# Patient Record
Sex: Female | Born: 1940 | Race: White | Hispanic: No | Smoking: Former smoker
Health system: Southern US, Community
[De-identification: ages and names within clinical notes are randomized; demographics above are authoritative.]

## PROBLEM LIST (undated history)

## (undated) DIAGNOSIS — H25019 Cortical age-related cataract, unspecified eye: Secondary | ICD-10-CM

## (undated) DIAGNOSIS — K648 Other hemorrhoids: Secondary | ICD-10-CM

## (undated) DIAGNOSIS — D126 Benign neoplasm of colon, unspecified: Secondary | ICD-10-CM

## (undated) DIAGNOSIS — G2581 Restless legs syndrome: Secondary | ICD-10-CM

## (undated) DIAGNOSIS — R634 Abnormal weight loss: Secondary | ICD-10-CM

## (undated) DIAGNOSIS — I700 Atherosclerosis of aorta: Secondary | ICD-10-CM

## (undated) DIAGNOSIS — E538 Deficiency of other specified B group vitamins: Secondary | ICD-10-CM

## (undated) DIAGNOSIS — I5022 Chronic systolic (congestive) heart failure: Secondary | ICD-10-CM

## (undated) DIAGNOSIS — I499 Cardiac arrhythmia, unspecified: Secondary | ICD-10-CM

## (undated) DIAGNOSIS — M4696 Unspecified inflammatory spondylopathy, lumbar region: Secondary | ICD-10-CM

## (undated) DIAGNOSIS — N6099 Unspecified benign mammary dysplasia of unspecified breast: Secondary | ICD-10-CM

## (undated) DIAGNOSIS — D509 Iron deficiency anemia, unspecified: Secondary | ICD-10-CM

## (undated) DIAGNOSIS — K810 Acute cholecystitis: Secondary | ICD-10-CM

## (undated) DIAGNOSIS — I2510 Atherosclerotic heart disease of native coronary artery without angina pectoris: Secondary | ICD-10-CM

## (undated) DIAGNOSIS — K219 Gastro-esophageal reflux disease without esophagitis: Secondary | ICD-10-CM

## (undated) DIAGNOSIS — K279 Peptic ulcer, site unspecified, unspecified as acute or chronic, without hemorrhage or perforation: Secondary | ICD-10-CM

## (undated) DIAGNOSIS — K921 Melena: Secondary | ICD-10-CM

## (undated) DIAGNOSIS — J309 Allergic rhinitis, unspecified: Secondary | ICD-10-CM

## (undated) DIAGNOSIS — I10 Essential (primary) hypertension: Secondary | ICD-10-CM

## (undated) DIAGNOSIS — I38 Endocarditis, valve unspecified: Secondary | ICD-10-CM

## (undated) DIAGNOSIS — M48062 Spinal stenosis, lumbar region with neurogenic claudication: Secondary | ICD-10-CM

## (undated) DIAGNOSIS — Z9981 Dependence on supplemental oxygen: Secondary | ICD-10-CM

## (undated) DIAGNOSIS — F419 Anxiety disorder, unspecified: Secondary | ICD-10-CM

## (undated) DIAGNOSIS — J449 Chronic obstructive pulmonary disease, unspecified: Secondary | ICD-10-CM

## (undated) DIAGNOSIS — M4184 Other forms of scoliosis, thoracic region: Secondary | ICD-10-CM

## (undated) DIAGNOSIS — E785 Hyperlipidemia, unspecified: Secondary | ICD-10-CM

## (undated) DIAGNOSIS — Z9889 Other specified postprocedural states: Secondary | ICD-10-CM

## (undated) DIAGNOSIS — I420 Dilated cardiomyopathy: Secondary | ICD-10-CM

## (undated) DIAGNOSIS — F32A Depression, unspecified: Secondary | ICD-10-CM

## (undated) DIAGNOSIS — M1990 Unspecified osteoarthritis, unspecified site: Secondary | ICD-10-CM

## (undated) DIAGNOSIS — R0600 Dyspnea, unspecified: Secondary | ICD-10-CM

## (undated) DIAGNOSIS — M51369 Other intervertebral disc degeneration, lumbar region without mention of lumbar back pain or lower extremity pain: Secondary | ICD-10-CM

## (undated) DIAGNOSIS — J432 Centrilobular emphysema: Secondary | ICD-10-CM

## (undated) DIAGNOSIS — M5136 Other intervertebral disc degeneration, lumbar region: Secondary | ICD-10-CM

## (undated) DIAGNOSIS — R197 Diarrhea, unspecified: Secondary | ICD-10-CM

## (undated) DIAGNOSIS — R911 Solitary pulmonary nodule: Secondary | ICD-10-CM

## (undated) DIAGNOSIS — M75100 Unspecified rotator cuff tear or rupture of unspecified shoulder, not specified as traumatic: Secondary | ICD-10-CM

## (undated) DIAGNOSIS — I447 Left bundle-branch block, unspecified: Secondary | ICD-10-CM

## (undated) DIAGNOSIS — Z955 Presence of coronary angioplasty implant and graft: Secondary | ICD-10-CM

## (undated) DIAGNOSIS — Z87891 Personal history of nicotine dependence: Secondary | ICD-10-CM

## (undated) DIAGNOSIS — K635 Polyp of colon: Secondary | ICD-10-CM

## (undated) DIAGNOSIS — M5416 Radiculopathy, lumbar region: Secondary | ICD-10-CM

## (undated) DIAGNOSIS — R7989 Other specified abnormal findings of blood chemistry: Secondary | ICD-10-CM

## (undated) HISTORY — DX: Other intervertebral disc degeneration, lumbar region: M51.36

## (undated) HISTORY — DX: Restless legs syndrome: G25.81

## (undated) HISTORY — DX: Centrilobular emphysema: J43.2

## (undated) HISTORY — DX: Melena: K92.1

## (undated) HISTORY — DX: Hyperlipidemia, unspecified: E78.5

## (undated) HISTORY — DX: Other intervertebral disc degeneration, lumbar region without mention of lumbar back pain or lower extremity pain: M51.369

## (undated) HISTORY — DX: Allergic rhinitis, unspecified: J30.9

## (undated) HISTORY — DX: Atherosclerotic heart disease of native coronary artery without angina pectoris: I25.10

## (undated) HISTORY — DX: Spinal stenosis, lumbar region with neurogenic claudication: M48.062

## (undated) HISTORY — DX: Iron deficiency anemia, unspecified: D50.9

## (undated) HISTORY — PX: COLECTOMY: SHX59

## (undated) HISTORY — DX: Other specified abnormal findings of blood chemistry: R79.89

## (undated) HISTORY — DX: Diarrhea, unspecified: R19.7

## (undated) HISTORY — DX: Radiculopathy, lumbar region: M54.16

## (undated) HISTORY — DX: Presence of coronary angioplasty implant and graft: Z95.5

## (undated) HISTORY — DX: Polyp of colon: K63.5

## (undated) HISTORY — DX: Chronic systolic (congestive) heart failure: I50.22

## (undated) HISTORY — DX: Peptic ulcer, site unspecified, unspecified as acute or chronic, without hemorrhage or perforation: K27.9

## (undated) HISTORY — PX: APPENDECTOMY: SHX54

## (undated) HISTORY — DX: Unspecified inflammatory spondylopathy, lumbar region: M46.96

## (undated) HISTORY — DX: Endocarditis, valve unspecified: I38

## (undated) HISTORY — DX: Unspecified benign mammary dysplasia of unspecified breast: N60.99

## (undated) HISTORY — PX: CORONARY ANGIOPLASTY WITH STENT PLACEMENT: SHX49

## (undated) HISTORY — DX: Unspecified rotator cuff tear or rupture of unspecified shoulder, not specified as traumatic: M75.100

## (undated) HISTORY — PX: TONSILLECTOMY: SUR1361

## (undated) HISTORY — PX: ABDOMINAL HYSTERECTOMY: SHX81

## (undated) HISTORY — DX: Essential (primary) hypertension: I10

---

## 2004-03-16 ENCOUNTER — Ambulatory Visit: Payer: Self-pay

## 2005-05-23 ENCOUNTER — Ambulatory Visit: Payer: Self-pay | Admitting: Family Medicine

## 2005-10-30 ENCOUNTER — Ambulatory Visit: Payer: Self-pay | Admitting: Ophthalmology

## 2005-12-18 ENCOUNTER — Ambulatory Visit: Payer: Self-pay | Admitting: Ophthalmology

## 2006-03-05 ENCOUNTER — Ambulatory Visit: Payer: Self-pay | Admitting: Unknown Physician Specialty

## 2006-05-23 ENCOUNTER — Ambulatory Visit: Payer: Self-pay | Admitting: Unknown Physician Specialty

## 2006-07-19 ENCOUNTER — Ambulatory Visit: Payer: Self-pay | Admitting: Internal Medicine

## 2007-07-30 ENCOUNTER — Ambulatory Visit: Payer: Self-pay | Admitting: Internal Medicine

## 2007-08-14 ENCOUNTER — Ambulatory Visit: Payer: Self-pay | Admitting: Unknown Physician Specialty

## 2008-03-23 ENCOUNTER — Ambulatory Visit: Payer: Self-pay

## 2008-06-09 DIAGNOSIS — Z955 Presence of coronary angioplasty implant and graft: Secondary | ICD-10-CM

## 2008-06-09 HISTORY — DX: Presence of coronary angioplasty implant and graft: Z95.5

## 2008-07-06 ENCOUNTER — Inpatient Hospital Stay: Payer: Self-pay | Admitting: Cardiology

## 2008-07-06 HISTORY — PX: CORONARY ANGIOPLASTY WITH STENT PLACEMENT: SHX49

## 2008-09-15 ENCOUNTER — Ambulatory Visit: Payer: Self-pay | Admitting: Internal Medicine

## 2008-09-24 ENCOUNTER — Ambulatory Visit: Payer: Self-pay | Admitting: Internal Medicine

## 2008-10-21 ENCOUNTER — Ambulatory Visit: Payer: Self-pay | Admitting: Unknown Physician Specialty

## 2008-10-30 ENCOUNTER — Ambulatory Visit: Payer: Self-pay | Admitting: Unknown Physician Specialty

## 2008-11-03 ENCOUNTER — Emergency Department: Payer: Self-pay | Admitting: Emergency Medicine

## 2008-11-19 ENCOUNTER — Encounter: Payer: Self-pay | Admitting: Physician Assistant

## 2008-12-09 ENCOUNTER — Encounter: Payer: Self-pay | Admitting: Physician Assistant

## 2009-01-09 ENCOUNTER — Encounter: Payer: Self-pay | Admitting: Physician Assistant

## 2009-02-09 ENCOUNTER — Encounter: Payer: Self-pay | Admitting: Physician Assistant

## 2009-12-14 ENCOUNTER — Ambulatory Visit: Payer: Self-pay | Admitting: Internal Medicine

## 2009-12-15 ENCOUNTER — Ambulatory Visit: Payer: Self-pay | Admitting: Otolaryngology

## 2010-01-31 ENCOUNTER — Encounter: Payer: Self-pay | Admitting: Otolaryngology

## 2010-02-09 ENCOUNTER — Encounter: Payer: Self-pay | Admitting: Otolaryngology

## 2010-11-14 ENCOUNTER — Emergency Department: Payer: Self-pay | Admitting: Emergency Medicine

## 2011-01-10 HISTORY — PX: PARTIAL COLECTOMY: SHX5273

## 2011-05-15 ENCOUNTER — Ambulatory Visit: Payer: Self-pay | Admitting: Unknown Physician Specialty

## 2011-05-17 LAB — PATHOLOGY REPORT

## 2011-06-28 ENCOUNTER — Ambulatory Visit: Payer: Self-pay | Admitting: Surgery

## 2011-06-28 LAB — CBC
HGB: 14 g/dL (ref 12.0–16.0)
MCHC: 33.5 g/dL (ref 32.0–36.0)
MCV: 88 fL (ref 80–100)

## 2011-06-28 LAB — BASIC METABOLIC PANEL
BUN: 16 mg/dL (ref 7–18)
Chloride: 106 mmol/L (ref 98–107)
Co2: 28 mmol/L (ref 21–32)
Creatinine: 0.91 mg/dL (ref 0.60–1.30)
EGFR (African American): >60
Glucose: 107 mg/dL — ABNORMAL HIGH (ref 65–99)
Osmolality: 285 (ref 275–301)
Sodium: 142 mmol/L (ref 136–145)

## 2011-07-07 ENCOUNTER — Inpatient Hospital Stay: Payer: Self-pay | Admitting: Surgery

## 2011-07-08 LAB — BASIC METABOLIC PANEL
Anion Gap: 7 (ref 7–16)
BUN: 9 mg/dL (ref 7–18)
Calcium, Total: 8.4 mg/dL — ABNORMAL LOW (ref 8.5–10.1)
Co2: 27 mmol/L (ref 21–32)
Creatinine: 0.87 mg/dL (ref 0.60–1.30)
EGFR (African American): >60
EGFR (Non-African Amer.): >60
Glucose: 137 mg/dL — ABNORMAL HIGH (ref 65–99)
Potassium: 4.1 mmol/L (ref 3.5–5.1)
Sodium: 135 mmol/L — ABNORMAL LOW (ref 136–145)

## 2011-07-08 LAB — CBC WITH DIFFERENTIAL/PLATELET
Eosinophil #: 0 10*3/uL (ref 0.0–0.7)
Eosinophil %: 0 %
HCT: 40.1 % (ref 35.0–47.0)
Lymphocyte #: 1.6 10*3/uL (ref 1.0–3.6)
Lymphocyte %: 17 %
MCH: 29.6 pg (ref 26.0–34.0)
MCV: 88 fL (ref 80–100)
Monocyte #: 1 x10 3/mm — ABNORMAL HIGH (ref 0.2–0.9)
Neutrophil %: 72.2 %
Platelet: 220 10*3/uL (ref 150–440)
RBC: 4.58 10*6/uL (ref 3.80–5.20)
WBC: 9.2 10*3/uL (ref 3.6–11.0)

## 2011-07-11 LAB — BASIC METABOLIC PANEL
Anion Gap: 7 (ref 7–16)
BUN: 17 mg/dL (ref 7–18)
Co2: 26 mmol/L (ref 21–32)
EGFR (African American): 58 — ABNORMAL LOW
EGFR (Non-African Amer.): 50 — ABNORMAL LOW
Glucose: 112 mg/dL — ABNORMAL HIGH (ref 65–99)
Osmolality: 263 (ref 275–301)
Potassium: 5 mmol/L (ref 3.5–5.1)
Sodium: 130 mmol/L — ABNORMAL LOW (ref 136–145)

## 2011-07-11 LAB — CBC WITH DIFFERENTIAL/PLATELET
Basophil #: 0.1 10*3/uL (ref 0.0–0.1)
Eosinophil %: 0.9 %
HCT: 46.5 % (ref 35.0–47.0)
HGB: 15.4 g/dL (ref 12.0–16.0)
Lymphocyte %: 17 %
MCH: 28.8 pg (ref 26.0–34.0)
MCHC: 33 g/dL (ref 32.0–36.0)
MCV: 87 fL (ref 80–100)
Monocyte %: 8.2 %
Neutrophil #: 8.2 10*3/uL — ABNORMAL HIGH (ref 1.4–6.5)
Neutrophil %: 73.4 %
Platelet: 340 10*3/uL (ref 150–440)
RDW: 13.4 % (ref 11.5–14.5)

## 2011-07-11 LAB — PATHOLOGY REPORT

## 2011-07-14 LAB — BASIC METABOLIC PANEL
Anion Gap: 5 — ABNORMAL LOW (ref 7–16)
BUN: 14 mg/dL (ref 7–18)
Chloride: 98 mmol/L (ref 98–107)
Co2: 31 mmol/L (ref 21–32)
Creatinine: 0.87 mg/dL (ref 0.60–1.30)
Glucose: 121 mg/dL — ABNORMAL HIGH (ref 65–99)
Sodium: 134 mmol/L — ABNORMAL LOW (ref 136–145)

## 2011-07-15 LAB — CBC WITH DIFFERENTIAL/PLATELET
Basophil #: 0 10*3/uL (ref 0.0–0.1)
Basophil %: 0.1 %
Eosinophil %: 1.7 %
HGB: 11.9 g/dL — ABNORMAL LOW (ref 12.0–16.0)
Lymphocyte #: 1.5 10*3/uL (ref 1.0–3.6)
Lymphocyte %: 21.1 %
MCH: 29.5 pg (ref 26.0–34.0)
MCV: 87 fL (ref 80–100)
Monocyte %: 9.7 %
Neutrophil %: 67.4 %
Platelet: 302 10*3/uL (ref 150–440)
RBC: 4.05 10*6/uL (ref 3.80–5.20)
WBC: 7.1 10*3/uL (ref 3.6–11.0)

## 2011-07-17 LAB — POTASSIUM: Potassium: 3.9 mmol/L (ref 3.5–5.1)

## 2011-10-12 ENCOUNTER — Ambulatory Visit: Payer: Self-pay | Admitting: Internal Medicine

## 2011-10-24 ENCOUNTER — Ambulatory Visit: Payer: Self-pay | Admitting: Internal Medicine

## 2011-11-20 ENCOUNTER — Ambulatory Visit: Payer: Self-pay | Admitting: Surgery

## 2011-11-20 HISTORY — PX: BREAST BIOPSY: SHX20

## 2012-05-20 ENCOUNTER — Ambulatory Visit: Payer: Self-pay | Admitting: Surgery

## 2013-05-13 ENCOUNTER — Inpatient Hospital Stay: Payer: Self-pay | Admitting: Internal Medicine

## 2013-05-14 LAB — CBC WITH DIFFERENTIAL/PLATELET
BASOS PCT: 1 %
Basophil #: 0.1 10*3/uL (ref 0.0–0.1)
EOS ABS: 0.1 10*3/uL (ref 0.0–0.7)
Eosinophil %: 1.2 %
HCT: 29.4 % — ABNORMAL LOW (ref 35.0–47.0)
HGB: 9.4 g/dL — ABNORMAL LOW (ref 12.0–16.0)
LYMPHS ABS: 2.4 10*3/uL (ref 1.0–3.6)
LYMPHS PCT: 40.2 %
MCH: 20.9 pg — AB (ref 26.0–34.0)
MCHC: 31.9 g/dL — ABNORMAL LOW (ref 32.0–36.0)
MCV: 65 fL — ABNORMAL LOW (ref 80–100)
MONOS PCT: 11.6 %
Monocyte #: 0.7 x10 3/mm (ref 0.2–0.9)
NEUTROS PCT: 46 %
Neutrophil #: 2.8 10*3/uL (ref 1.4–6.5)
PLATELETS: 325 10*3/uL (ref 150–440)
RBC: 4.5 10*6/uL (ref 3.80–5.20)
RDW: 28.4 % — ABNORMAL HIGH (ref 11.5–14.5)
WBC: 6.1 10*3/uL (ref 3.6–11.0)

## 2013-05-14 LAB — COMPREHENSIVE METABOLIC PANEL
ANION GAP: 7 (ref 7–16)
AST: 22 U/L (ref 15–37)
Albumin: 3.7 g/dL (ref 3.4–5.0)
Alkaline Phosphatase: 83 U/L
BUN: 10 mg/dL (ref 7–18)
Bilirubin,Total: 1.2 mg/dL — ABNORMAL HIGH (ref 0.2–1.0)
CHLORIDE: 112 mmol/L — AB (ref 98–107)
CO2: 23 mmol/L (ref 21–32)
CREATININE: 0.95 mg/dL (ref 0.60–1.30)
Calcium, Total: 8.9 mg/dL (ref 8.5–10.1)
EGFR (African American): >60
EGFR (Non-African Amer.): 60 — ABNORMAL LOW
Glucose: 94 mg/dL (ref 65–99)
OSMOLALITY: 282 (ref 275–301)
Potassium: 3.7 mmol/L (ref 3.5–5.1)
SGPT (ALT): 19 U/L (ref 12–78)
Sodium: 142 mmol/L (ref 136–145)
Total Protein: 6.7 g/dL (ref 6.4–8.2)

## 2013-05-15 LAB — CBC WITH DIFFERENTIAL/PLATELET
Basophil #: 0.1 10*3/uL (ref 0.0–0.1)
Basophil %: 1 %
Eosinophil #: 0.1 10*3/uL (ref 0.0–0.7)
Eosinophil %: 2.1 %
HCT: 30.5 % — ABNORMAL LOW (ref 35.0–47.0)
HGB: 9.2 g/dL — ABNORMAL LOW (ref 12.0–16.0)
LYMPHS ABS: 2 10*3/uL (ref 1.0–3.6)
LYMPHS PCT: 30.8 %
MCH: 20 pg — ABNORMAL LOW (ref 26.0–34.0)
MCHC: 30.3 g/dL — AB (ref 32.0–36.0)
MCV: 66 fL — AB (ref 80–100)
MONOS PCT: 13.2 %
Monocyte #: 0.8 x10 3/mm (ref 0.2–0.9)
NEUTROS ABS: 3.4 10*3/uL (ref 1.4–6.5)
NEUTROS PCT: 52.9 %
Platelet: 305 10*3/uL (ref 150–440)
RBC: 4.62 10*6/uL (ref 3.80–5.20)
RDW: 28.9 % — AB (ref 11.5–14.5)
WBC: 6.4 10*3/uL (ref 3.6–11.0)

## 2013-05-16 LAB — CBC WITH DIFFERENTIAL/PLATELET
BASOS ABS: 0 10*3/uL (ref 0.0–0.1)
Basophil %: 0.9 %
EOS ABS: 0.1 10*3/uL (ref 0.0–0.7)
EOS PCT: 2.6 %
HCT: 31.1 % — ABNORMAL LOW (ref 35.0–47.0)
HGB: 9.5 g/dL — ABNORMAL LOW (ref 12.0–16.0)
LYMPHS PCT: 33 %
Lymphocyte #: 1.7 10*3/uL (ref 1.0–3.6)
MCH: 20.4 pg — ABNORMAL LOW (ref 26.0–34.0)
MCHC: 30.7 g/dL — ABNORMAL LOW (ref 32.0–36.0)
MCV: 66 fL — ABNORMAL LOW (ref 80–100)
Monocyte #: 0.6 x10 3/mm (ref 0.2–0.9)
Monocyte %: 12.5 %
NEUTROS ABS: 2.6 10*3/uL (ref 1.4–6.5)
NEUTROS PCT: 51 %
Platelet: 293 10*3/uL (ref 150–440)
RBC: 4.68 10*6/uL (ref 3.80–5.20)
RDW: 29.5 % — ABNORMAL HIGH (ref 11.5–14.5)
WBC: 5 10*3/uL (ref 3.6–11.0)

## 2013-07-04 ENCOUNTER — Ambulatory Visit: Payer: Self-pay | Admitting: Oncology

## 2013-07-04 LAB — CBC CANCER CENTER
BASOS ABS: 0.1 x10 3/mm (ref 0.0–0.1)
Basophil %: 0.8 %
EOS PCT: 0.7 %
Eosinophil #: 0.1 x10 3/mm (ref 0.0–0.7)
HCT: 39.1 % (ref 35.0–47.0)
HGB: 12.4 g/dL (ref 12.0–16.0)
LYMPHS ABS: 1.9 x10 3/mm (ref 1.0–3.6)
Lymphocyte %: 25.7 %
MCH: 24.9 pg — AB (ref 26.0–34.0)
MCHC: 31.7 g/dL — ABNORMAL LOW (ref 32.0–36.0)
MCV: 79 fL — AB (ref 80–100)
MONOS PCT: 7.8 %
Monocyte #: 0.6 x10 3/mm (ref 0.2–0.9)
NEUTROS ABS: 4.9 x10 3/mm (ref 1.4–6.5)
Neutrophil %: 65 %
Platelet: 278 x10 3/mm (ref 150–440)
RBC: 4.98 10*6/uL (ref 3.80–5.20)
RDW: 28.1 % — ABNORMAL HIGH (ref 11.5–14.5)
WBC: 7.6 x10 3/mm (ref 3.6–11.0)

## 2013-07-04 LAB — COMPREHENSIVE METABOLIC PANEL
ALK PHOS: 84 U/L
AST: 13 U/L — AB (ref 15–37)
Albumin: 4.3 g/dL (ref 3.4–5.0)
Anion Gap: 9 (ref 7–16)
BUN: 14 mg/dL (ref 7–18)
Bilirubin,Total: 0.4 mg/dL (ref 0.2–1.0)
CREATININE: 0.86 mg/dL (ref 0.60–1.30)
Calcium, Total: 9.3 mg/dL (ref 8.5–10.1)
Chloride: 105 mmol/L (ref 98–107)
Co2: 27 mmol/L (ref 21–32)
EGFR (African American): >60
Glucose: 98 mg/dL (ref 65–99)
OSMOLALITY: 282 (ref 275–301)
Potassium: 4.5 mmol/L (ref 3.5–5.1)
SGPT (ALT): 24 U/L (ref 12–78)
Sodium: 141 mmol/L (ref 136–145)
TOTAL PROTEIN: 7.5 g/dL (ref 6.4–8.2)

## 2013-07-04 LAB — RETICULOCYTES
Absolute Retic Count: 0.0604 10*6/uL (ref 0.019–0.186)
Reticulocyte: 1.21 % (ref 0.4–3.1)

## 2013-07-04 LAB — IRON AND TIBC
IRON SATURATION: 10 %
Iron Bind.Cap.(Total): 464 ug/dL — ABNORMAL HIGH (ref 250–450)
Iron: 48 ug/dL — ABNORMAL LOW (ref 50–170)
Unbound Iron-Bind.Cap.: 416 ug/dL

## 2013-07-04 LAB — LACTATE DEHYDROGENASE: LDH: 180 U/L (ref 81–246)

## 2013-07-09 ENCOUNTER — Ambulatory Visit: Payer: Self-pay | Admitting: Oncology

## 2013-08-09 ENCOUNTER — Ambulatory Visit: Payer: Self-pay | Admitting: Oncology

## 2013-08-20 LAB — CBC CANCER CENTER
Basophil #: 0 x10 3/mm (ref 0.0–0.1)
Basophil %: 0.6 %
EOS ABS: 0.1 x10 3/mm (ref 0.0–0.7)
Eosinophil %: 1.4 %
HCT: 39.8 % (ref 35.0–47.0)
HGB: 12.7 g/dL (ref 12.0–16.0)
LYMPHS ABS: 2.3 x10 3/mm (ref 1.0–3.6)
Lymphocyte %: 41.4 %
MCH: 26.3 pg (ref 26.0–34.0)
MCHC: 31.8 g/dL — ABNORMAL LOW (ref 32.0–36.0)
MCV: 83 fL (ref 80–100)
MONO ABS: 0.4 x10 3/mm (ref 0.2–0.9)
Monocyte %: 7.7 %
Neutrophil #: 2.8 x10 3/mm (ref 1.4–6.5)
Neutrophil %: 48.9 %
Platelet: 214 x10 3/mm (ref 150–440)
RBC: 4.82 10*6/uL (ref 3.80–5.20)
RDW: 15.4 % — ABNORMAL HIGH (ref 11.5–14.5)
WBC: 5.6 x10 3/mm (ref 3.6–11.0)

## 2013-09-09 ENCOUNTER — Ambulatory Visit: Payer: Self-pay | Admitting: Oncology

## 2013-09-18 ENCOUNTER — Ambulatory Visit: Payer: Self-pay | Admitting: Internal Medicine

## 2013-10-27 ENCOUNTER — Inpatient Hospital Stay: Payer: Self-pay | Admitting: Internal Medicine

## 2013-10-27 LAB — COMPREHENSIVE METABOLIC PANEL
ALT: 27 U/L
Albumin: 3.7 g/dL (ref 3.4–5.0)
Alkaline Phosphatase: 87 U/L
Anion Gap: 11 (ref 7–16)
BILIRUBIN TOTAL: 0.5 mg/dL (ref 0.2–1.0)
BUN: 15 mg/dL (ref 7–18)
CHLORIDE: 106 mmol/L (ref 98–107)
Calcium, Total: 8.2 mg/dL — ABNORMAL LOW (ref 8.5–10.1)
Co2: 19 mmol/L — ABNORMAL LOW (ref 21–32)
Creatinine: 1.18 mg/dL (ref 0.60–1.30)
Glucose: 308 mg/dL — ABNORMAL HIGH (ref 65–99)
Osmolality: 284 (ref 275–301)
Potassium: 4.4 mmol/L (ref 3.5–5.1)
SGOT(AST): 24 U/L (ref 15–37)
Sodium: 136 mmol/L (ref 136–145)
Total Protein: 6.5 g/dL (ref 6.4–8.2)

## 2013-10-27 LAB — CBC
HCT: 40.2 % (ref 35.0–47.0)
HGB: 12.4 g/dL (ref 12.0–16.0)
MCH: 25.6 pg — AB (ref 26.0–34.0)
MCHC: 30.8 g/dL — ABNORMAL LOW (ref 32.0–36.0)
MCV: 83 fL (ref 80–100)
PLATELETS: 290 10*3/uL (ref 150–440)
RBC: 4.83 10*6/uL (ref 3.80–5.20)
RDW: 15.9 % — ABNORMAL HIGH (ref 11.5–14.5)
WBC: 11.5 10*3/uL — ABNORMAL HIGH (ref 3.6–11.0)

## 2013-10-27 LAB — HEMOGLOBIN A1C: HEMOGLOBIN A1C: 5.9 % (ref 4.2–6.3)

## 2013-10-27 LAB — TROPONIN I: Troponin-I: 0.02 ng/mL

## 2013-10-29 LAB — CBC WITH DIFFERENTIAL/PLATELET
Basophil #: 0 10*3/uL (ref 0.0–0.1)
Basophil %: 0.1 %
Eosinophil #: 0 10*3/uL (ref 0.0–0.7)
Eosinophil %: 0 %
HCT: 36.4 % (ref 35.0–47.0)
HGB: 11.7 g/dL — AB (ref 12.0–16.0)
Lymphocyte #: 1.1 10*3/uL (ref 1.0–3.6)
Lymphocyte %: 12 %
MCH: 26.2 pg (ref 26.0–34.0)
MCHC: 32.1 g/dL (ref 32.0–36.0)
MCV: 82 fL (ref 80–100)
MONO ABS: 0.5 x10 3/mm (ref 0.2–0.9)
Monocyte %: 4.9 %
NEUTROS ABS: 7.8 10*3/uL — AB (ref 1.4–6.5)
NEUTROS PCT: 83 %
Platelet: 215 10*3/uL (ref 150–440)
RBC: 4.46 10*6/uL (ref 3.80–5.20)
RDW: 15.7 % — ABNORMAL HIGH (ref 11.5–14.5)
WBC: 9.4 10*3/uL (ref 3.6–11.0)

## 2013-10-29 LAB — BASIC METABOLIC PANEL
Anion Gap: 6 — ABNORMAL LOW (ref 7–16)
BUN: 22 mg/dL — AB (ref 7–18)
CALCIUM: 8.5 mg/dL (ref 8.5–10.1)
CREATININE: 0.87 mg/dL (ref 0.60–1.30)
Chloride: 109 mmol/L — ABNORMAL HIGH (ref 98–107)
Co2: 25 mmol/L (ref 21–32)
EGFR (African American): >60
GLUCOSE: 135 mg/dL — AB (ref 65–99)
Osmolality: 285 (ref 275–301)
Potassium: 4.2 mmol/L (ref 3.5–5.1)
Sodium: 140 mmol/L (ref 136–145)

## 2013-10-30 LAB — CBC WITH DIFFERENTIAL/PLATELET
Basophil #: 0 10*3/uL (ref 0.0–0.1)
Basophil %: 0 %
EOS ABS: 0 10*3/uL (ref 0.0–0.7)
Eosinophil %: 0 %
HCT: 38.6 % (ref 35.0–47.0)
HGB: 12.5 g/dL (ref 12.0–16.0)
Lymphocyte #: 1 10*3/uL (ref 1.0–3.6)
Lymphocyte %: 12.2 %
MCH: 26.5 pg (ref 26.0–34.0)
MCHC: 32.5 g/dL (ref 32.0–36.0)
MCV: 82 fL (ref 80–100)
MONO ABS: 0.2 x10 3/mm (ref 0.2–0.9)
Monocyte %: 2.5 %
Neutrophil #: 7.2 10*3/uL — ABNORMAL HIGH (ref 1.4–6.5)
Neutrophil %: 85.3 %
Platelet: 235 10*3/uL (ref 150–440)
RBC: 4.72 10*6/uL (ref 3.80–5.20)
RDW: 15.9 % — ABNORMAL HIGH (ref 11.5–14.5)
WBC: 8.4 10*3/uL (ref 3.6–11.0)

## 2013-10-30 LAB — BASIC METABOLIC PANEL
Anion Gap: 7 (ref 7–16)
BUN: 22 mg/dL — AB (ref 7–18)
CHLORIDE: 110 mmol/L — AB (ref 98–107)
CO2: 25 mmol/L (ref 21–32)
Calcium, Total: 8.7 mg/dL (ref 8.5–10.1)
Creatinine: 0.9 mg/dL (ref 0.60–1.30)
EGFR (Non-African Amer.): >60
Glucose: 137 mg/dL — ABNORMAL HIGH (ref 65–99)
OSMOLALITY: 289 (ref 275–301)
POTASSIUM: 4.3 mmol/L (ref 3.5–5.1)
Sodium: 142 mmol/L (ref 136–145)

## 2014-02-09 DIAGNOSIS — E538 Deficiency of other specified B group vitamins: Secondary | ICD-10-CM | POA: Insufficient documentation

## 2014-04-01 ENCOUNTER — Emergency Department: Payer: Self-pay | Admitting: Emergency Medicine

## 2014-04-01 DIAGNOSIS — R911 Solitary pulmonary nodule: Secondary | ICD-10-CM | POA: Insufficient documentation

## 2014-04-01 LAB — CBC
HCT: 35.5 % (ref 35.0–47.0)
HGB: 10.9 g/dL — ABNORMAL LOW (ref 12.0–16.0)
MCH: 23 pg — ABNORMAL LOW (ref 26.0–34.0)
MCHC: 30.7 g/dL — AB (ref 32.0–36.0)
MCV: 75 fL — ABNORMAL LOW (ref 80–100)
Platelet: 334 10*3/uL (ref 150–440)
RBC: 4.75 10*6/uL (ref 3.80–5.20)
RDW: 16.3 % — ABNORMAL HIGH (ref 11.5–14.5)
WBC: 7.4 10*3/uL (ref 3.6–11.0)

## 2014-04-01 LAB — URINALYSIS, COMPLETE
BLOOD: NEGATIVE
Bacteria: NONE SEEN
Bilirubin,UR: NEGATIVE
Glucose,UR: NEGATIVE mg/dL (ref 0–75)
Ketone: NEGATIVE
LEUKOCYTE ESTERASE: NEGATIVE
NITRITE: NEGATIVE
PH: 8 (ref 4.5–8.0)
Protein: NEGATIVE
RBC,UR: NONE SEEN /HPF (ref 0–5)
SPECIFIC GRAVITY: 1.003 (ref 1.003–1.030)
SQUAMOUS EPITHELIAL: NONE SEEN
WBC UR: <1 /HPF (ref 0–5)

## 2014-04-28 NOTE — Discharge Summary (Signed)
PATIENT NAME:  [name], Tanya Rhodes MR#:  [id number] DATE OF BIRTH:  [birth year]  DATE OF ADMISSION:  07/07/2011 DATE OF DISCHARGE:  07/18/2011  HISTORY OF PRESENT ILLNESS: This 74 year old female came in due to colonic polyps. She had recently had colonoscopy demonstrating a tubular adenomas of the cecum, hepatic flexure and splenic flexure which could not be resected with the colonoscope and resection of these segments of the colon was recommended for definitive treatment.   PAST MEDICAL HISTORY:  1. Chronic obstructive pulmonary disease. 2. History of coronary artery disease. 3. Hypercholesterolemia. 4. Restless legs. 5. Allergic rhinitis.   NOTE: Other details of the history recorded on the typed history and physical.   HOSPITAL COURSE: She was admitted through the outpatient surgery department, had a laparoscopic resection of the colon from the cecum to the descending colon. He did have preop prophylactic antibiotic, was treated with subcutaneous heparin prophylaxis and was treated postoperatively also with IV fluids. She was begun with a clear liquid diet and later advanced to full liquids and did demonstrate some bowel function and was advanced to solid food.   Her pathology demonstrated five tubular adenomas, the largest measured 1.5 cm in dimension. All of these were benign.   It is further noted during the course of hospitalization she did develop watery diarrhea and stool sample was sent and was positive for Clostridium difficile. She was treated initially with Flagyl but did not appear to substantially improve and therefore was subsequently treated with vancomycin which she improved fairly promptly with the vancomycin.   FINAL DIAGNOSES:  1. Five tubular adenomas of the colon.  2. Clostridium difficile colitis.   OPERATION: Laparoscopic resection of right transverse and descending colon.   DISCHARGE INSTRUCTIONS: Discharge instructions were given. She will return home. Take Tylenol  if needed. Take vancomycin 250 mg take 4 daily. Follow up in the office.   ____________________________ J. Renda RollsWilton Lark Runk, MD jws:cms D: 07/29/2011 13:53:37 ET T: 07/31/2011 08:25:18 ET JOB#: [id number]  cc: Adella HareJ. Wilton Garison Genova, MD, <Dictator>  Adella HareWILTON J Pearl Berlinger MD ELECTRONICALLY SIGNED 08/02/2011 7:38

## 2014-05-02 NOTE — Consult Note (Signed)
Details:   - EGD and Colon done today.    Both were normal studies ( IC anastaomis of colon).  No source for bleeding seen.   Will place capusle endoscopy to visualize small bowel.   Recs: ferrous sulfate 325 BID follow hgb, iron studies as outpatient.   Electronic Signatures: Dow Adolphein, Candelaria Pies (MD)  (Signed 07-May-15 12:25)  Authored: Details   Last Updated: 07-May-15 12:25 by Dow Adolphein, Taura Lamarre (MD)

## 2014-05-02 NOTE — Discharge Summary (Signed)
PATIENT NAME:  [name], Rima F MR#:  [id number] DATE OF BIRTH:  Aug 26, 1940  DATE OF ADMISSION:  10/27/2013 DATE OF DISCHARGE:  10/30/2013  DIAGNOSES AT TIME OF DISCHARGE: 1.  Acute on chronic respiratory failure secondary to chronic obstructive pulmonary disease exacerbation.  2.  Bronchitis.  3.  History of coronary artery disease.  4.  Hyperlipidemia.  5.  Hypertension.  6.  Gastroesophageal reflux disease.   CHIEF COMPLAINT: Shortness of breath, chest tightness.   HISTORY OF PRESENT ILLNESS: Susy ManorCarolyn Papesh is a 74 year old female with a history of COPD, CAD, hyperlipidemia who presented to the ED complaining of shortness of breath for about 2 days' duration. The patient used an inhaler but continued to be short of breath and came to the ED. She was placed on BiPAP initially and received IV Solu-Medrol, and her oxygen saturations improved to between 97% and 99% on 2 L nasal cannula.   PAST MEDICAL HISTORY: Significant for COPD, hyperlipidemia, peripheral neuropathy. Please see H and P for other details.  SOCIAL HISTORY: She is an ex-smoker.   PHYSICAL EXAMINATION:  GENERAL: She was alert and oriented, tachycardic. VITAL SIGNS: Heart rate was 120, blood pressure 98/60, oxygen saturation 97% on room air.  HEENT: NCAT.  HEART: S1, S2.  LUNGS: Decreased air entry bilaterally.  ABDOMEN: Soft, nontender.  EXTREMITIES: No edema.  IMAGING: Chest x-ray showed evidence of hyperexpanded lung fields consistent with COPD. EKG showed evidence of sinus tachycardia.  LABORATORY DATA: Hemoglobin is 12.4, hematocrit 40.2, platelets 290,000. Glucose was 308, BUN 15, creatinine 1.18. Sodium 136, potassium 4.4, chloride 106, bicarbonate 19, troponin was 0.02.   HOSPITAL COURSE: The patient was [facility] and started on intravenous Solu-Medrol. She also received DuoNebs and oral inhalers. The Solu-Medrol was gradually weaned off. Blood sugars were high initially from the steroids but  subsequently improved. She was also seen by physical therapy. Her oxygen was weaned off and oxygen saturations remained stable, and she was switched to p.o. prednisone as an outpatient. The patient was discharged in stable condition on the following medications.  DISCHARGE MEDICATIONS: Simvastatin 20 mg once a day; tramadol 50 mg 1 to 2 tabs every 4 to 6 hours as needed; Topamax 50 mg once a day at bedtime; gabapentin 300 mg 2 capsules once a day at bedtime; ProAir inhaler 2 puffs every 4 hours as needed; fluticasone nasal spray 2 sprays once a day at bedtime; spironolactone 25 mg once a day; vitamin B12, 1000 mcg once a day; ranitidine 150 mg p.o. b.i.d.; prednisone taper starting at 40 mg a day for 3 days then decrease by 10 mg every 3 days until gone; Symbicort inhaler 160/4.5, 2 puffs b.i.d.;  Avelox 400 mg q. daily for 7 days.   FOLLOWUP: The patient was advised to follow up with me, Dr. Marcello FennelHande, and also follow up with Dr. Meredeth IdeFleming as an outpatient. She has been advised to call the office with any questions or concerns.  CONDITION AT DISCHARGE: She was stable at the time of discharge.  TOTAL TIME SPENT ON DISCHARGING THIS PATIENT: 35 minutes.    ____________________________ Barbette ReichmannVishwanath Evanthia Maund, MD vh:ST D: 10/30/2013 13:06:26 ET T: 10/31/2013 01:22:03 ET JOB#: [id number]  cc: Barbette ReichmannVishwanath Banyan Goodchild, MD, <Dictator> Barbette ReichmannVISHWANATH Shelvy Heckert MD ELECTRONICALLY SIGNED 11/04/2013 15:57

## 2014-05-02 NOTE — H&P (Signed)
PATIENT NAME:  Tanya Rhodes, Tanya Rhodes MR#:  [id number] DATE OF BIRTH:  09-Jul-1940  DATE OF ADMISSION:  10/27/2013  PRESENTING COMPLAINT: Shortness of breath, chest tightness.   HISTORY OF PRESENT ILLNESS: Tanya Rhodes is a 74 year old Caucasian female who is a former smoker, history of COPD, CAD, hypercholesterolemia, who comes to the Emergency Room after she started noticing some shortness of breath for the last couple days. The patient went picking some persimmons in the yard and she bent down and thereafter when she got up, she felt very short of breath, went inside, took an inhaler, it did not help and so came to the Emergency Room. She was placed on the BiPAP initially received IV Solu-Medrol and breathing treatments. Currently she is on 2 liters nasal cannula satting around 97% to 99%, and is feeling much better. She is somewhat tachycardic and is being admitted for further evaluation and management of her acute on chronic respiratory failure secondary to COPD exacerbation.   ALLERGIES: REQUIP.    MEDICATIONS:  1. Tramadol 50 mg 1-2 every 6 hours as needed.  2. Topamax 50 mg 1 tablet at bedtime.  3. spironolactone 50 mg daily.  4. Simvastatin 20 mg daily at bedtime.  5. Ranitidine 150 mg b.i.d.  6. ProAir HFA 2 puffs inhalation every 4 hours as needed.  7. Gabapentin 300 mg 2 capsules once a day at bedtime.  8. Fluticasone nasal spray 2 sprays once a day.  9. Cyanocobalamin 1000 mcg p.o. daily at bedtime.   FAMILY HISTORY: Mother with blood clot, brother with melanoma, nephew with stroke and hypertension.   SOCIAL HISTORY: She is widowed, retired from [employer]. No alcohol or prior tobacco abuse.   REVIEW OF SYSTEMS:  CONSTITUTIONAL: No fever, headache, fatigue.  EYES: No blurred or double vision, glaucoma, positive for cataract surgery.  ENT: No tinnitus, ear pain, hearing loss.  RESPIRATORY: No cough. Positive for COPD and dyspnea.  CARDIOVASCULAR: No chest pain, orthopnea,  edema. Positive for hypertension.  GASTROINTESTINAL: No nausea, vomiting, diarrhea, abdominal pain, or hematemesis.  GENITOURINARY: No dysuria, hematuria, frequency.  ENDOCRINE: No polyuria, nocturia, or thyroid problems.  HEMATOLOGY: No anemia or easy bruising.  SKIN: No acne, rash, or lesion.  MUSCULOSKELETAL: Positive for arthritis and cramps.  NEUROLOGIC: No CVA, TIA, or dementia.  PSYCHIATRIC: No anxiety, depression, or bipolar disorder.   All other systems reviewed and negative.   PHYSICAL EXAMINATION:  GENERAL: The patient is awake. She is alert. She is oriented x 3, not in acute distress.  VITAL SIGNS: She is tachycardic. Heart rate in the 120s. Blood pressure is 98/60, sats are 97% on 2 liters.  HEENT: Atraumatic, normocephalic. Pupils PERLA. EOM intact. Oral mucosa is moist.  NECK: Supple. No JVD. No carotid bruit.  LUNGS: Clear to distant breath sounds. No audible wheezing or rales heard. No respiratory distress or labored breathing.  CARDIOVASCULAR: Tachycardia present. No murmur heard. PMI not lateralized. Chest nontender.  EXTREMITIES: Good pedal pulses, good femoral pulses. No lower extremity edema.  ABDOMEN: Soft, benign, nontender. No organomegaly. Positive bowel sounds.  NEUROLOGIC: Grossly intact cranial nerves II through XII. No motor or sensory deficit.  PSYCHIATRIC: The patient is awake, alert, oriented x 3.   CHEST X-RAY: No acute cardiopulmonary disease. Hyperexpanded lung consistent with COPD.   LABORATORY DATA: White count is 11.5, H and H is 12.4 and 40.2, platelet count is 290,000. Glucose is 308, BUN is 15, creatinine is 1.18, sodium is 136, potassium is 4.4, chloride is 106,  bicarbonate is 19, calcium is 8.2. LFTs within normal limits. Troponin is 0.02.  EKG shows sinus tachycardia.   ASSESSMENT AND PLAN: A 74 year old Ms. Tanya Rhodes with history of chronic obstructive pulmonary disease, known hypertension, comes in with:  1. Acute on chronic obstructive  pulmonary disease exacerbation with mild hypoxia which improved after the patient received BiPAP in the Emergency Room. She is going to be admitted on the medical floor, continue IV Solu-Medrol around the clock, along with DuoNeb and oral inhalers. No indication for antibiotics. The patient does not have pneumonia or any cough or fever. White count is stable. We will change to p.o. prednisone once patient feels a little bit more better. Chest x-ray does not show any pneumonia.  2. History of coronary artery disease. No chest pain.  3. Hypercholesterolemia on simvastatin.  4. Hypertension. She takes only spironolactone. 5. Gastroesophageal reflux disease. Continue the ranitidine. 6. Deep vein thrombosis prophylaxis. Subcutaneous heparin t.i.d.  7. Further workup according to the patient's clinical course.  8. Hyperglycemia could be due to steroids. We will check hemoglobin A1c. Patient does not have a history of diabetes. If A1c is elevated, consider treating with oral diabetic medications.   Above was discussed with the patient, who is agreeable to it.   CODE STATUS: She is a full code.   TIME SPENT: 50 minutes.   ____________________________ Wylie HailSona A. Allena KatzPatel, MD sap:lt D: 10/27/2013 19:35:37 ET T: 10/27/2013 21:00:55 ET JOB#: [id number]  cc: Trudy Kory A. Allena KatzPatel, MD, <Dictator> Willow OraSONA A Errol Ala MD ELECTRONICALLY SIGNED 12/09/2013 9:12

## 2014-05-02 NOTE — Consult Note (Signed)
Details:   - GI Note:  Plan:  EGD + Colon +- capusle tomorrow.  Please give half miralax prep tonight and other half from 5 am to 7 am.  NPO after 7 am.   Electronic Signatures: Dow Adolphein, Matthew (MD)  (Signed 06-May-15 17:18)  Authored: Details   Last Updated: 06-May-15 17:18 by Dow Adolphein, Matthew (MD)

## 2014-05-02 NOTE — Consult Note (Signed)
PATIENT NAME:  Tanya Rhodes, Tanya Rhodes MR#:  [id number] DATE OF BIRTH:  09-24-1940  DATE OF CONSULTATION:  05/13/2013  REFERRING PHYSICIAN:  Dr. Dareen PianoAnderson CONSULTING PHYSICIAN:  Dow AdolphMatthew Rein, MD / Hardie ShackletonKaryn M. Keiana Tavella, PA-C  REASON FOR CONSULTATION: Microcytic anemia, symptomatic.   HISTORY OF PRESENT ILLNESS: This is a pleasant 74 year old female who was initially seen in the [facility] walk-in who was sent in as a direct admission after being found to have a significantly low hemoglobin. She initially presented with concerns of shortness of breath that has been getting progressively worse over the past few weeks. When checked her hemoglobin was 5.9 with a MCV of 60.6. The patient states that she has been moving her bowels regularly and denies any diarrhea, constipation, bright red blood per rectum or melena. She does have a history of a right hemicolectomy and does admit that she will look at her stool after she uses the restroom and she has not seen anything that looked abnormal or unusual recently. A Hemoccult blood test was checked on this patient and was negative. She does continue to report some shortness of breath and ongoing fatigue, but otherwise denies any accompanying symptoms. She has a good appetite and there have been no unintentional weight changes. There is no nausea, vomiting, dysphagia or heartburn. No abdominal pain or rectal pain. No fever or chills. No chest pain but there is shortness of breath, as mentioned above.   PAST MEDICAL HISTORY: COPD, restless leg syndrome, and colon polyps.  ALLERGIES: REQUIP.  SOCIAL HABITS: The patient has remote history of tobacco use, but denies any current alcohol, tobacco or illicit drug use.   PAST SURGICAL HISTORY: Right hemicolectomy secondary to large flat colon polyps on colonoscopy (this was removed by Dr. Katrinka BlazingSmith in 2013), appendectomy, rotator cuff repair, cataract surgery, total hysterectomy and left carpal tunnel release.   HOME MEDICATIONS:  Aspirin, Flonase, ipratropium bromide, Topamax, spironolactone, simvastatin, gabapentin, Tylenol, Pro Air.  FAMILY HISTORY: Sister was diagnosed with colon cancer. Brother has had colon polyps.   REVIEW OF SYSTEMS: A 10 system review was obtained on the patient. She does report that she was recently told she has oral thrush secondary from her COPD inhalers and does have concerns that this is an ongoing issue and was given a prescription for an antifungal that the patient has not yet filled. All other pertinent positives are mentioned above and are otherwise negative.   PHYSICAL EXAMINATION: VITAL SIGNS: Blood pressure 169/50, heart rate 93, respirations 20, temperature 97.7, bedside pulse ox 96%.  GENERAL: This is a pleasant 74 year old female resting quietly and comfortably in bed in no acute distress, alert and oriented x3.  HEAD: Atraumatic, normocephalic.  NECK: Supple. No lymphadenopathy noted.  HEENT: Sclerae anicteric. Mucous membranes moist.  LUNGS: Respirations are even and unlabored. Clear to auscultation in bilateral anterior lung fields.  HEART: Regular rate and rhythm. S1, S2 noted.  ABDOMEN: Soft, nontender, nondistended. Normoactive bowel sounds noted in all 4 quadrants. No guarding or rebound. No masses, hernias or organomegaly appreciated.  PSYCHIATRIC: Appropriate mood and affect.  NEUROLOGIC: Cranial nerves II through XII are grossly intact.  EXTREMITIES: Negative for lower extremity edema, 2+ pulses noted in bilateral upper extremities.  RECTAL: Deferred.   DIAGNOSTIC DATA: These labs were obtained in the walk-in clinic at [facility] as there have not been any labs drawn while she is inpatient quite yet. White blood cells 5.5, hemoglobin 5.9, hematocrit 21.1, platelets 394,000 MCV 60.9. Sodium 136, potassium 3.5, BUN 12,  creatinine 0.8, glucose 83. LFTs were within normal limits. She was heme-negative.  Other data: I have reviewed her previous colonoscopy report, May 15, 2011, by Dr. Lynnae Prude. She did have 3 colon polyps, 2 measuring 15 mm and 1 measuring 20 mm that were tubular adenomas. These were very large and unresectable through colonoscopy and as a result she underwent a laparoscopic right transverse and descending colon resection June of 2013 by Dr. Renda Rolls. Pathology was confirming tubular adenomas and 20 lymph nodes were negative for malignancy. She is due for a repeat in May 2015.   ASSESSMENT: 1.  Symptomatic microcytic anemia.  2.  Shortness of breath.  3.  Personal history of colon polyps that were quite large and difficult to resect endoscopically. As a result, she has had a right hemicolectomy in 2013 and she is due for a repeat this month, May 2015.  4.  Family history of colon cancer in the patient's sister. 5.  History of chronic obstructive pulmonary disease.   PLAN: I have discussed this patient's case in detail with Dr. Dow Adolph who is involved in the development of the patient's plan of care. Certainly at this time, due to her profound anemia, we do agree with transfusing her 2 units of packed red blood cells and continuing to monitor serial hemoglobins to ensure stability and being prepared to transfuse additional units if necessary. Continue clear liquid diet for now. This patient is due for a repeat colonoscopy this month and certainly we do feel she can benefit from this procedure, plus or minus an EGD, due to her profound anemia and we will determine the best time for him to order this test once the patient is hemodynamically stable. We will continue to monitor this patient throughout hospitalization and make further recommendations pending above and per clinical course.   The above was discussed and agreed upon under supervisory agreement between myself and Dr. Shelle Iron.  This services provided by Hardie Shackleton. Deshia Vanderhoof, PA-C under collaborative agreement with Dr. Dow Adolph.  ____________________________ Hardie Shackleton. Susumu Hackler,  PA-C kme:sb D: 05/13/2013 15:54:29 ET T: 05/13/2013 16:19:47 ET JOB#: [id number]  cc: Hardie Shackleton. Larinda Herter, PA-C, <Dictator> Hardie Shackleton Jaselyn Nahm PA ELECTRONICALLY SIGNED 05/20/2013 10:00

## 2014-05-02 NOTE — Discharge Summary (Signed)
PATIENT NAME:  Tanya NoonRRISH, Anabella F MR#:  [id number] DATE OF BIRTH:  Jan 14, 1940  DATE OF ADMISSION:  05/13/2013 DATE OF DISCHARGE:  05/16/2013  DISCHARGE DIAGNOSES: 1.  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  2.  Coronary artery disease, status post stents.  3.  Chronic obstructive pulmonary disease.   DISCHARGE MEDICATIONS: Per [facility] med reconciliation system. Please see for details, but briefly, we will have her stop her aspirin for a few weeks until we ensure her hemoglobin is coming up with iron when she does not continue to lose blood quickly. She will start Niferex iron preparation 150 mg twice a day.   HISTORY AND PHYSICAL: Please see detailed history and physical done on admission.   HOSPITAL COURSE: The patient was admitted with markedly low hemoglobin, under 6 in the office. She was transfused 2 units of packed red cells. Hemoglobin came up in the low to mid 9 range and remained there.  Ferritin, TIBC was done at the [facility] lab and is still pending at the time of dictation. She was microcytic, which was new, so this was almost certainly iron deficiency. Upper GI endoscopy was normal, as well as colonoscopy after GI consultation. Capsule endoscopy was done but the result is not available yesterday.  She was in stable condition and ready to go home presently.    ____________________________ Marya AmslerMarshall W. Dareen PianoAnderson, MD mwa:dmm D: 05/16/2013 08:08:38 ET T: 05/16/2013 10:45:40 ET JOB#: [id number]  cc: Marya AmslerMarshall W. Dareen PianoAnderson, MD, <Dictator> Lauro RegulusMARSHALL W Shweta Aman MD ELECTRONICALLY SIGNED 05/19/2013 6:15

## 2014-05-02 NOTE — H&P (Signed)
PATIENT NAME:  DION, PARROW MR#:  [id number] DATE OF BIRTH:  07-27-1940  DATE OF ADMISSION:  05/13/2013  CHIEF COMPLAINT: Shortness of breath.   HISTORY OF PRESENT ILLNESS: This is a 74 year old with worsening shortness of breath, especially with exertion over the last several weeks to months. She has a history of COPD, restless leg syndrome, allergic rhinitis and adenomatous colon polyps. Her hemoglobin today was 5.9 and she was admitted for further evaluation. She denies any rectal bleeding or bright red blood per rectum. She denies any melena. She denies any abdominal pain or nausea. She does have a lot of leg pain, especially in her thighs and often has cramping. She denies any chest pain, fevers or chills or cough.   PAST MEDICAL HISTORY:  1.  COPD. 2.  Allergic rhinitis. 3.  Adenomatous colon polyps, last colonoscopy 2013.  4.  Restless leg syndrome.   PAST SURGICAL HISTORY:  1.  Hysterectomy in 1982. 2.  Cataracts.  3.  Rotator cuff surgery, 2010.  4.  Carpal tunnel surgery.  5.  Appendectomy 1982.  6.  Partial colectomy 2013.  SOCIAL HISTORY: She is widowed. Retired from [employer]. No alcohol. History of prior tobacco, but quit. She has 2 kids and grandkids.   FAMILY HISTORY: Mom with blood clot. Brother with melanoma. Nephew with strokes and hypertension. Mom with Alzheimer's.   REVIEW OF SYSTEMS: As per history of present illness. She denies any headaches. No significant head congestion today. She denies any nosebleeds. She has had some soreness in her mouth since using inhalers for her COPD. She denies any chest pain, abdominal pain, nausea, vomiting or diarrhea. No new urinary symptoms. No lower extremity edema.   CURRENT MEDICATIONS:  1.  Topamax 50 mg b.i.d.  2.  Aspirin 325 mg daily.  3.  Flonase 2 sprays each nostril once a day.  4.  Spironolactone 25 mg daily.  5.  Simvastatin 20 mg at bedtime.  6.  Gabapentin 600 mg at bedtime.  7.  Tylenol 650 mg  as needed.  8.  ProAir 1 to 2 puffs q.6 hours p.r.n.  9.  Dulara 2 puffs b.i.d.  10. Omeprazole 40 mg daily.  11. Tramadol 50 mg p.r.n.  12. Vitamin B12 1000 mcg daily.  13. Vitamin D 1000 units daily.   PHYSICAL EXAMINATION:  GENERAL: Pale appearing, elderly female in no acute distress.  VITAL SIGNS: Her weight is 110, pressure 126/60, temperature 97.9, pulse of 99, oxygen saturation 100% on room air.  HEENT: Head is normocephalic, atraumatic. Pupils equal and reactive to light. Sclera anicteric. Conjunctivae are pale. EOMs are intact. Ears are clear with normal landmarks. Oropharynx has upper dentures with mild irritation along the lateral tongue. No posterior pharynx lesions noted.  NECK: Supple. No adenopathy. No thyromegaly.  HEART: Regular rate and rhythm in the 90s. No murmurs noted.  LUNGS: Clear to auscultation bilaterally.  ABDOMEN: Soft, nontender, normoactive bowel sounds.  RECTAL: No hemorrhoids. Hemoccult negative. Nontender.  SKIN: Warm and dry.  EXTREMITIES: No edema. 2+ symmetric pulses. NEURO: Nonfocal.   ASSESSMENT AND PLAN:  1.  Anemia, acute with an MCV of 60. Hemoccult negative. Hemoglobin 5.9. Admit for transfusion x 2 units. Consult GI for further evaluation. IV Protonix 40 mg q.12 hours. CBC and MET-C in the a.m. Clear liquid diet.  2.  Chronic obstructive pulmonary disease, stable. Consider inhalers if needed.  3.  Restless leg syndrome. Continue gabapentin and Topamax.   ____________________________ Marily Lente. Vidal Schwalbe, PA-C  rjt:aw D: 05/13/2013 13:22:53 ET T: 05/13/2013 13:34:53 ET JOB#: [id number]  cc: Marily Lente. Lolita Lenz, <Dictator> Leonard Downing PA ELECTRONICALLY SIGNED 05/30/2013 15:57

## 2014-05-02 NOTE — Consult Note (Signed)
Details:   - GI note:   I have seen and examined Ms Tanya Rhodes and agree with Kristine LineaKaryn Earles' a/p.   She has a severe microcytic anemia.  No overt bleeding.  Hemicolectomy 2 years ago for multiple TA.   Plan:  EGD and colonoscopy on Thursday.  Capsule in negative.  Clears tonight and tomorrow.  Miralax prep with 1/2 tomorrow evening and half early am on thursday.   Electronic Signatures: Dow Adolphein, Shantal Roan (MD)  (Signed [phone]-May-15 18:12)  Authored: Details   Last Updated: 05-May-15 18:12 by Dow Adolphein, Birt Reinoso (MD)

## 2014-05-03 NOTE — Op Note (Signed)
PATIENT NAME:  Tanya Rhodes, Tanya Rhodes MR#:  [id number] DATE OF BIRTH:  [birth year]  DATE OF PROCEDURE:  07/07/2011  PREOPERATIVE DIAGNOSIS: Multiple colonic polyps   POSTOPERATIVE DIAGNOSIS: Multiple colonic polyps.   PROCEDURE: Laparoscopic resection of right transverse and descending colon.   SURGEON: Renda Rolls, MD   ANESTHESIA: General.   INDICATIONS: This 74 year old female recently had a colonoscopy with findings of multiple tubular adenomas of the cecum, hepatic flexure and splenic flexure which could not be resected with the colonoscope, and excision of this segment of colon was recommended for definitive treatment and further evaluation.   DESCRIPTION OF PROCEDURE: The patient was placed on the operating table in the supine position under general endotracheal anesthesia. The abdomen was prepared with ChloraPrep and draped in a sterile manner.   A short incision was made below the umbilicus and dissected down through the deep fascia, which was grasped with a laryngeal hook and elevated. A Veress needle was inserted, aspirated, and irrigated with a saline solution. Next, the peritoneal cavity was inflated with carbon dioxide. The Veress needle was removed. The 10 mm cannula was inserted. The 10 mm, 0-degree laparoscope was inserted to view the peritoneal cavity. Another incision was made in the epigastrium just about 2 inches above the umbilicus to insert an 11 mm cannula. Another was inserted in the right mid abdomen and another in the left mid abdomen.   Initial inspection revealed the liver appeared normal. There were no significant adhesions found. Next, I noted an ink mark in the descending colon and also noted an ink mark in the right transverse colon. Next, the descending colon was mobilized with incision of the lateral peritoneal reflection using the Harmonic scalpel. This was carried down to the proximal aspect of the sigmoid colon just below the ink mark. Next, the omentum was  dissected away from the transverse colon. Next, the cecum and terminal ileum were mobilized with incision the of lateral peritoneal reflection;, also, the ascending colon and hepatic flexure were mobilized. Further dissection was carried out alternating between the right colon and transverse colon. The  duodenum was identified and the mesentery was separated from it. Next, further dissection was carried out to further mobilize the splenic flexure and descending colon.   After satisfactory mobilization of the bowel, the laparoscopic instruments were removed. An incision was made from the infraumbilical port site to the supraumbilical port site and dissected down through the midline fascia. Next, the right colon was brought out on the abdominal wall and inspected and then returned to the abdominal cavity. Next, the transverse colon was brought out, examined and returned, and the descending colon was brought out. It appeared that the bowel was satisfactorily mobilized. Next, a site was selected in the descending colon distal to the ink mark. A window was created in the mesentery, and the mesenteric dissection was begun with the Harmonic scalpel. After beginning this, next I returned to the cecum and identified the terminal ileum. A small opening was made in the mesentery of the terminal ileum some 3 inches proximal to the ileocecal valve, and I began this dissection with the Harmonic scalpel. Next, the ileocolic vessels were dissected. The ileocolic vein and artery were ligated with 0 chromic and also divided with the Harmonic scalpel. Also, the middle colic vessels were individually ligated with 0 chromic and then divided with the Harmonic scalpel. There were several other palpable arteries and identifiable veins which were ligated with 0 chromic and divided. The dissection proceeded from the  cecum around the transverse colon to include the descending colon, and subsequently the small bowel was brought adjacent to  the descending colon, held with Allis clamps, and I made an enterotomy and a colotomy; and the 75 mm GIA stapler was introduced along the antimesenteric border, engaged and activated to begin the anastomosis. Next, the anastomosis this portion was inspected. Hemostasis was intact, and the anastomosis was then completed with application of the TA-60 stapler which was placed transverse to the previous staple line, engaged and activated and the specimen resected. The staple line was inspected. Several small bleeding points were cauterized. The junction of the staple line was imbricated with a 5-0 Vicryl figure-of-eight suture. The anastomosis looked good. It was widely patent.   The specimen was examined on the side table; and I made an opening in the cecum, and in the transverse colon, and in the descending colon and identified polyps and submitted specimen in formalin for routine pathology.   Gloves were changed, pool suction was introduced in  multiple areas, and there was no significant blood found. The omentum was brought beneath the wound, and the midline fascia was closed with interrupted 0 Maxon figure-of-eight sutures. All skin incisions were closed with interrupted 4-0 nylon vertical mattress sutures, and dressings were applied with paper tape.   The procedure for this patient was significantly longer than a typical right colectomy as it did include the transverse and the descending colon. The patient entered the room at approximately 7:30 and completed the operation approximately 11:30.  The patient tolerated the procedure well and was moved to recovery.  ____________________________ Shela CommonsJ. Renda RollsWilton Janeece Blok, MD jws:cbb D: 07/07/2011 11:55:22 ET T: 07/07/2011 13:21:14 ET JOB#: [id number]  cc: Adella HareJ. Tanya Shannan Garfinkel, MD, <Dictator> Adella HareWILTON J Shalan Neault MD ELECTRONICALLY SIGNED 07/07/2011 19:01

## 2014-05-03 NOTE — Consult Note (Signed)
Full consult to follow. Had multiple large sessile polyps seen by Dr. Mechele CollinElliott. Had right colectomy extending to descending colon 1 wk ago. Developed acute diarrhea afterwards. Received 1 dose of prophylactic Abx prior to surgery. Started on flagyl for positive c.diff. Still with multiple loose BM's. No bleeding. No nausea now. With 2/3 of colon removed, less capacity to absorb H2O. If part of TI removed with surgery, may have bile salt induced diarrhea as well. Anyway, will switch to po vancomycin and see if diarrhea improves. May add questran later if no signif changes. Will let Dr. Mechele CollinElliott know of pt's condition. Thanks.  Electronic Signatures: Lutricia Feilh, Janaki Exley (MD)  (Signed on 07-Jul-13 11:29)  Authored  Last Updated: 07-Jul-13 11:29 by Lutricia Feilh, Kiyana Vazguez (MD)

## 2014-05-03 NOTE — Consult Note (Signed)
PATIENT NAME:  Tanya Rhodes, Nayara F MR#:  [id number] DATE OF BIRTH:  05/22/1940  DATE OF CONSULTATION:  07/15/2011  REFERRING PHYSICIAN:   CONSULTING PHYSICIAN:  Ezzard StandingPaul Y. Alexsus Papadopoulos, MD  REASON FOR REFERRAL: Positive C. difficile and persistent diarrhea.   HISTORY OF PRESENT ILLNESS: Patient is a 74 year old white female who initially underwent a repeat colonoscopy by Dr. Mechele CollinElliott on 05/15/2011 for history of polyps. Patient was found to have several large sessile polyps throughout the colon including cecum, hepatic flexure, and splenic flexure. They were not amenable to endoscopic removal. Therefore patient underwent right hemicolectomy that extended to the descending colon by Dr. Renda RollsWilton Smith on 06/28. Unfortunately, patient developed acute diarrhea afterwards. Patient received one dose of prophylactic antibiotics. Prior to the surgery patient did not have any problems with her bowel movements other than some intermittent constipation. Stool study showed that she had positive C. difficile infection. Patient was then started on Flagyl on 07/04. I was consulted today because of persistent diarrhea. According to the nurse patient was having multiple loose stools yesterday.   When I talked to the patient was in no acute distress. She could not remember how many bowel movements she had yesterday but every time she would eat she goes to the bathroom immediately. Patient complained of some lower abdominal cramping but no significant abdominal pain. There is no nausea at this time. There is no fevers or chills.   MEDICATIONS: Currently she is on medications including:  1. Neurontin. 2. Inhalers. 3. Zantac twice a day. 4. Aldactone daily. 5. Heparin injection. 6. Tylenol p.r.n.  7. Zofran p.r.n.   OTHER HISTORY:  1. Chronic obstructive pulmonary disease.  2. Restless leg syndrome. 3. Coronary artery disease. 4. Hypertension. 5. Hypercholesterolemia. 6. Emphysema.   PAST SURGICAL HISTORY:  1. Left carpal  tunnel surgery.  2. Appendectomy.  3. Hysterectomy. 4. Rotator cuff surgery. 5. Cardiac stent.   PHYSICAL EXAMINATION:  GENERAL: Patient is in really no distress right now.   VITAL SIGNS: She was afebrile, temperature 95, pulse 85, respirations 18, blood pressure 116/54, pulse oximetry 97% on room air.   HEENT: Normocephalic, atraumatic head. Pupils are equally reactive. Throat was clear.   NECK: Supple.   CARDIAC: Regular rhythm and rate without murmurs.   LUNGS: Clear bilaterally today.   ABDOMEN: Normoactive bowel sounds, soft. There is a big surgical scar in the midabdomen from the surgery. It was soft. There is some mild tenderness in lower abdomen. There is no rebound or guarding. There is no palpable masses. She had active bowel sounds.   EXTREMITIES: No clubbing, cyanosis, or edema.   LABORATORY, DIAGNOSTIC AND RADIOLOGICAL DATA: Most recent labs showed odium 134, potassium 3.0 today, chloride 98, CO2 31, BUN 14, creatinine 0.87, white count 7.1 today. Hemoglobin 11.9.   ASSESSMENT AND PLAN: This is a patient with positive C. difficile probably from the antibiotic use prior to the colonoscopy. So far she is not responding very well to Flagyl. With two-thirds of her colon removed, there is less capacity for the colon to absorb water which can certainly contribute to the current diarrhea. The terminal ileum was removed from the hemicolectomy, she could also have bile salt-induced diarrhea as well that can contribute to her diarrhea.   At this point will switch the Flagyl to vancomycin p.o. 250 mg every six hours and see whether that will help. If there is no significant improvement in the next day or two we can add Questran as well to absorb bile and  see whether that helps with the diarrhea. I will let Dr. Mechele Collin know of patient's condition in the hospital. Thank you for the referral.   ____________________________ Ezzard Standing. Bluford Kaufmann, MD pyo:cms D: 07/16/2011 17:30:00 ET T: 07/17/2011  10:02:46 ET JOB#: [id number]  cc: Ezzard Standing. Bluford Kaufmann, MD, <Dictator> Ezzard Standing Ociel Retherford MD ELECTRONICALLY SIGNED 07/17/2011 10:41

## 2014-05-03 NOTE — Consult Note (Signed)
Pt stools starting to form up.  Had no nocturnal bowel movements during the night last night or night before.  She will be tending to have loose stools due to signif colon resection for polyps.  May be able to go home tomorrow on oral vancomycin and follow up with us in a few days.  I spoke to son about using dilute bleach to wipe down surfaces in her house to help prevent relapse. Diet and iv plans discussed with Dr. Katrinka BlazingSmith.  Electronic Signatures: Scot JunElliott, Mulan Adan T (MD)  (Signed on 08-Jul-13 13:03)  Authored  Last Updated: 08-Jul-13 13:03 by Scot JunElliott, Golden Emile T (MD)

## 2014-07-15 ENCOUNTER — Emergency Department: Payer: PPO

## 2014-07-15 ENCOUNTER — Emergency Department
Admission: EM | Admit: 2014-07-15 | Discharge: 2014-07-15 | Disposition: A | Discharge status: Home / Self Care | Payer: PPO | Attending: Student | Admitting: Student

## 2014-07-15 ENCOUNTER — Encounter: Payer: Self-pay | Admitting: Emergency Medicine

## 2014-07-15 DIAGNOSIS — J441 Chronic obstructive pulmonary disease with (acute) exacerbation: Secondary | ICD-10-CM | POA: Diagnosis not present

## 2014-07-15 DIAGNOSIS — Z87891 Personal history of nicotine dependence: Secondary | ICD-10-CM | POA: Insufficient documentation

## 2014-07-15 DIAGNOSIS — R0602 Shortness of breath: Secondary | ICD-10-CM | POA: Diagnosis present

## 2014-07-15 DIAGNOSIS — I10 Essential (primary) hypertension: Secondary | ICD-10-CM | POA: Insufficient documentation

## 2014-07-15 HISTORY — DX: Essential (primary) hypertension: I10

## 2014-07-15 HISTORY — DX: Chronic obstructive pulmonary disease, unspecified: J44.9

## 2014-07-15 LAB — CBC WITH DIFFERENTIAL/PLATELET
BASOS PCT: 1 %
Basophils Absolute: 0.1 10*3/uL (ref 0–0.1)
EOS ABS: 0.3 10*3/uL (ref 0–0.7)
Eosinophils Relative: 4 %
HCT: 33.1 % — ABNORMAL LOW (ref 35.0–47.0)
Hemoglobin: 10.1 g/dL — ABNORMAL LOW (ref 12.0–16.0)
Lymphocytes Relative: 52 %
Lymphs Abs: 4.4 10*3/uL — ABNORMAL HIGH (ref 1.0–3.6)
MCH: 20.4 pg — ABNORMAL LOW (ref 26.0–34.0)
MCHC: 30.6 g/dL — AB (ref 32.0–36.0)
MCV: 66.8 fL — ABNORMAL LOW (ref 80.0–100.0)
MONO ABS: 0.7 10*3/uL (ref 0.2–0.9)
MONOS PCT: 8 %
Neutro Abs: 2.8 10*3/uL (ref 1.4–6.5)
Neutrophils Relative %: 35 %
PLATELETS: 276 10*3/uL (ref 150–440)
RBC: 4.95 MIL/uL (ref 3.80–5.20)
RDW: 18.4 % — AB (ref 11.5–14.5)
WBC: 8.2 10*3/uL (ref 3.6–11.0)

## 2014-07-15 LAB — TROPONIN I

## 2014-07-15 LAB — BASIC METABOLIC PANEL
ANION GAP: 10 (ref 5–15)
BUN: 16 mg/dL (ref 6–20)
CHLORIDE: 103 mmol/L (ref 101–111)
CO2: 24 mmol/L (ref 22–32)
Calcium: 8.9 mg/dL (ref 8.9–10.3)
Creatinine, Ser: 0.87 mg/dL (ref 0.44–1.00)
GFR calc Af Amer: >60 mL/min (ref >60)
GFR calc non Af Amer: >60 mL/min (ref >60)
GLUCOSE: 157 mg/dL — AB (ref 65–99)
Potassium: 3.5 mmol/L (ref 3.5–5.1)
SODIUM: 137 mmol/L (ref 135–145)

## 2014-07-15 LAB — FIBRIN DERIVATIVES D-DIMER (ARMC ONLY): FIBRIN DERIVATIVES D-DIMER (ARMC): 469 (ref 0–499)

## 2014-07-15 MED ORDER — PREDNISONE 20 MG PO TABS
ORAL_TABLET | ORAL | Status: AC
  Administered 2014-07-15: 60 mg via ORAL
  Filled 2014-07-15: qty 3

## 2014-07-15 MED ORDER — PREDNISONE 20 MG PO TABS
60.0000 mg | ORAL_TABLET | Freq: Once | ORAL | Status: AC
  Administered 2014-07-15: 60 mg via ORAL

## 2014-07-15 MED ORDER — METHYLPREDNISOLONE SODIUM SUCC 125 MG IJ SOLR
INTRAMUSCULAR | Status: AC
  Filled 2014-07-15: qty 2

## 2014-07-15 MED ORDER — SODIUM CHLORIDE 0.9 % IV BOLUS (SEPSIS)
1000.0000 mL | Freq: Once | INTRAVENOUS | Status: AC
  Administered 2014-07-15: 1000 mL via INTRAVENOUS

## 2014-07-15 MED ORDER — IPRATROPIUM-ALBUTEROL 0.5-2.5 (3) MG/3ML IN SOLN
3.0000 mL | RESPIRATORY_TRACT | Status: AC
  Administered 2014-07-15 (×3): 3 mL via RESPIRATORY_TRACT

## 2014-07-15 MED ORDER — AZITHROMYCIN 250 MG PO TABS: ORAL_TABLET | ORAL | Status: DC

## 2014-07-15 MED ORDER — PREDNISONE 20 MG PO TABS: 40.0000 mg | ORAL_TABLET | Freq: Every day | ORAL | Status: DC

## 2014-07-15 MED ORDER — METHYLPREDNISOLONE SODIUM SUCC 125 MG IJ SOLR: 125.0000 mg | Freq: Once | INTRAMUSCULAR | Status: DC

## 2014-07-15 NOTE — Discharge Instructions (Signed)

## 2014-07-15 NOTE — ED Provider Notes (Signed)
[facility] Emergency Department Provider Note  ____________________________________________  Time seen: 11:50 PM  I have reviewed the triage vital signs and the nursing notes.   HISTORY  Chief Complaint Shortness of Breath    HPI Tanya Rhodes is a 74 y.o. female who woke up from sleep around 11:00 this morning. She noted that she was short of breath at that time. She used both of her inhalers that she has her COPD which did not relieve her symptoms so she called EMS and came to the ED. Denies chest pain. Has some mild nonproductive cough. No fevers or chills, no recent illness. She quit smoking 20 years ago.     Past Medical History  Diagnosis Date  . COPD (chronic obstructive pulmonary disease)   . Hypertension     There are no active problems to display for this patient.   History reviewed. No pertinent past surgical history.  Current Outpatient Rx  Name  Route  Sig  Dispense  Refill  . azithromycin (ZITHROMAX Z-PAK) 250 MG tablet      Take 2 tablets (500 mg) on  Day 1,  followed by 1 tablet (250 mg) once daily on Days 2 through 5.   6 each   0   . predniSONE (DELTASONE) 20 MG tablet   Oral   Take 2 tablets (40 mg total) by mouth daily.   8 tablet   0     Allergies Review of patient's allergies indicates no known allergies.  History reviewed. No pertinent family history.  Social History History  Substance Use Topics  . Smoking status: Not on file  . Smokeless tobacco: Not on file  . Alcohol Use: Not on file    Review of Systems  Constitutional: No fever or chills. No weight changes Eyes:No blurry vision or double vision.  ENT: No sore throat. Cardiovascular: No chest pain. Respiratory: As above. Gastrointestinal: Negative for abdominal pain, vomiting and diarrhea.  No BRBPR or melena. Genitourinary: Negative for dysuria, urinary retention, bloody urine, or difficulty urinating. Musculoskeletal: Negative for back  pain. No joint swelling or pain. Skin: Negative for rash. Neurological: Negative for headaches, focal weakness or numbness. Psychiatric:No anxiety or depression.   Endocrine:No hot/cold intolerance, changes in energy, or sleep difficulty.  10-point ROS otherwise negative.  ____________________________________________   PHYSICAL EXAM:  VITAL SIGNS: ED Triage Vitals  Enc Vitals Group     BP 07/15/14 1154 128/68 mmHg     Pulse Rate 07/15/14 1154 120     Resp --      Temp 07/15/14 1154 97.7 F (36.5 C)     Temp Source 07/15/14 1154 Oral     SpO2 07/15/14 1154 100 %     Weight 07/15/14 1154 105 lb (47.628 kg)     Height 07/15/14 1154 5\' 1"  (1.549 m)     Head Cir --      Peak Flow --      Pain Score 07/15/14 1155 0     Pain Loc --      Pain Edu? --      Excl. in GC? --      Constitutional: Alert and oriented. Mild respiratory distress. Eyes: No scleral icterus. No conjunctival pallor. PERRL. EOMI ENT   Head: Normocephalic and atraumatic.   Nose: No congestion/rhinnorhea. No septal hematoma   Mouth/Throat: MMM, no pharyngeal erythema. No peritonsillar mass. No uvula shift.   Neck: No stridor. No SubQ emphysema. No meningismus. Hematological/Lymphatic/Immunilogical: No cervical lymphadenopathy. Cardiovascular: RRR.  Normal and symmetric distal pulses are present in all extremities. No murmurs, rubs, or gallops. Respiratory: Prolonged expirations with diffuse wheezing. No focal crackles. Good air entry throughout Gastrointestinal: Soft and nontender. No distention. There is no CVA tenderness.  No rebound, rigidity, or guarding. Genitourinary: deferred Musculoskeletal: Nontender with normal range of motion in all extremities. No joint effusions.  No lower extremity tenderness.  No edema. Neurologic:   Normal speech and language.  CN 2-10 normal. Motor grossly intact. No pronator drift.  Normal gait. No gross focal neurologic deficits are appreciated.  Skin:   Skin is warm, dry and intact. No rash noted.  No petechiae, purpura, or bullae. Psychiatric: Mood and affect are normal. Speech and behavior are normal. Patient exhibits appropriate insight and judgment.  ____________________________________________    LABS (pertinent positives/negatives) (all labs ordered are listed, but only abnormal results are displayed) Labs Reviewed  BASIC METABOLIC PANEL - Abnormal; Notable for the following:    Glucose, Bld 157 (*)    All other components within normal limits  CBC WITH DIFFERENTIAL/PLATELET - Abnormal; Notable for the following:    Hemoglobin 10.1 (*)    HCT 33.1 (*)    MCV 66.8 (*)    MCH 20.4 (*)    MCHC 30.6 (*)    RDW 18.4 (*)    Lymphs Abs 4.4 (*)    All other components within normal limits  TROPONIN I  FIBRIN DERIVATIVES D-DIMER ([facility] ONLY)   ____________________________________________   EKG  Interpreted by me Sinus tachycardia rate 126, normal axis and intervals, poor R-wave progression in anterior precordial leads. Normal ST segments and T waves.  ____________________________________________    RADIOLOGY  Chest x-ray unremarkable  ____________________________________________   PROCEDURES  ____________________________________________   INITIAL IMPRESSION / ASSESSMENT AND PLAN / ED COURSE  Pertinent labs & imaging results that were available during my care of the patient were reviewed by me and considered in my medical decision making (see chart for details).  Patient presents with what appears to be a COPD exacerbation. With her history suggestive of a sudden onset we'll send a d-dimer. I have low suspicion for PE pneumothorax TAD pneumonia ACS at this time. Give her DuoNeb's on top of the Solu-Medrol she was given by EMS.  ----------------------------------------- 1:40 PM on 07/15/2014 -----------------------------------------  Patient has improved with treatment in the ED. Heart rate is 100-106. Repeat lung  exam reveals good air entry throughout. Normalized expiratory phase. She does have some inducible wheeze with forced expiration, but overall her air movement is much improved. The patient feels much better. We'll plan to start her on azithromycin and keep her on a course of prednisone. She is suitable for outpatient therapy and can be discharged home to follow up with primary care.  ____________________________________________   FINAL CLINICAL IMPRESSION(S) / ED DIAGNOSES  Final diagnoses:  Acute exacerbation of chronic obstructive pulmonary disease (COPD)      Sharman Cheek, MD 07/15/14 1341

## 2014-08-12 LAB — CK TOTAL AND CKMB (NOT AT ARMC)
CK, TOTAL: 96 U/L
CK-MB: 2.4 ng/mL

## 2014-08-12 LAB — COMPREHENSIVE METABOLIC PANEL
ANION GAP: 12 (ref 7–16)
Albumin: 4.5 g/dL
Alkaline Phosphatase: 75 U/L
BUN: 14 mg/dL
Bilirubin,Total: 0.4 mg/dL
Calcium, Total: 9.4 mg/dL
Chloride: 102 mmol/L
Co2: 23 mmol/L
Creatinine: 0.94 mg/dL
EGFR (African American): >60
GLUCOSE: 129 mg/dL — AB
Potassium: 3.5 mmol/L
SGOT(AST): 24 U/L
SGPT (ALT): 18 U/L
Sodium: 137 mmol/L
TOTAL PROTEIN: 7.3 g/dL

## 2014-08-12 LAB — TSH: Thyroid Stimulating Horm: 0.969 u[IU]/mL

## 2014-08-12 LAB — TROPONIN I: Troponin-I: <0.03 ng/mL

## 2014-08-25 ENCOUNTER — Other Ambulatory Visit: Payer: Self-pay | Admitting: Oncology

## 2015-02-18 ENCOUNTER — Emergency Department: Payer: PPO

## 2015-02-18 ENCOUNTER — Inpatient Hospital Stay
Admission: EM | Admit: 2015-02-18 | Discharge: 2015-02-19 | DRG: 190 | Disposition: A | Discharge status: Home / Self Care | Payer: PPO | Attending: Internal Medicine | Admitting: Internal Medicine

## 2015-02-18 ENCOUNTER — Encounter: Payer: Self-pay | Admitting: Internal Medicine

## 2015-02-18 DIAGNOSIS — Z79899 Other long term (current) drug therapy: Secondary | ICD-10-CM | POA: Diagnosis not present

## 2015-02-18 DIAGNOSIS — J189 Pneumonia, unspecified organism: Secondary | ICD-10-CM | POA: Diagnosis not present

## 2015-02-18 DIAGNOSIS — J9600 Acute respiratory failure, unspecified whether with hypoxia or hypercapnia: Secondary | ICD-10-CM | POA: Diagnosis present

## 2015-02-18 DIAGNOSIS — R918 Other nonspecific abnormal finding of lung field: Secondary | ICD-10-CM | POA: Diagnosis not present

## 2015-02-18 DIAGNOSIS — I10 Essential (primary) hypertension: Secondary | ICD-10-CM | POA: Diagnosis present

## 2015-02-18 DIAGNOSIS — J9601 Acute respiratory failure with hypoxia: Secondary | ICD-10-CM | POA: Diagnosis not present

## 2015-02-18 DIAGNOSIS — J441 Chronic obstructive pulmonary disease with (acute) exacerbation: Secondary | ICD-10-CM | POA: Diagnosis not present

## 2015-02-18 DIAGNOSIS — J449 Chronic obstructive pulmonary disease, unspecified: Secondary | ICD-10-CM

## 2015-02-18 DIAGNOSIS — J80 Acute respiratory distress syndrome: Secondary | ICD-10-CM | POA: Diagnosis not present

## 2015-02-18 DIAGNOSIS — J440 Chronic obstructive pulmonary disease with acute lower respiratory infection: Secondary | ICD-10-CM | POA: Diagnosis not present

## 2015-02-18 DIAGNOSIS — Z87891 Personal history of nicotine dependence: Secondary | ICD-10-CM | POA: Diagnosis not present

## 2015-02-18 DIAGNOSIS — R0602 Shortness of breath: Secondary | ICD-10-CM | POA: Diagnosis not present

## 2015-02-18 LAB — CBC
HCT: 35.7 % (ref 35.0–47.0)
Hemoglobin: 10.6 g/dL — ABNORMAL LOW (ref 12.0–16.0)
MCH: 20.2 pg — ABNORMAL LOW (ref 26.0–34.0)
MCHC: 29.7 g/dL — AB (ref 32.0–36.0)
MCV: 68.1 fL — ABNORMAL LOW (ref 80.0–100.0)
Platelets: 370 10*3/uL (ref 150–440)
RBC: 5.25 MIL/uL — ABNORMAL HIGH (ref 3.80–5.20)
RDW: 19.9 % — AB (ref 11.5–14.5)
WBC: 11.5 10*3/uL — ABNORMAL HIGH (ref 3.6–11.0)

## 2015-02-18 LAB — BASIC METABOLIC PANEL
Anion gap: 16 — ABNORMAL HIGH (ref 5–15)
BUN: 14 mg/dL (ref 6–20)
CO2: 20 mmol/L — ABNORMAL LOW (ref 22–32)
CREATININE: 0.98 mg/dL (ref 0.44–1.00)
Calcium: 9.5 mg/dL (ref 8.9–10.3)
Chloride: 103 mmol/L (ref 101–111)
GFR calc Af Amer: >60 mL/min (ref >60)
GFR calc non Af Amer: 55 mL/min — ABNORMAL LOW (ref >60)
Glucose, Bld: 147 mg/dL — ABNORMAL HIGH (ref 65–99)
Potassium: 4.1 mmol/L (ref 3.5–5.1)
SODIUM: 139 mmol/L (ref 135–145)

## 2015-02-18 LAB — TROPONIN I

## 2015-02-18 MED ORDER — OXYCODONE HCL 5 MG PO TABS
5.0000 mg | ORAL_TABLET | ORAL | Status: DC | PRN
  Filled 2015-02-18: qty 1

## 2015-02-18 MED ORDER — CYANOCOBALAMIN 500 MCG PO TABS
1000.0000 ug | ORAL_TABLET | Freq: Every day | ORAL | Status: DC
Start: 2015-02-18 — End: 2015-02-19
  Administered 2015-02-19: 1000 ug via ORAL
  Filled 2015-02-18: qty 2

## 2015-02-18 MED ORDER — ONDANSETRON HCL 4 MG PO TABS: 4.0000 mg | ORAL_TABLET | Freq: Four times a day (QID) | ORAL | Status: DC | PRN

## 2015-02-18 MED ORDER — LEVOFLOXACIN IN D5W 250 MG/50ML IV SOLN
250.0000 mg | INTRAVENOUS | Status: DC
  Filled 2015-02-18: qty 50

## 2015-02-18 MED ORDER — ENOXAPARIN SODIUM 40 MG/0.4ML ~~LOC~~ SOLN
40.0000 mg | SUBCUTANEOUS | Status: DC
  Administered 2015-02-19: 40 mg via SUBCUTANEOUS
  Filled 2015-02-18: qty 0.4

## 2015-02-18 MED ORDER — IPRATROPIUM-ALBUTEROL 0.5-2.5 (3) MG/3ML IN SOLN
6.0000 mL | Freq: Once | RESPIRATORY_TRACT | Status: AC
  Administered 2015-02-18: 6 mL via RESPIRATORY_TRACT

## 2015-02-18 MED ORDER — ONDANSETRON HCL 4 MG/2ML IJ SOLN: 4.0000 mg | Freq: Four times a day (QID) | INTRAMUSCULAR | Status: DC | PRN

## 2015-02-18 MED ORDER — SODIUM CHLORIDE 0.9 % IV SOLN: 250.0000 mL | INTRAVENOUS | Status: DC | PRN

## 2015-02-18 MED ORDER — LEVOFLOXACIN IN D5W 500 MG/100ML IV SOLN
500.0000 mg | Freq: Every day | INTRAVENOUS | Status: DC
  Filled 2015-02-18: qty 100

## 2015-02-18 MED ORDER — GABAPENTIN 300 MG PO CAPS
600.0000 mg | ORAL_CAPSULE | Freq: Every day | ORAL | Status: DC
  Administered 2015-02-19: 600 mg via ORAL
  Filled 2015-02-18: qty 2

## 2015-02-18 MED ORDER — LEVOFLOXACIN IN D5W 750 MG/150ML IV SOLN
750.0000 mg | Freq: Once | INTRAVENOUS | Status: AC
  Administered 2015-02-18: 750 mg via INTRAVENOUS
  Filled 2015-02-18: qty 150

## 2015-02-18 MED ORDER — IPRATROPIUM-ALBUTEROL 0.5-2.5 (3) MG/3ML IN SOLN
3.0000 mL | Freq: Four times a day (QID) | RESPIRATORY_TRACT | Status: DC
  Administered 2015-02-19 (×2): 3 mL via RESPIRATORY_TRACT
  Filled 2015-02-18 (×2): qty 3

## 2015-02-18 MED ORDER — IPRATROPIUM-ALBUTEROL 0.5-2.5 (3) MG/3ML IN SOLN: 3.0000 mL | Freq: Once | RESPIRATORY_TRACT | Status: DC

## 2015-02-18 MED ORDER — BUDESONIDE-FORMOTEROL FUMARATE 160-4.5 MCG/ACT IN AERO
2.0000 | INHALATION_SPRAY | Freq: Two times a day (BID) | RESPIRATORY_TRACT | Status: DC
  Administered 2015-02-19 (×2): 2 via RESPIRATORY_TRACT
  Filled 2015-02-18: qty 6

## 2015-02-18 MED ORDER — ESCITALOPRAM OXALATE 10 MG PO TABS
5.0000 mg | ORAL_TABLET | Freq: Every day | ORAL | Status: DC
  Administered 2015-02-19: 5 mg via ORAL
  Filled 2015-02-18: qty 1

## 2015-02-18 MED ORDER — SODIUM CHLORIDE 0.9% FLUSH
3.0000 mL | Freq: Two times a day (BID) | INTRAVENOUS | Status: DC
  Administered 2015-02-18 – 2015-02-19 (×2): 3 mL via INTRAVENOUS

## 2015-02-18 MED ORDER — SODIUM CHLORIDE 0.9% FLUSH: 3.0000 mL | INTRAVENOUS | Status: DC | PRN

## 2015-02-18 MED ORDER — IPRATROPIUM-ALBUTEROL 0.5-2.5 (3) MG/3ML IN SOLN: 6.0000 mL | RESPIRATORY_TRACT | Status: DC | PRN

## 2015-02-18 MED ORDER — FAMOTIDINE 20 MG PO TABS
20.0000 mg | ORAL_TABLET | Freq: Two times a day (BID) | ORAL | Status: DC
  Administered 2015-02-19 (×2): 20 mg via ORAL
  Filled 2015-02-18 (×2): qty 1

## 2015-02-18 MED ORDER — TRAMADOL HCL 50 MG PO TABS
75.0000 mg | ORAL_TABLET | Freq: Two times a day (BID) | ORAL | Status: DC | PRN
  Administered 2015-02-19: 75 mg via ORAL
  Filled 2015-02-18: qty 2

## 2015-02-18 MED ORDER — POLYETHYLENE GLYCOL 3350 17 G PO PACK: 17.0000 g | PACK | Freq: Every day | ORAL | Status: DC | PRN

## 2015-02-18 MED ORDER — FLUTICASONE PROPIONATE 50 MCG/ACT NA SUSP
2.0000 | Freq: Every day | NASAL | Status: DC
  Administered 2015-02-19: 2 via NASAL
  Filled 2015-02-18: qty 16

## 2015-02-18 MED ORDER — METHYLPREDNISOLONE SODIUM SUCC 125 MG IJ SOLR
125.0000 mg | Freq: Once | INTRAMUSCULAR | Status: AC
  Administered 2015-02-18: 125 mg via INTRAVENOUS
  Filled 2015-02-18: qty 2

## 2015-02-18 MED ORDER — METHYLPREDNISOLONE SODIUM SUCC 125 MG IJ SOLR
60.0000 mg | Freq: Every day | INTRAMUSCULAR | Status: DC
  Administered 2015-02-19: 60 mg via INTRAVENOUS
  Filled 2015-02-18: qty 2

## 2015-02-18 MED ORDER — SIMVASTATIN 20 MG PO TABS
20.0000 mg | ORAL_TABLET | Freq: Every day | ORAL | Status: DC
  Administered 2015-02-19: 20 mg via ORAL
  Filled 2015-02-18: qty 1

## 2015-02-18 MED ORDER — ACETAMINOPHEN 325 MG PO TABS
650.0000 mg | ORAL_TABLET | Freq: Four times a day (QID) | ORAL | Status: DC | PRN
  Administered 2015-02-19: 650 mg via ORAL
  Filled 2015-02-18: qty 2

## 2015-02-18 MED ORDER — ALBUTEROL SULFATE (2.5 MG/3ML) 0.083% IN NEBU
5.0000 mg | INHALATION_SOLUTION | Freq: Once | RESPIRATORY_TRACT | Status: AC
  Administered 2015-02-18: 5 mg via RESPIRATORY_TRACT

## 2015-02-18 MED ORDER — ACETAMINOPHEN 650 MG RE SUPP: 650.0000 mg | Freq: Four times a day (QID) | RECTAL | Status: DC | PRN

## 2015-02-18 MED ORDER — BISACODYL 5 MG PO TBEC: 5.0000 mg | DELAYED_RELEASE_TABLET | Freq: Every day | ORAL | Status: DC | PRN

## 2015-02-18 NOTE — ED Notes (Signed)
Pt currently on bipap. Pt reports some improvement. Pt is sating 97 % on 50 %. RR 38 .

## 2015-02-18 NOTE — H&P (Signed)
[facility] - [place] at [facility]   PATIENT NAME: Tanya Rhodes    MR#:  [id number]  DATE OF BIRTH:  1940/07/30  DATE OF ADMISSION:  02/18/2015  PRIMARY CARE PHYSICIAN: Barbette Reichmann, MD   REQUESTING/REFERRING PHYSICIAN: Dr. Derrill Kay  CHIEF COMPLAINT:   Chief Complaint  Patient presents with  . Respiratory Distress    HISTORY OF PRESENT ILLNESS:  Tanya Rhodes  is a 75 y.o. female with a known history of COPD, hypertension presents to the emergency room complaining of acute onset of shortness of breath and wheezing. Patient has been feeling weak with worsening shortness of breath and cough for the last 2 days. Today she went to the mall and while coming back was out in the cold and felt extremely short of breath. She wanted to rush back home for her nebulizer therapy but due to worsening shortness of breath had to come to the emergency room. Here patient was placed on a BiPAP had multiple breathing treatments and steroid. With no significant improvement patient is being admitted to the hospitalist service. Chest x-ray showed right lower lobe pneumonia.  PAST MEDICAL HISTORY:   Past Medical History  Diagnosis Date  . COPD (chronic obstructive pulmonary disease) (HCC)   . Hypertension     PAST SURGICAL HISTORY:  No past surgical history on file.  SOCIAL HISTORY:   Social History  Substance Use Topics  . Smoking status: Former Games developer  . Smokeless tobacco: Not on file  . Alcohol Use: Not on file    FAMILY HISTORY:   Family History  Problem Relation Age of Onset  . Heart disease Other     DRUG ALLERGIES:  No Known Allergies  REVIEW OF SYSTEMS:   Review of Systems  Constitutional: Positive for malaise/fatigue. Negative for fever, chills and weight loss.  HENT: Negative for hearing loss and nosebleeds.   Eyes: Negative for blurred vision, double vision and pain.  Respiratory: Positive for cough, shortness of breath and wheezing.  Negative for hemoptysis and sputum production.   Cardiovascular: Negative for chest pain, palpitations, orthopnea and leg swelling.  Gastrointestinal: Negative for nausea, vomiting, abdominal pain, diarrhea and constipation.  Genitourinary: Negative for dysuria and hematuria.  Musculoskeletal: Positive for myalgias. Negative for back pain and falls.  Skin: Negative for rash.  Neurological: Positive for weakness. Negative for dizziness, tremors, sensory change, speech change, focal weakness, seizures and headaches.  Endo/Heme/Allergies: Does not bruise/bleed easily.  Psychiatric/Behavioral: Negative for depression and memory loss. The patient is not nervous/anxious.     MEDICATIONS AT HOME:   Prior to Admission medications   Medication Sig Start Date End Date Taking? Authorizing Provider  acetaminophen (TYLENOL) 500 MG tablet Take 500 mg by mouth every 6 (six) hours as needed for moderate pain.   Yes Historical Provider, MD  albuterol (PROVENTIL HFA;VENTOLIN HFA) 108 (90 Base) MCG/ACT inhaler Inhale 2 puffs into the lungs every 6 (six) hours as needed for wheezing.   Yes Historical Provider, MD  budesonide-formoterol (SYMBICORT) 160-4.5 MCG/ACT inhaler Inhale 2 puffs into the lungs 2 (two) times daily.   Yes Historical Provider, MD  escitalopram (LEXAPRO) 10 MG tablet Take 5 mg by mouth at bedtime.   Yes Historical Provider, MD  fluticasone (FLONASE) 50 MCG/ACT nasal spray Place 2 sprays into both nostrils daily.   Yes Historical Provider, MD  gabapentin (NEURONTIN) 300 MG capsule Take 600 mg by mouth at bedtime.   Yes Historical Provider, MD  ranitidine (ZANTAC) 150 MG tablet Take 150 mg  by mouth 2 (two) times daily.   Yes Historical Provider, MD  simvastatin (ZOCOR) 20 MG tablet Take 20 mg by mouth at bedtime.   Yes Historical Provider, MD  traMADol (ULTRAM) 50 MG tablet Take 75 mg by mouth 2 (two) times daily as needed for severe pain.   Yes Historical Provider, MD  vitamin B-12  (CYANOCOBALAMIN) 1000 MCG tablet Take 1,000 mcg by mouth at bedtime.   Yes Historical Provider, MD  Vitamin D, Ergocalciferol, (DRISDOL) 50000 units CAPS capsule Take 50,000 Units by mouth every 7 (seven) days.   Yes Historical Provider, MD  azithromycin (ZITHROMAX Z-PAK) 250 MG tablet Take 2 tablets (500 mg) on  Day 1,  followed by 1 tablet (250 mg) once daily on Days 2 through 5. Patient not taking: Reported on 02/18/2015 07/15/14   Sharman Cheek, MD  predniSONE (DELTASONE) 20 MG tablet Take 2 tablets (40 mg total) by mouth daily. Patient not taking: Reported on 02/18/2015 07/15/14   Sharman Cheek, MD      VITAL SIGNS:  Blood pressure 125/54, pulse 104, resp. rate 16, SpO2 93 %.  PHYSICAL EXAMINATION:  Physical Exam  GENERAL:  75 y.o.-year-old patient lying in the bed with respiratory distress. EYES: Pupils equal, round, reactive to light and accommodation. No scleral icterus. Extraocular muscles intact.  HEENT: Head atraumatic, normocephalic. Oropharynx and nasopharynx clear. No oropharyngeal erythema, moist oral mucosa  NECK:  Supple, no jugular venous distention. No thyroid enlargement, no tenderness.  LUNGS: Decreased breath sounds with expiratory wheezing. Increased work of breathing. CARDIOVASCULAR: S1, S2 normal. No murmurs, rubs, or gallops.  ABDOMEN: Soft, nontender, nondistended. Bowel sounds present. No organomegaly or mass.  EXTREMITIES: No pedal edema, cyanosis, or clubbing. + 2 pedal & radial pulses b/l.   NEUROLOGIC: Cranial nerves II through XII are intact. No focal Motor or sensory deficits appreciated b/l PSYCHIATRIC: The patient is alert and oriented x 3. Anxious SKIN: No obvious rash, lesion, or ulcer.   LABORATORY PANEL:   CBC  Recent Labs Lab 02/18/15 1823  WBC 11.5*  HGB 10.6*  HCT 35.7  PLT 370   ------------------------------------------------------------------------------------------------------------------  Chemistries   Recent Labs Lab  02/18/15 1823  NA 139  K 4.1  CL 103  CO2 20*  GLUCOSE 147*  BUN 14  CREATININE 0.98  CALCIUM 9.5   ------------------------------------------------------------------------------------------------------------------  Cardiac Enzymes  Recent Labs Lab 02/18/15 1823  TROPONINI <0.03   ------------------------------------------------------------------------------------------------------------------  RADIOLOGY:  Dg Chest Port 1 View  02/18/2015  CLINICAL DATA:  Respiratory distress. EXAM: PORTABLE CHEST 1 VIEW COMPARISON:  07/15/2014 FINDINGS: Cardiomediastinal silhouette is normal. Mediastinal contours appear intact. There is right lower lobe patchy airspace consolidation. No evidence of pneumothorax or pleural effusion. Osseous structures are without acute abnormality. Soft tissues are grossly normal. IMPRESSION: Patchy right lower lobe airspace consolidation, which in the appropriate clinical settings likely represents pneumonia. Follow-up to resolution may be considered after empiric treatment. Electronically Signed   By: Ted Mcalpine M.D.   On: 02/18/2015 18:47     IMPRESSION AND PLAN:   * Acute respiratory failure due to COPD exacerbation and right lower lobe pneumonia -IV steroids, Antibiotics - Scheduled Nebulizers - Inhalers -Wean off BiPAP as tolerated - Consult pulmonary if no improvement. Patient sees Dr. Meredeth Ide  * Hypertension Continue home medications  * DVT prophylaxis with Lovenox  All the records are reviewed and case discussed with ED provider. Management plans discussed with the patient, family and they are in agreement.  CODE STATUS: FULL  TOTAL  TIME TAKING CARE OF THIS PATIENT: 45 minutes.    Milagros Loll R M.D on 02/18/2015 at 10:40 PM  Between 7am to 6pm - Pager - [phone]  After 6pm go to [email or web address] - password [email or web address]  [facility]  Office  [phone]  CC: Primary care physician; Barbette Reichmann,  MD  Note: This dictation was prepared with Dragon dictation along with smaller phrase technology. Any transcriptional errors that result from this process are unintentional.

## 2015-02-18 NOTE — ED Provider Notes (Signed)
[facility] Emergency Department Provider Note    ____________________________________________  Time seen: ~1815  I have reviewed the triage vital signs and the nursing notes.   HISTORY  Chief Complaint Respiratory Distress   History limited by: Not Limited   HPI Tanya Rhodes is a 75 y.o. female who presented to the emergency department today because of respiratory distress. The patient had just walked out of a store when the son states she started having increasing respiratory distress. Patient denied any chest pain. Does have history of COPD. Attempted albuterol inhalers with improvement of the symptoms. No recent fevers.     Past Medical History  Diagnosis Date  . COPD (chronic obstructive pulmonary disease)   . Hypertension     There are no active problems to display for this patient.   No past surgical history on file.  Current Outpatient Rx  Name  Route  Sig  Dispense  Refill  . azithromycin (ZITHROMAX Z-PAK) 250 MG tablet      Take 2 tablets (500 mg) on  Day 1,  followed by 1 tablet (250 mg) once daily on Days 2 through 5.   6 each   0   . predniSONE (DELTASONE) 20 MG tablet   Oral   Take 2 tablets (40 mg total) by mouth daily.   8 tablet   0     Allergies Review of patient's allergies indicates no known allergies.  No family history on file.  Social History History of smoking. Not a current smoker.  Review of Systems  Constitutional: Negative for fever. Cardiovascular: Negative for chest pain. Respiratory: Positive for shortness of breath. Gastrointestinal: Negative for abdominal pain, vomiting and diarrhea. Neurological: Negative for headaches, focal weakness or numbness.   10-point ROS otherwise negative.  ____________________________________________   PHYSICAL EXAM:  VITAL SIGNS: ED Triage Vitals  Enc Vitals Group     BP 02/18/15 1818 154/109 mmHg     Pulse Rate 02/18/15 1818 144     Resp 02/18/15  1818 36     Temp --      Temp src --      SpO2 02/18/15 1818 99 %   Constitutional: Alert and oriented. Motor is very distressed Eyes: Conjunctivae are normal. PERRL. Normal extraocular movements. ENT   Head: Normocephalic and atraumatic.   Nose: No congestion/rhinnorhea.   Mouth/Throat: Mucous membranes are moist.   Neck: No stridor. Hematological/Lymphatic/Immunilogical: No cervical lymphadenopathy. Cardiovascular: Tachycardia, regular rhythm.  No murmurs, rubs, or gallops. Respiratory: Moderate respiratory distress with tachypnea. Diffuse wheezing Gastrointestinal: Soft and nontender. No distention.  Genitourinary: Deferred Musculoskeletal: Normal range of motion in all extremities. No joint effusions.  No lower extremity tenderness nor edema. Neurologic:  Normal speech and language. No gross focal neurologic deficits are appreciated.  Skin:  Skin is warm, dry and intact. No rash noted. Psychiatric: Mood and affect are normal. Speech and behavior are normal. Patient exhibits appropriate insight and judgment.  ____________________________________________    LABS (pertinent positives/negatives)  Labs Reviewed  CBC - Abnormal; Notable for the following:    WBC 11.5 (*)    RBC 5.25 (*)    Hemoglobin 10.6 (*)    MCV 68.1 (*)    MCH 20.2 (*)    MCHC 29.7 (*)    RDW 19.9 (*)    All other components within normal limits  BASIC METABOLIC PANEL - Abnormal; Notable for the following:    CO2 20 (*)    Glucose, Bld 147 (*)  GFR calc non Af Amer 55 (*)    Anion gap 16 (*)    All other components within normal limits  CULTURE, BLOOD (ROUTINE X 2)  CULTURE, BLOOD (ROUTINE X 2)  TROPONIN I     ____________________________________________   EKG   I, Phineas Semen, attending physician, personally viewed and interpreted this EKG  EKG Time: 1819 Rate: 143 Rhythm: sinus tachycardia Axis: normal Intervals: qtc 506 QRS: LVH, intraventricular conduction  delay ST changes: st elevation V2, V3, likely repole Impression: abnormal ekg  I, Phineas Semen, attending physician, personally viewed and interpreted this EKG  EKG Time: 2027 Rate: 105 Rhythm: sinus tachycardia Axis: normal Intervals: qtc 501 QRS: intraventricular conduction delay ST changes: st elevation V3 Impression: abnormal ekg   ____________________________________________    RADIOLOGY  CXR IMPRESSION: Patchy right lower lobe airspace consolidation, which in the appropriate clinical settings likely represents pneumonia. Follow-up to resolution may be considered after empiric treatment.   ____________________________________________   PROCEDURES  Procedure(s) performed: None  Critical Care performed: Yes, see critical care note(s)  CRITICAL CARE Performed by: Phineas Semen   Total critical care time: 40 minutes  Critical care time was exclusive of separately billable procedures and treating other patients.  Critical care was necessary to treat or prevent imminent or life-threatening deterioration.  Critical care was time spent personally by me on the following activities: development of treatment plan with patient and/or surrogate as well as nursing, discussions with consultants, evaluation of patient's response to treatment, examination of patient, obtaining history from patient or surrogate, ordering and performing treatments and interventions, ordering and review of laboratory studies, ordering and review of radiographic studies, pulse oximetry and re-evaluation of patient's condition.  ____________________________________________   INITIAL IMPRESSION / ASSESSMENT AND PLAN / ED COURSE  Pertinent labs & imaging results that were available during my care of the patient were reviewed by me and considered in my medical decision making (see chart for details).  Patient presented to the emergency department today because of concerns for shortness  breath. Upon initial exam patient was moderate respiratory distress. The patient was placed on BiPAP given the level of respiratory distress. Discussed a was concerning for pneumonia and patient did have a mild leukocytosis. Because of this patient was given IV antibiotics. Patient was able to be taken off of the BiPAP after some time in treatments. She will be admitted to the hospital service.  ____________________________________________   FINAL CLINICAL IMPRESSION(S) / ED DIAGNOSES  Final diagnoses:  Community acquired pneumonia  Chronic obstructive pulmonary disease, unspecified COPD type (HCC)     Phineas Semen, MD 02/18/15 2134

## 2015-02-18 NOTE — ED Notes (Signed)
Pt to ed with respiratory distress. Pt taken straight back to er room 13. edp at bedside.

## 2015-02-19 DIAGNOSIS — J441 Chronic obstructive pulmonary disease with (acute) exacerbation: Secondary | ICD-10-CM | POA: Diagnosis not present

## 2015-02-19 DIAGNOSIS — J9601 Acute respiratory failure with hypoxia: Secondary | ICD-10-CM | POA: Diagnosis not present

## 2015-02-19 DIAGNOSIS — I10 Essential (primary) hypertension: Secondary | ICD-10-CM | POA: Diagnosis not present

## 2015-02-19 LAB — CBC
HEMATOCRIT: 27 % — AB (ref 35.0–47.0)
HEMOGLOBIN: 8.4 g/dL — AB (ref 12.0–16.0)
MCH: 19.9 pg — AB (ref 26.0–34.0)
MCHC: 31.1 g/dL — AB (ref 32.0–36.0)
MCV: 64.2 fL — AB (ref 80.0–100.0)
Platelets: 280 10*3/uL (ref 150–440)
RBC: 4.21 MIL/uL (ref 3.80–5.20)
RDW: 19.6 % — AB (ref 11.5–14.5)
WBC: 4.7 10*3/uL (ref 3.6–11.0)

## 2015-02-19 MED ORDER — LEVOFLOXACIN 500 MG PO TABS: 500.0000 mg | ORAL_TABLET | Freq: Every day | ORAL | Status: DC

## 2015-02-19 MED ORDER — IPRATROPIUM-ALBUTEROL 0.5-2.5 (3) MG/3ML IN SOLN: 3.0000 mL | Freq: Two times a day (BID) | RESPIRATORY_TRACT | Status: DC

## 2015-02-19 MED ORDER — PREDNISONE 10 MG PO TABS: 60.0000 mg | ORAL_TABLET | Freq: Every day | ORAL | Status: DC

## 2015-02-19 NOTE — Discharge Instructions (Signed)
Chronic Obstructive Pulmonary Disease °Chronic obstructive pulmonary disease (COPD) is a common lung problem. In COPD, the flow of air from the lungs is limited. The way your lungs work will probably never return to normal, but there are things you can do to improve your lungs and make yourself feel better. Your doctor may treat your condition with: °· Medicines. °· Oxygen. °· Lung surgery. °· Changes to your diet. °· Rehabilitation. This may involve a team of specialists. °HOME CARE °· Take all medicines as told by your doctor. °· Avoid medicines or cough syrups that dry up your airway (such as antihistamines) and do not allow you to get rid of thick spit. You do not need to avoid them if told differently by your doctor. °· If you smoke, stop. Smoking makes the problem worse. °· Avoid being around things that make your breathing worse (like smoke, chemicals, and fumes). °· Use oxygen therapy and therapy to help improve your lungs (pulmonary rehabilitation) if told by your doctor. If you need home oxygen therapy, ask your doctor if you should buy a tool to measure your oxygen level (oximeter). °· Avoid people who have a sickness you can catch (contagious). °· Avoid going outside when it is very hot, cold, or humid. °· Eat healthy foods. Eat smaller meals more often. Rest before meals. °· Stay active, but remember to also rest. °· Make sure to get all the shots (vaccines) your doctor recommends. Ask your doctor if you need a pneumonia shot. °· Learn and use tips on how to relax. °· Learn and use tips on how to control your breathing as told by your doctor. Try: °¨ Breathing in (inhaling) through your nose for 1 second. Then, pucker your lips and breath out (exhale) through your lips for 2 seconds. °¨ Putting one hand on your belly (abdomen). Breathe in slowly through your nose for 1 second. Your hand on your belly should move out. Pucker your lips and breathe out slowly through your lips. Your hand on your belly  should move in as you breathe out. °· Learn and use controlled coughing to clear thick spit from your lungs. The steps are: °1. Lean your head a little forward. °2. Breathe in deeply. °3. Try to hold your breath for 3 seconds. °4. Keep your mouth slightly open while coughing 2 times. °5. Spit any thick spit out into a tissue. °6. Rest and do the steps again 1 or 2 times as needed. °GET HELP IF: °· You cough up more thick spit than usual. °· There is a change in the color or thickness of the spit. °· It is harder to breathe than usual. °· Your breathing is faster than usual. °GET HELP RIGHT AWAY IF: °· You have shortness of breath while resting. °· You have shortness of breath that stops you from: °¨ Being able to talk. °¨ Doing normal activities. °· You chest hurts for longer than 5 minutes. °· Your skin color is more blue than usual. °· Your pulse oximeter shows that you have low oxygen for longer than 5 minutes. °MAKE SURE YOU: °· Understand these instructions. °· Will watch your condition. °· Will get help right away if you are not doing well or get worse. °  °This information is not intended to replace advice given to you by your health care provider. Make sure you discuss any questions you have with your health care provider. °  °Document Released: 06/14/2007 Document Revised: 01/16/2014 Document Reviewed: 08/22/2012 °Elsevier Interactive Patient   Education 2016 [employer].  Be sure to take the full regimen of antibiotics.  Do not stop the Prednisone abruptly, taper as ordered.

## 2015-02-19 NOTE — Progress Notes (Signed)
MD ordered patient to be discharged home.  Discharge instructions were reviewed with the patient and she voiced understanding.  Patient stated she could not take the Prednisone.  Dr. Sherryll Burger was notified and he was going to just discontinue to order and said to shred the prescription.  Follow-up appointments were made.  IV was removed with catheter intact.  All patient's and son's questions were answered.  Patient left via wheelchair escorted by auxillary and son.

## 2015-02-22 NOTE — Discharge Summary (Signed)
[facility] [place] [facility]   PATIENT NAME: Tanya Rhodes    MR#:  [id number]  DATE OF BIRTH:  December 14, 1940  DATE OF ADMISSION:  02/18/2015 ADMITTING PHYSICIAN: Milagros Loll, MD  DATE OF DISCHARGE: 02/19/2015  3:20 PM  PRIMARY CARE PHYSICIAN: Barbette Reichmann, MD    ADMISSION DIAGNOSIS:  Community acquired pneumonia [J18.9] Chronic obstructive pulmonary disease, unspecified COPD type (HCC) [J44.9]  DISCHARGE DIAGNOSIS:  Active Problems:   COPD exacerbation (HCC)   SECONDARY DIAGNOSIS:   Past Medical History  Diagnosis Date  . COPD (chronic obstructive pulmonary disease) (HCC)   . Hypertension     HOSPITAL COURSE:  75 y.o. female with a known history of COPD, hypertension admitted to the Hospital for acute onset of shortness of breath and wheezing. She was found to have Acute respiratory failure due to COPD exacerbation and right lower lobe pneumonia. In the emergency room she was placed on a BiPAP had multiple breathing treatments and steroid. With no significant improvement patient was being admitted to the hospitalist service. Please see Dr Eddie North dictated H & P for further details.  She had significant improvement within 24 hrs and was D/C home in stable condition. She was initially prescribed prednisone on D/C but she mentioned that she can't tolerate that and will not take it so was discontinued. She was agreeable with antibiotics and inhalers.  She was feeling close to her baseline breathing. She was D/C home in stable condition. She was agreeable with the D/C plans.  DISCHARGE CONDITIONS:   stable  CONSULTS OBTAINED:     DRUG ALLERGIES:  No Known Allergies  DISCHARGE MEDICATIONS:   Discharge Medication List as of 02/19/2015  1:59 PM    START taking these medications   Details  levofloxacin (LEVAQUIN) 500 MG tablet Take 1 tablet (500 mg total) by mouth daily., Starting 02/19/2015, Until Discontinued, Normal      CONTINUE  these medications which have CHANGED   Details  predniSONE (DELTASONE) 10 MG tablet Take 6 tablets (60 mg total) by mouth daily. Start 60 mg po daily, taper 10 mg daily till finish, Starting 02/19/2015, Until Discontinued, Print      CONTINUE these medications which have NOT CHANGED   Details  acetaminophen (TYLENOL) 500 MG tablet Take 500 mg by mouth every 6 (six) hours as needed for moderate pain., Until Discontinued, Historical Med    albuterol (PROVENTIL HFA;VENTOLIN HFA) 108 (90 Base) MCG/ACT inhaler Inhale 2 puffs into the lungs every 6 (six) hours as needed for wheezing., Until Discontinued, Historical Med    budesonide-formoterol (SYMBICORT) 160-4.5 MCG/ACT inhaler Inhale 2 puffs into the lungs 2 (two) times daily., Until Discontinued, Historical Med    escitalopram (LEXAPRO) 10 MG tablet Take 5 mg by mouth at bedtime., Until Discontinued, Historical Med    fluticasone (FLONASE) 50 MCG/ACT nasal spray Place 2 sprays into both nostrils daily., Until Discontinued, Historical Med    gabapentin (NEURONTIN) 300 MG capsule Take 600 mg by mouth at bedtime., Until Discontinued, Historical Med    ranitidine (ZANTAC) 150 MG tablet Take 150 mg by mouth 2 (two) times daily., Until Discontinued, Historical Med    simvastatin (ZOCOR) 20 MG tablet Take 20 mg by mouth at bedtime., Until Discontinued, Historical Med    traMADol (ULTRAM) 50 MG tablet Take 75 mg by mouth 2 (two) times daily as needed for severe pain., Until Discontinued, Historical Med    vitamin B-12 (CYANOCOBALAMIN) 1000 MCG tablet Take 1,000 mcg by mouth at bedtime.,  Until Discontinued, Historical Med    Vitamin D, Ergocalciferol, (DRISDOL) 50000 units CAPS capsule Take 50,000 Units by mouth every 7 (seven) days., Until Discontinued, Historical Med      STOP taking these medications     azithromycin (ZITHROMAX Z-PAK) 250 MG tablet          DISCHARGE INSTRUCTIONS:    DIET:  Regular diet  DISCHARGE CONDITION:   Good  ACTIVITY:  Activity as tolerated  OXYGEN:  Home Oxygen: No.   Oxygen Delivery: room air  DISCHARGE LOCATION:  home   If you experience worsening of your admission symptoms, develop shortness of breath, life threatening emergency, suicidal or homicidal thoughts you must seek medical attention immediately by calling 911 or calling your MD immediately  if symptoms less severe.  You Must read complete instructions/literature along with all the possible adverse reactions/side effects for all the Medicines you take and that have been prescribed to you. Take any new Medicines after you have completely understood and accpet all the possible adverse reactions/side effects.   Please note  You were cared for by a hospitalist during your hospital stay. If you have any questions about your discharge medications or the care you received while you were in the hospital after you are discharged, you can call the unit and asked to speak with the hospitalist on call if the hospitalist that took care of you is not available. Once you are discharged, your primary care physician will handle any further medical issues. Please note that NO REFILLS for any discharge medications will be authorized once you are discharged, as it is imperative that you return to your primary care physician (or establish a relationship with a primary care physician if you do not have one) for your aftercare needs so that they can reassess your need for medications and monitor your lab values.    On the day of Discharge:  VITAL SIGNS:  Blood pressure 128/55, pulse 116, temperature 97.8 F (36.6 C), temperature source Oral, resp. rate 20, height 5\' 1"  (1.549 m), weight 48.535 kg (107 lb), SpO2 98 %.  PHYSICAL EXAMINATION:  GENERAL:  75 y.o.-year-old patient lying in the bed with no acute distress.  EYES: Pupils equal, round, reactive to light and accommodation. No scleral icterus. Extraocular muscles intact.  HEENT: Head  atraumatic, normocephalic. Oropharynx and nasopharynx clear.  NECK:  Supple, no jugular venous distention. No thyroid enlargement, no tenderness.  LUNGS: Normal breath sounds bilaterally, no wheezing, rales,rhonchi or crepitation. No use of accessory muscles of respiration.  CARDIOVASCULAR: S1, S2 normal. No murmurs, rubs, or gallops.  ABDOMEN: Soft, non-tender, non-distended. Bowel sounds present. No organomegaly or mass.  EXTREMITIES: No pedal edema, cyanosis, or clubbing.  NEUROLOGIC: Cranial nerves II through XII are intact. Muscle strength 5/5 in all extremities. Sensation intact. Gait not checked.  PSYCHIATRIC: The patient is alert and oriented x 3.  SKIN: No obvious rash, lesion, or ulcer.  DATA REVIEW:   CBC  Recent Labs Lab 02/19/15 0457  WBC 4.7  HGB 8.4*  HCT 27.0*  PLT 280    Chemistries   Recent Labs Lab 02/18/15 1823  NA 139  K 4.1  CL 103  CO2 20*  GLUCOSE 147*  BUN 14  CREATININE 0.98  CALCIUM 9.5    Cardiac Enzymes  Recent Labs Lab 02/18/15 1823  TROPONINI <0.03    Microbiology Results  Results for orders placed or performed during the hospital encounter of 02/18/15  Blood culture (routine x 2)  Status: None (Preliminary result)   Collection Time: 02/18/15  7:50 PM  Result Value Ref Range Status   Specimen Description BLOOD BLOOD LEFT FOREARM  Final   Special Requests BOTTLES DRAWN AEROBIC AND ANAEROBIC  Final   Culture NO GROWTH 4 DAYS  Final   Report Status PENDING  Incomplete  Blood culture (routine x 2)     Status: None (Preliminary result)   Collection Time: 02/18/15  7:50 PM  Result Value Ref Range Status   Specimen Description BLOOD LEFT ANTECUBITAL  Final   Special Requests BOTTLES DRAWN AEROBIC AND ANAEROBIC  Final   Culture NO GROWTH 4 DAYS  Final   Report Status PENDING  Incomplete    RADIOLOGY:  No results found.   Management plans discussed with the patient, family and they are in agreement.  CODE STATUS:   Code Status History    Date Active Date Inactive Code Status Order ID Comments User Context   02/18/2015 10:08 PM 02/19/2015  6:32 PM Full Code [id number]  Milagros Loll, MD ED      TOTAL TIME TAKING CARE OF THIS PATIENT: 40 minutes.    [facility], Ivanka Kirshner M.D on 02/22/2015 at 9:52 AM  Between 7am to 6pm - Pager - [phone]  After 6pm go to [email or web address] - password [email or web address]  [facility]  Office  [phone]  CC: Primary care physician; Barbette Reichmann, MD   Note: This dictation was prepared with Dragon dictation along with smaller phrase technology. Any transcriptional errors that result from this process are unintentional.

## 2015-02-23 LAB — CULTURE, BLOOD (ROUTINE X 2)
Culture: NO GROWTH
Culture: NO GROWTH

## 2015-02-26 ENCOUNTER — Emergency Department: Payer: PPO

## 2015-02-26 ENCOUNTER — Encounter: Payer: Self-pay | Admitting: Emergency Medicine

## 2015-02-26 ENCOUNTER — Emergency Department
Admission: EM | Admit: 2015-02-26 | Discharge: 2015-02-26 | Disposition: A | Discharge status: Home / Self Care | Payer: PPO | Attending: Emergency Medicine | Admitting: Emergency Medicine

## 2015-02-26 DIAGNOSIS — J441 Chronic obstructive pulmonary disease with (acute) exacerbation: Secondary | ICD-10-CM | POA: Insufficient documentation

## 2015-02-26 DIAGNOSIS — Z79899 Other long term (current) drug therapy: Secondary | ICD-10-CM | POA: Diagnosis not present

## 2015-02-26 DIAGNOSIS — Z87891 Personal history of nicotine dependence: Secondary | ICD-10-CM | POA: Insufficient documentation

## 2015-02-26 DIAGNOSIS — I10 Essential (primary) hypertension: Secondary | ICD-10-CM | POA: Insufficient documentation

## 2015-02-26 DIAGNOSIS — Z7951 Long term (current) use of inhaled steroids: Secondary | ICD-10-CM | POA: Diagnosis not present

## 2015-02-26 DIAGNOSIS — R0602 Shortness of breath: Secondary | ICD-10-CM | POA: Diagnosis not present

## 2015-02-26 DIAGNOSIS — R000 Tachycardia, unspecified: Secondary | ICD-10-CM | POA: Diagnosis not present

## 2015-02-26 DIAGNOSIS — R05 Cough: Secondary | ICD-10-CM | POA: Diagnosis not present

## 2015-02-26 LAB — CBC
HEMATOCRIT: 27.9 % — AB (ref 35.0–47.0)
HEMOGLOBIN: 8.5 g/dL — AB (ref 12.0–16.0)
MCH: 19.6 pg — ABNORMAL LOW (ref 26.0–34.0)
MCHC: 30.5 g/dL — AB (ref 32.0–36.0)
MCV: 64.4 fL — ABNORMAL LOW (ref 80.0–100.0)
Platelets: 205 10*3/uL (ref 150–440)
RBC: 4.33 MIL/uL (ref 3.80–5.20)
RDW: 19.9 % — ABNORMAL HIGH (ref 11.5–14.5)
WBC: 8.2 10*3/uL (ref 3.6–11.0)

## 2015-02-26 LAB — TROPONIN I

## 2015-02-26 LAB — BASIC METABOLIC PANEL
ANION GAP: 6 (ref 5–15)
BUN: 13 mg/dL (ref 6–20)
CALCIUM: 8.9 mg/dL (ref 8.9–10.3)
CHLORIDE: 104 mmol/L (ref 101–111)
CO2: 28 mmol/L (ref 22–32)
CREATININE: 0.86 mg/dL (ref 0.44–1.00)
Glucose, Bld: 135 mg/dL — ABNORMAL HIGH (ref 65–99)
POTASSIUM: 3.6 mmol/L (ref 3.5–5.1)
SODIUM: 138 mmol/L (ref 135–145)

## 2015-02-26 MED ORDER — IPRATROPIUM-ALBUTEROL 0.5-2.5 (3) MG/3ML IN SOLN
RESPIRATORY_TRACT | Status: AC
  Administered 2015-02-26: 6 mL
  Filled 2015-02-26: qty 6

## 2015-02-26 MED ORDER — DEXAMETHASONE 4 MG PO TABS
10.0000 mg | ORAL_TABLET | Freq: Once | ORAL | Status: AC
  Administered 2015-02-26: 10 mg via ORAL
  Filled 2015-02-26: qty 2.5

## 2015-02-26 NOTE — ED Provider Notes (Signed)
[facility] Emergency Department Provider Note   ____________________________________________  Time seen: Upon EMS arrival I have reviewed the triage vital signs and the triage nursing note.  HISTORY  Chief Complaint Shortness of Breath   Historian Patient and son  HPI Tanya Rhodes is a 75 y.o. female who has a history of COPD, who became acutely short of breath and wheezy when she was shopping at the [employer]. EMS was called and gave her breathing treatment on the way over. Patient was refusing Solu-Medrol due to prednisone making her "feel crazy." She had been recently admitted overnight for COPD exacerbation and cardiac rule out. She is denying any chest pain. No fever. No coughing. She has been under a bit of stress, with the pharmacy not filling her prescriptions, and having to hostile with them. Symptoms are moderate.    Past Medical History  Diagnosis Date  . COPD (chronic obstructive pulmonary disease) (HCC)   . Hypertension     Patient Active Problem List   Diagnosis Date Noted  . COPD exacerbation (HCC) 02/18/2015    History reviewed. No pertinent past surgical history.  Current Outpatient Rx  Name  Route  Sig  Dispense  Refill  . acetaminophen (TYLENOL) 500 MG tablet   Oral   Take 500 mg by mouth every 6 (six) hours as needed for mild pain, moderate pain, fever or headache.          . albuterol (PROVENTIL HFA;VENTOLIN HFA) 108 (90 Base) MCG/ACT inhaler   Inhalation   Inhale 2 puffs into the lungs every 6 (six) hours as needed for wheezing or shortness of breath.          Marland Kitchen albuterol (PROVENTIL) (2.5 MG/3ML) 0.083% nebulizer solution   Nebulization   Take 2.5 mg by nebulization every 6 (six) hours as needed for wheezing or shortness of breath.         . ALPRAZolam (XANAX) 0.25 MG tablet   Oral   Take 0.25 mg by mouth at bedtime as needed for anxiety or sleep.         . budesonide-formoterol (SYMBICORT) 160-4.5  MCG/ACT inhaler   Inhalation   Inhale 2 puffs into the lungs 2 (two) times daily.         Marland Kitchen escitalopram (LEXAPRO) 5 MG tablet   Oral   Take 5 mg by mouth at bedtime.         . fluticasone (FLONASE) 50 MCG/ACT nasal spray   Each Nare   Place 2 sprays into both nostrils daily.         Marland Kitchen gabapentin (NEURONTIN) 300 MG capsule   Oral   Take 600 mg by mouth at bedtime.         . ranitidine (ZANTAC) 150 MG tablet   Oral   Take 150 mg by mouth 2 (two) times daily.         . simvastatin (ZOCOR) 20 MG tablet   Oral   Take 20 mg by mouth at bedtime.         . traMADol (ULTRAM) 50 MG tablet   Oral   Take 75 mg by mouth 2 (two) times daily as needed for moderate pain.          . vitamin B-12 (CYANOCOBALAMIN) 1000 MCG tablet   Oral   Take 1,000 mcg by mouth at bedtime.         . Vitamin D, Ergocalciferol, (DRISDOL) 50000 units CAPS capsule   Oral  Take 50,000 Units by mouth every 7 (seven) days. Pt takes on Saturday.         . levofloxacin (LEVAQUIN) 500 MG tablet   Oral   Take 1 tablet (500 mg total) by mouth daily. Patient not taking: Reported on 02/26/2015   5 tablet   0     Allergies Ropinirole; Oxycodone; and Prednisone  Family History  Problem Relation Age of Onset  . Heart disease Other     Social History Social History  Substance Use Topics  . Smoking status: Former Games developer  . Smokeless tobacco: None  . Alcohol Use: None    Review of Systems  Constitutional: Negative for fever. Eyes: Negative for visual changes. ENT: Negative for sore throat. Cardiovascular: Negative for chest pain. Respiratory: Positive for shortness of breath. Gastrointestinal: Negative for abdominal pain, vomiting and diarrhea. Genitourinary: Negative for dysuria. Musculoskeletal: Negative for back pain. Skin: Negative for rash. Neurological: Negative for headache. 10 point Review of Systems otherwise  negative ____________________________________________   PHYSICAL EXAM:  VITAL SIGNS: ED Triage Vitals  Enc Vitals Group     BP 02/26/15 1835 141/60 mmHg     Pulse Rate 02/26/15 1835 115     Resp 02/26/15 1835 24     Temp 02/26/15 1835 98.1 F (36.7 C)     Temp Source 02/26/15 1835 Oral     SpO2 02/26/15 1835 100 %     Weight 02/26/15 1835 117 lb (53.071 kg)     Height 02/26/15 1835 5\' 1"  (1.549 m)     Head Cir --      Peak Flow --      Pain Score --      Pain Loc --      Pain Edu? --      Excl. in GC? --      Constitutional: Alert and oriented. Moderate respiratory distress. HEENT   Head: Normocephalic and atraumatic.      Eyes: Conjunctivae are normal. PERRL. Normal extraocular movements.      Ears:         Nose: No congestion/rhinnorhea.   Mouth/Throat: Mucous membranes are moist.   Neck: No stridor. Cardiovascular/Chest: Tachycardic and regular.  No murmurs, rubs, or gallops. Respiratory: Normal respiratory rate but retractions. Limited/decreased air movement in all fields. Mild wheezing. Gastrointestinal: Soft. No distention, no guarding, no rebound. Nontender.    Genitourinary/rectal:Deferred Musculoskeletal: Nontender with normal range of motion in all extremities. No joint effusions.  No lower extremity tenderness.  No edema. Neurologic:  Normal speech and language. No gross or focal neurologic deficits are appreciated. Skin:  Skin is warm, dry and intact. No rash noted. Psychiatric: Mood and affect are normal. Speech and behavior are normal. Patient exhibits appropriate insight and judgment.  ____________________________________________   EKG I, Governor Rooks, MD, the attending physician have personally viewed and interpreted all ECGs.  115 bpm. Sinus tachycardia. LVH with repolarization abnormalities. Nonspecific interventricular conduction delay. Normal axis. J-point elevation in V3 and V4. Similar to prior  EKG. ____________________________________________  LABS (pertinent positives/negatives)  White blood count 8.2, hemoglobin 8.5 and platelet count 205 Basic metabolic panel without significant abnormalities Troponin less than 0.03  ____________________________________________  RADIOLOGY All Xrays were viewed by me. Imaging interpreted by Radiologist.  Chest x-ray one view portable: Negative __________________________________________  PROCEDURES  Procedure(s) performed: None  Critical Care performed: None  ____________________________________________   ED COURSE / ASSESSMENT AND PLAN  Pertinent labs & imaging results that were available during my care of the patient  were reviewed by me and considered in my medical decision making (see chart for details).   Patient has no hypoxia and is 100% on room air. She did receive one breathing treatment by EMS, followed by 2 additional do nebs here in the emergency department with excellent resolution of her wheezing and air movement.  Patient states she thinks that she got worked up into a panic over frustration with the pharmacy.  She does refused to take prednisone and did decline slightly Medrol. I did talk her into taking a dose of Decadron here.  She is breathing much better now, no wheezing, no tachypnea or retractions. Good air movement throughout. Her son is here at bedside with her.  Her EKG was similar to prior and she is having no chest pain. It appears she's had some baseline tachycardia in the past as well. Her heart rate now after the DuoNeb is around 105 110 bpm and she is calm and comfortable and asking to go home. I do think this result. She can follow-up with a primary care physician.    CONSULTATIONS:   None   Patient / Family / Caregiver informed of clinical course, medical decision-making process, and agree with plan.   I discussed return precautions, follow-up instructions, and discharged instructions with  patient and/or family.   ___________________________________________   FINAL CLINICAL IMPRESSION(S) / ED DIAGNOSES   Final diagnoses:  COPD exacerbation (HCC)              Note: This dictation was prepared with Dragon dictation. Any transcriptional errors that result from this process are unintentional   Governor Rooks, MD 02/26/15 2101

## 2015-02-26 NOTE — Discharge Instructions (Signed)
You were evaluated for trouble breathing and found to have wheezing/COPD exacerbation. Continue your albuterol nebulizer or inhaler every 4 hours as needed for shortness of breath.  Return to the emergency department for any worsening condition including trouble breathing, wheezing, chest pain, or any other symptoms concerning to you.   Chronic Obstructive Pulmonary Disease Exacerbation Chronic obstructive pulmonary disease (COPD) is a common lung problem. In COPD, the flow of air from the lungs is limited. COPD exacerbations are times that breathing gets worse and you need extra treatment. Without treatment they can be life threatening. If they happen often, your lungs can become more damaged. If your COPD gets worse, your doctor may treat you with:  Medicines.  Oxygen.  Different ways to clear your airway, such as using a mask. HOME CARE  Do not smoke.  Avoid tobacco smoke and other things that bother your lungs.  If given, take your antibiotic medicine as told. Finish the medicine even if you start to feel better.  Only take medicines as told by your doctor.  Drink enough fluids to keep your pee (urine) clear or pale yellow (unless your doctor has told you not to).  Use a cool mist machine (vaporizer).  If you use oxygen or a machine that turns liquid medicine into a mist (nebulizer), continue to use them as told.  Keep up with shots (vaccinations) as told by your doctor.  Exercise regularly.  Eat healthy foods.  Keep all doctor visits as told. GET HELP RIGHT AWAY IF:  You are very short of breath and it gets worse.  You have trouble talking.  You have bad chest pain.  You have blood in your spit (sputum).  You have a fever.  You keep throwing up (vomiting).  You feel weak, or you pass out (faint).  You feel confused.  You keep getting worse. MAKE SURE YOU:  Understand these instructions.  Will watch your condition.  Will get help right away if you are  not doing well or get worse.   This information is not intended to replace advice given to you by your health care provider. Make sure you discuss any questions you have with your health care provider.   Document Released: 12/15/2010 Document Revised: 01/16/2014 Document Reviewed: 08/30/2012 Elsevier Interactive Patient Education [employer]

## 2015-02-26 NOTE — ED Notes (Addendum)
Pt arrives via EMS from home c/o SOB. Pt was discharged from hospital this past Friday for cardiac changes. When EMS arrived on scene today pt was having a very difficult time breathing with accessory muscle use. Pt has hx of COPD, does not wear oxygen at home. EMS gave pt two duonebs.

## 2015-03-01 ENCOUNTER — Other Ambulatory Visit: Payer: Self-pay | Admitting: Internal Medicine

## 2015-03-01 DIAGNOSIS — J432 Centrilobular emphysema: Secondary | ICD-10-CM

## 2015-03-01 DIAGNOSIS — M4806 Spinal stenosis, lumbar region: Secondary | ICD-10-CM | POA: Diagnosis not present

## 2015-03-01 DIAGNOSIS — F411 Generalized anxiety disorder: Secondary | ICD-10-CM | POA: Diagnosis not present

## 2015-03-01 DIAGNOSIS — I10 Essential (primary) hypertension: Secondary | ICD-10-CM | POA: Diagnosis not present

## 2015-03-01 DIAGNOSIS — R911 Solitary pulmonary nodule: Secondary | ICD-10-CM

## 2015-03-01 DIAGNOSIS — Z09 Encounter for follow-up examination after completed treatment for conditions other than malignant neoplasm: Secondary | ICD-10-CM | POA: Diagnosis not present

## 2015-03-01 DIAGNOSIS — I2510 Atherosclerotic heart disease of native coronary artery without angina pectoris: Secondary | ICD-10-CM | POA: Diagnosis not present

## 2015-03-05 ENCOUNTER — Ambulatory Visit
Admission: RE | Admit: 2015-03-05 | Discharge: 2015-03-05 | Disposition: A | Discharge status: Home / Self Care | Payer: PPO | Source: Ambulatory Visit | Attending: Internal Medicine | Admitting: Internal Medicine

## 2015-03-05 DIAGNOSIS — R911 Solitary pulmonary nodule: Secondary | ICD-10-CM | POA: Diagnosis not present

## 2015-03-05 DIAGNOSIS — R918 Other nonspecific abnormal finding of lung field: Secondary | ICD-10-CM | POA: Diagnosis not present

## 2015-03-05 DIAGNOSIS — J432 Centrilobular emphysema: Secondary | ICD-10-CM | POA: Diagnosis not present

## 2015-03-05 DIAGNOSIS — I2510 Atherosclerotic heart disease of native coronary artery without angina pectoris: Secondary | ICD-10-CM | POA: Insufficient documentation

## 2015-03-05 DIAGNOSIS — Z09 Encounter for follow-up examination after completed treatment for conditions other than malignant neoplasm: Secondary | ICD-10-CM

## 2015-03-05 MED ORDER — IOHEXOL 300 MG/ML  SOLN
60.0000 mL | Freq: Once | INTRAMUSCULAR | Status: AC | PRN
  Administered 2015-03-05: 60 mL via INTRAVENOUS

## 2015-03-08 DIAGNOSIS — R0902 Hypoxemia: Secondary | ICD-10-CM | POA: Diagnosis not present

## 2015-03-08 DIAGNOSIS — J439 Emphysema, unspecified: Secondary | ICD-10-CM | POA: Diagnosis not present

## 2015-03-08 DIAGNOSIS — R0602 Shortness of breath: Secondary | ICD-10-CM | POA: Diagnosis not present

## 2015-03-09 DIAGNOSIS — J449 Chronic obstructive pulmonary disease, unspecified: Secondary | ICD-10-CM | POA: Diagnosis not present

## 2015-03-26 DIAGNOSIS — J432 Centrilobular emphysema: Secondary | ICD-10-CM | POA: Diagnosis not present

## 2015-03-26 DIAGNOSIS — Z130 Encounter for screening for diseases of the blood and blood-forming organs and certain disorders involving the immune mechanism: Secondary | ICD-10-CM | POA: Diagnosis not present

## 2015-03-26 DIAGNOSIS — J209 Acute bronchitis, unspecified: Secondary | ICD-10-CM | POA: Diagnosis not present

## 2015-03-26 DIAGNOSIS — I2510 Atherosclerotic heart disease of native coronary artery without angina pectoris: Secondary | ICD-10-CM | POA: Diagnosis not present

## 2015-03-26 DIAGNOSIS — I10 Essential (primary) hypertension: Secondary | ICD-10-CM | POA: Diagnosis not present

## 2015-03-29 ENCOUNTER — Encounter: Payer: Self-pay | Admitting: *Deleted

## 2015-03-29 ENCOUNTER — Emergency Department: Payer: PPO

## 2015-03-29 ENCOUNTER — Inpatient Hospital Stay
Admission: EM | Admit: 2015-03-29 | Discharge: 2015-04-01 | DRG: 190 | Disposition: A | Discharge status: Home / Self Care | Payer: PPO | Attending: Internal Medicine | Admitting: Internal Medicine

## 2015-03-29 DIAGNOSIS — Z885 Allergy status to narcotic agent status: Secondary | ICD-10-CM

## 2015-03-29 DIAGNOSIS — R0600 Dyspnea, unspecified: Secondary | ICD-10-CM

## 2015-03-29 DIAGNOSIS — G2581 Restless legs syndrome: Secondary | ICD-10-CM | POA: Diagnosis not present

## 2015-03-29 DIAGNOSIS — R0689 Other abnormalities of breathing: Secondary | ICD-10-CM | POA: Diagnosis not present

## 2015-03-29 DIAGNOSIS — J9620 Acute and chronic respiratory failure, unspecified whether with hypoxia or hypercapnia: Secondary | ICD-10-CM | POA: Diagnosis present

## 2015-03-29 DIAGNOSIS — J441 Chronic obstructive pulmonary disease with (acute) exacerbation: Principal | ICD-10-CM | POA: Diagnosis present

## 2015-03-29 DIAGNOSIS — I10 Essential (primary) hypertension: Secondary | ICD-10-CM | POA: Diagnosis not present

## 2015-03-29 DIAGNOSIS — Z955 Presence of coronary angioplasty implant and graft: Secondary | ICD-10-CM | POA: Diagnosis not present

## 2015-03-29 DIAGNOSIS — R0602 Shortness of breath: Secondary | ICD-10-CM | POA: Diagnosis not present

## 2015-03-29 DIAGNOSIS — D638 Anemia in other chronic diseases classified elsewhere: Secondary | ICD-10-CM | POA: Diagnosis present

## 2015-03-29 DIAGNOSIS — Z743 Need for continuous supervision: Secondary | ICD-10-CM | POA: Diagnosis not present

## 2015-03-29 DIAGNOSIS — I5023 Acute on chronic systolic (congestive) heart failure: Secondary | ICD-10-CM | POA: Diagnosis present

## 2015-03-29 DIAGNOSIS — J449 Chronic obstructive pulmonary disease, unspecified: Secondary | ICD-10-CM | POA: Diagnosis not present

## 2015-03-29 DIAGNOSIS — Z79899 Other long term (current) drug therapy: Secondary | ICD-10-CM

## 2015-03-29 DIAGNOSIS — Z7951 Long term (current) use of inhaled steroids: Secondary | ICD-10-CM | POA: Diagnosis not present

## 2015-03-29 DIAGNOSIS — Z888 Allergy status to other drugs, medicaments and biological substances status: Secondary | ICD-10-CM

## 2015-03-29 DIAGNOSIS — R609 Edema, unspecified: Secondary | ICD-10-CM

## 2015-03-29 DIAGNOSIS — I110 Hypertensive heart disease with heart failure: Secondary | ICD-10-CM | POA: Diagnosis not present

## 2015-03-29 DIAGNOSIS — Z9981 Dependence on supplemental oxygen: Secondary | ICD-10-CM

## 2015-03-29 DIAGNOSIS — Z87891 Personal history of nicotine dependence: Secondary | ICD-10-CM | POA: Diagnosis not present

## 2015-03-29 DIAGNOSIS — J811 Chronic pulmonary edema: Secondary | ICD-10-CM | POA: Diagnosis not present

## 2015-03-29 DIAGNOSIS — R0603 Acute respiratory distress: Secondary | ICD-10-CM

## 2015-03-29 DIAGNOSIS — J90 Pleural effusion, not elsewhere classified: Secondary | ICD-10-CM | POA: Diagnosis not present

## 2015-03-29 LAB — BASIC METABOLIC PANEL
Anion gap: 9 (ref 5–15)
BUN: 10 mg/dL (ref 6–20)
CALCIUM: 9 mg/dL (ref 8.9–10.3)
CO2: 23 mmol/L (ref 22–32)
CREATININE: 0.85 mg/dL (ref 0.44–1.00)
Chloride: 103 mmol/L (ref 101–111)
Glucose, Bld: 179 mg/dL — ABNORMAL HIGH (ref 65–99)
Potassium: 4.7 mmol/L (ref 3.5–5.1)
SODIUM: 135 mmol/L (ref 135–145)

## 2015-03-29 LAB — CBC
HCT: 28.1 % — ABNORMAL LOW (ref 35.0–47.0)
Hemoglobin: 8.4 g/dL — ABNORMAL LOW (ref 12.0–16.0)
MCH: 18.8 pg — AB (ref 26.0–34.0)
MCHC: 29.8 g/dL — AB (ref 32.0–36.0)
MCV: 63 fL — ABNORMAL LOW (ref 80.0–100.0)
PLATELETS: 257 10*3/uL (ref 150–440)
RBC: 4.46 MIL/uL (ref 3.80–5.20)
RDW: 19.8 % — ABNORMAL HIGH (ref 11.5–14.5)
WBC: 5.6 10*3/uL (ref 3.6–11.0)

## 2015-03-29 LAB — BRAIN NATRIURETIC PEPTIDE: B Natriuretic Peptide: 417 pg/mL — ABNORMAL HIGH (ref 0.0–100.0)

## 2015-03-29 LAB — TROPONIN I

## 2015-03-29 MED ORDER — GABAPENTIN 300 MG PO CAPS
ORAL_CAPSULE | ORAL | Status: AC
  Administered 2015-03-29: 300 mg
  Filled 2015-03-29: qty 1

## 2015-03-29 MED ORDER — DEXTROSE 5 % IV SOLN
500.0000 mg | Freq: Once | INTRAVENOUS | Status: AC
  Administered 2015-03-29: 500 mg via INTRAVENOUS
  Filled 2015-03-29: qty 500

## 2015-03-29 MED ORDER — GABAPENTIN 600 MG PO TABS: 300.0000 mg | ORAL_TABLET | ORAL | Status: AC

## 2015-03-29 MED ORDER — TRAMADOL HCL 50 MG PO TABS
ORAL_TABLET | ORAL | Status: AC
  Filled 2015-03-29: qty 1

## 2015-03-29 MED ORDER — IPRATROPIUM-ALBUTEROL 0.5-2.5 (3) MG/3ML IN SOLN
3.0000 mL | Freq: Once | RESPIRATORY_TRACT | Status: AC
  Administered 2015-03-29 (×2): 3 mL via RESPIRATORY_TRACT

## 2015-03-29 MED ORDER — TRAMADOL HCL 50 MG PO TABS
50.0000 mg | ORAL_TABLET | ORAL | Status: AC
  Administered 2015-03-29: 50 mg via ORAL

## 2015-03-29 MED ORDER — IPRATROPIUM-ALBUTEROL 0.5-2.5 (3) MG/3ML IN SOLN
RESPIRATORY_TRACT | Status: AC
  Administered 2015-03-29: 3 mL via RESPIRATORY_TRACT
  Filled 2015-03-29: qty 3

## 2015-03-29 MED ORDER — METHYLPREDNISOLONE SODIUM SUCC 125 MG IJ SOLR
125.0000 mg | INTRAMUSCULAR | Status: AC
  Administered 2015-03-29: 125 mg via INTRAVENOUS
  Filled 2015-03-29: qty 2

## 2015-03-29 MED ORDER — FUROSEMIDE 10 MG/ML IJ SOLN
20.0000 mg | Freq: Once | INTRAMUSCULAR | Status: AC
  Administered 2015-03-29: 20 mg via INTRAVENOUS
  Filled 2015-03-29: qty 4

## 2015-03-29 NOTE — ED Provider Notes (Signed)
[facility] Emergency Department Provider Note  ____________________________________________  Time seen: Approximately 9:27 PM  I have reviewed the triage vital signs and the nursing notes.   HISTORY  Chief Complaint Respiratory Distress  Shortness of breath  EM caveat: The patient is on BiPAP, respiratory distress   HPI Tanya Rhodes is a 75 y.o. female carries a history of COPDand hypertension.  Patient reportedly developed shortness of breath just prior to arrival and wheezing. On EMS arrival she was clearly dyspneic, had poor air movement and had an oxygen saturation of 80% with wheezing.  Patient was placed on CPAP and given 1 DuoNeb. The patient has limits and history as she is on BiPAP, which she reports that she is better. Denies pain. Reports she started feeling short of breath and wheezing just about 20 minutes before EMS arrived. She denies any fevers or chills. This had a slight dry cough.   Past Medical History  Diagnosis Date  . COPD (chronic obstructive pulmonary disease) (HCC)   . Hypertension     Patient Active Problem List   Diagnosis Date Noted  . COPD exacerbation (HCC) 02/18/2015    History reviewed. No pertinent past surgical history.  Current Outpatient Rx  Name  Route  Sig  Dispense  Refill  . acetaminophen (TYLENOL) 500 MG tablet   Oral   Take 500 mg by mouth every 6 (six) hours as needed for mild pain, moderate pain, fever or headache.          . albuterol (PROVENTIL HFA;VENTOLIN HFA) 108 (90 Base) MCG/ACT inhaler   Inhalation   Inhale 2 puffs into the lungs every 6 (six) hours as needed for wheezing or shortness of breath.          Marland Kitchen albuterol (PROVENTIL) (2.5 MG/3ML) 0.083% nebulizer solution   Nebulization   Take 2.5 mg by nebulization every 6 (six) hours as needed for wheezing or shortness of breath.         . ALPRAZolam (XANAX) 0.25 MG tablet   Oral   Take 0.25 mg by mouth at bedtime as needed for  anxiety or sleep.         . budesonide-formoterol (SYMBICORT) 160-4.5 MCG/ACT inhaler   Inhalation   Inhale 2 puffs into the lungs 2 (two) times daily.         Marland Kitchen escitalopram (LEXAPRO) 5 MG tablet   Oral   Take 10 mg by mouth at bedtime.          . ferrous gluconate (FERGON) 324 MG tablet   Oral   Take 324 mg by mouth daily with breakfast.         . fluticasone (FLONASE) 50 MCG/ACT nasal spray   Each Nare   Place 2 sprays into both nostrils daily.         Marland Kitchen gabapentin (NEURONTIN) 300 MG capsule   Oral   Take 600 mg by mouth at bedtime.         . potassium chloride SA (K-DUR,KLOR-CON) 20 MEQ tablet   Oral   Take 20 mEq by mouth daily.      0   . ranitidine (ZANTAC) 150 MG tablet   Oral   Take 150 mg by mouth 2 (two) times daily.         . simvastatin (ZOCOR) 20 MG tablet   Oral   Take 20 mg by mouth at bedtime.         Marland Kitchen SPIRIVA RESPIMAT 2.5 MCG/ACT AERS  Inhalation   Inhale 1 capsule into the lungs daily.       0     Dispense as written.   . traMADol (ULTRAM) 50 MG tablet   Oral   Take 75 mg by mouth 2 (two) times daily as needed for moderate pain.          . vitamin B-12 (CYANOCOBALAMIN) 1000 MCG tablet   Oral   Take 1,000 mcg by mouth at bedtime.         . Vitamin D, Ergocalciferol, (DRISDOL) 50000 units CAPS capsule   Oral   Take 50,000 Units by mouth every 7 (seven) days. Pt takes on Saturday.         . levofloxacin (LEVAQUIN) 500 MG tablet   Oral   Take 1 tablet (500 mg total) by mouth daily. Patient not taking: Reported on 02/26/2015   5 tablet   0     Allergies Ropinirole; Oxycodone; and Prednisone  Family History  Problem Relation Age of Onset  . Heart disease Other     Social History Social History  Substance Use Topics  . Smoking status: Former Games developermoker  . Smokeless tobacco: None  . Alcohol Use: No    Review of Systems Constitutional: No fever/chills Cardiovascular: Denies chest pain. Respiratory: See  history of present illness. Reports feeling improvement now.  Gastrointestinal: No abdominal pain.  No nausea, no vomiting.  Genitourinary: Negative for dysuria. Musculoskeletal: Negative for back pain. Skin: Negative for rash. Neurological: Negative for headaches, focal weakness or numbness.  10-point ROS otherwise negative.  ____________________________________________   PHYSICAL EXAM:  VITAL SIGNS: ED Triage Vitals  Enc Vitals Group     BP --      Pulse --      Resp --      Temp --      Temp src --      SpO2 --      Weight --      Height --      Head Cir --      Peak Flow --      Pain Score --      Pain Loc --      Pain Edu? --      Excl. in GC? --    Constitutional: Alert and oriented. Patient appears mildly dyspneic, sitting upright. Eyes: Conjunctivae are normal. PERRL. EOMI. Head: Atraumatic. Mouth/Throat: Mucous membranes are moist.  Oropharynx non-erythematous. Neck: No stridor.   Cardiovascular: Just slightly tachycardic rate, regular rhythm. Grossly normal heart sounds.  Good peripheral circulation. Respiratory: Moderate increased risk of breathing, mild use accessory muscles. She is taking good air movement and lungs with only slight bilateral wheezing on BiPAP. Tolerating well. Gastrointestinal: Soft and nontender. Musculoskeletal: No lower extremity tenderness nor edema.  No joint effusions. Neurologic:  Normal speech and language are very difficult to speak over the BiPAP. No gross focal neurologic deficits are appreciated. Skin:  Skin is warm, dry and intact. No rash noted. Psychiatric: Mood and affect are slightly anxious.  ____________________________________________   LABS (all labs ordered are listed, but only abnormal results are displayed)  Labs Reviewed  CBC - Abnormal; Notable for the following:    Hemoglobin 8.4 (*)    HCT 28.1 (*)    MCV 63.0 (*)    MCH 18.8 (*)    MCHC 29.8 (*)    RDW 19.8 (*)    All other components within normal  limits  BASIC METABOLIC PANEL - Abnormal; Notable for the  following:    Glucose, Bld 179 (*)    All other components within normal limits  BRAIN NATRIURETIC PEPTIDE - Abnormal; Notable for the following:    B Natriuretic Peptide 417.0 (*)    All other components within normal limits  CULTURE, BLOOD (ROUTINE X 2)  CULTURE, BLOOD (ROUTINE X 2)  TROPONIN I   ____________________________________________  EKG  Reviewed and interpreted by me at 2125 Heart rate 110 QRS 1:30 QTc 480 Sinus tachycardia, left bundle branch block. No obvious acute ischemic abnormality is noted.  Plan at the previous EKG left bundle branch block is pre-existing. ____________________________________________  RADIOLOGY     DG Chest [facility] (Final result) Result time: 03/29/15 22:12:21   Final result by Rad Results In Interface (03/29/15 22:12:21)   Narrative:   CLINICAL DATA: Respiratory distress. Decreased oxygen saturation.  EXAM: PORTABLE CHEST 1 VIEW  COMPARISON: 02/26/2015  FINDINGS: Mild cardiac enlargement with central and peripheral interstitial changes suggesting interstitial pneumonia or edema. No significant vascular congestion. Small bilateral pleural effusions are new since previous study. No pneumothorax. Mediastinal contours appear intact. Calcified and tortuous aorta. Thoracolumbar scoliosis.  IMPRESSION: Cardiac enlargement. Diffuse interstitial pattern to the lungs suggesting interstitial pneumonia or edema. Small bilateral pleural effusions are new since previous study.   Electronically Signed By: Burman Nieves M.D. On: 03/29/2015 22:12    ____________________________________________   PROCEDURES  Procedure(s) performed: None  Critical Care performed: Yes, see critical care note(s)  CRITICAL CARE Performed by: Sharyn Creamer   Total critical care time: 35 minutes  Critical care time was exclusive of separately billable procedures and treating  other patients.  Critical care was necessary to treat or prevent imminent or life-threatening deterioration.  Critical care was time spent personally by me on the following activities: development of treatment plan with patient and/or surrogate as well as nursing, discussions with consultants, evaluation of patient's response to treatment, examination of patient, obtaining history from patient or surrogate, ordering and performing treatments and interventions, ordering and review of laboratory studies, ordering and review of radiographic studies, pulse oximetry and re-evaluation of patient's condition.  Patient requiring BiPAP, appears likely COPD exacerbation. ____________________________________________   INITIAL IMPRESSION / ASSESSMENT AND PLAN / ED COURSE  Pertinent labs & imaging results that were available during my care of the patient were reviewed by me and considered in my medical decision making (see chart for details).  Patient presents with acute respiratory distress. Patient does show signs of improvement on BiPAP, is tolerating well. Oxygenation improved.  Patient does carry a history of COPD, but in reviewing x-rays and labs she does not carry a clear history of congestive heart failure though chest x-ray is concerning for possible interstitial edema. Condition BNP elevated. No evidence of pneumothorax. Patient responding well to nebulizers.  We will initiate small dose Lasix, monitor closely, initiate antibiotics in the event this is potential pneumonia though she does not have fever or leukocytosis at this time.  Ongoing care and workup of the hospitalist service. Overall patient improving. ____________________________________________   FINAL CLINICAL IMPRESSION(S) / ED DIAGNOSES  Final diagnoses:  Interstitial edema  COPD exacerbation (HCC)  Respiratory distress  Dyspnea      Sharyn Creamer, MD 03/29/15 2306

## 2015-03-29 NOTE — ED Notes (Signed)
2nd set of blood cultures drawn 

## 2015-03-29 NOTE — ED Notes (Signed)
Pt arrived to ED via EMS Emergency traffic due to respiratory distress. Pt with hx of COPD, states using albuterol inhaler this evening with no relief. Per EMS pt sating 80% on RA, placed on cpap on scene, given 1 Duoneb en route. Upon arrival to ED pt sating 100% on Bipap, expiratory wheezes throughout. Vitals stable at this time. MD Quale and RT at bedside upon arrival.

## 2015-03-30 ENCOUNTER — Encounter: Payer: Self-pay | Admitting: Internal Medicine

## 2015-03-30 DIAGNOSIS — I5023 Acute on chronic systolic (congestive) heart failure: Secondary | ICD-10-CM | POA: Diagnosis not present

## 2015-03-30 DIAGNOSIS — G2581 Restless legs syndrome: Secondary | ICD-10-CM | POA: Diagnosis not present

## 2015-03-30 DIAGNOSIS — I110 Hypertensive heart disease with heart failure: Secondary | ICD-10-CM | POA: Diagnosis not present

## 2015-03-30 DIAGNOSIS — Z87891 Personal history of nicotine dependence: Secondary | ICD-10-CM | POA: Diagnosis not present

## 2015-03-30 DIAGNOSIS — J449 Chronic obstructive pulmonary disease, unspecified: Secondary | ICD-10-CM | POA: Diagnosis not present

## 2015-03-30 DIAGNOSIS — Z79899 Other long term (current) drug therapy: Secondary | ICD-10-CM | POA: Diagnosis not present

## 2015-03-30 DIAGNOSIS — J9620 Acute and chronic respiratory failure, unspecified whether with hypoxia or hypercapnia: Secondary | ICD-10-CM | POA: Diagnosis present

## 2015-03-30 DIAGNOSIS — R0602 Shortness of breath: Secondary | ICD-10-CM | POA: Diagnosis not present

## 2015-03-30 DIAGNOSIS — R0600 Dyspnea, unspecified: Secondary | ICD-10-CM | POA: Diagnosis present

## 2015-03-30 DIAGNOSIS — R0689 Other abnormalities of breathing: Secondary | ICD-10-CM | POA: Diagnosis not present

## 2015-03-30 DIAGNOSIS — Z955 Presence of coronary angioplasty implant and graft: Secondary | ICD-10-CM | POA: Diagnosis not present

## 2015-03-30 DIAGNOSIS — Z9981 Dependence on supplemental oxygen: Secondary | ICD-10-CM | POA: Diagnosis not present

## 2015-03-30 DIAGNOSIS — Z7951 Long term (current) use of inhaled steroids: Secondary | ICD-10-CM | POA: Diagnosis not present

## 2015-03-30 DIAGNOSIS — I10 Essential (primary) hypertension: Secondary | ICD-10-CM | POA: Diagnosis not present

## 2015-03-30 DIAGNOSIS — J441 Chronic obstructive pulmonary disease with (acute) exacerbation: Secondary | ICD-10-CM | POA: Diagnosis not present

## 2015-03-30 DIAGNOSIS — D638 Anemia in other chronic diseases classified elsewhere: Secondary | ICD-10-CM | POA: Diagnosis not present

## 2015-03-30 DIAGNOSIS — Z888 Allergy status to other drugs, medicaments and biological substances status: Secondary | ICD-10-CM | POA: Diagnosis not present

## 2015-03-30 DIAGNOSIS — Z885 Allergy status to narcotic agent status: Secondary | ICD-10-CM | POA: Diagnosis not present

## 2015-03-30 DIAGNOSIS — J90 Pleural effusion, not elsewhere classified: Secondary | ICD-10-CM | POA: Diagnosis not present

## 2015-03-30 DIAGNOSIS — Z743 Need for continuous supervision: Secondary | ICD-10-CM | POA: Diagnosis not present

## 2015-03-30 LAB — BASIC METABOLIC PANEL
Anion gap: 9 (ref 5–15)
BUN: 10 mg/dL (ref 6–20)
CHLORIDE: 98 mmol/L — AB (ref 101–111)
CO2: 24 mmol/L (ref 22–32)
CREATININE: 0.76 mg/dL (ref 0.44–1.00)
Calcium: 8.3 mg/dL — ABNORMAL LOW (ref 8.9–10.3)
GFR calc Af Amer: >60 mL/min (ref >60)
GLUCOSE: 172 mg/dL — AB (ref 65–99)
Potassium: 3.7 mmol/L (ref 3.5–5.1)
SODIUM: 131 mmol/L — AB (ref 135–145)

## 2015-03-30 LAB — BLOOD CULTURE ID PANEL (REFLEXED)
ACINETOBACTER BAUMANNII: NOT DETECTED
CANDIDA KRUSEI: NOT DETECTED
CARBAPENEM RESISTANCE: NOT DETECTED
Candida albicans: NOT DETECTED
Candida glabrata: NOT DETECTED
Candida parapsilosis: NOT DETECTED
Candida tropicalis: NOT DETECTED
ENTEROBACTERIACEAE SPECIES: NOT DETECTED
Enterobacter cloacae complex: NOT DETECTED
Enterococcus species: NOT DETECTED
Escherichia coli: NOT DETECTED
Haemophilus influenzae: NOT DETECTED
KLEBSIELLA OXYTOCA: NOT DETECTED
Klebsiella pneumoniae: NOT DETECTED
LISTERIA MONOCYTOGENES: NOT DETECTED
Methicillin resistance: DETECTED — AB
NEISSERIA MENINGITIDIS: NOT DETECTED
PSEUDOMONAS AERUGINOSA: NOT DETECTED
Proteus species: NOT DETECTED
STAPHYLOCOCCUS SPECIES: DETECTED — AB
STREPTOCOCCUS PYOGENES: NOT DETECTED
STREPTOCOCCUS SPECIES: NOT DETECTED
Serratia marcescens: NOT DETECTED
Staphylococcus aureus (BCID): NOT DETECTED
Streptococcus agalactiae: NOT DETECTED
Streptococcus pneumoniae: NOT DETECTED
VANCOMYCIN RESISTANCE: NOT DETECTED

## 2015-03-30 LAB — CBC
HEMATOCRIT: 23.8 % — AB (ref 35.0–47.0)
Hemoglobin: 7.3 g/dL — ABNORMAL LOW (ref 12.0–16.0)
MCH: 18.7 pg — ABNORMAL LOW (ref 26.0–34.0)
MCHC: 30.8 g/dL — AB (ref 32.0–36.0)
MCV: 60.6 fL — AB (ref 80.0–100.0)
PLATELETS: 213 10*3/uL (ref 150–440)
RBC: 3.92 MIL/uL (ref 3.80–5.20)
RDW: 19.4 % — AB (ref 11.5–14.5)
WBC: 6.6 10*3/uL (ref 3.6–11.0)

## 2015-03-30 LAB — GLUCOSE, CAPILLARY: Glucose-Capillary: 180 mg/dL — ABNORMAL HIGH (ref 65–99)

## 2015-03-30 LAB — MRSA PCR SCREENING: MRSA by PCR: NEGATIVE

## 2015-03-30 MED ORDER — SODIUM CHLORIDE 0.9 % IV SOLN: 250.0000 mL | INTRAVENOUS | Status: DC | PRN

## 2015-03-30 MED ORDER — SENNOSIDES-DOCUSATE SODIUM 8.6-50 MG PO TABS: 1.0000 | ORAL_TABLET | Freq: Every evening | ORAL | Status: DC | PRN

## 2015-03-30 MED ORDER — VITAMIN D (ERGOCALCIFEROL) 1.25 MG (50000 UNIT) PO CAPS
50000.0000 [IU] | ORAL_CAPSULE | ORAL | Status: DC
Start: 2015-04-03 — End: 2015-04-01

## 2015-03-30 MED ORDER — SODIUM CHLORIDE 0.9% FLUSH: 3.0000 mL | INTRAVENOUS | Status: DC | PRN

## 2015-03-30 MED ORDER — VITAMIN B-12 1000 MCG PO TABS
1000.0000 ug | ORAL_TABLET | Freq: Every day | ORAL | Status: DC
Start: 2015-03-30 — End: 2015-04-01
  Administered 2015-03-30 – 2015-03-31 (×3): 1000 ug via ORAL
  Filled 2015-03-30 (×3): qty 1

## 2015-03-30 MED ORDER — IPRATROPIUM-ALBUTEROL 0.5-2.5 (3) MG/3ML IN SOLN
3.0000 mL | Freq: Four times a day (QID) | RESPIRATORY_TRACT | Status: DC
  Administered 2015-03-30 – 2015-04-01 (×9): 3 mL via RESPIRATORY_TRACT
  Filled 2015-03-30 (×10): qty 3

## 2015-03-30 MED ORDER — DEXTROSE 5 % IV SOLN
500.0000 mg | INTRAVENOUS | Status: DC
  Administered 2015-03-30 – 2015-03-31 (×2): 500 mg via INTRAVENOUS
  Filled 2015-03-30 (×5): qty 500

## 2015-03-30 MED ORDER — ESCITALOPRAM OXALATE 10 MG PO TABS
10.0000 mg | ORAL_TABLET | Freq: Every day | ORAL | Status: DC
  Administered 2015-03-30 – 2015-03-31 (×3): 10 mg via ORAL
  Filled 2015-03-30 (×3): qty 1

## 2015-03-30 MED ORDER — TRAMADOL HCL 50 MG PO TABS
25.0000 mg | ORAL_TABLET | Freq: Once | ORAL | Status: AC
  Administered 2015-03-30: 25 mg via ORAL
  Filled 2015-03-30: qty 1

## 2015-03-30 MED ORDER — SIMVASTATIN 20 MG PO TABS
20.0000 mg | ORAL_TABLET | Freq: Every day | ORAL | Status: DC
  Administered 2015-03-30 – 2015-03-31 (×3): 20 mg via ORAL
  Filled 2015-03-30 (×3): qty 1

## 2015-03-30 MED ORDER — TIOTROPIUM BROMIDE MONOHYDRATE 18 MCG IN CAPS
18.0000 ug | ORAL_CAPSULE | Freq: Every day | RESPIRATORY_TRACT | Status: DC
Start: 2015-03-30 — End: 2015-04-01
  Administered 2015-03-30 – 2015-04-01 (×3): 18 ug via RESPIRATORY_TRACT
  Filled 2015-03-30: qty 5

## 2015-03-30 MED ORDER — CHLORHEXIDINE GLUCONATE 0.12 % MT SOLN
15.0000 mL | Freq: Two times a day (BID) | OROMUCOSAL | Status: DC
  Administered 2015-03-30 – 2015-03-31 (×5): 15 mL via OROMUCOSAL
  Filled 2015-03-30 (×5): qty 15

## 2015-03-30 MED ORDER — MOMETASONE FURO-FORMOTEROL FUM 100-5 MCG/ACT IN AERO
2.0000 | INHALATION_SPRAY | Freq: Two times a day (BID) | RESPIRATORY_TRACT | Status: DC
  Administered 2015-03-30 – 2015-04-01 (×5): 2 via RESPIRATORY_TRACT
  Filled 2015-03-30: qty 8.8

## 2015-03-30 MED ORDER — FERROUS GLUCONATE 324 (38 FE) MG PO TABS
324.0000 mg | ORAL_TABLET | Freq: Every day | ORAL | Status: DC
  Administered 2015-03-30 – 2015-04-01 (×3): 324 mg via ORAL
  Filled 2015-03-30 (×3): qty 1

## 2015-03-30 MED ORDER — ENOXAPARIN SODIUM 40 MG/0.4ML ~~LOC~~ SOLN
40.0000 mg | SUBCUTANEOUS | Status: DC
  Administered 2015-03-30 – 2015-03-31 (×2): 40 mg via SUBCUTANEOUS
  Filled 2015-03-30 (×2): qty 0.4

## 2015-03-30 MED ORDER — SODIUM CHLORIDE 0.9% FLUSH
3.0000 mL | Freq: Two times a day (BID) | INTRAVENOUS | Status: DC
  Administered 2015-03-30: 3 mL via INTRAVENOUS
  Administered 2015-03-30: 22:00:00 via INTRAVENOUS
  Administered 2015-03-30 – 2015-04-01 (×4): 3 mL via INTRAVENOUS

## 2015-03-30 MED ORDER — ALPRAZOLAM 0.25 MG PO TABS
0.2500 mg | ORAL_TABLET | Freq: Every evening | ORAL | Status: DC | PRN
  Administered 2015-03-30 (×2): 0.25 mg via ORAL
  Filled 2015-03-30 (×2): qty 1

## 2015-03-30 MED ORDER — CETYLPYRIDINIUM CHLORIDE 0.05 % MT LIQD
7.0000 mL | Freq: Two times a day (BID) | OROMUCOSAL | Status: DC
  Administered 2015-03-30 – 2015-03-31 (×3): 7 mL via OROMUCOSAL

## 2015-03-30 MED ORDER — ONDANSETRON HCL 4 MG/2ML IJ SOLN: 4.0000 mg | Freq: Four times a day (QID) | INTRAMUSCULAR | Status: DC | PRN

## 2015-03-30 MED ORDER — SODIUM CHLORIDE 0.9% FLUSH
3.0000 mL | Freq: Two times a day (BID) | INTRAVENOUS | Status: DC
  Administered 2015-03-30: 3 mL via INTRAVENOUS
  Administered 2015-03-30: 22:00:00 via INTRAVENOUS
  Administered 2015-03-31 – 2015-04-01 (×3): 3 mL via INTRAVENOUS

## 2015-03-30 MED ORDER — DEXTROSE 5 % IV SOLN
1.0000 g | INTRAVENOUS | Status: DC
  Administered 2015-03-30 – 2015-04-01 (×3): 1 g via INTRAVENOUS
  Filled 2015-03-30 (×4): qty 10

## 2015-03-30 MED ORDER — TRAMADOL HCL 50 MG PO TABS
75.0000 mg | ORAL_TABLET | Freq: Two times a day (BID) | ORAL | Status: DC | PRN
  Administered 2015-03-30 – 2015-03-31 (×3): 75 mg via ORAL
  Filled 2015-03-30 (×3): qty 2

## 2015-03-30 MED ORDER — ONDANSETRON HCL 4 MG PO TABS: 4.0000 mg | ORAL_TABLET | Freq: Four times a day (QID) | ORAL | Status: DC | PRN

## 2015-03-30 MED ORDER — POTASSIUM CHLORIDE CRYS ER 20 MEQ PO TBCR
20.0000 meq | EXTENDED_RELEASE_TABLET | Freq: Every day | ORAL | Status: DC
  Administered 2015-03-30 – 2015-04-01 (×3): 20 meq via ORAL
  Filled 2015-03-30 (×3): qty 1

## 2015-03-30 MED ORDER — METHYLPREDNISOLONE SODIUM SUCC 40 MG IJ SOLR
20.0000 mg | Freq: Every day | INTRAMUSCULAR | Status: DC
  Administered 2015-03-30 – 2015-04-01 (×3): 20 mg via INTRAVENOUS
  Filled 2015-03-30 (×3): qty 1

## 2015-03-30 MED ORDER — FUROSEMIDE 10 MG/ML IJ SOLN
40.0000 mg | Freq: Every day | INTRAMUSCULAR | Status: DC
  Administered 2015-03-30 – 2015-04-01 (×3): 40 mg via INTRAVENOUS
  Filled 2015-03-30 (×3): qty 4

## 2015-03-30 NOTE — Progress Notes (Signed)
Pt may transfer to med surg per Dr. Clovis FredricksonKasa's verbal orders

## 2015-03-30 NOTE — Plan of Care (Signed)
Problem: Fall Risk Goal: Pt will remain free from injury and falls. Outcome: Met  Pt was able to remain free from falls on my shift and, show appropriate safety awareness by using the call light when needing to use the restroom.  Problem Risk for inadequate perfusion. Goal Pt's dypsnea will be controlled at rest. Outcome Met  Pt was able to "breathe easier" during my shift. Pt remain on 3L [place] today and did not show increased work of breathing. Pt was given steroids, and Lasix this shift.  Pt states she is "more comftorable breathing"

## 2015-03-30 NOTE — H&P (Signed)
[facility]   PATIENT NAME: Tanya Rhodes    MR#:  [id number]  DATE OF BIRTH:  1940-09-10  DATE OF ADMISSION:  03/29/2015  PRIMARY CARE PHYSICIAN: Barbette Reichmann, MD   REQUESTING/REFERRING PHYSICIAN:   CHIEF COMPLAINT:   Chief Complaint  Patient presents with  . Respiratory Distress    HISTORY OF PRESENT ILLNESS: Tanya Rhodes  is a 75 y.o. female with a known history of COPD , hypertension on home oxygen presented to the emergency room with shortness of breath for the last couple of days. It has been having worsening shortness of breath for the last few days. Tried some breathing treatments at home but did not help. Oxygen saturation was around 80% on room air and when patient presented to the emergency room, patient was put on BiPAP and stabilized. Patient was worked up in the emergency room chest x-ray showed COPD changes and interstitial edema in the lungs. No complaints of any chest pain. No history of any orthopnea or proximal nocturnal dyspnea. No fever or chills. Hospitalist service was consulted for further care of the patient. Also complaints of pain in the legs which is on and off.  PAST MEDICAL HISTORY:   Past Medical History  Diagnosis Date  . COPD (chronic obstructive pulmonary disease) (HCC)   . Hypertension     PAST SURGICAL HISTORY: Past Surgical History  Procedure Laterality Date  . Colectomy    . Coronary angioplasty with stent placement      SOCIAL HISTORY:  Social History  Substance Use Topics  . Smoking status: Former Games developer  . Smokeless tobacco: Not on file  . Alcohol Use: No    FAMILY HISTORY:  Family History  Problem Relation Age of Onset  . Heart disease Brother   . Heart disease Sister     DRUG ALLERGIES:  Allergies  Allergen Reactions  . Ropinirole Anxiety and Other (See Comments)    Reaction:  Severe leg pain   . Oxycodone Other (See Comments)    Reaction:  Makes pt hyper   .  Prednisone Other (See Comments)    Reaction:  Makes pt hyper     REVIEW OF SYSTEMS:   CONSTITUTIONAL: No fever, has weakness.  EYES: No blurred or double vision.  EARS, NOSE, AND THROAT: No tinnitus or ear pain.  RESPIRATORY: No cough, has shortness of breath,has wheezing, no hemoptysis.  CARDIOVASCULAR: No chest pain, orthopnea, edema.  GASTROINTESTINAL: No nausea, vomiting, diarrhea or abdominal pain.  GENITOURINARY: No dysuria, hematuria.  ENDOCRINE: No polyuria, nocturia,  HEMATOLOGY: No anemia, easy bruising or bleeding SKIN: No rash or lesion. MUSCULOSKELETAL: No joint pain or arthritis.  Has pain in the legs. NEUROLOGIC: No tingling, numbness, weakness.  PSYCHIATRY: No anxiety or depression.   MEDICATIONS AT HOME:  Prior to Admission medications   Medication Sig Start Date End Date Taking? Authorizing Provider  acetaminophen (TYLENOL) 500 MG tablet Take 500 mg by mouth every 6 (six) hours as needed for mild pain, moderate pain, fever or headache.    Yes Historical Provider, MD  albuterol (PROVENTIL HFA;VENTOLIN HFA) 108 (90 Base) MCG/ACT inhaler Inhale 2 puffs into the lungs every 6 (six) hours as needed for wheezing or shortness of breath.    Yes Historical Provider, MD  albuterol (PROVENTIL) (2.5 MG/3ML) 0.083% nebulizer solution Take 2.5 mg by nebulization every 6 (six) hours as needed for wheezing or shortness of breath.   Yes Historical Provider, MD  ALPRAZolam Prudy Feeler) 0.25 MG tablet  Take 0.25 mg by mouth at bedtime as needed for anxiety or sleep.   Yes Historical Provider, MD  budesonide-formoterol (SYMBICORT) 160-4.5 MCG/ACT inhaler Inhale 2 puffs into the lungs 2 (two) times daily.   Yes Historical Provider, MD  escitalopram (LEXAPRO) 5 MG tablet Take 10 mg by mouth at bedtime.    Yes Historical Provider, MD  ferrous gluconate (FERGON) 324 MG tablet Take 324 mg by mouth daily with breakfast.   Yes Historical Provider, MD  fluticasone (FLONASE) 50 MCG/ACT nasal spray Place  2 sprays into both nostrils daily.   Yes Historical Provider, MD  gabapentin (NEURONTIN) 300 MG capsule Take 600 mg by mouth at bedtime.   Yes Historical Provider, MD  potassium chloride SA (K-DUR,KLOR-CON) 20 MEQ tablet Take 20 mEq by mouth daily. 03/26/15  Yes Historical Provider, MD  ranitidine (ZANTAC) 150 MG tablet Take 150 mg by mouth 2 (two) times daily.   Yes Historical Provider, MD  simvastatin (ZOCOR) 20 MG tablet Take 20 mg by mouth at bedtime.   Yes Historical Provider, MD  SPIRIVA RESPIMAT 2.5 MCG/ACT AERS Inhale 1 capsule into the lungs daily.  03/02/15  Yes Historical Provider, MD  traMADol (ULTRAM) 50 MG tablet Take 75 mg by mouth 2 (two) times daily as needed for moderate pain.    Yes Historical Provider, MD  vitamin B-12 (CYANOCOBALAMIN) 1000 MCG tablet Take 1,000 mcg by mouth at bedtime.   Yes Historical Provider, MD  Vitamin D, Ergocalciferol, (DRISDOL) 50000 units CAPS capsule Take 50,000 Units by mouth every 7 (seven) days. Pt takes on Saturday.   Yes Historical Provider, MD  levofloxacin (LEVAQUIN) 500 MG tablet Take 1 tablet (500 mg total) by mouth daily. Patient not taking: Reported on 02/26/2015 02/19/15   Delfino Lovett, MD      PHYSICAL EXAMINATION:   VITAL SIGNS: Blood pressure 104/41, pulse 112, temperature 97.5 F (36.4 C), temperature source Oral, resp. rate 26, height 5' (1.524 m), weight 49.896 kg (110 lb), SpO2 100 %.  GENERAL:  75 y.o.-year-old patient lying in the bed with no acute distress.  EYES: Pupils equal, round, reactive to light and accommodation. No scleral icterus. Extraocular muscles intact.  HEENT: Head atraumatic, normocephalic. Oropharynx and nasopharynx clear.  NECK:  Supple, no jugular venous distention. No thyroid enlargement, no tenderness.  LUNGS: Decreased breath sounds bilaterally, wheezing in both the lungs. No use of accessory muscles of respiration.  CARDIOVASCULAR: S1, S2 normal. No murmurs, rubs, or gallops.  ABDOMEN: Soft, nontender,  nondistended. Bowel sounds present. No organomegaly or mass.  EXTREMITIES: No pedal edema, cyanosis, or clubbing.  NEUROLOGIC: Cranial nerves II through XII are intact. Muscle strength 5/5 in all extremities. Sensation intact. Gait normal. PSYCHIATRIC: The patient is alert and oriented x 3.  SKIN: No obvious rash, lesion, or ulcer.   LABORATORY PANEL:   CBC  Recent Labs Lab 03/29/15 2124  WBC 5.6  HGB 8.4*  HCT 28.1*  PLT 257  MCV 63.0*  MCH 18.8*  MCHC 29.8*  RDW 19.8*   ------------------------------------------------------------------------------------------------------------------  Chemistries   Recent Labs Lab 03/29/15 2124  NA 135  K 4.7  CL 103  CO2 23  GLUCOSE 179*  BUN 10  CREATININE 0.85  CALCIUM 9.0   ------------------------------------------------------------------------------------------------------------------ estimated creatinine clearance is 41.7 mL/min (by C-G formula based on Cr of 0.85). ------------------------------------------------------------------------------------------------------------------ No results for input(s): TSH, T4TOTAL, T3FREE, THYROIDAB in the last 72 hours.  Invalid input(s): FREET3   Coagulation profile No results for input(s): INR, PROTIME in the  last 168 hours. ------------------------------------------------------------------------------------------------------------------- No results for input(s): DDIMER in the last 72 hours. -------------------------------------------------------------------------------------------------------------------  Cardiac Enzymes  Recent Labs Lab 03/29/15 2124  TROPONINI <0.03   ------------------------------------------------------------------------------------------------------------------ Invalid input(s): POCBNP  ---------------------------------------------------------------------------------------------------------------  Urinalysis    Component Value Date/Time    COLORURINE Straw 04/01/2014 1733   APPEARANCEUR Clear 04/01/2014 1733   LABSPEC 1.003 04/01/2014 1733   PHURINE 8.0 04/01/2014 1733   GLUCOSEU Negative 04/01/2014 1733   HGBUR Negative 04/01/2014 1733   BILIRUBINUR Negative 04/01/2014 1733   KETONESUR Negative 04/01/2014 1733   PROTEINUR Negative 04/01/2014 1733   NITRITE Negative 04/01/2014 1733   LEUKOCYTESUR Negative 04/01/2014 1733     RADIOLOGY: Dg Chest Port 1 View  03/29/2015  CLINICAL DATA:  Respiratory distress.  Decreased oxygen saturation. EXAM: PORTABLE CHEST 1 VIEW COMPARISON:  02/26/2015 FINDINGS: Mild cardiac enlargement with central and peripheral interstitial changes suggesting interstitial pneumonia or edema. No significant vascular congestion. Small bilateral pleural effusions are new since previous study. No pneumothorax. Mediastinal contours appear intact. Calcified and tortuous aorta. Thoracolumbar scoliosis. IMPRESSION: Cardiac enlargement. Diffuse interstitial pattern to the lungs suggesting interstitial pneumonia or edema. Small bilateral pleural effusions are new since previous study. Electronically Signed   By: Burman NievesWilliam  Stevens M.D.   On: 03/29/2015 22:12    EKG: Orders placed or performed during the hospital encounter of 03/29/15  . EKG 12-Lead  . EKG 12-Lead  . ED EKG  . ED EKG    IMPRESSION AND PLAN: 75 year old female patient with history of COPD on home oxygen, hypertension presented to the emergency room with increased shortness of breath and low oxygen saturation. Admitting diagnosis 1. Acute COPD exacerbation 2. Acute on chronic respiratory failure 3. Hypertension 4. Interstitial edema on the lungs Treatment plan Admit patient to stepdown unit Continue BiPAP to keep oxygen saturation more than 92% Start patient on IV Rocephin and IV Zithromax antibiotic The patient is allergic to prednisone will avoid Solu-Medrol Breathing treatments aggressively Diurese with IV Lasix Supportive care.  All  the records are reviewed and case discussed with ED provider. Management plans discussed with the patient, family and they are in agreement.  CODE STATUS:FULL Code Status History    Date Active Date Inactive Code Status Order ID Comments User Context   02/18/2015 10:08 PM 02/19/2015  6:32 PM Full Code [id number]  Milagros LollSrikar Sudini, MD ED       TOTAL CRITICAL CARE TIME TAKING CARE OF THIS PATIENT: 55 minutes.    Ihor AustinPavan Pyreddy M.D on 03/30/2015 at 12:50 AM  Between 7am to 6pm - Pager - [phone]  After 6pm go to [email or web address] - password [email or web address] [facility]  [facility]  Office  [phone]  CC: Primary care physician; Barbette ReichmannHANDE,VISHWANATH, MD

## 2015-03-30 NOTE — Progress Notes (Signed)
eLink Physician-Brief Progress Note Patient Name: Tanya NoonCarolyn F Vanauken DOB: Apr 16, 1940 MRN: [id number]   Date of Service  03/30/2015  HPI/Events of Note  Patient admitted with acute respiratory failure and COPD exacerbation. Nurse contacted me reporting she does have restless leg syndrome and chronically takes Neurontin as well as Ultram. Received 50 mg of Ultram and 300 mg of Neurontin previously. Patient reports to the nurse that she chronically takes 75 mg of Ultram on an as-needed basis twice daily. Complaining of significant pain in her legs.  eICU Interventions  1. Ultram 25 mg by mouth 1 now 2. Ultram 75 mg by mouth every 12 hours when necessary moderate or severe pain 3. Remainder of care per primary service     Intervention Category Evaluation Type: New Patient Evaluation  Lawanda CousinsJennings Jastin Fore 03/30/2015, 3:37 AM

## 2015-03-30 NOTE — Progress Notes (Signed)
Pt is calm, alert and oriented x4 at this time. One time complaint of pain from "my restless legs" that was controlled by Tramadol 75 mg.  Pt has been sleeping most of my shift.   Report given to Del on coming RN.

## 2015-03-30 NOTE — Progress Notes (Signed)
[facility] [place] [facility]   PATIENT NAME: Tanya Rhodes    MR#:  [id number]  DATE OF BIRTH:  12/29/1940  SUBJECTIVE:  CHIEF COMPLAINT:   Chief Complaint  Patient presents with  . Respiratory Distress  Feeling much better,   REVIEW OF SYSTEMS:  Review of Systems  Constitutional: Negative for fever, weight loss, malaise/fatigue and diaphoresis.  HENT: Negative for ear discharge, ear pain, hearing loss, nosebleeds, sore throat and tinnitus.   Eyes: Negative for blurred vision and pain.  Respiratory: Positive for cough, shortness of breath and wheezing. Negative for hemoptysis.   Cardiovascular: Negative for chest pain, palpitations, orthopnea and leg swelling.  Gastrointestinal: Negative for heartburn, nausea, vomiting, abdominal pain, diarrhea, constipation and blood in stool.  Genitourinary: Negative for dysuria, urgency and frequency.  Musculoskeletal: Negative for myalgias and back pain.  Skin: Negative for itching and rash.  Neurological: Negative for dizziness, tingling, tremors, focal weakness, seizures, weakness and headaches.  Psychiatric/Behavioral: Negative for depression. The patient is not nervous/anxious.     DRUG ALLERGIES:   Allergies  Allergen Reactions  . Ropinirole Anxiety and Other (See Comments)    Reaction:  Severe leg pain   . Oxycodone Other (See Comments)    Reaction:  Makes pt hyper   . Prednisone Other (See Comments)    Reaction:  Makes pt hyper    VITALS:  Blood pressure 110/57, pulse 99, temperature 98.2 F (36.8 C), temperature source Tympanic, resp. rate 24, height 5' (1.524 m), weight 49.2 kg (108 lb 7.5 oz), SpO2 99 %. PHYSICAL EXAMINATION:  Physical Exam  Constitutional: She is oriented to person, place, and time and well-developed, well-nourished, and in no distress.  HENT:  Head: Normocephalic and atraumatic.  Eyes: Conjunctivae and EOM are normal. Pupils are equal, round, and reactive to light.   Neck: Normal range of motion. Neck supple. No tracheal deviation present. No thyromegaly present.  Cardiovascular: Normal rate, regular rhythm and normal heart sounds.   Pulmonary/Chest: She is in respiratory distress. She has wheezes. She exhibits no tenderness.  Abdominal: Soft. Bowel sounds are normal. She exhibits no distension. There is no tenderness.  Musculoskeletal: Normal range of motion.  Neurological: She is alert and oriented to person, place, and time. No cranial nerve deficit.  Skin: Skin is warm and dry. No rash noted.  Psychiatric: Mood and affect normal.   LABORATORY PANEL:   CBC  Recent Labs Lab 03/30/15 0436  WBC 6.6  HGB 7.3*  HCT 23.8*  PLT 213   ------------------------------------------------------------------------------------------------------------------ Chemistries   Recent Labs Lab 03/30/15 0436  NA 131*  K 3.7  CL 98*  CO2 24  GLUCOSE 172*  BUN 10  CREATININE 0.76  CALCIUM 8.3*   RADIOLOGY:  Dg Chest Port 1 View  03/29/2015  CLINICAL DATA:  Respiratory distress.  Decreased oxygen saturation. EXAM: PORTABLE CHEST 1 VIEW COMPARISON:  02/26/2015 FINDINGS: Mild cardiac enlargement with central and peripheral interstitial changes suggesting interstitial pneumonia or edema. No significant vascular congestion. Small bilateral pleural effusions are new since previous study. No pneumothorax. Mediastinal contours appear intact. Calcified and tortuous aorta. Thoracolumbar scoliosis. IMPRESSION: Cardiac enlargement. Diffuse interstitial pattern to the lungs suggesting interstitial pneumonia or edema. Small bilateral pleural effusions are new since previous study. Electronically Signed   By: Burman NievesWilliam  Stevens M.D.   On: 03/29/2015 22:12   ASSESSMENT AND PLAN:   75 year old female patient with history of COPD on home oxygen, hypertension presented to the emergency room with increased shortness  of breath and low oxygen saturation.  1. Acute COPD  exacerbation: off BIPAP, on N.C., can stop rocephin, continue zithromax 2. Acute on chronic respiratory failure: due to 1 3. Hypertension: controlled. 4. Interstitial edema on the lungs: continue IV lasix.       All the records are reviewed and case discussed with Care Management/Social Worker. Management plans discussed with the patient, family and they are in agreement.  CODE STATUS: FULL CODE  TOTAL TIME (critical care) TAKING CARE OF THIS PATIENT: 35 minutes.   More than 50% of the time was spent in counseling/coordination of care: YES  POSSIBLE D/C IN 1-2 DAYS, DEPENDING ON CLINICAL CONDITION.   [facility], Vermelle Cammarata M.D on 03/30/2015 at 5:14 PM  Between 7am to 6pm - Pager - [phone]  After 6pm go to [email or web address] - password [email or web address]  [facility]  Office  [phone]  CC: Primary care physician; Barbette Reichmann, MD  Note: This dictation was prepared with Dragon dictation along with smaller phrase technology. Any transcriptional errors that result from this process are unintentional.

## 2015-03-30 NOTE — Progress Notes (Signed)
Initial Nutrition Assessment      INTERVENTION:  Meals and snacks: Cater to pt preferences Medical Nutrition Supplement Therapy: Will add mightyshake BID for added nutrition   NUTRITION DIAGNOSIS:    (reassess on follow-up) related to   as evidenced by  .    GOAL:   Patient will meet greater than or equal to 90% of their needs    MONITOR:   PO intake, Supplement acceptance  REASON FOR ASSESSMENT:   Malnutrition Screening Tool    ASSESSMENT:      Pt admitted with COPD exacerbation.  Past Medical History  Diagnosis Date  . COPD (chronic obstructive pulmonary disease) (HCC)   . Hypertension     Current Nutrition: unable to speak with pt this pm as nursing working with pt.  Noted pt ate 55% of lunch  Food/Nutrition-Related History: poor po intake prior to admission per Malnutrition Screening Tool   Scheduled Medications:  . antiseptic oral rinse  7 mL Mouth Rinse q12n4p  . azithromycin  500 mg Intravenous Q24H  . cefTRIAXone (ROCEPHIN)  IV  1 g Intravenous Q24H  . chlorhexidine  15 mL Mouth Rinse BID  . enoxaparin (LOVENOX) injection  40 mg Subcutaneous Q24H  . escitalopram  10 mg Oral QHS  . ferrous gluconate  324 mg Oral Q breakfast  . furosemide  40 mg Intravenous Daily  . gabapentin  300 mg Oral STAT  . ipratropium-albuterol  3 mL Nebulization Q6H  . methylPREDNISolone (SOLU-MEDROL) injection  20 mg Intravenous Daily  . mometasone-formoterol  2 puff Inhalation BID  . potassium chloride SA  20 mEq Oral Daily  . simvastatin  20 mg Oral QHS  . sodium chloride flush  3 mL Intravenous Q12H  . sodium chloride flush  3 mL Intravenous Q12H  . tiotropium  18 mcg Inhalation Daily  . vitamin B-12  1,000 mcg Oral QHS  . [START ON 04/03/2015] Vitamin D (Ergocalciferol)  50,000 Units Oral Q7 days       Electrolyte/Renal Profile and Glucose Profile:   Recent Labs Lab 03/29/15 2124 03/30/15 0436  NA 135 131*  K 4.7 3.7  CL 103 98*  CO2 23 24  BUN 10 10   CREATININE 0.85 0.76  CALCIUM 9.0 8.3*  GLUCOSE 179* 172*    Gastrointestinal Profile: Last BM: 3/20   Nutrition-Focused Physical Exam Findings: deferred at this time   Weight Change: wt stable wt except for on 02/26/15 wt of 117 (?? Accuracy)  Wt Readings from Last 10 Encounters:  03/30/15 108 lb 7.5 oz (49.2 kg)  02/26/15 117 lb (53.071 kg)  02/19/15 107 lb (48.535 kg)  07/15/14 105 lb (47.628 kg     Diet Order:  Diet 2 gram sodium Room service appropriate?: Yes; Fluid consistency:: Thin  Skin:   reviewed  Height:   Ht Readings from Last 1 Encounters:  03/30/15 5' (1.524 m)    Weight:   Wt Readings from Last 1 Encounters:  03/30/15 108 lb 7.5 oz (49.2 kg)    Ideal Body Weight:     BMI:  Body mass index is 21.18 kg/(m^2).  Estimated Nutritional Needs:   Kcal:  BEE 911 kcals (IF 1.0-1.2, AF 1.3) [id number] kcals/d  Protein:  (1.0-1.2 g/kg) 49-59 g/d  Fluid:  (30-5835ml/kg) 1470-17715ml/d  EDUCATION NEEDS:   No education needs identified at this time  MODERATE Care Level  Quetzali Heinle B. Freida BusmanAllen, RD, LDN [phone] (pager) Weekend/On-Call pager [phone])

## 2015-03-30 NOTE — Progress Notes (Signed)
Pharmacy Antibiotic Note  Tanya Rhodes is a 75 y.o. female admitted on 03/29/2015 with COPD exacerbation.  Patient is currently prescribed azithromycin day #2 and CTX day #1. Received call from lab for one bottle blood culture positive for staph species (not staph aureus) mec A detected. Patient's WBC is 6.6 and patient is afebrile.   Plan: BCID results were discussed with Dr. Anne HahnWillis and the patient's current antibiotics will be continued as this is likely a contaminant and patient is afebrile with a normal WBC. Further adjustments will be made if necessary based on final sensitivities.  Height: 5' (152.4 cm) Weight: 108 lb 7.5 oz (49.2 kg) IBW/kg (Calculated) : 45.5  Temp (24hrs), Avg:97.6 F (36.4 C), Min:96.3 F (35.7 C), Max:98.4 F (36.9 C)   Recent Labs Lab 03/29/15 2124 03/30/15 0436  WBC 5.6 6.6  CREATININE 0.85 0.76    Estimated Creatinine Clearance: 44.3 mL/min (by C-G formula based on Cr of 0.76).    Allergies  Allergen Reactions  . Ropinirole Anxiety and Other (See Comments)    Reaction:  Severe leg pain   . Oxycodone Other (See Comments)    Reaction:  Makes pt hyper   . Prednisone Other (See Comments)    Reaction:  Makes pt hyper     Antimicrobials this admission: azithromycin 3/20 >>  CTX 3/21 >>   Dose adjustments this admission: n/a  Microbiology results: 3/20 BCx: staph species, mecA in one bottle 3/20 MRSA PCR: negative  Thank you for allowing pharmacy to be a part of this patient's care.  Cindi CarbonMary M Dona Klemann, PharmD Clinical Pharmacist 03/30/2015 9:53 PM

## 2015-03-30 NOTE — Progress Notes (Signed)
Pt reports hx of restless leg syndrome.  States tramadol @75  mg prn bid.  Received 50 mg in ER.  Requesting another 25 mg to round out her usual dose.  Spoke with E-link MD.  Awaiting new orders.

## 2015-03-30 NOTE — Care Management (Signed)
Patient presents from home with shortness ob breath.  She lives with her son.  Patient says if her breathing is "not giving her trouble"  she is able to perform all of her adls.  Ambulates without any assistive device.  Does not drive.  Does not have medic alert.  Will provide brochure prior to discharge.  Her son takes her on errands and MD appointments.  Current with her PCP- Dr Marcello FennelHande.  Denies issues accessing medical care, or obtaining medications.   Has chronic home 02  and home nebulizer through [employer].  She is agreeable to home health services and agency preference is [facility]. Would benefit from home health nursing, aide and tele heatlh nursing.  Would need to be seen within 24 hours of discharge.  Notified [facility]

## 2015-03-31 ENCOUNTER — Inpatient Hospital Stay
Admit: 2015-03-31 | Discharge: 2015-03-31 | Disposition: A | Discharge status: Home / Self Care | Payer: PPO | Attending: Internal Medicine | Admitting: Internal Medicine

## 2015-03-31 LAB — BASIC METABOLIC PANEL
ANION GAP: 9 (ref 5–15)
BUN: 23 mg/dL — AB (ref 6–20)
CALCIUM: 9.2 mg/dL (ref 8.9–10.3)
CO2: 28 mmol/L (ref 22–32)
Chloride: 97 mmol/L — ABNORMAL LOW (ref 101–111)
Creatinine, Ser: 0.93 mg/dL (ref 0.44–1.00)
GFR calc Af Amer: >60 mL/min (ref >60)
GFR, EST NON AFRICAN AMERICAN: 59 mL/min — AB (ref >60)
GLUCOSE: 118 mg/dL — AB (ref 65–99)
Potassium: 3.7 mmol/L (ref 3.5–5.1)
Sodium: 134 mmol/L — ABNORMAL LOW (ref 135–145)

## 2015-03-31 LAB — CBC
HEMATOCRIT: 25.2 % — AB (ref 35.0–47.0)
Hemoglobin: 7.6 g/dL — ABNORMAL LOW (ref 12.0–16.0)
MCH: 18.5 pg — AB (ref 26.0–34.0)
MCHC: 30.4 g/dL — AB (ref 32.0–36.0)
MCV: 60.9 fL — AB (ref 80.0–100.0)
PLATELETS: 263 10*3/uL (ref 150–440)
RBC: 4.13 MIL/uL (ref 3.80–5.20)
RDW: 19.4 % — AB (ref 11.5–14.5)
WBC: 7.5 10*3/uL (ref 3.6–11.0)

## 2015-03-31 LAB — ECHOCARDIOGRAM COMPLETE
HEIGHTINCHES: 60 in
WEIGHTICAEL: 1735.46 [oz_av]

## 2015-03-31 MED ORDER — GABAPENTIN 300 MG PO CAPS
600.0000 mg | ORAL_CAPSULE | Freq: Every day | ORAL | Status: DC
  Administered 2015-03-31: 21:00:00 600 mg via ORAL
  Filled 2015-03-31: qty 2

## 2015-03-31 MED ORDER — GABAPENTIN 300 MG PO CAPS
600.0000 mg | ORAL_CAPSULE | Freq: Once | ORAL | Status: AC
  Administered 2015-03-31: 600 mg via ORAL
  Filled 2015-03-31: qty 2

## 2015-03-31 MED ORDER — LISINOPRIL 5 MG PO TABS
2.5000 mg | ORAL_TABLET | Freq: Every day | ORAL | Status: DC
  Administered 2015-03-31: 16:00:00 2.5 mg via ORAL
  Filled 2015-03-31: qty 1

## 2015-03-31 NOTE — Progress Notes (Signed)
*  PRELIMINARY RESULTS* Echocardiogram 2D Echocardiogram has been performed.  Tanya Rhodes 03/31/2015, 8:15 AM

## 2015-03-31 NOTE — Progress Notes (Signed)
*  PRELIMINARY RESULTS* Echocardiogram 2D Echocardiogram has been performed.  Tanya Rhodes 03/31/2015, 7:53 AM

## 2015-03-31 NOTE — Progress Notes (Signed)
[facility] [place] at [facility]   PATIENT NAME: Tanya Rhodes    MR#:  [id number]  DATE OF BIRTH:  1940/02/06  SUBJECTIVE:  CHIEF COMPLAINT:   Chief Complaint  Patient presents with  . Respiratory Distress  still sob, echo not read when I saw  REVIEW OF SYSTEMS:  Review of Systems  Constitutional: Negative for fever, weight loss, malaise/fatigue and diaphoresis.  HENT: Negative for ear discharge, ear pain, hearing loss, nosebleeds, sore throat and tinnitus.   Eyes: Negative for blurred vision and pain.  Respiratory: Positive for cough, shortness of breath and wheezing. Negative for hemoptysis.   Cardiovascular: Negative for chest pain, palpitations, orthopnea and leg swelling.  Gastrointestinal: Negative for heartburn, nausea, vomiting, abdominal pain, diarrhea, constipation and blood in stool.  Genitourinary: Negative for dysuria, urgency and frequency.  Musculoskeletal: Negative for myalgias and back pain.  Skin: Negative for itching and rash.  Neurological: Negative for dizziness, tingling, tremors, focal weakness, seizures, weakness and headaches.  Psychiatric/Behavioral: Negative for depression. The patient is not nervous/anxious.     DRUG ALLERGIES:   Allergies  Allergen Reactions  . Ropinirole Anxiety and Other (See Comments)    Reaction:  Severe leg pain   . Oxycodone Other (See Comments)    Reaction:  Makes pt hyper   . Prednisone Other (See Comments)    Reaction:  Makes pt hyper    VITALS:  Blood pressure 102/56, pulse 82, temperature 97.9 F (36.6 C), temperature source Oral, resp. rate 17, height 5' (1.524 m), weight 49.2 kg (108 lb 7.5 oz), SpO2 99 %. PHYSICAL EXAMINATION:  Physical Exam  Constitutional: She is oriented to person, place, and time and well-developed, well-nourished, and in no distress.  HENT:  Head: Normocephalic and atraumatic.  Eyes: Conjunctivae and EOM are normal. Pupils are equal, round, and reactive to  light.  Neck: Normal range of motion. Neck supple. No tracheal deviation present. No thyromegaly present.  Cardiovascular: Normal rate, regular rhythm and normal heart sounds.   Pulmonary/Chest: No respiratory distress. She has no wheezes. She exhibits no tenderness.  Abdominal: Soft. Bowel sounds are normal. She exhibits no distension. There is no tenderness.  Musculoskeletal: Normal range of motion.  Neurological: She is alert and oriented to person, place, and time. No cranial nerve deficit.  Skin: Skin is warm and dry. No rash noted.  Psychiatric: Mood and affect normal.   LABORATORY PANEL:   CBC  Recent Labs Lab 03/31/15 0946  WBC 7.5  HGB 7.6*  HCT 25.2*  PLT 263   ------------------------------------------------------------------------------------------------------------------ Chemistries   Recent Labs Lab 03/31/15 0946  NA 134*  K 3.7  CL 97*  CO2 28  GLUCOSE 118*  BUN 23*  CREATININE 0.93  CALCIUM 9.2   RADIOLOGY:  No results found. ASSESSMENT AND PLAN:  75 year old female patient with history of COPD on home oxygen, hypertension presented to the emergency room with increased shortness of breath and low oxygen saturation.  1. Acute COPD exacerbation: on N.C., continue zithromax 2. Acute on chronic respiratory failure: due to 1, improving 3. Hypertension: low but controlled. 4. Acute on Chronic systolic CHF: Echo showing EF 47-82%, add low dose ACE-I if BP tolerates, continue IV lasix. 5. Anemia of Chronic Dz: monitor H & H. No need for transfusion yet.    All the records are reviewed and case discussed with Care Management/Social Worker. Management plans discussed with the patient, family and they are in agreement.  CODE STATUS: FULL CODE  TOTAL TIME  TAKING CARE OF THIS PATIENT: 35 minutes.   More than 50% of the time was spent in counseling/coordination of care: YES  POSSIBLE D/C IN AM, DEPENDING ON CLINICAL CONDITION.   [facility], Ahijah Devery M.D on  03/31/2015 at 4:52 PM  Between 7am to 6pm - Pager - [phone]  After 6pm go to [email or web address] - password [email or web address]  [facility]  Office  [phone]  CC: Primary care physician; Barbette ReichmannHANDE,VISHWANATH, MD  Note: This dictation was prepared with Dragon dictation along with smaller phrase technology. Any transcriptional errors that result from this process are unintentional.

## 2015-03-31 NOTE — Plan of Care (Signed)
Problem: Spiritual Needs Goal: Ability to function at adequate level Outcome: Completed/Met Date Met:  03/31/15 Pt with no noted distress, pt on chronic o2 at 2L, pt voices any needs

## 2015-04-01 MED ORDER — LEVOFLOXACIN 500 MG PO TABS: 500.0000 mg | ORAL_TABLET | Freq: Every day | ORAL | Status: DC

## 2015-04-01 NOTE — Discharge Instructions (Signed)
Chronic Obstructive Pulmonary Disease °Chronic obstructive pulmonary disease (COPD) is a common lung problem. In COPD, the flow of air from the lungs is limited. The way your lungs work will probably never return to normal, but there are things you can do to improve your lungs and make yourself feel better. Your doctor may treat your condition with: °· Medicines. °· Oxygen. °· Lung surgery. °· Changes to your diet. °· Rehabilitation. This may involve a team of specialists. °HOME CARE °· Take all medicines as told by your doctor. °· Avoid medicines or cough syrups that dry up your airway (such as antihistamines) and do not allow you to get rid of thick spit. You do not need to avoid them if told differently by your doctor. °· If you smoke, stop. Smoking makes the problem worse. °· Avoid being around things that make your breathing worse (like smoke, chemicals, and fumes). °· Use oxygen therapy and therapy to help improve your lungs (pulmonary rehabilitation) if told by your doctor. If you need home oxygen therapy, ask your doctor if you should buy a tool to measure your oxygen level (oximeter). °· Avoid people who have a sickness you can catch (contagious). °· Avoid going outside when it is very hot, cold, or humid. °· Eat healthy foods. Eat smaller meals more often. Rest before meals. °· Stay active, but remember to also rest. °· Make sure to get all the shots (vaccines) your doctor recommends. Ask your doctor if you need a pneumonia shot. °· Learn and use tips on how to relax. °· Learn and use tips on how to control your breathing as told by your doctor. Try: °¨ Breathing in (inhaling) through your nose for 1 second. Then, pucker your lips and breath out (exhale) through your lips for 2 seconds. °¨ Putting one hand on your belly (abdomen). Breathe in slowly through your nose for 1 second. Your hand on your belly should move out. Pucker your lips and breathe out slowly through your lips. Your hand on your belly  should move in as you breathe out. °· Learn and use controlled coughing to clear thick spit from your lungs. The steps are: °1. Lean your head a little forward. °2. Breathe in deeply. °3. Try to hold your breath for 3 seconds. °4. Keep your mouth slightly open while coughing 2 times. °5. Spit any thick spit out into a tissue. °6. Rest and do the steps again 1 or 2 times as needed. °GET HELP IF: °· You cough up more thick spit than usual. °· There is a change in the color or thickness of the spit. °· It is harder to breathe than usual. °· Your breathing is faster than usual. °GET HELP RIGHT AWAY IF: °· You have shortness of breath while resting. °· You have shortness of breath that stops you from: °¨ Being able to talk. °¨ Doing normal activities. °· You chest hurts for longer than 5 minutes. °· Your skin color is more blue than usual. °· Your pulse oximeter shows that you have low oxygen for longer than 5 minutes. °MAKE SURE YOU: °· Understand these instructions. °· Will watch your condition. °· Will get help right away if you are not doing well or get worse. °  °This information is not intended to replace advice given to you by your health care provider. Make sure you discuss any questions you have with your health care provider. °  °Document Released: 06/14/2007 Document Revised: 01/16/2014 Document Reviewed: 08/22/2012 °Elsevier Interactive Patient   Education ©2016 [employer] ° °

## 2015-04-01 NOTE — Consult Note (Signed)
   [facility]   04/01/2015  Chesley NoonCarolyn F Stegemann 09/27/1940 [id number]   Patient screened for potential [facility] Care Management services. Patient is eligible for [facility] Services. When this hospital liaison arrived to patient's room patient noted to be discharged. For questions please contact:   Brisha Mccabe RN, BSN [facility]  Hospital Liaison  [phone]) Business Mobile [phone]) Toll free office

## 2015-04-01 NOTE — Progress Notes (Signed)
Pt being discharged today. Iv x2 removed. Pt belongings returned to pt. Discharge paperwork reviewed with pt and pt's son, they both verified understanding. Pt wheeled out in wheelchair by staff.

## 2015-04-01 NOTE — Care Management Important Message (Signed)
Important Message  Patient Details  Name: Tanya Rhodes MRN: [id number] Date of Birth: 01/28/1940   Medicare Important Message Given:  Yes    Olegario MessierKathy A Braeden Kennan 04/01/2015, 11:27 AM

## 2015-04-01 NOTE — Plan of Care (Signed)
Pt d/ced home on O2.  Ambulates well to BR - standby asst only.  Pt has EF 20-25% and has chronic anemia.  IVs removed.  Charge nurse reviewed d/c instructions.  Pt lives w/son and he picked her up.

## 2015-04-04 NOTE — Discharge Summary (Signed)
[facility] [place] [facility]   PATIENT NAME: Tanya Rhodes    MR#:  [id number]  DATE OF BIRTH:  07-10-1940  DATE OF ADMISSION:  03/29/2015 ADMITTING PHYSICIAN: Ihor Austin, MD  DATE OF DISCHARGE: 04/01/2015 12:00 PM  PRIMARY CARE PHYSICIAN: Barbette Reichmann, MD    ADMISSION DIAGNOSIS:  Dyspnea [R06.00] Respiratory distress [R06.00] Interstitial edema [R60.9] COPD exacerbation (HCC) [J44.1]  DISCHARGE DIAGNOSIS:  Principal Problem:   Dyspnea Active Problems:   COPD exacerbation (HCC)   Acute on chronic respiratory failure (HCC)   SECONDARY DIAGNOSIS:   Past Medical History  Diagnosis Date  . COPD (chronic obstructive pulmonary disease) (HCC)   . Hypertension     HOSPITAL COURSE:  75 y.o. female with a known history of COPD , hypertension admitted with shortness of breath and low oxygen saturation.  1. Acute COPD exacerbation: improved. 2. Acute on chronic respiratory failure: due to 1, improving 3. Hypertension: low but better controlled. 4. Acute on Chronic systolic CHF: Echo showing EF 56-38%, started low dose ACE-I if BP tolerates, also on lasix. 5. Anemia of Chronic Dz: monitor H & H. No need for transfusion yet. DISCHARGE CONDITIONS:   stable  CONSULTS OBTAINED:     DRUG ALLERGIES:   Allergies  Allergen Reactions  . Ropinirole Anxiety and Other (See Comments)    Reaction:  Severe leg pain   . Oxycodone Other (See Comments)    Reaction:  Makes pt hyper   . Prednisone Other (See Comments)    Reaction:  Makes pt hyper     DISCHARGE MEDICATIONS:   Discharge Medication List as of 04/01/2015 11:27 AM    CONTINUE these medications which have CHANGED   Details  levofloxacin (LEVAQUIN) 500 MG tablet Take 1 tablet (500 mg total) by mouth daily., Starting 04/01/2015, Until Discontinued, Normal      CONTINUE these medications which have NOT CHANGED   Details  acetaminophen (TYLENOL) 500 MG tablet Take 500 mg by mouth  every 6 (six) hours as needed for mild pain, moderate pain, fever or headache. , Until Discontinued, Historical Med    albuterol (PROVENTIL HFA;VENTOLIN HFA) 108 (90 Base) MCG/ACT inhaler Inhale 2 puffs into the lungs every 6 (six) hours as needed for wheezing or shortness of breath. , Until Discontinued, Historical Med    albuterol (PROVENTIL) (2.5 MG/3ML) 0.083% nebulizer solution Take 2.5 mg by nebulization every 6 (six) hours as needed for wheezing or shortness of breath., Until Discontinued, Historical Med    ALPRAZolam (XANAX) 0.25 MG tablet Take 0.25 mg by mouth at bedtime as needed for anxiety or sleep., Until Discontinued, Historical Med    budesonide-formoterol (SYMBICORT) 160-4.5 MCG/ACT inhaler Inhale 2 puffs into the lungs 2 (two) times daily., Until Discontinued, Historical Med    escitalopram (LEXAPRO) 5 MG tablet Take 10 mg by mouth at bedtime. , Until Discontinued, Historical Med    ferrous gluconate (FERGON) 324 MG tablet Take 324 mg by mouth daily with breakfast., Until Discontinued, Historical Med    fluticasone (FLONASE) 50 MCG/ACT nasal spray Place 2 sprays into both nostrils daily., Until Discontinued, Historical Med    gabapentin (NEURONTIN) 300 MG capsule Take 600 mg by mouth at bedtime., Until Discontinued, Historical Med    potassium chloride SA (K-DUR,KLOR-CON) 20 MEQ tablet Take 20 mEq by mouth daily., Starting 03/26/2015, Until Discontinued, Historical Med    ranitidine (ZANTAC) 150 MG tablet Take 150 mg by mouth 2 (two) times daily., Until Discontinued, Historical Med    simvastatin (  ZOCOR) 20 MG tablet Take 20 mg by mouth at bedtime., Until Discontinued, Historical Med    SPIRIVA RESPIMAT 2.5 MCG/ACT AERS Inhale 1 capsule into the lungs daily. , Starting 03/02/2015, Until Discontinued, Historical Med    traMADol (ULTRAM) 50 MG tablet Take 75 mg by mouth 2 (two) times daily as needed for moderate pain. , Until Discontinued, Historical Med    vitamin B-12  (CYANOCOBALAMIN) 1000 MCG tablet Take 1,000 mcg by mouth at bedtime., Until Discontinued, Historical Med    Vitamin D, Ergocalciferol, (DRISDOL) 50000 units CAPS capsule Take 50,000 Units by mouth every 7 (seven) days. Pt takes on Saturday., Until Discontinued, Historical Med         DISCHARGE INSTRUCTIONS:    DIET:  Regular diet  DISCHARGE CONDITION:  Good  ACTIVITY:  Activity as tolerated  OXYGEN:  Home Oxygen: No.   Oxygen Delivery: room air  DISCHARGE LOCATION:  home   If you experience worsening of your admission symptoms, develop shortness of breath, life threatening emergency, suicidal or homicidal thoughts you must seek medical attention immediately by calling 911 or calling your MD immediately  if symptoms less severe.  You Must read complete instructions/literature along with all the possible adverse reactions/side effects for all the Medicines you take and that have been prescribed to you. Take any new Medicines after you have completely understood and accpet all the possible adverse reactions/side effects.   Please note  You were cared for by a hospitalist during your hospital stay. If you have any questions about your discharge medications or the care you received while you were in the hospital after you are discharged, you can call the unit and asked to speak with the hospitalist on call if the hospitalist that took care of you is not available. Once you are discharged, your primary care physician will handle any further medical issues. Please note that NO REFILLS for any discharge medications will be authorized once you are discharged, as it is imperative that you return to your primary care physician (or establish a relationship with a primary care physician if you do not have one) for your aftercare needs so that they can reassess your need for medications and monitor your lab values.    On the day of Discharge:  VITAL SIGNS:  Blood pressure 111/40, pulse 89,  temperature 98.3 F (36.8 C), temperature source Oral, resp. rate 16, height 5' (1.524 m), weight 49.2 kg (108 lb 7.5 oz), SpO2 99 %.  PHYSICAL EXAMINATION:  GENERAL:  75 y.o.-year-old patient lying in the bed with no acute distress.  EYES: Pupils equal, round, reactive to light and accommodation. No scleral icterus. Extraocular muscles intact.  HEENT: Head atraumatic, normocephalic. Oropharynx and nasopharynx clear.  NECK:  Supple, no jugular venous distention. No thyroid enlargement, no tenderness.  LUNGS: Normal breath sounds bilaterally, no wheezing, rales,rhonchi or crepitation. No use of accessory muscles of respiration.  CARDIOVASCULAR: S1, S2 normal. No murmurs, rubs, or gallops.  ABDOMEN: Soft, non-tender, non-distended. Bowel sounds present. No organomegaly or mass.  EXTREMITIES: No pedal edema, cyanosis, or clubbing.  NEUROLOGIC: Cranial nerves II through XII are intact. Muscle strength 5/5 in all extremities. Sensation intact. Gait not checked.  PSYCHIATRIC: The patient is alert and oriented x 3.  SKIN: No obvious rash, lesion, or ulcer.  DATA REVIEW:   CBC  Recent Labs Lab 03/31/15 0946  WBC 7.5  HGB 7.6*  HCT 25.2*  PLT 263    Chemistries   Recent Labs Lab 03/31/15  0946  NA 134*  K 3.7  CL 97*  CO2 28  GLUCOSE 118*  BUN 23*  CREATININE 0.93  CALCIUM 9.2    Cardiac Enzymes  Recent Labs Lab 03/29/15 2124  TROPONINI <0.03    Microbiology Results  Results for orders placed or performed during the hospital encounter of 03/29/15  Culture, blood (Routine X 2) w Reflex to ID Panel     Status: None (Preliminary result)   Collection Time: 03/29/15  9:24 PM  Result Value Ref Range Status   Specimen Description BLOOD BLOOD LEFT FOREARM  Final   Special Requests BOTTLES DRAWN AEROBIC AND ANAEROBIC 10ML  Final   Culture NO GROWTH 4 DAYS  Final   Report Status PENDING  Incomplete  Culture, blood (Routine X 2) w Reflex to ID Panel     Status: None  (Preliminary result)   Collection Time: 03/29/15 10:10 PM  Result Value Ref Range Status   Specimen Description BLOOD RIGHT WRIST  Final   Special Requests   Final    BOTTLES DRAWN AEROBIC AND ANAEROBIC 15MLANAEROBIC, 20MLAEROBIC   Culture  Setup Time   Final    GRAM POSITIVE COCCI ANAEROBIC BOTTLE ONLY CRITICAL RESULT CALLED TO, READ BACK BY AND VERIFIED WITH:  [facility] AT 2146 03/30/15 SDR    Culture   Final    COAGULASE NEGATIVE STAPHYLOCOCCUS ANAEROBIC BOTTLE ONLY Results consistent with contamination.    Report Status PENDING  Incomplete  Blood Culture ID Panel (Reflexed)     Status: Abnormal   Collection Time: 03/29/15 10:10 PM  Result Value Ref Range Status   Enterococcus species NOT DETECTED NOT DETECTED Final   Vancomycin resistance NOT DETECTED NOT DETECTED Final   Listeria monocytogenes NOT DETECTED NOT DETECTED Final   Staphylococcus species DETECTED (A) NOT DETECTED Final    Comment: CRITICAL RESULT CALLED TO, READ BACK BY AND VERIFIED WITH: [facility] SW[facility] AT 2146 03/30/15 SDR    Staphylococcus aureus NOT DETECTED NOT DETECTED Final   Methicillin resistance DETECTED (A) NOT DETECTED Final    Comment: CRITICAL RESULT CALLED TO, READ BACK BY AND VERIFIED WITH: [facility] SWAY[facility] AT 2146 03/30/15 SDR    Streptococcus species NOT DETECTED NOT DETECTED Final   Streptococcus agalactiae NOT DETECTED NOT DETECTED Final   Streptococcus pneumoniae NOT DETECTED NOT DETECTED Final   Streptococcus pyogenes NOT DETECTED NOT DETECTED Final   Acinetobacter baumannii NOT DETECTED NOT DETECTED Final   Enterobacteriaceae species NOT DETECTED NOT DETECTED Final   Enterobacter cloacae complex NOT DETECTED NOT DETECTED Final   Escherichia coli NOT DETECTED NOT DETECTED Final   Klebsiella oxytoca NOT DETECTED NOT DETECTED Final   Klebsiella pneumoniae NOT DETECTED NOT DETECTED Final   Proteus species NOT DETECTED NOT DETECTED Final   Serratia marcescens NOT DETECTED NOT DETECTED Final    Carbapenem resistance NOT DETECTED NOT DETECTED Final   Haemophilus influenzae NOT DETECTED NOT DETECTED Final   Neisseria meningitidis NOT DETECTED NOT DETECTED Final   Pseudomonas aeruginosa NOT DETECTED NOT DETECTED Final   Candida albicans NOT DETECTED NOT DETECTED Final   Candida glabrata NOT DETECTED NOT DETECTED Final   Candida krusei NOT DETECTED NOT DETECTED Final   Candida parapsilosis NOT DETECTED NOT DETECTED Final   Candida tropicalis NOT DETECTED NOT DETECTED Final  MRSA PCR Screening     Status: None   Collection Time: 03/30/15  2:12 AM  Result Value Ref Range Status   MRSA by PCR NEGATIVE NEGATIVE Final    Comment:  The GeneXpert MRSA Assay (FDA approved for NASAL specimens only), is one component of a comprehensive MRSA colonization surveillance program. It is not intended to diagnose MRSA infection nor to guide or monitor treatment for MRSA infections.     Follow-up Information    Follow up with [facility], MD. Go on 04/08/2015.   Specialty:  Internal Medicine   Why:  at 1:15 p.m. [facility] Discharge F/UP   Contact information:   [address] [place] [address] [phone]       Management plans discussed with the patient, family and they are in agreement.  CODE STATUS: FULL CODE  TOTAL TIME TAKING CARE OF THIS PATIENT: 45 minutes.    [facility], Daysen Gundrum M.D on 04/04/2015 at 12:44 PM  Between 7am to 6pm - Pager - [phone]  After 6pm go to [email or web address] - password [email or web address]  [facility]  Office  [phone]  CC: Primary care physician; Barbette Reichmann, MD   Note: This dictation was prepared with Dragon dictation along with smaller phrase technology. Any transcriptional errors that result from this process are unintentional.

## 2015-04-05 LAB — CULTURE, BLOOD (ROUTINE X 2)

## 2015-04-06 DIAGNOSIS — J449 Chronic obstructive pulmonary disease, unspecified: Secondary | ICD-10-CM | POA: Diagnosis not present

## 2015-04-07 DIAGNOSIS — I10 Essential (primary) hypertension: Secondary | ICD-10-CM | POA: Diagnosis not present

## 2015-04-07 DIAGNOSIS — I5022 Chronic systolic (congestive) heart failure: Secondary | ICD-10-CM | POA: Diagnosis not present

## 2015-04-07 DIAGNOSIS — M5416 Radiculopathy, lumbar region: Secondary | ICD-10-CM | POA: Diagnosis not present

## 2015-04-07 DIAGNOSIS — I38 Endocarditis, valve unspecified: Secondary | ICD-10-CM | POA: Diagnosis not present

## 2015-04-07 DIAGNOSIS — Z09 Encounter for follow-up examination after completed treatment for conditions other than malignant neoplasm: Secondary | ICD-10-CM | POA: Diagnosis not present

## 2015-04-07 DIAGNOSIS — D500 Iron deficiency anemia secondary to blood loss (chronic): Secondary | ICD-10-CM | POA: Diagnosis not present

## 2015-04-07 DIAGNOSIS — I2510 Atherosclerotic heart disease of native coronary artery without angina pectoris: Secondary | ICD-10-CM | POA: Diagnosis not present

## 2015-04-07 DIAGNOSIS — J432 Centrilobular emphysema: Secondary | ICD-10-CM | POA: Diagnosis not present

## 2015-04-08 ENCOUNTER — Emergency Department: Payer: PPO

## 2015-04-08 ENCOUNTER — Other Ambulatory Visit: Payer: Self-pay | Admitting: *Deleted

## 2015-04-08 ENCOUNTER — Emergency Department
Admission: EM | Admit: 2015-04-08 | Discharge: 2015-04-08 | Disposition: A | Discharge status: Home / Self Care | Payer: PPO | Attending: Emergency Medicine | Admitting: Emergency Medicine

## 2015-04-08 DIAGNOSIS — Z7951 Long term (current) use of inhaled steroids: Secondary | ICD-10-CM | POA: Diagnosis not present

## 2015-04-08 DIAGNOSIS — Z87891 Personal history of nicotine dependence: Secondary | ICD-10-CM | POA: Insufficient documentation

## 2015-04-08 DIAGNOSIS — I10 Essential (primary) hypertension: Secondary | ICD-10-CM | POA: Diagnosis not present

## 2015-04-08 DIAGNOSIS — R000 Tachycardia, unspecified: Secondary | ICD-10-CM | POA: Diagnosis not present

## 2015-04-08 DIAGNOSIS — J441 Chronic obstructive pulmonary disease with (acute) exacerbation: Secondary | ICD-10-CM | POA: Diagnosis not present

## 2015-04-08 DIAGNOSIS — J449 Chronic obstructive pulmonary disease, unspecified: Secondary | ICD-10-CM | POA: Diagnosis not present

## 2015-04-08 DIAGNOSIS — Z79899 Other long term (current) drug therapy: Secondary | ICD-10-CM | POA: Insufficient documentation

## 2015-04-08 DIAGNOSIS — Z792 Long term (current) use of antibiotics: Secondary | ICD-10-CM | POA: Diagnosis not present

## 2015-04-08 DIAGNOSIS — R0602 Shortness of breath: Secondary | ICD-10-CM | POA: Diagnosis not present

## 2015-04-08 LAB — CBC
HEMATOCRIT: 27.8 % — AB (ref 35.0–47.0)
HEMOGLOBIN: 8.6 g/dL — AB (ref 12.0–16.0)
MCH: 19 pg — ABNORMAL LOW (ref 26.0–34.0)
MCHC: 30.9 g/dL — ABNORMAL LOW (ref 32.0–36.0)
MCV: 61.3 fL — AB (ref 80.0–100.0)
PLATELETS: 482 10*3/uL — AB (ref 150–440)
RBC: 4.54 MIL/uL (ref 3.80–5.20)
RDW: 19.8 % — ABNORMAL HIGH (ref 11.5–14.5)
WBC: 6.5 10*3/uL (ref 3.6–11.0)

## 2015-04-08 LAB — BASIC METABOLIC PANEL
ANION GAP: 8 (ref 5–15)
BUN: 12 mg/dL (ref 6–20)
CHLORIDE: 99 mmol/L — AB (ref 101–111)
CO2: 27 mmol/L (ref 22–32)
Calcium: 9.9 mg/dL (ref 8.9–10.3)
Creatinine, Ser: 0.74 mg/dL (ref 0.44–1.00)
GFR calc Af Amer: >60 mL/min (ref >60)
Glucose, Bld: 102 mg/dL — ABNORMAL HIGH (ref 65–99)
Potassium: 3.6 mmol/L (ref 3.5–5.1)
SODIUM: 134 mmol/L — AB (ref 135–145)

## 2015-04-08 LAB — TROPONIN I: Troponin I: <0.03 ng/mL (ref <0.031)

## 2015-04-08 MED ORDER — IPRATROPIUM-ALBUTEROL 0.5-2.5 (3) MG/3ML IN SOLN
3.0000 mL | Freq: Once | RESPIRATORY_TRACT | Status: AC
  Administered 2015-04-08: 3 mL via RESPIRATORY_TRACT
  Filled 2015-04-08: qty 3

## 2015-04-08 NOTE — ED Notes (Signed)
Pt here with shortness of breath; reports she was just d/c last week. Pt with hx of COPD; Pt on 2L home oxygen.

## 2015-04-08 NOTE — ED Notes (Signed)
[place] mall and had increased SOB while waiting for her son. Pt had one nebulizer treatment PTA. Pt states she also has chest tightness when this occurs. 7/10 at this time. Pt in NAD. RR increased, labored with exertion. Color WNL.

## 2015-04-08 NOTE — ED Notes (Signed)
Discussed discharge instructions and follow-up care with patient. No questions or concerns at this time. Pt stable at discharge.  

## 2015-04-08 NOTE — Discharge Instructions (Signed)
Please seek medical attention for any high fevers, chest pain, shortness of breath, change in behavior, persistent vomiting, bloody stool or any other new or concerning symptoms. ° ° °Chronic Obstructive Pulmonary Disease Exacerbation °Chronic obstructive pulmonary disease (COPD) is a common lung problem. In COPD, the flow of air from the lungs is limited. COPD exacerbations are times that breathing gets worse and you need extra treatment. Without treatment they can be life threatening. If they happen often, your lungs can become more damaged. If your COPD gets worse, your doctor may treat you with: °· Medicines. °· Oxygen. °· Different ways to clear your airway, such as using a mask. °HOME CARE °· Do not smoke. °· Avoid tobacco smoke and other things that bother your lungs. °· If given, take your antibiotic medicine as told. Finish the medicine even if you start to feel better. °· Only take medicines as told by your doctor. °· Drink enough fluids to keep your pee (urine) clear or pale yellow (unless your doctor has told you not to). °· Use a cool mist machine (vaporizer). °· If you use oxygen or a machine that turns liquid medicine into a mist (nebulizer), continue to use them as told. °· Keep up with shots (vaccinations) as told by your doctor. °· Exercise regularly. °· Eat healthy foods. °· Keep all doctor visits as told. °GET HELP RIGHT AWAY IF: °· You are very short of breath and it gets worse. °· You have trouble talking. °· You have bad chest pain. °· You have blood in your spit (sputum). °· You have a fever. °· You keep throwing up (vomiting). °· You feel weak, or you pass out (faint). °· You feel confused. °· You keep getting worse. °MAKE SURE YOU: °· Understand these instructions. °· Will watch your condition. °· Will get help right away if you are not doing well or get worse. °  °This information is not intended to replace advice given to you by your health care provider. Make sure you discuss any  questions you have with your health care provider. °  °Document Released: 12/15/2010 Document Revised: 01/16/2014 Document Reviewed: 08/30/2012 °Elsevier Interactive Patient Education ©2016 Elsevier Inc. ° °

## 2015-04-08 NOTE — ED Provider Notes (Signed)
[facility] Emergency Department Provider Note    ____________________________________________  Time seen: ~1810  I have reviewed the triage vital signs and the nursing notes.   HISTORY  Chief Complaint Shortness of Breath   History limited by: Not Limited   HPI Tanya Rhodes is a 75 y.o. female with history of COPD who presents to the emergency department for concerns for shortness of breath. The patient states she was discharged from the hospital one week ago after stay for COPD exacerbation. Patient states she had been doing well until today when she was the mall. She was walking around when she started have seen some difficulty breathing. She did have some associated chest tightness. She then states that she think she started having a panic attack. At the time of my examination she had already received multiple DuoNeb's. She states she did feel better after the breathing treatments. She states that she now feels like she is back to her baseline. She denies any recent fevers.      Past Medical History  Diagnosis Date  . COPD (chronic obstructive pulmonary disease) (HCC)   . Hypertension     Patient Active Problem List   Diagnosis Date Noted  . Dyspnea 03/30/2015  . Acute on chronic respiratory failure (HCC) 03/30/2015  . COPD exacerbation (HCC) 02/18/2015    Past Surgical History  Procedure Laterality Date  . Colectomy    . Coronary angioplasty with stent placement      Current Outpatient Rx  Name  Route  Sig  Dispense  Refill  . acetaminophen (TYLENOL) 500 MG tablet   Oral   Take 500 mg by mouth every 6 (six) hours as needed for mild pain, moderate pain, fever or headache.          . albuterol (PROVENTIL HFA;VENTOLIN HFA) 108 (90 Base) MCG/ACT inhaler   Inhalation   Inhale 2 puffs into the lungs every 6 (six) hours as needed for wheezing or shortness of breath.          Marland Kitchen albuterol (PROVENTIL) (2.5 MG/3ML) 0.083% nebulizer  solution   Nebulization   Take 2.5 mg by nebulization every 6 (six) hours as needed for wheezing or shortness of breath.         . ALPRAZolam (XANAX) 0.25 MG tablet   Oral   Take 0.25 mg by mouth at bedtime as needed for anxiety or sleep.         . budesonide-formoterol (SYMBICORT) 160-4.5 MCG/ACT inhaler   Inhalation   Inhale 2 puffs into the lungs 2 (two) times daily.         Marland Kitchen escitalopram (LEXAPRO) 5 MG tablet   Oral   Take 10 mg by mouth at bedtime.          . ferrous gluconate (FERGON) 324 MG tablet   Oral   Take 324 mg by mouth daily with breakfast.         . fluticasone (FLONASE) 50 MCG/ACT nasal spray   Each Nare   Place 2 sprays into both nostrils daily.         Marland Kitchen gabapentin (NEURONTIN) 300 MG capsule   Oral   Take 600 mg by mouth at bedtime.         Marland Kitchen levofloxacin (LEVAQUIN) 500 MG tablet   Oral   Take 1 tablet (500 mg total) by mouth daily.   5 tablet   0   . potassium chloride SA (K-DUR,KLOR-CON) 20 MEQ tablet   Oral  Take 20 mEq by mouth daily.      0   . ranitidine (ZANTAC) 150 MG tablet   Oral   Take 150 mg by mouth 2 (two) times daily.         . simvastatin (ZOCOR) 20 MG tablet   Oral   Take 20 mg by mouth at bedtime.         Marland Kitchen SPIRIVA RESPIMAT 2.5 MCG/ACT AERS   Inhalation   Inhale 1 capsule into the lungs daily.       0     Dispense as written.   . traMADol (ULTRAM) 50 MG tablet   Oral   Take 75 mg by mouth 2 (two) times daily as needed for moderate pain.          . vitamin B-12 (CYANOCOBALAMIN) 1000 MCG tablet   Oral   Take 1,000 mcg by mouth at bedtime.         . Vitamin D, Ergocalciferol, (DRISDOL) 50000 units CAPS capsule   Oral   Take 50,000 Units by mouth every 7 (seven) days. Pt takes on Saturday.           Allergies Ropinirole; Oxycodone; and Prednisone  Family History  Problem Relation Age of Onset  . Heart disease Brother   . Heart disease Sister     Social History Social History   Substance Use Topics  . Smoking status: Former Games developer  . Smokeless tobacco: Not on file  . Alcohol Use: No    Review of Systems  Constitutional: Negative for fever. Cardiovascular: Negative for chest pain. Respiratory: Positive for shortness of breath Gastrointestinal: Negative for abdominal pain, vomiting and diarrhea. Neurological: Negative for headaches, focal weakness or numbness.  10-point ROS otherwise negative.  ____________________________________________   PHYSICAL EXAM:  VITAL SIGNS: ED Triage Vitals  Enc Vitals Group     BP 04/08/15 1622 152/57 mmHg     Pulse Rate 04/08/15 1622 109     Resp 04/08/15 1622 28     Temp 04/08/15 1622 98.2 F (36.8 C)     Temp Source 04/08/15 1622 Oral     SpO2 04/08/15 1622 100 %     Weight 04/08/15 1622 107 lb (48.535 kg)     Height 04/08/15 1622 5' (1.524 m)   Constitutional: Alert and oriented. Well appearing and in no distress. Eyes: Conjunctivae are normal. PERRL. Normal extraocular movements. ENT   Head: Normocephalic and atraumatic.   Nose: No congestion/rhinnorhea.   Mouth/Throat: Mucous membranes are moist.   Neck: No stridor. Hematological/Lymphatic/Immunilogical: No cervical lymphadenopathy. Cardiovascular: Tachycardic. No murmurs rubs or gallops appreciated. Respiratory: Normal respiratory effort without tachypnea nor retractions. Minimal bilateral expiratory wheezing. Gastrointestinal: Soft and nontender. No distention.  Genitourinary: Deferred Musculoskeletal: Normal range of motion in all extremities. No joint effusions.  No lower extremity tenderness nor edema. Neurologic:  Normal speech and language. No gross focal neurologic deficits are appreciated.  Skin:  Skin is warm, dry and intact. No rash noted. Psychiatric: Mood and affect are normal. Speech and behavior are normal. Patient exhibits appropriate insight and judgment.  ____________________________________________    LABS (pertinent  positives/negatives)  Labs Reviewed  BASIC METABOLIC PANEL - Abnormal; Notable for the following:    Sodium 134 (*)    Chloride 99 (*)    Glucose, Bld 102 (*)    All other components within normal limits  CBC - Abnormal; Notable for the following:    Hemoglobin 8.6 (*)    HCT 27.8 (*)  MCV 61.3 (*)    MCH 19.0 (*)    MCHC 30.9 (*)    RDW 19.8 (*)    Platelets 482 (*)    All other components within normal limits  TROPONIN I     ____________________________________________   EKG  I, Phineas SemenGraydon Jacari Iannello, attending physician, personally viewed and interpreted this EKG  EKG Time: 1618 Rate: 108 Rhythm: sinus tachycardia Axis: right axis deviation Intervals: qtc 490 QRS: nonspecific intraventricular conduction delay ST changes: no st elevation Impression: abnormal ekg  No significant change when compared to EKG dated 03/11/2015 ____________________________________________    RADIOLOGY  CXR IMPRESSION: Stable interstitial prominence.  ____________________________________________   PROCEDURES  Procedure(s) performed: None  Critical Care performed: No  ____________________________________________   INITIAL IMPRESSION / ASSESSMENT AND PLAN / ED COURSE  Pertinent labs & imaging results that were available during my care of the patient were reviewed by me and considered in my medical decision making (see chart for details).  Patient presented to the emergency department today because of concerns for shortness breath. Patient does have a history of COPD. Patient had received a couple of DuoNeb treatments prior to my evaluation. Patient did state that she felt better and felt like she was back to her baseline. I do wonder if the patient had a small reaction to a possible ear tender at the mall and then had a slight panic attack. She certainly looks good on my exam. Given that the patient states she is back to her baseline will discharge home. Patient states she wants to  go home.  ____________________________________________   FINAL CLINICAL IMPRESSION(S) / ED DIAGNOSES  Final diagnoses:  Chronic obstructive pulmonary disease, unspecified COPD type (HCC)     Phineas SemenGraydon Kariyah Baugh, MD 04/08/15 1911

## 2015-04-08 NOTE — Patient Outreach (Signed)
[facility] Care Management  04/08/2015  Tanya Rhodes Jul 22, 1940 [id number]  Subjective: Telephone call to patient's home number, no answer, left HIPAA compliant voicemail message, and requested call back.  Objective: Per Epic case review: Patient hospitalized 03/29/15 - 04/01/15 with COPD exacerbation.    Patient has has a history of acute on chronic systolic congestive heart failure, anemia of chronic disease, and hypertension.    Assessment: Received [facility] referral on 04/08/15.   Referral source: [employer].   Referral reason: Diagnosis of congestive heart failure (20 -25 %), needs ongoing education, support for congestive heart failure and COPD.  Educated also given by referral source on 04/08/15.  Patient verbalized understanding of education and voiced she would daily weights and eating low sodium diet.  States patient unfamiliar with congestive heart failure diagnosis and had never been told to take daily weights.  Patient has scale.   Patient states she is not currently on ACE-I/ ARB or Beta or fluid med.  Referral source notified patient's MD of above and assess further.  Patient lives with son, who is supportive.    Patient has had 2 admits in the last 2 months.   Patient is responsive to education about her health and wants to feel better.   Telephone screening pending, patient contact.   Plan: RNCM will call patient within 3 business days, if no return call for telephone screen completion.   Jackee Glasner H. Gardiner Barefootooper RN, BSN, CCM [facility] Telephonic CM Phone: [phone] Fax: [phone]

## 2015-04-09 ENCOUNTER — Other Ambulatory Visit: Payer: Self-pay | Admitting: *Deleted

## 2015-04-09 DIAGNOSIS — J441 Chronic obstructive pulmonary disease with (acute) exacerbation: Secondary | ICD-10-CM

## 2015-04-09 NOTE — Patient Outreach (Signed)
[facility](THN) Care Management  04/09/2015  Chesley NoonCarolyn F Bolen 1940-05-31 [id number]  Subjective: Telephone call to patient's home number, spoke with patient, and HIPAA verified.  Patient states she is doing well, only has a few minutes to talk, and currently getting ready to leave to go to work with her son.   Patient states she goes to work with her son sometimes so he can keep an eye on her, in case she has an episode and needs something.   [facility] gave [facility] verbal authorization to speak with son Johna Roles( Kevin Besser) regarding her healthcare needs as needed.   Discussed [facility] services and patient in agreement to receive services. Patient states she is in need of education regarding congestive heart failure and COPD.  States she did not realize she had these diagnoses until recently.  States her primary MD is Dr. Barbette ReichmannVishwanath Hande.  States she had home care services through [facility] after her recent hospitalization but declined further services because she could not afford the copay with each visit.  Patient in agreement with referral to [facility] for transition of care due to recent hospitalization, recent ED visit,  COPD and congestive heart failure education, disease monitoring, and community resources.   Patient in agreement with [facility] for medication review, medication assistance, and monitoring.  Patient states she sometimes has trouble affording her medications and her son has to help to obtain.   Patient states she is not eligible for Medicaid due to over the income limit.   Patient states she has RNCM's contact information on her voicemail for future use if needed.   Objective: Per Epic case review: Patient hospitalized 03/29/15 - 04/01/15 with COPD exacerbation. Patient has has a history of acute on chronic systolic congestive heart failure, anemia of chronic disease, and hypertension.   Patient seen in ED on 04/08/15 with COPD.    Assessment: Received [facility] referral on 04/08/15. Referral source: [employer]. Referral reason: Diagnosis of congestive heart failure (20 -25 %), needs ongoing education, support for congestive heart failure and COPD. Educated also given by referral source on 04/08/15. Patient verbalized understanding of education and voiced she would daily weights and eating low sodium diet. States patient unfamiliar with congestive heart failure diagnosis and had never been told to take daily weights. Patient has scale. Patient states she is not currently on ACE-I/ ARB or Beta or fluid med. Referral source notified patient's MD of above and assess further. Patient lives with son, who is supportive. Patient has had 2 admits in the last 2 months. Patient is responsive to education about her health and wants to feel better. Telephone screening completed and patient will receive [facility] services.  Patient has no telephonic RNCM needs at this time.  Plan: RNCM will [facility] for transition of care due to recent hospitalization, recent ED visit,  COPD and congestive heart failure education, disease monitoring, and community resources.    RNCM will refer [facility] for medication review, medication assistance, and monitoring.   Lochlann Mastrangelo H. Gardiner Barefootooper RN, BSN, CCM [facility] Care [facility] Telephonic CM Phone: [phone] Fax: [phone]

## 2015-04-13 ENCOUNTER — Inpatient Hospital Stay: Payer: PPO | Admitting: Hematology and Oncology

## 2015-04-14 ENCOUNTER — Other Ambulatory Visit: Payer: Self-pay | Admitting: *Deleted

## 2015-04-14 NOTE — Patient Outreach (Signed)
First attempt made to contact pt, f/u on referral from [facility] RN CM (transition of care, recent hospital discharge 3/23).    HIPPA compliant voice message left with contact number, if no response, will try again.     Shayne Alkenose M.   Pierzchala RN CCM [facility]  [phone]

## 2015-04-16 ENCOUNTER — Other Ambulatory Visit: Payer: Self-pay | Admitting: *Deleted

## 2015-04-16 NOTE — Patient Outreach (Signed)
Second attempt made to contact pt, f/[facility] on [facility].  HIPPA  Compliant voice message left with contact number.  If no response, will try again.      Shayne Alkenose M.   Artavis Cowie RN CCM [facility] Care [facility]  [phone]

## 2015-04-19 ENCOUNTER — Inpatient Hospital Stay: Payer: PPO | Attending: Hematology and Oncology | Admitting: Hematology and Oncology

## 2015-04-19 ENCOUNTER — Encounter: Payer: Self-pay | Admitting: Hematology and Oncology

## 2015-04-19 ENCOUNTER — Inpatient Hospital Stay: Payer: PPO

## 2015-04-19 VITALS — BP 133/66 | HR 93 | Temp 96.5°F | Resp 18 | Wt 104.1 lb

## 2015-04-19 DIAGNOSIS — Z86010 Personal history of colonic polyps: Secondary | ICD-10-CM | POA: Diagnosis not present

## 2015-04-19 DIAGNOSIS — R634 Abnormal weight loss: Secondary | ICD-10-CM | POA: Diagnosis not present

## 2015-04-19 DIAGNOSIS — M545 Low back pain: Secondary | ICD-10-CM | POA: Diagnosis not present

## 2015-04-19 DIAGNOSIS — I509 Heart failure, unspecified: Secondary | ICD-10-CM | POA: Diagnosis not present

## 2015-04-19 DIAGNOSIS — Z0000 Encounter for general adult medical examination without abnormal findings: Secondary | ICD-10-CM

## 2015-04-19 DIAGNOSIS — Z9049 Acquired absence of other specified parts of digestive tract: Secondary | ICD-10-CM

## 2015-04-19 DIAGNOSIS — Z79899 Other long term (current) drug therapy: Secondary | ICD-10-CM | POA: Diagnosis not present

## 2015-04-19 DIAGNOSIS — F5089 Other specified eating disorder: Secondary | ICD-10-CM | POA: Diagnosis not present

## 2015-04-19 DIAGNOSIS — Z8711 Personal history of peptic ulcer disease: Secondary | ICD-10-CM | POA: Diagnosis not present

## 2015-04-19 DIAGNOSIS — M5410 Radiculopathy, site unspecified: Secondary | ICD-10-CM | POA: Insufficient documentation

## 2015-04-19 DIAGNOSIS — I2510 Atherosclerotic heart disease of native coronary artery without angina pectoris: Secondary | ICD-10-CM | POA: Diagnosis not present

## 2015-04-19 DIAGNOSIS — R918 Other nonspecific abnormal finding of lung field: Secondary | ICD-10-CM | POA: Insufficient documentation

## 2015-04-19 DIAGNOSIS — I10 Essential (primary) hypertension: Secondary | ICD-10-CM | POA: Insufficient documentation

## 2015-04-19 DIAGNOSIS — M4800 Spinal stenosis, site unspecified: Secondary | ICD-10-CM

## 2015-04-19 DIAGNOSIS — D509 Iron deficiency anemia, unspecified: Secondary | ICD-10-CM | POA: Insufficient documentation

## 2015-04-19 DIAGNOSIS — E538 Deficiency of other specified B group vitamins: Secondary | ICD-10-CM | POA: Diagnosis not present

## 2015-04-19 DIAGNOSIS — I38 Endocarditis, valve unspecified: Secondary | ICD-10-CM | POA: Diagnosis not present

## 2015-04-19 DIAGNOSIS — D649 Anemia, unspecified: Secondary | ICD-10-CM

## 2015-04-19 DIAGNOSIS — J449 Chronic obstructive pulmonary disease, unspecified: Secondary | ICD-10-CM | POA: Diagnosis not present

## 2015-04-19 LAB — CBC WITH DIFFERENTIAL/PLATELET
Basophils Absolute: 0 10*3/uL (ref 0–0.1)
Basophils Relative: 1 %
Eosinophils Absolute: 0 10*3/uL (ref 0–0.7)
Eosinophils Relative: 1 %
HCT: 28.3 % — ABNORMAL LOW (ref 35.0–47.0)
Hemoglobin: 8.7 g/dL — ABNORMAL LOW (ref 12.0–16.0)
Lymphocytes Relative: 34 %
Lymphs Abs: 1.9 10*3/uL (ref 1.0–3.6)
MCH: 19.1 pg — ABNORMAL LOW (ref 26.0–34.0)
MCHC: 30.7 g/dL — ABNORMAL LOW (ref 32.0–36.0)
MCV: 62.2 fL — ABNORMAL LOW (ref 80.0–100.0)
Monocytes Absolute: 0.6 10*3/uL (ref 0.2–0.9)
Monocytes Relative: 11 %
Neutro Abs: 3 10*3/uL (ref 1.4–6.5)
Neutrophils Relative %: 53 %
Platelets: 240 10*3/uL (ref 150–440)
RBC: 4.55 MIL/uL (ref 3.80–5.20)
RDW: 19.8 % — ABNORMAL HIGH (ref 11.5–14.5)
WBC: 5.5 10*3/uL (ref 3.6–11.0)

## 2015-04-19 LAB — IRON AND TIBC
Iron: 18 ug/dL — ABNORMAL LOW (ref 28–170)
Saturation Ratios: 3 % — ABNORMAL LOW (ref 10.4–31.8)
TIBC: 539 ug/dL — ABNORMAL HIGH (ref 250–450)
UIBC: 521 ug/dL

## 2015-04-19 LAB — FERRITIN: Ferritin: 5 ng/mL — ABNORMAL LOW (ref 11–307)

## 2015-04-19 LAB — VITAMIN B12: Vitamin B-12: 452 pg/mL (ref 180–914)

## 2015-04-19 LAB — FOLATE: Folate: 35 ng/mL (ref >5.9)

## 2015-04-19 NOTE — Progress Notes (Signed)
. [facility] day:  04/19/2015  Chief Complaint: Tanya Rhodes is a 75 y.o. female with a history of iron deficiency anemia who is seen for reassessment.  HPI:  The patient underwent partial colectomy for multiple polyps (benign) in 2013. She notes a history of a bleeding gastric ulcer years ago. She was admitted in 05/2013 with a hemoglobin of 5.9.   She received 2 units of packed red blood cells. Colonoscopy on 05/15/2013 revealed a patent ileocolonic anastomosis and internal hemorrhoids.  EGD was normal on 05/15/2013.   She has a history of B12 deficiency and is on oral B12.  Guaiac cards were negative on 09/21/2014.  B12 was > 1500 and folate > 22.3 on 04/22/2014.  CBC on 03/26/2015 revealed a hematocrit 27.2, hemoglobin 7.7, MCV 67.2, platelets 171,000, white count 4400 with an [place] of 2050. Creatinine was 0.8.  Albumen was 4.1 with a protein of 6.1. Urinalysis revealed no red blood cells.  Ferritin was 10 (low). TSH was 1.112 (normal).  CBC on 09/21/2014 revealed a hematocrit of 35.6, hemoglobin 10, MCV 71.9, platelets 310,000, white count 4900 with an [place] of 2360.  Ferritin  was 4 (low).  Iron studies included a TIBC of 549.8 (high) with an iron saturation of 5%.  Ferritin was 3 on 04/22/2014 and 4 on 10/03/2013.  She was last seen by Dr. Oliva Bustard on 08/20/2013.  At that time, hematocrit was 39.8, hemoglobin 12.7, and MCV 83. Symptomatically, she noted some low back pain which was to be evaluated by an MRI.    Review of imaging notes a lumbar spine MRI on 09/18/2013 which showed spinal stenosis per patient report. She has received injections.  Mammogram on 07/30/2013 revealed no evidence of malignancy.  Chest CT angiogram on 04/01/2014 revealed no pulmonary embolism but a 4 mm left upper lobe pulmonary nodule for which a follow-up chest CT was recommended in one year.  She notes that her diet is good although her appetite is poor.  She eats meat  every other day.  She mostly eats chicken.  She has ice pica.  She states that oral iron causes diarrhea.  She has never received IV iron.  Symptomatically, she notes that her energy level is pretty good.  She has lost 3 pounds in the past 2 weeks.  She has some shortness of breath with exertion.  She denies any abdominal complaints. Stools are mostly loose. She recently received guaiac cards. She denies any blood in her urine.  She denies any vaginal bleeding.  She is status post hysterectomy in 1982.  She has hemorrhoids.  Past Medical History  Diagnosis Date  . COPD (chronic obstructive pulmonary disease) ([place])   . Hypertension   . IDA (iron deficiency anemia)   . Centrilobular emphysema ([place])   . Coronary artery disease involving native coronary artery of native heart without angina pectoris   . Lumbar radiculitis   . Essential hypertension   . Chronic systolic CHF (congestive heart failure) ([place])   . VHD (valvular heart disease)   . Allergic rhinitis   . Colon polyposis   . Restless leg   . PUD (peptic ulcer disease)   . Rotator cuff rupture   . Melena   . Lumbar spondylitis ([place])   . HLD (hyperlipidemia)   . Low serum vitamin D   . DDD (degenerative disc disease), lumbar   . Lumbar stenosis with neurogenic claudication     Past Surgical History  Procedure Laterality Date  . Colectomy    . Coronary angioplasty with stent placement      Family History  Problem Relation Age of Onset  . Heart disease Brother   . Heart disease Sister     Social History:  reports that she has quit smoking. She does not have any smokeless tobacco history on file. She reports that she does not drink alcohol or use illicit drugs.  She has a greater than 30 pack year smoking history.  She quit smoking in 1995.  The patient's son can not bring her to clinic on Tuesdays and Thursdays.  The patient is initially alone then later accompanied by her son today.  Allergies:  Allergies  Allergen  Reactions  . Ropinirole Anxiety and Other (See Comments)    Reaction:  Severe leg pain   . Aspirin Other (See Comments)    GIB  . Ferrous Fumarate Itching  . Ferrous Sulfate Diarrhea and Nausea And Vomiting  . Omeprazole Nausea And Vomiting  . Oxycodone Other (See Comments)    Reaction:  Makes pt hyper   . Prednisone Other (See Comments)    Reaction:  Makes pt hyper     Current Medications: Current Outpatient Prescriptions  Medication Sig Dispense Refill  . acetaminophen (TYLENOL) 500 MG tablet Take 500 mg by mouth every 6 (six) hours as needed for mild pain, moderate pain, fever or headache.     . albuterol (PROVENTIL HFA;VENTOLIN HFA) 108 (90 Base) MCG/ACT inhaler Inhale 2 puffs into the lungs every 4 (four) hours as needed for wheezing or shortness of breath.     Marland Kitchen albuterol (PROVENTIL) (2.5 MG/3ML) 0.083% nebulizer solution Take 2.5 mg by nebulization every 6 (six) hours as needed for wheezing or shortness of breath.    . ALPRAZolam (XANAX) 0.25 MG tablet Take 0.25 mg by mouth at bedtime as needed for anxiety or sleep.    . budesonide-formoterol (SYMBICORT) 160-4.5 MCG/ACT inhaler Inhale 2 puffs into the lungs 2 (two) times daily.    Marland Kitchen escitalopram (LEXAPRO) 10 MG tablet Take 10 mg by mouth daily.  0  . fluticasone (FLONASE) 50 MCG/ACT nasal spray Place 2 sprays into both nostrils daily.    . furosemide (LASIX) 20 MG tablet Take 20 mg by mouth daily.    Marland Kitchen gabapentin (NEURONTIN) 300 MG capsule Take 600 mg by mouth at bedtime.    Marland Kitchen lisinopril (PRINIVIL,ZESTRIL) 2.5 MG tablet Take 2.5 mg by mouth daily.    . ranitidine (ZANTAC) 150 MG tablet Take 150 mg by mouth 2 (two) times daily.    . simvastatin (ZOCOR) 20 MG tablet Take 20 mg by mouth at bedtime.    Marland Kitchen SPIRIVA RESPIMAT 2.5 MCG/ACT AERS Inhale 1 puff into the lungs daily.   0  . traMADol (ULTRAM) 50 MG tablet Take 75 mg by mouth 2 (two) times daily as needed for moderate pain.     . Vitamin D, Ergocalciferol, (DRISDOL) 50000 units  CAPS capsule Take 50,000 Units by mouth every 7 (seven) days. Pt takes on Saturday.     No current facility-administered medications for this visit.    Review of Systems:  GENERAL:  Feels pretty good.  No fevers or sweats.  Weight loss of 2 pounds in 3 weeks.  Baseline weight 104-107 pounds. PERFORMANCE STATUS (ECOG):  1 HEENT:  No visual changes, runny nose, sore throat, mouth sores or tenderness. Lungs: Some days breathes better than others.  Shortness of breath with exertion.  No cough.  No hemoptysis. On oxygen 2 liters/min. Cardiac:  No chest pain, palpitations, orthopnea, or PND. GI:  Loose stools.  No nausea, vomiting, diarrhea, constipation, melena or hematochezia. Hemorrhoids. GU:  No urgency, frequency, dysuria, or hematuria. Musculoskeletal:  Tendonitis.  Pain in hips down due to spinal stenosis.  No muscle tenderness. Extremities:  No pain or swelling. Skin:  No rashes or skin changes. Neuro:  Restless legs.  No headache, numbness or weakness, balance or coordination issues. Endocrine:  No diabetes, thyroid issues, hot flashes or night sweats. Psych:  No mood changes, depression or anxiety. Pain:  No focal pain. Review of systems:  All other systems reviewed and found to be negative.  Physical Exam: Blood pressure 133/66, pulse 93, temperature 96.5 F (35.8 C), temperature source Tympanic, resp. rate 18, weight 104 lb 0.9 oz (47.2 kg). GENERAL:  Thin elderly woman sitting comfortably in the exam room in no acute distress. MENTAL STATUS:  Alert and oriented to person, place and time. HEAD:  Short styled gray hair.  Normocephalic, atraumatic, face symmetric, no Cushingoid features. EYES:  Blue eyes.  Pupils equal round and reactive to light and accomodation.  No conjunctivitis or scleral icterus. ENT:  Oropharynx clear without lesion.  Dentures.  Tongue normal. Mucous membranes moist.  RESPIRATORY:  Clear to auscultation without rales, wheezes or rhonchi. CARDIOVASCULAR:   Regular rate and rhythm without murmur, rub or gallop. ABDOMEN:  Soft, non-tender, with active bowel sounds, and no hepatosplenomegaly.  No masses. SKIN:  No rashes, ulcers or lesions. EXTREMITIES: No edema, no skin discoloration or tenderness.  No palpable cords. LYMPH NODES: No palpable cervical, supraclavicular, axillary or inguinal adenopathy  NEUROLOGICAL: Unremarkable. PSYCH:  Appropriate.  No visits with results within 3 Day(s) from this visit. Latest known visit with results is:  Admission on 04/08/2015, Discharged on 04/08/2015  Component Date Value Ref Range Status  . Sodium 04/08/2015 134* 135 - 145 mmol/L Final  . Potassium 04/08/2015 3.6  3.5 - 5.1 mmol/L Final  . Chloride 04/08/2015 99* 101 - 111 mmol/L Final  . CO2 04/08/2015 27  22 - 32 mmol/L Final  . Glucose, Bld 04/08/2015 102* 65 - 99 mg/dL Final  . BUN 04/08/2015 12  6 - 20 mg/dL Final  . Creatinine, Ser 04/08/2015 0.74  0.44 - 1.00 mg/dL Final  . Calcium 04/08/2015 9.9  8.9 - 10.3 mg/dL Final  . GFR calc non Af Amer 04/08/2015 >60  >60 mL/min Final  . GFR calc Af Amer 04/08/2015 >60  >60 mL/min Final   Comment: (NOTE) The eGFR has been calculated using the CKD EPI equation. This calculation has not been validated in all clinical situations. eGFR's persistently <60 mL/min signify possible Chronic Kidney Disease.   . Anion gap 04/08/2015 8  5 - 15 Final  . Troponin I 04/08/2015 <0.03  <0.031 ng/mL Final   Comment:        NO INDICATION OF MYOCARDIAL INJURY.   . WBC 04/08/2015 6.5  3.6 - 11.0 K/uL Final  . RBC 04/08/2015 4.54  3.80 - 5.20 MIL/uL Final  . Hemoglobin 04/08/2015 8.6* 12.0 - 16.0 g/dL Final  . HCT 04/08/2015 27.8* 35.0 - 47.0 % Final  . MCV 04/08/2015 61.3* 80.0 - 100.0 fL Final  . MCH 04/08/2015 19.0* 26.0 - 34.0 pg Final  . MCHC 04/08/2015 30.9* 32.0 - 36.0 g/dL Final  . RDW 04/08/2015 19.8* 11.5 - 14.5 % Final  . Platelets 04/08/2015 482* 150 - 440 K/uL Final    Assessment:  BRAYDEN BETTERS is a 75 y.o. female with microcytic anemia consistent with iron deficiency.  She has a history of bleeding gastric ulcer years ago and colonic polyps s/p partial colectomy in 2013.  She required 2 units of PRBCs in 2015.  Colonoscopy on 05/15/2013 revealed a patent ileocolonic anastomosis and internal hemorrhoids.  EGD was normal on 05/15/2013.  Guaiac cards were negative on 09/21/2014.  Diet is modest.  Oral iron causes diarrhea.  She has a history of B12 deficiency and is on oral B12. Ferritin was 10 (low) on 03/26/2015.  She has COPD and is on oxygen.  She has a greater than 30 pack year smoking history.  Chest CT angiogram on 04/01/2014 revealed no pulmonary embolism but a 4 mm left upper lobe pulmonary nodule.  Symptomatically, she notes a poor appetite.  She has lost 3 pounds in 2 weeks.  She has shortness of breath with exertion.  She has restless legs.  She has ice pica.  Plan: 1.  Review entire medical history and diagnosis of iron deficiency.  Discuss prior history of colonic polyps and gastric ulcer. 2.  Labs today:  CBC with diff, ferritin, iron studies, B12, folate. 3.  Encourage patient to turn in guaiac cards. 4.  Preauth Venofer. 5.  Schedule screening mammogram (overdue). 6.  Anticipate follow-up chest CT: re 4 mm LUL nodule noted 03/2014. 7.  RTC in 1 week for MD assessment and initiation of IV iron.   Lequita Asal, MD  04/19/2015, 3:54 PM

## 2015-04-21 ENCOUNTER — Other Ambulatory Visit: Payer: Self-pay | Admitting: Pharmacist

## 2015-04-21 ENCOUNTER — Other Ambulatory Visit: Payer: Self-pay | Admitting: *Deleted

## 2015-04-21 NOTE — Patient Outreach (Signed)
Telephone call ([facility] for [facility] of care- discharged 3/23).  Spoke with pt, HIPPA verified.   Discussed with pt reason for call, referral received,scheduling home visit.    Pt reports her breathing is doing good,sob when overdue it,  on O2 2 L [place] 24 hours a day.  Pt reports she goes to work with her son as he keeps an eye on her breathing so it would  Be hard to say when RN CM could do a home visit.   Pt reports she is weighing daily (hx of HF), compliant with a Low Na+ diet,manages her own medications, taking them as directed.  Pt reports she f/u with her doctors, to see Heart MD 4/21, Dr. Marcello FennelHande not until June.  As discussed with pt, plan to f/u again telephonically 4/19 as part of transition of care plus pt to talk to son about possible days where RN CM could do a home visit.     Shayne Alkenose M.   Pierzchala RN CCM [facility] Care Management  [phone]

## 2015-04-21 NOTE — Patient Outreach (Signed)
[facility] Care Management  [facility]   04/21/2015  Chesley NoonCarolyn F Rhodes 18-Jan-1940 [id number]  Subjective: Tanya ManorCarolyn Rhodes is a 74yo who was [facility] for medication review and medication assistance.  Per referral, patient sometimes has trouble affording her medications and her son has to help to obtain medications.    I made outreach call to patient.  Verified DOB and address.  Explained purpose of the call and reviewed medications as well as discussed medication assistance with patient.  Patient reports difficulty affording her inhalers and ergocalciferol.  Patient reports adherence with all medications.    Objective:  Medication review completed using medication list from her primary care provider, Dr. Marcello FennelHande ([facility] electronic medical record obtained through Care Everywhere) and reviewed with patient.    Encounter Medications: Outpatient Encounter Prescriptions as of 04/21/2015  Medication Sig Note  . acetaminophen (TYLENOL) 500 MG tablet Take 500 mg by mouth every 6 (six) hours as needed for mild pain, moderate pain, fever or headache.    . albuterol (PROVENTIL HFA;VENTOLIN HFA) 108 (90 Base) MCG/ACT inhaler Inhale 2 puffs into the lungs every 4 (four) hours as needed for wheezing or shortness of breath.    Marland Kitchen. albuterol (PROVENTIL) (2.5 MG/3ML) 0.083% nebulizer solution Take 2.5 mg by nebulization every 6 (six) hours as needed for wheezing or shortness of breath.   . ALPRAZolam (XANAX) 0.25 MG tablet Take 0.25 mg by mouth at bedtime as needed for anxiety or sleep.   . budesonide-formoterol (SYMBICORT) 160-4.5 MCG/ACT inhaler Inhale 2 puffs into the lungs 2 (two) times daily.   . cyanocobalamin 1000 MCG tablet Take 1,000 mcg by mouth daily.   Marland Kitchen. escitalopram (LEXAPRO) 10 MG tablet Take 10 mg by mouth daily. 04/19/2015: Received from: External Pharmacy  . fluticasone (FLONASE) 50 MCG/ACT nasal spray Place 2 sprays into both nostrils daily.   .  furosemide (LASIX) 20 MG tablet Take 20 mg by mouth daily.   Marland Kitchen. gabapentin (NEURONTIN) 300 MG capsule Take 600 mg by mouth at bedtime.   Marland Kitchen. lisinopril (PRINIVIL,ZESTRIL) 2.5 MG tablet Take 2.5 mg by mouth daily.   . ranitidine (ZANTAC) 150 MG tablet Take 150 mg by mouth 2 (two) times daily.   . simvastatin (ZOCOR) 20 MG tablet Take 20 mg by mouth at bedtime.   Marland Kitchen. SPIRIVA RESPIMAT 2.5 MCG/ACT AERS Inhale 2 puffs into the lungs daily.    . traMADol (ULTRAM) 50 MG tablet Take 75 mg by mouth 2 (two) times daily as needed for moderate pain.    . Vitamin D, Ergocalciferol, (DRISDOL) 50000 units CAPS capsule Take 50,000 Units by mouth every 7 (seven) days. Pt takes on Saturday.    No facility-administered encounter medications on file as of 04/21/2015.    Functional Status: In your present state of health, do you have any difficulty performing the following activities: 03/30/2015 02/18/2015  Hearing? N N  Vision? N N  Difficulty concentrating or making decisions? N N  Walking or climbing stairs? Y Y  Dressing or bathing? N N  Doing errands, shopping? N N    Fall/Depression Screening: No flowsheet data found.  Assessment:  Drugs sorted by system:  Neurologic/Psychologic: alprazolam, escitalopram  Cardiovascular: furosemide, lisinopril, simvastatin  Pulmonary/Allergy: albuterol HFA and nebulizer, Symbicort, Spiriva, fluticasone nasal spray  Gastrointestinal: ranitidine  Pain: acetaminophen, gabapentin, tramadol  Vitamins/Minerals: cyanocobalamin, ergocalciferol   Duplications in therapy: none noted Gaps in therapy: beta blocker (carvedilol, metoprolol succinate, or bisoprolol) for heart failure with reduced ejection  fraction Medications to avoid in the elderly: alprazolam (increase risk of cognitive impairment, delirium, falls, fractures, and motor vehicle crashes in older adults) Drug interactions: none noted Other issues noted: Medication assistance - patient has [employer]  and currently receives Extra Help from [employer].  Patient confirms she does not qualify for medicaid as she is over the income limit.  Patient confirms copays for her medications are approximately $3.30 to 8.90.  Reviewed with patient that since she already has Extra Help, there are limited medication assistance resources available.  Reviewed qualifications for Symbicort and Spiriva patient assistance programs and patient does not qualify.  Advised patient that she may benefit from filling 90-day supplies of here medications to help reduce cost.  Also dicussed use of mail order pharmacy to reduce medication cost.  Patient reports she is not interested in using mail order pharmacy because she lives in an apartment complex.    Plan: 1.   I will send the findings of my medication review to patient's PCP.  Will recommend the use of a beta-blocker approved for heart failure if clinically appropriate.   2.  Medication assistance:  Patient is already receiving Extra Help which limits patient assistance options.  Patient plans to ask her provider about filling prescriptions for 90 days.   3.  Will close pharmacy program as patient denies any further pharmacy needs.  I have updated [facility] CMRN Rose Pierzchala.  Advised patient to call me if any future pharmacy needs arise.     Lilla Shook, Pharm.D. Pharmacy Resident [facility] [phone]

## 2015-04-22 ENCOUNTER — Encounter: Payer: Self-pay | Admitting: Hematology and Oncology

## 2015-04-22 LAB — CULTURE, BLOOD (ROUTINE X 2): CULTURE: NO GROWTH

## 2015-04-23 ENCOUNTER — Ambulatory Visit: Payer: Self-pay | Admitting: *Deleted

## 2015-04-25 ENCOUNTER — Other Ambulatory Visit: Payer: Self-pay | Admitting: Hematology and Oncology

## 2015-04-26 ENCOUNTER — Inpatient Hospital Stay: Payer: PPO

## 2015-04-26 ENCOUNTER — Inpatient Hospital Stay (HOSPITAL_BASED_OUTPATIENT_CLINIC_OR_DEPARTMENT_OTHER): Payer: PPO | Admitting: Hematology and Oncology

## 2015-04-26 VITALS — BP 116/57 | HR 92 | Temp 98.3°F | Resp 20 | Wt 103.6 lb

## 2015-04-26 VITALS — BP 127/61 | HR 82 | Resp 20

## 2015-04-26 DIAGNOSIS — D509 Iron deficiency anemia, unspecified: Secondary | ICD-10-CM | POA: Diagnosis not present

## 2015-04-26 DIAGNOSIS — Z8711 Personal history of peptic ulcer disease: Secondary | ICD-10-CM

## 2015-04-26 DIAGNOSIS — D649 Anemia, unspecified: Secondary | ICD-10-CM

## 2015-04-26 DIAGNOSIS — I10 Essential (primary) hypertension: Secondary | ICD-10-CM

## 2015-04-26 DIAGNOSIS — M545 Low back pain: Secondary | ICD-10-CM

## 2015-04-26 DIAGNOSIS — E538 Deficiency of other specified B group vitamins: Secondary | ICD-10-CM

## 2015-04-26 DIAGNOSIS — Z79899 Other long term (current) drug therapy: Secondary | ICD-10-CM | POA: Diagnosis not present

## 2015-04-26 DIAGNOSIS — J449 Chronic obstructive pulmonary disease, unspecified: Secondary | ICD-10-CM

## 2015-04-26 DIAGNOSIS — I2510 Atherosclerotic heart disease of native coronary artery without angina pectoris: Secondary | ICD-10-CM

## 2015-04-26 DIAGNOSIS — F5089 Other specified eating disorder: Secondary | ICD-10-CM

## 2015-04-26 DIAGNOSIS — R634 Abnormal weight loss: Secondary | ICD-10-CM

## 2015-04-26 DIAGNOSIS — I509 Heart failure, unspecified: Secondary | ICD-10-CM

## 2015-04-26 DIAGNOSIS — I38 Endocarditis, valve unspecified: Secondary | ICD-10-CM

## 2015-04-26 DIAGNOSIS — M4800 Spinal stenosis, site unspecified: Secondary | ICD-10-CM

## 2015-04-26 DIAGNOSIS — Z86010 Personal history of colonic polyps: Secondary | ICD-10-CM

## 2015-04-26 DIAGNOSIS — Z9049 Acquired absence of other specified parts of digestive tract: Secondary | ICD-10-CM

## 2015-04-26 DIAGNOSIS — M5410 Radiculopathy, site unspecified: Secondary | ICD-10-CM

## 2015-04-26 DIAGNOSIS — R918 Other nonspecific abnormal finding of lung field: Secondary | ICD-10-CM

## 2015-04-26 MED ORDER — SODIUM CHLORIDE 0.9 % IV SOLN
200.0000 mg | Freq: Once | INTRAVENOUS | Status: AC
  Administered 2015-04-26: 200 mg via INTRAVENOUS
  Filled 2015-04-26: qty 10

## 2015-04-26 MED ORDER — SODIUM CHLORIDE 0.9 % IV SOLN
Freq: Once | INTRAVENOUS | Status: AC
  Administered 2015-04-26: 14:00:00 via INTRAVENOUS
  Filled 2015-04-26: qty 1000

## 2015-04-26 NOTE — Progress Notes (Signed)
Pt here for Venofer Tx. [facility]. No visible blood in stool. Can't take oral iron d/t diarrhea. Energy is fair. Dizziness only if she turns to quick.  Hx COPD wears O2 continuously.

## 2015-04-26 NOTE — Progress Notes (Signed)
. [facility]-  Cancer Center  Clinic day:  04/26/2015  Chief Complaint: Tanya Rhodes is a 75 y.o. female with a history of iron deficiency anemia who is seen for review of work-up and initiation of IV iron.  HPI:  The patient was last seen in the medical oncology clinic on 04/19/2015.  At that time, she was seen for initial assessment.  She was noted to have microcytic anemia consistent with iron deficiency.  She had a history of bleeding gastric ulcer years ago and colonic polyps s/p partial colectomy in 2013.  She required transfusion in 2015.  Colonoscopy on 05/15/2013 revealed a patent ileocolonic anastomosis and internal hemorrhoids.  EGD was normal on 05/15/2013.  Guaiac cards were negative on 09/21/2014.  Her diet was modest.  Oral iron caused diarrhea.  She had a history of B12 deficiency and was on oral B12. Ferritin was 10 (low) on 03/26/2015.  She underwent a work-up:  Labs revealed a hematocrit of 28.3, hemoglobin 8.7, MCV 62.2, platelets 240,000, WBC 5500 with an ANC of 3000.  Ferritin was 5 (low) with an iron saturation of 3% (low) and a TIBC of 539 (high).  B12 was 452 and folate 35.0.  She was approved for IV iron.  During the interim, she notes no new symptoms.  She turned in her guaiac cards today.  She comments that she had her chest CT last month.   Past Medical History  Diagnosis Date  . COPD (chronic obstructive pulmonary disease) (HCC)   . Hypertension   . IDA (iron deficiency anemia)   . Centrilobular emphysema (HCC)   . Coronary artery disease involving native coronary artery of native heart without angina pectoris   . Lumbar radiculitis   . Essential hypertension   . Chronic systolic CHF (congestive heart failure) (HCC)   . VHD (valvular heart disease)   . Allergic rhinitis   . Colon polyposis   . Restless leg   . PUD (peptic ulcer disease)   . Rotator cuff rupture   . Melena   . Lumbar spondylitis (HCC)   . HLD (hyperlipidemia)    . Low serum vitamin D   . DDD (degenerative disc disease), lumbar   . Lumbar stenosis with neurogenic claudication     Past Surgical History  Procedure Laterality Date  . Colectomy    . Coronary angioplasty with stent placement      Family History  Problem Relation Age of Onset  . Heart disease Brother   . Heart disease Sister     Social History:  reports that she has quit smoking. She does not have any smokeless tobacco history on file. She reports that she does not drink alcohol or use illicit drugs.  She has a greater than 30 pack year smoking history.  She quit smoking in 1995.  The patient's son can not bring her to clinic on Tuesdays and Thursdays.  The patient is alone today.  Allergies:  Allergies  Allergen Reactions  . Ropinirole Anxiety and Other (See Comments)    Reaction:  Severe leg pain   . Aspirin Other (See Comments)    GIB  . Ferrous Fumarate Itching  . Ferrous Sulfate Diarrhea and Nausea And Vomiting  . Omeprazole Nausea And Vomiting  . Oxycodone Other (See Comments)    Reaction:  Makes pt hyper   . Prednisone Other (See Comments)    Reaction:  Makes pt hyper     Current Medications: Current Outpatient Prescriptions  Medication  Sig Dispense Refill  . acetaminophen (TYLENOL) 500 MG tablet Take 500 mg by mouth every 6 (six) hours as needed for mild pain, moderate pain, fever or headache.     . albuterol (PROVENTIL HFA;VENTOLIN HFA) 108 (90 Base) MCG/ACT inhaler Inhale 2 puffs into the lungs every 4 (four) hours as needed for wheezing or shortness of breath.     Marland Kitchen albuterol (PROVENTIL) (2.5 MG/3ML) 0.083% nebulizer solution Take 2.5 mg by nebulization every 6 (six) hours as needed for wheezing or shortness of breath.    . ALPRAZolam (XANAX) 0.25 MG tablet Take 0.25 mg by mouth at bedtime as needed for anxiety or sleep.    . budesonide-formoterol (SYMBICORT) 160-4.5 MCG/ACT inhaler Inhale 2 puffs into the lungs 2 (two) times daily.    . cyanocobalamin 1000  MCG tablet Take 1,000 mcg by mouth daily.    Marland Kitchen escitalopram (LEXAPRO) 10 MG tablet Take 10 mg by mouth daily.  0  . fluticasone (FLONASE) 50 MCG/ACT nasal spray Place 2 sprays into both nostrils daily.    . furosemide (LASIX) 20 MG tablet Take 20 mg by mouth daily.    Marland Kitchen gabapentin (NEURONTIN) 300 MG capsule Take 600 mg by mouth at bedtime.    Marland Kitchen lisinopril (PRINIVIL,ZESTRIL) 2.5 MG tablet Take 2.5 mg by mouth daily.    . ranitidine (ZANTAC) 150 MG tablet Take 150 mg by mouth 2 (two) times daily.    . simvastatin (ZOCOR) 20 MG tablet Take 20 mg by mouth at bedtime.    Marland Kitchen SPIRIVA RESPIMAT 2.5 MCG/ACT AERS Inhale 2 puffs into the lungs daily.   0  . traMADol (ULTRAM) 50 MG tablet Take 75 mg by mouth 2 (two) times daily as needed for moderate pain.     . Vitamin D, Ergocalciferol, (DRISDOL) 50000 units CAPS capsule Take 50,000 Units by mouth every 7 (seven) days. Pt takes on Saturday.     No current facility-administered medications for this visit.    Review of Systems:  GENERAL:  Feels "ok".  No fevers or sweats.  Weight loss of 3 pounds in 3 weeks.  Baseline weight 104-107 pounds. PERFORMANCE STATUS (ECOG):  1 HEENT:  No visual changes, runny nose, sore throat, mouth sores or tenderness. Lungs: COPD.  Shortness of breath with exertion.  No cough.  No hemoptysis. On oxygen 2 liters/min. Cardiac:  No chest pain, palpitations, orthopnea, or PND. GI:  Loose stools.  Appetite 75%.  No nausea, vomiting, diarrhea, constipation, melena or hematochezia. Hemorrhoids. GU:  No urgency, frequency, dysuria, or hematuria. Musculoskeletal:  Tendonitis.  Pain in hips down due to spinal stenosis.  No muscle tenderness. Extremities:  No pain or swelling. Skin:  No rashes or skin changes. Neuro:  Restless legs.  No headache, numbness or weakness, balance or coordination issues. Endocrine:  No diabetes, thyroid issues, hot flashes or night sweats. Psych:  No mood changes, depression or anxiety. Pain:  No focal  pain. Review of systems:  All other systems reviewed and found to be negative.  Physical Exam: Blood pressure 116/57, pulse 92, temperature 98.3 F (36.8 C), temperature source Oral, resp. rate 20, weight 103 lb 9.9 oz (47 kg), SpO2 97 %. GENERAL:  Thin elderly woman sitting comfortably in the exam room in no acute distress. MENTAL STATUS:  Alert and oriented to person, place and time. HEAD:  Short styled gray hair.  Normocephalic, atraumatic, face symmetric, no Cushingoid features. EYES:  Blue eyes.  No conjunctivitis or scleral icterus. RESPIRATORY:  Clear to auscultation  without rales, wheezes or rhonchi. CARDIOVASCULAR:  Regular rate and rhythm without murmur, rub or gallop. EXTREMITIES: No edema, no skin discoloration or tenderness.  No palpable cords. NEUROLOGICAL: Unremarkable. PSYCH:  Appropriate.  No visits with results within 3 Day(s) from this visit. Latest known visit with results is:  Appointment on 04/19/2015  Component Date Value Ref Range Status  . WBC 04/19/2015 5.5  3.6 - 11.0 K/uL Final  . RBC 04/19/2015 4.55  3.80 - 5.20 MIL/uL Final  . Hemoglobin 04/19/2015 8.7* 12.0 - 16.0 g/dL Final  . HCT 91/47/8295 28.3* 35.0 - 47.0 % Final  . MCV 04/19/2015 62.2* 80.0 - 100.0 fL Final  . MCH 04/19/2015 19.1* 26.0 - 34.0 pg Final  . MCHC 04/19/2015 30.7* 32.0 - 36.0 g/dL Final  . RDW 62/13/0865 19.8* 11.5 - 14.5 % Final  . Platelets 04/19/2015 240  150 - 440 K/uL Final  . Neutrophils Relative % 04/19/2015 53   Final  . Neutro Abs 04/19/2015 3.0  1.4 - 6.5 K/uL Final  . Lymphocytes Relative 04/19/2015 34   Final  . Lymphs Abs 04/19/2015 1.9  1.0 - 3.6 K/uL Final  . Monocytes Relative 04/19/2015 11   Final  . Monocytes Absolute 04/19/2015 0.6  0.2 - 0.9 K/uL Final  . Eosinophils Relative 04/19/2015 1   Final  . Eosinophils Absolute 04/19/2015 0.0  0 - 0.7 K/uL Final  . Basophils Relative 04/19/2015 1   Final  . Basophils Absolute 04/19/2015 0.0  0 - 0.1 K/uL Final  .  Ferritin 04/19/2015 5* 11 - 307 ng/mL Final  . Iron 04/19/2015 18* 28 - 170 ug/dL Final  . TIBC 78/46/9629 539* 250 - 450 ug/dL Final  . Saturation Ratios 04/19/2015 3* 10.4 - 31.8 % Final  . UIBC 04/19/2015 521   Final  . Vitamin B-12 04/19/2015 452  180 - 914 pg/mL Final   Comment: (NOTE) This assay is not validated for testing neonatal or myeloproliferative syndrome specimens for Vitamin B12 levels. Performed at [facility]   . Folate 04/19/2015 35.0  >5.9 ng/mL Final    Assessment:  BRITTIANY WIEHE is a 75 y.o. female with iron deficiency.  She has a history of bleeding gastric ulcer years ago and colonic polyps s/p partial colectomy in 2013.  She required 2 units of PRBCs in 2015.  Colonoscopy on 05/15/2013 revealed a patent ileocolonic anastomosis and internal hemorrhoids.  EGD was normal on 05/15/2013.  Guaiac cards were negative on 09/21/2014.  Diet is modest.  Oral iron causes diarrhea.  She has a history of B12 deficiency and is on oral B12. Ferritin was 10 (low) on 03/26/2015.  Work-up on 04/19/2015 revealed a hematocrit of 28.3, hemoglobin 8.7, MCV 62.2, platelets 240,000, WBC 5500 with an ANC of 3000.  Ferritin was 5 (low) with an iron saturation of 3% (low) and a TIBC of 539 (high).  B12 and folate were normal.  She has COPD and is on oxygen.  She has a greater than 30 pack year smoking history.  Chest CT angiogram on 04/01/2014 revealed no pulmonary embolism but a 4 mm left upper lobe pulmonary nodule.  Per her report, she had a chest CT in 03/2015 (no report available).  Symptomatically, she has a poor appetite.  She has lost 3 pounds in 2 weeks.  She has shortness of breath with exertion.  She has restless legs.  She has ice pica.  Plan: 1.  Review labs and plan for IV iron.  Side effects  reviewed. Patient consented to treatment.. 2.  Venofer today and weekly x 3 (04/24, 05/01, and 05/08). 3.  Ferritin on 05/17/2015. 4.  Follow-up guaiac cards turned in  today. 5.  Follow-up screening mammogram (overdue). 6.  Obtain copy of chest CT performed 03/2015. 7.  RTC on 05/24/2015 for MD assessment, labs (CBC with diff) and +/- Venofer.   Tanya Bath, MD  04/26/2015, 1:46 PM

## 2015-04-28 ENCOUNTER — Other Ambulatory Visit: Payer: Self-pay | Admitting: *Deleted

## 2015-04-28 ENCOUNTER — Ambulatory Visit: Payer: Self-pay | Admitting: *Deleted

## 2015-04-28 ENCOUNTER — Encounter: Payer: Self-pay | Admitting: Hematology and Oncology

## 2015-04-28 NOTE — Patient Outreach (Signed)
Attempt made to contact pt as part of ongoing transition of care.    HIPPA compliant voice message left with contact number.  If no response will try again.     Shayne Alkenose M.   Kraven Calk RN CCM [facility] Care Management  [phone]

## 2015-04-29 DIAGNOSIS — J441 Chronic obstructive pulmonary disease with (acute) exacerbation: Secondary | ICD-10-CM | POA: Diagnosis not present

## 2015-04-29 DIAGNOSIS — R0602 Shortness of breath: Secondary | ICD-10-CM | POA: Diagnosis not present

## 2015-04-29 DIAGNOSIS — I2510 Atherosclerotic heart disease of native coronary artery without angina pectoris: Secondary | ICD-10-CM | POA: Diagnosis not present

## 2015-04-29 DIAGNOSIS — I5022 Chronic systolic (congestive) heart failure: Secondary | ICD-10-CM | POA: Diagnosis not present

## 2015-04-29 DIAGNOSIS — I10 Essential (primary) hypertension: Secondary | ICD-10-CM | POA: Diagnosis not present

## 2015-05-02 ENCOUNTER — Other Ambulatory Visit: Payer: Self-pay | Admitting: Hematology and Oncology

## 2015-05-03 ENCOUNTER — Inpatient Hospital Stay: Payer: PPO

## 2015-05-03 VITALS — BP 111/68 | HR 74 | Resp 20

## 2015-05-03 DIAGNOSIS — Z1211 Encounter for screening for malignant neoplasm of colon: Secondary | ICD-10-CM | POA: Diagnosis not present

## 2015-05-03 DIAGNOSIS — D509 Iron deficiency anemia, unspecified: Secondary | ICD-10-CM | POA: Diagnosis not present

## 2015-05-03 MED ORDER — SODIUM CHLORIDE 0.9 % IV SOLN
200.0000 mg | Freq: Once | INTRAVENOUS | Status: AC
  Administered 2015-05-03: 200 mg via INTRAVENOUS
  Filled 2015-05-03: qty 10

## 2015-05-03 MED ORDER — SODIUM CHLORIDE 0.9 % IV SOLN
Freq: Once | INTRAVENOUS | Status: AC
  Administered 2015-05-03: 15:00:00 via INTRAVENOUS
  Filled 2015-05-03: qty 1000

## 2015-05-04 ENCOUNTER — Ambulatory Visit: Payer: PPO | Admitting: *Deleted

## 2015-05-05 ENCOUNTER — Ambulatory Visit: Payer: Self-pay | Admitting: *Deleted

## 2015-05-05 ENCOUNTER — Other Ambulatory Visit: Payer: Self-pay | Admitting: *Deleted

## 2015-05-05 NOTE — Patient Outreach (Signed)
Second attempt to f/u with pt for ongoing  transition of care.  Last telephone encounter with pt was 4/12.  HIPPA compliant voice message left with contact number.  If no response, will try again.    Shayne Alkenose M.   Emmylou Bieker RN CCM [facility] Care Management  [phone]

## 2015-05-07 DIAGNOSIS — J449 Chronic obstructive pulmonary disease, unspecified: Secondary | ICD-10-CM | POA: Diagnosis not present

## 2015-05-09 ENCOUNTER — Other Ambulatory Visit: Payer: Self-pay | Admitting: Hematology and Oncology

## 2015-05-10 ENCOUNTER — Inpatient Hospital Stay: Payer: PPO

## 2015-05-10 ENCOUNTER — Inpatient Hospital Stay: Payer: PPO | Attending: Hematology and Oncology

## 2015-05-10 DIAGNOSIS — J432 Centrilobular emphysema: Secondary | ICD-10-CM | POA: Insufficient documentation

## 2015-05-10 DIAGNOSIS — I2510 Atherosclerotic heart disease of native coronary artery without angina pectoris: Secondary | ICD-10-CM | POA: Insufficient documentation

## 2015-05-10 DIAGNOSIS — G2581 Restless legs syndrome: Secondary | ICD-10-CM | POA: Insufficient documentation

## 2015-05-10 DIAGNOSIS — E559 Vitamin D deficiency, unspecified: Secondary | ICD-10-CM | POA: Insufficient documentation

## 2015-05-10 DIAGNOSIS — M5136 Other intervertebral disc degeneration, lumbar region: Secondary | ICD-10-CM | POA: Insufficient documentation

## 2015-05-10 DIAGNOSIS — I5022 Chronic systolic (congestive) heart failure: Secondary | ICD-10-CM | POA: Insufficient documentation

## 2015-05-10 DIAGNOSIS — E538 Deficiency of other specified B group vitamins: Secondary | ICD-10-CM | POA: Insufficient documentation

## 2015-05-10 DIAGNOSIS — Z86010 Personal history of colonic polyps: Secondary | ICD-10-CM | POA: Insufficient documentation

## 2015-05-10 DIAGNOSIS — E785 Hyperlipidemia, unspecified: Secondary | ICD-10-CM | POA: Insufficient documentation

## 2015-05-10 DIAGNOSIS — Z8711 Personal history of peptic ulcer disease: Secondary | ICD-10-CM | POA: Insufficient documentation

## 2015-05-10 DIAGNOSIS — K921 Melena: Secondary | ICD-10-CM | POA: Insufficient documentation

## 2015-05-10 DIAGNOSIS — M5416 Radiculopathy, lumbar region: Secondary | ICD-10-CM | POA: Insufficient documentation

## 2015-05-10 DIAGNOSIS — D509 Iron deficiency anemia, unspecified: Secondary | ICD-10-CM | POA: Insufficient documentation

## 2015-05-10 DIAGNOSIS — J449 Chronic obstructive pulmonary disease, unspecified: Secondary | ICD-10-CM | POA: Insufficient documentation

## 2015-05-10 DIAGNOSIS — Z87891 Personal history of nicotine dependence: Secondary | ICD-10-CM | POA: Insufficient documentation

## 2015-05-10 DIAGNOSIS — I10 Essential (primary) hypertension: Secondary | ICD-10-CM | POA: Insufficient documentation

## 2015-05-11 ENCOUNTER — Other Ambulatory Visit: Payer: Self-pay | Admitting: *Deleted

## 2015-05-11 ENCOUNTER — Encounter: Payer: Self-pay | Admitting: *Deleted

## 2015-05-11 NOTE — Patient Outreach (Signed)
Final transition of care call.  Spoke with pt who reports doing better than a month ago on her breathing, on continuous O2.  Pt reports on f/u with a home visit, impossible as she goes with son to work (can keep an eye on her).  Pt reports had a panic attack > a month ago, got sob, 911 was called, reason why now she goes with son to work. RN CM discussed with pt use of purse lip breathing when anxious, pt aware of use.   Pt reports f/u saw Dr. Raul Del, was not given a f/ appointment.  Pt reports she is to see Dr. Saralyn Pilar 5/5 for stress test, f/u with Dr. Ginette Pitman in June.  Pt reports to f/u at Dr. Kem Parkinson office 5/8 for lab work, to inquire about iron infusion.   RN CM discussed with pt final transition of care call done today, plan to close case- no further case management needs to which pt agreed.   RN CM provided pt with [facility] main contact number to call if needs arise in the future.      Plan to fax  case closure letter to Dr. Ginette Pitman.   Plan to have case closure letter sent to pt.   Plan to inform [facility] care management assistant to close case, goals met.    Zara Chess.   [facility] Care Management  [phone]

## 2015-05-14 DIAGNOSIS — J441 Chronic obstructive pulmonary disease with (acute) exacerbation: Secondary | ICD-10-CM | POA: Diagnosis not present

## 2015-05-14 DIAGNOSIS — I5022 Chronic systolic (congestive) heart failure: Secondary | ICD-10-CM | POA: Diagnosis not present

## 2015-05-14 DIAGNOSIS — I2510 Atherosclerotic heart disease of native coronary artery without angina pectoris: Secondary | ICD-10-CM | POA: Diagnosis not present

## 2015-05-17 ENCOUNTER — Inpatient Hospital Stay: Payer: PPO

## 2015-05-17 ENCOUNTER — Other Ambulatory Visit: Payer: PPO

## 2015-05-17 VITALS — BP 130/72 | HR 73 | Temp 98.2°F

## 2015-05-17 DIAGNOSIS — J432 Centrilobular emphysema: Secondary | ICD-10-CM | POA: Diagnosis not present

## 2015-05-17 DIAGNOSIS — Z8711 Personal history of peptic ulcer disease: Secondary | ICD-10-CM | POA: Diagnosis not present

## 2015-05-17 DIAGNOSIS — J449 Chronic obstructive pulmonary disease, unspecified: Secondary | ICD-10-CM | POA: Diagnosis not present

## 2015-05-17 DIAGNOSIS — E559 Vitamin D deficiency, unspecified: Secondary | ICD-10-CM | POA: Diagnosis not present

## 2015-05-17 DIAGNOSIS — I5022 Chronic systolic (congestive) heart failure: Secondary | ICD-10-CM | POA: Diagnosis not present

## 2015-05-17 DIAGNOSIS — Z87891 Personal history of nicotine dependence: Secondary | ICD-10-CM | POA: Diagnosis not present

## 2015-05-17 DIAGNOSIS — G2581 Restless legs syndrome: Secondary | ICD-10-CM | POA: Diagnosis not present

## 2015-05-17 DIAGNOSIS — D509 Iron deficiency anemia, unspecified: Secondary | ICD-10-CM | POA: Diagnosis not present

## 2015-05-17 DIAGNOSIS — K921 Melena: Secondary | ICD-10-CM | POA: Diagnosis not present

## 2015-05-17 DIAGNOSIS — Z86010 Personal history of colonic polyps: Secondary | ICD-10-CM | POA: Diagnosis not present

## 2015-05-17 DIAGNOSIS — I10 Essential (primary) hypertension: Secondary | ICD-10-CM | POA: Diagnosis not present

## 2015-05-17 DIAGNOSIS — M5416 Radiculopathy, lumbar region: Secondary | ICD-10-CM | POA: Diagnosis not present

## 2015-05-17 DIAGNOSIS — E538 Deficiency of other specified B group vitamins: Secondary | ICD-10-CM | POA: Diagnosis not present

## 2015-05-17 DIAGNOSIS — E785 Hyperlipidemia, unspecified: Secondary | ICD-10-CM | POA: Diagnosis not present

## 2015-05-17 DIAGNOSIS — I2510 Atherosclerotic heart disease of native coronary artery without angina pectoris: Secondary | ICD-10-CM | POA: Diagnosis not present

## 2015-05-17 DIAGNOSIS — M5136 Other intervertebral disc degeneration, lumbar region: Secondary | ICD-10-CM | POA: Diagnosis not present

## 2015-05-17 DIAGNOSIS — D649 Anemia, unspecified: Secondary | ICD-10-CM

## 2015-05-17 LAB — CBC WITH DIFFERENTIAL/PLATELET
Basophils Absolute: 0 10*3/uL (ref 0–0.1)
Basophils Relative: 1 %
Eosinophils Absolute: 0 10*3/uL (ref 0–0.7)
Eosinophils Relative: 0 %
HCT: 30.2 % — ABNORMAL LOW (ref 35.0–47.0)
Hemoglobin: 9.6 g/dL — ABNORMAL LOW (ref 12.0–16.0)
Lymphocytes Relative: 31 %
Lymphs Abs: 1.6 10*3/uL (ref 1.0–3.6)
MCH: 21.6 pg — ABNORMAL LOW (ref 26.0–34.0)
MCHC: 31.7 g/dL — ABNORMAL LOW (ref 32.0–36.0)
MCV: 68.2 fL — ABNORMAL LOW (ref 80.0–100.0)
Monocytes Absolute: 0.4 10*3/uL (ref 0.2–0.9)
Monocytes Relative: 8 %
Neutro Abs: 3.1 10*3/uL (ref 1.4–6.5)
Neutrophils Relative %: 60 %
Platelets: 224 10*3/uL (ref 150–440)
RBC: 4.43 MIL/uL (ref 3.80–5.20)
RDW: 27.8 % — ABNORMAL HIGH (ref 11.5–14.5)
WBC: 5.1 10*3/uL (ref 3.6–11.0)

## 2015-05-17 MED ORDER — SODIUM CHLORIDE 0.9 % IV SOLN
200.0000 mg | Freq: Once | INTRAVENOUS | Status: AC
  Administered 2015-05-17: 200 mg via INTRAVENOUS
  Filled 2015-05-17: qty 10

## 2015-05-17 MED ORDER — SODIUM CHLORIDE 0.9 % IV SOLN
Freq: Once | INTRAVENOUS | Status: AC
  Administered 2015-05-17: 15:00:00 via INTRAVENOUS
  Filled 2015-05-17: qty 1000

## 2015-05-19 DIAGNOSIS — I2510 Atherosclerotic heart disease of native coronary artery without angina pectoris: Secondary | ICD-10-CM | POA: Diagnosis not present

## 2015-05-19 DIAGNOSIS — I5022 Chronic systolic (congestive) heart failure: Secondary | ICD-10-CM | POA: Diagnosis not present

## 2015-05-19 DIAGNOSIS — J441 Chronic obstructive pulmonary disease with (acute) exacerbation: Secondary | ICD-10-CM | POA: Diagnosis not present

## 2015-05-19 DIAGNOSIS — I10 Essential (primary) hypertension: Secondary | ICD-10-CM | POA: Diagnosis not present

## 2015-05-19 DIAGNOSIS — E7800 Pure hypercholesterolemia, unspecified: Secondary | ICD-10-CM | POA: Diagnosis not present

## 2015-05-19 DIAGNOSIS — K279 Peptic ulcer, site unspecified, unspecified as acute or chronic, without hemorrhage or perforation: Secondary | ICD-10-CM | POA: Diagnosis not present

## 2015-05-19 DIAGNOSIS — R0600 Dyspnea, unspecified: Secondary | ICD-10-CM | POA: Diagnosis not present

## 2015-05-21 ENCOUNTER — Other Ambulatory Visit: Payer: Self-pay

## 2015-05-21 DIAGNOSIS — D649 Anemia, unspecified: Secondary | ICD-10-CM

## 2015-05-23 ENCOUNTER — Other Ambulatory Visit: Payer: Self-pay | Admitting: Hematology and Oncology

## 2015-05-24 ENCOUNTER — Telehealth: Payer: Self-pay

## 2015-05-24 ENCOUNTER — Inpatient Hospital Stay: Payer: PPO

## 2015-05-24 ENCOUNTER — Inpatient Hospital Stay (HOSPITAL_BASED_OUTPATIENT_CLINIC_OR_DEPARTMENT_OTHER): Payer: PPO | Admitting: Hematology and Oncology

## 2015-05-24 VITALS — BP 109/48 | HR 66 | Temp 95.6°F | Resp 18 | Ht 60.0 in | Wt 102.1 lb

## 2015-05-24 DIAGNOSIS — G2581 Restless legs syndrome: Secondary | ICD-10-CM

## 2015-05-24 DIAGNOSIS — E785 Hyperlipidemia, unspecified: Secondary | ICD-10-CM

## 2015-05-24 DIAGNOSIS — E559 Vitamin D deficiency, unspecified: Secondary | ICD-10-CM | POA: Diagnosis not present

## 2015-05-24 DIAGNOSIS — Z87891 Personal history of nicotine dependence: Secondary | ICD-10-CM

## 2015-05-24 DIAGNOSIS — I5022 Chronic systolic (congestive) heart failure: Secondary | ICD-10-CM

## 2015-05-24 DIAGNOSIS — Z86010 Personal history of colonic polyps: Secondary | ICD-10-CM

## 2015-05-24 DIAGNOSIS — J449 Chronic obstructive pulmonary disease, unspecified: Secondary | ICD-10-CM

## 2015-05-24 DIAGNOSIS — Z8711 Personal history of peptic ulcer disease: Secondary | ICD-10-CM

## 2015-05-24 DIAGNOSIS — E538 Deficiency of other specified B group vitamins: Secondary | ICD-10-CM | POA: Diagnosis not present

## 2015-05-24 DIAGNOSIS — D509 Iron deficiency anemia, unspecified: Secondary | ICD-10-CM | POA: Diagnosis not present

## 2015-05-24 DIAGNOSIS — J432 Centrilobular emphysema: Secondary | ICD-10-CM

## 2015-05-24 DIAGNOSIS — M5136 Other intervertebral disc degeneration, lumbar region: Secondary | ICD-10-CM

## 2015-05-24 DIAGNOSIS — M5416 Radiculopathy, lumbar region: Secondary | ICD-10-CM

## 2015-05-24 DIAGNOSIS — I10 Essential (primary) hypertension: Secondary | ICD-10-CM

## 2015-05-24 DIAGNOSIS — I2510 Atherosclerotic heart disease of native coronary artery without angina pectoris: Secondary | ICD-10-CM

## 2015-05-24 DIAGNOSIS — D649 Anemia, unspecified: Secondary | ICD-10-CM

## 2015-05-24 DIAGNOSIS — K921 Melena: Secondary | ICD-10-CM

## 2015-05-24 LAB — CBC WITH DIFFERENTIAL/PLATELET
Basophils Absolute: 0 10*3/uL (ref 0–0.1)
Basophils Relative: 1 %
Eosinophils Absolute: 0 10*3/uL (ref 0–0.7)
Eosinophils Relative: 1 %
HCT: 31.1 % — ABNORMAL LOW (ref 35.0–47.0)
Hemoglobin: 9.9 g/dL — ABNORMAL LOW (ref 12.0–16.0)
Lymphocytes Relative: 45 %
Lymphs Abs: 2.2 10*3/uL (ref 1.0–3.6)
MCH: 22.5 pg — ABNORMAL LOW (ref 26.0–34.0)
MCHC: 31.8 g/dL — ABNORMAL LOW (ref 32.0–36.0)
MCV: 70.8 fL — ABNORMAL LOW (ref 80.0–100.0)
Monocytes Absolute: 0.5 10*3/uL (ref 0.2–0.9)
Monocytes Relative: 9 %
Neutro Abs: 2.1 10*3/uL (ref 1.4–6.5)
Neutrophils Relative %: 44 %
Platelets: 185 10*3/uL (ref 150–440)
RBC: 4.4 MIL/uL (ref 3.80–5.20)
RDW: 29.3 % — ABNORMAL HIGH (ref 11.5–14.5)
WBC: 4.9 10*3/uL (ref 3.6–11.0)

## 2015-05-24 LAB — FERRITIN: Ferritin: 92 ng/mL (ref 11–307)

## 2015-05-24 NOTE — Telephone Encounter (Signed)
Called pt per MD pt does need IV iron next week.  Pt verbalized an understanding no other concerns

## 2015-05-24 NOTE — Progress Notes (Signed)
No changes since last visit.  Pt reports tendinitis in left arm.  No other concerns

## 2015-05-24 NOTE — Progress Notes (Signed)
[name]-  Cancer Center  Clinic day:  05/24/2015  Chief Complaint: Chesley NoonCarolyn F Moss is a 75 y.o. female with a history of iron deficiency anemia who is seen for 1 month assessment.  HPI:  The patient was last seen in the medical oncology clinic on 04/26/2015.  At that time, work-up was reviewed.  She had a microcytic anemia consistent with iron deficiency.  Hematocrit was 28.3, hemoglobin 8.7, and MCV 62.2.  Ferritin was 5 (low) with an iron saturation of 3% (low) and a TIBC of 539 (high).  She received Venofer 200 mg on 04/26/2015, 05/03/2015, and 05/17/2015.  CBC on 05/17/2015 revealed a hematocrit of 30.2, hemoglobin 9.6, and MCV 68.2.  Symptomatically, she feels better. She is able to clean her house. She denies any ice cravings.   She notes that her stress test was negative.  Guaiac cards x 2 were negative on 05/03/2015.   Past Medical History  Diagnosis Date  . COPD (chronic obstructive pulmonary disease) (HCC)   . Hypertension   . IDA (iron deficiency anemia)   . Centrilobular emphysema (HCC)   . Coronary artery disease involving native coronary artery of native heart without angina pectoris   . Lumbar radiculitis   . Essential hypertension   . Chronic systolic CHF (congestive heart failure) (HCC)   . VHD (valvular heart disease)   . Allergic rhinitis   . Colon polyposis   . Restless leg   . PUD (peptic ulcer disease)   . Rotator cuff rupture   . Melena   . Lumbar spondylitis (HCC)   . HLD (hyperlipidemia)   . Low serum vitamin D   . DDD (degenerative disc disease), lumbar   . Lumbar stenosis with neurogenic claudication     Past Surgical History  Procedure Laterality Date  . Colectomy    . Coronary angioplasty with stent placement      Family History  Problem Relation Age of Onset  . Heart disease Brother   . Heart disease Sister     Social History:  reports that she has quit smoking. She does not have any smokeless tobacco history  on file. She reports that she does not drink alcohol or use illicit drugs.  She has a greater than 30 pack year smoking history.  She quit smoking in 1995.  The patient's son can not bring her to clinic on Tuesdays and Thursdays.  The patient is alone today.  Allergies:  Allergies  Allergen Reactions  . Ropinirole Anxiety and Other (See Comments)    Reaction:  Severe leg pain   . Aspirin Other (See Comments)    GIB  . Ferrous Fumarate Itching  . Ferrous Sulfate Diarrhea and Nausea And Vomiting  . Omeprazole Nausea And Vomiting  . Oxycodone Other (See Comments)    Reaction:  Makes pt hyper   . Prednisone Other (See Comments)    Reaction:  Makes pt hyper     Current Medications: Current Outpatient Prescriptions  Medication Sig Dispense Refill  . acetaminophen (TYLENOL) 500 MG tablet Take 500 mg by mouth every 6 (six) hours as needed for mild pain, moderate pain, fever or headache.     . albuterol (PROVENTIL HFA;VENTOLIN HFA) 108 (90 Base) MCG/ACT inhaler Inhale 2 puffs into the lungs every 4 (four) hours as needed for wheezing or shortness of breath.     Marland Kitchen. albuterol (PROVENTIL) (2.5 MG/3ML) 0.083% nebulizer solution Take 2.5 mg by nebulization every 6 (six) hours as needed for wheezing  or shortness of breath.    . ALPRAZolam (XANAX) 0.25 MG tablet Take 0.25 mg by mouth at bedtime as needed for anxiety or sleep.    . budesonide-formoterol (SYMBICORT) 160-4.5 MCG/ACT inhaler Inhale 2 puffs into the lungs 2 (two) times daily.    . cyanocobalamin 1000 MCG tablet Take 1,000 mcg by mouth daily.    Marland Kitchen escitalopram (LEXAPRO) 10 MG tablet Take 10 mg by mouth daily.  0  . fluticasone (FLONASE) 50 MCG/ACT nasal spray Place 2 sprays into both nostrils daily.    . furosemide (LASIX) 20 MG tablet Take 20 mg by mouth daily.    Marland Kitchen gabapentin (NEURONTIN) 300 MG capsule Take 600 mg by mouth at bedtime.    Marland Kitchen lisinopril (PRINIVIL,ZESTRIL) 2.5 MG tablet Take 2.5 mg by mouth daily.    . metoprolol succinate  (TOPROL-XL) 25 MG 24 hr tablet Take 25 mg by mouth daily.  1  . ranitidine (ZANTAC) 150 MG tablet Take 150 mg by mouth 2 (two) times daily.    . simvastatin (ZOCOR) 20 MG tablet Take 20 mg by mouth at bedtime.    Marland Kitchen SPIRIVA RESPIMAT 2.5 MCG/ACT AERS Inhale 2 puffs into the lungs daily.   0  . traMADol (ULTRAM) 50 MG tablet Take 75 mg by mouth 2 (two) times daily as needed for moderate pain.     . Vitamin D, Ergocalciferol, (DRISDOL) 50000 units CAPS capsule Take 50,000 Units by mouth every 7 (seven) days. Pt takes on Saturday.     No current facility-administered medications for this visit.    Review of Systems:  GENERAL:  Feels "ok".  No fevers or sweats.  Weight loss of 1 pound.  Baseline weight 104-107 pounds. PERFORMANCE STATUS (ECOG):  1 HEENT:  No visual changes, runny nose, sore throat, mouth sores or tenderness. Lungs: COPD.  Shortness of breath with exertion.  No cough.  No hemoptysis. On oxygen 2 liters/min. Cardiac:  No chest pain, palpitations, orthopnea, or PND. GI:  Appetite 100%.  No nausea, vomiting, diarrhea, constipation, melena or hematochezia. Hemorrhoids. GU:  No urgency, frequency, dysuria, or hematuria. Musculoskeletal:  Tendonitis in left arm.  Pain in hips down due to spinal stenosis.  No muscle tenderness. Extremities:  No pain or swelling. Skin:  No rashes or skin changes. Neuro:  Restless legs.  No headache, numbness or weakness, balance or coordination issues. Endocrine:  No diabetes, thyroid issues, hot flashes or night sweats. Psych:  No mood changes, depression or anxiety. Pain:  No focal pain. Review of systems:  All other systems reviewed and found to be negative.  Physical Exam: Blood pressure 109/48, pulse 66, temperature 95.6 F (35.3 C), temperature source Oral, resp. rate 18, height 5' (1.524 m), weight 102 lb 1.2 oz (46.3 kg). GENERAL:  Thin elderly woman sitting comfortably in the exam room in no acute distress. MENTAL STATUS:  Alert and oriented  to person, place and time. HEAD:  Short styled gray hair.  Normocephalic, atraumatic, face symmetric, no Cushingoid features. EYES:  Blue eyes.  Pupils equal round and reactive to light and accomodation.  No conjunctivitis or scleral icterus. ENT:  Oropharynx clear without lesion.  Tongue normal. Mucous membranes moist.  RESPIRATORY:  Clear to auscultation without rales, wheezes or rhonchi. CARDIOVASCULAR:  Regular rate and rhythm without murmur, rub or gallop. ABDOMEN:  Soft, non-tender, with active bowel sounds, and no hepatosplenomegaly.  No masses. SKIN:  No rashes, ulcers or lesions. EXTREMITIES: No edema, no skin discoloration or tenderness.  No  palpable cords. LYMPH NODES: No palpable cervical, supraclavicular, axillary or inguinal adenopathy  NEUROLOGICAL: Unremarkable. PSYCH:  Appropriate.    Appointment on 05/24/2015  Component Date Value Ref Range Status  . WBC 05/24/2015 4.9  3.6 - 11.0 K/uL Final  . RBC 05/24/2015 4.40  3.80 - 5.20 MIL/uL Final  . Hemoglobin 05/24/2015 9.9* 12.0 - 16.0 g/dL Final  . HCT 16/10/9602 31.1* 35.0 - 47.0 % Final  . MCV 05/24/2015 70.8* 80.0 - 100.0 fL Final  . MCH 05/24/2015 22.5* 26.0 - 34.0 pg Final  . MCHC 05/24/2015 31.8* 32.0 - 36.0 g/dL Final  . RDW 54/09/8117 29.3* 11.5 - 14.5 % Final  . Platelets 05/24/2015 185  150 - 440 K/uL Final  . Neutrophils Relative % 05/24/2015 44   Final  . Neutro Abs 05/24/2015 2.1  1.4 - 6.5 K/uL Final  . Lymphocytes Relative 05/24/2015 45   Final  . Lymphs Abs 05/24/2015 2.2  1.0 - 3.6 K/uL Final  . Monocytes Relative 05/24/2015 9   Final  . Monocytes Absolute 05/24/2015 0.5  0.2 - 0.9 K/uL Final  . Eosinophils Relative 05/24/2015 1   Final  . Eosinophils Absolute 05/24/2015 0.0  0 - 0.7 K/uL Final  . Basophils Relative 05/24/2015 1   Final  . Basophils Absolute 05/24/2015 0.0  0 - 0.1 K/uL Final    Assessment:  MONSERRATT KNEZEVIC is a 74 y.o. female with iron deficiency.  She has a history of bleeding  gastric ulcer years ago and colonic polyps s/p partial colectomy in 2013.  She required 2 units of PRBCs in 2015.  Colonoscopy on 05/15/2013 revealed a patent ileocolonic anastomosis and internal hemorrhoids.  EGD was normal on 05/15/2013.  Guaiac cards were negative on 09/21/2014 and 05/03/2015.  Diet is modest.  Oral iron causes diarrhea.  She has a history of B12 deficiency and is on oral B12. Ferritin was 10 (low) on 03/26/2015.  She received 600 mg Venofer from 04/26/2015 - 05/17/2015.  Work-up on 04/19/2015 revealed a hematocrit of 28.3, hemoglobin 8.7, MCV 62.2, platelets 240,000, WBC 5500 with an ANC of 3000.  Ferritin was 5 (low) with an iron saturation of 3% (low) and a TIBC of 539 (high).  B12 and folate were normal.  She has COPD and is on oxygen.  She has a greater than 30 pack year smoking history.  Chest CT angiogram on 04/01/2014 revealed no pulmonary embolism but a 4 mm left upper lobe pulmonary nodule.  Chest CT on 03/05/2015 revealed a stable 3 mm lingular nodule.  There were new ground-glass opacities in the right upper lobe, right middle lobe and right lower lobe suggestive of an infectious or inflammatory process. Recommendation was for follow-up CT in 3-6 months.  Symptomatically, she feels better.  Ice pica has resolved.  Exam is stable.  Hematocrit has improved to 31.1.  Ferritin is 92.  Plan: 1.  Labs:  CBC with diff, ferritin. 2.  Review improvement in hematocrit. 3.  Call patient with ferritin.  Ferritin goal 100. 4.  Anticipate follow-up chest CT by 09/02/2015. 5.  RTC in 3 months for MD assessment, labs (CBC with diff + ferritin week before appt) and +/- Venofer.   Rosey Bath, MD  05/24/2015, 2:01 PM

## 2015-05-30 ENCOUNTER — Encounter: Payer: Self-pay | Admitting: Hematology and Oncology

## 2015-05-31 ENCOUNTER — Ambulatory Visit: Payer: PPO

## 2015-06-06 DIAGNOSIS — J449 Chronic obstructive pulmonary disease, unspecified: Secondary | ICD-10-CM | POA: Diagnosis not present

## 2015-07-02 DIAGNOSIS — M5416 Radiculopathy, lumbar region: Secondary | ICD-10-CM | POA: Diagnosis not present

## 2015-07-02 DIAGNOSIS — I5022 Chronic systolic (congestive) heart failure: Secondary | ICD-10-CM | POA: Diagnosis not present

## 2015-07-02 DIAGNOSIS — D500 Iron deficiency anemia secondary to blood loss (chronic): Secondary | ICD-10-CM | POA: Diagnosis not present

## 2015-07-02 DIAGNOSIS — J432 Centrilobular emphysema: Secondary | ICD-10-CM | POA: Diagnosis not present

## 2015-07-02 DIAGNOSIS — I38 Endocarditis, valve unspecified: Secondary | ICD-10-CM | POA: Diagnosis not present

## 2015-07-02 DIAGNOSIS — I10 Essential (primary) hypertension: Secondary | ICD-10-CM | POA: Diagnosis not present

## 2015-07-02 DIAGNOSIS — Z09 Encounter for follow-up examination after completed treatment for conditions other than malignant neoplasm: Secondary | ICD-10-CM | POA: Diagnosis not present

## 2015-07-02 DIAGNOSIS — I2510 Atherosclerotic heart disease of native coronary artery without angina pectoris: Secondary | ICD-10-CM | POA: Diagnosis not present

## 2015-07-07 DIAGNOSIS — J449 Chronic obstructive pulmonary disease, unspecified: Secondary | ICD-10-CM | POA: Diagnosis not present

## 2015-07-09 ENCOUNTER — Other Ambulatory Visit: Payer: Self-pay | Admitting: Internal Medicine

## 2015-07-09 DIAGNOSIS — R918 Other nonspecific abnormal finding of lung field: Secondary | ICD-10-CM

## 2015-07-09 DIAGNOSIS — I10 Essential (primary) hypertension: Secondary | ICD-10-CM | POA: Diagnosis not present

## 2015-07-09 DIAGNOSIS — M5136 Other intervertebral disc degeneration, lumbar region: Secondary | ICD-10-CM | POA: Diagnosis not present

## 2015-07-09 DIAGNOSIS — Z1231 Encounter for screening mammogram for malignant neoplasm of breast: Secondary | ICD-10-CM

## 2015-07-09 DIAGNOSIS — I5022 Chronic systolic (congestive) heart failure: Secondary | ICD-10-CM | POA: Diagnosis not present

## 2015-07-09 DIAGNOSIS — J9611 Chronic respiratory failure with hypoxia: Secondary | ICD-10-CM | POA: Diagnosis not present

## 2015-07-09 DIAGNOSIS — M25512 Pain in left shoulder: Secondary | ICD-10-CM | POA: Diagnosis not present

## 2015-07-09 DIAGNOSIS — Z1239 Encounter for other screening for malignant neoplasm of breast: Secondary | ICD-10-CM | POA: Diagnosis not present

## 2015-07-09 DIAGNOSIS — D508 Other iron deficiency anemias: Secondary | ICD-10-CM | POA: Diagnosis not present

## 2015-07-09 DIAGNOSIS — J432 Centrilobular emphysema: Secondary | ICD-10-CM | POA: Diagnosis not present

## 2015-07-30 ENCOUNTER — Other Ambulatory Visit: Payer: Self-pay | Admitting: Internal Medicine

## 2015-07-30 ENCOUNTER — Ambulatory Visit
Admission: RE | Admit: 2015-07-30 | Discharge: 2015-07-30 | Disposition: A | Discharge status: Home / Self Care | Payer: PPO | Source: Ambulatory Visit | Attending: Internal Medicine | Admitting: Internal Medicine

## 2015-07-30 DIAGNOSIS — R918 Other nonspecific abnormal finding of lung field: Secondary | ICD-10-CM | POA: Insufficient documentation

## 2015-07-30 DIAGNOSIS — Z1231 Encounter for screening mammogram for malignant neoplasm of breast: Secondary | ICD-10-CM

## 2015-07-30 DIAGNOSIS — I700 Atherosclerosis of aorta: Secondary | ICD-10-CM | POA: Diagnosis not present

## 2015-07-30 DIAGNOSIS — K8020 Calculus of gallbladder without cholecystitis without obstruction: Secondary | ICD-10-CM | POA: Diagnosis not present

## 2015-07-30 LAB — POCT I-STAT CREATININE: Creatinine, Ser: 0.9 mg/dL (ref 0.44–1.00)

## 2015-07-30 MED ORDER — IOPAMIDOL (ISOVUE-300) INJECTION 61%
75.0000 mL | Freq: Once | INTRAVENOUS | Status: AC | PRN
  Administered 2015-07-30: 75 mL via INTRAVENOUS

## 2015-08-05 ENCOUNTER — Encounter: Payer: Self-pay | Admitting: Emergency Medicine

## 2015-08-05 ENCOUNTER — Emergency Department
Admission: EM | Admit: 2015-08-05 | Discharge: 2015-08-05 | Disposition: A | Discharge status: Home / Self Care | Payer: PPO | Attending: Emergency Medicine | Admitting: Emergency Medicine

## 2015-08-05 ENCOUNTER — Emergency Department: Payer: PPO

## 2015-08-05 DIAGNOSIS — Z955 Presence of coronary angioplasty implant and graft: Secondary | ICD-10-CM | POA: Diagnosis not present

## 2015-08-05 DIAGNOSIS — Z87891 Personal history of nicotine dependence: Secondary | ICD-10-CM | POA: Diagnosis not present

## 2015-08-05 DIAGNOSIS — I2510 Atherosclerotic heart disease of native coronary artery without angina pectoris: Secondary | ICD-10-CM | POA: Insufficient documentation

## 2015-08-05 DIAGNOSIS — I110 Hypertensive heart disease with heart failure: Secondary | ICD-10-CM | POA: Diagnosis not present

## 2015-08-05 DIAGNOSIS — J441 Chronic obstructive pulmonary disease with (acute) exacerbation: Secondary | ICD-10-CM | POA: Diagnosis not present

## 2015-08-05 DIAGNOSIS — Z79899 Other long term (current) drug therapy: Secondary | ICD-10-CM | POA: Insufficient documentation

## 2015-08-05 DIAGNOSIS — I5022 Chronic systolic (congestive) heart failure: Secondary | ICD-10-CM | POA: Diagnosis not present

## 2015-08-05 DIAGNOSIS — R0602 Shortness of breath: Secondary | ICD-10-CM | POA: Diagnosis not present

## 2015-08-05 DIAGNOSIS — Z7951 Long term (current) use of inhaled steroids: Secondary | ICD-10-CM | POA: Insufficient documentation

## 2015-08-05 LAB — CBC
HEMATOCRIT: 33.4 % — AB (ref 35.0–47.0)
HEMOGLOBIN: 11.1 g/dL — AB (ref 12.0–16.0)
MCH: 27.4 pg (ref 26.0–34.0)
MCHC: 33.4 g/dL (ref 32.0–36.0)
MCV: 82 fL (ref 80.0–100.0)
Platelets: 264 10*3/uL (ref 150–440)
RBC: 4.07 MIL/uL (ref 3.80–5.20)
RDW: 15.5 % — ABNORMAL HIGH (ref 11.5–14.5)
WBC: 4.3 10*3/uL (ref 3.6–11.0)

## 2015-08-05 LAB — TROPONIN I: Troponin I: <0.03 ng/mL (ref <0.03)

## 2015-08-05 LAB — BASIC METABOLIC PANEL
ANION GAP: 10 (ref 5–15)
BUN: 16 mg/dL (ref 6–20)
CALCIUM: 9.3 mg/dL (ref 8.9–10.3)
CHLORIDE: 98 mmol/L — AB (ref 101–111)
CO2: 28 mmol/L (ref 22–32)
Creatinine, Ser: 1.2 mg/dL — ABNORMAL HIGH (ref 0.44–1.00)
GFR calc non Af Amer: 43 mL/min — ABNORMAL LOW (ref >60)
GFR, EST AFRICAN AMERICAN: 50 mL/min — AB (ref >60)
GLUCOSE: 128 mg/dL — AB (ref 65–99)
Potassium: 3.4 mmol/L — ABNORMAL LOW (ref 3.5–5.1)
Sodium: 136 mmol/L (ref 135–145)

## 2015-08-05 MED ORDER — IPRATROPIUM-ALBUTEROL 0.5-2.5 (3) MG/3ML IN SOLN
3.0000 mL | Freq: Once | RESPIRATORY_TRACT | Status: AC
  Administered 2015-08-05: 3 mL via RESPIRATORY_TRACT
  Filled 2015-08-05: qty 3

## 2015-08-05 MED ORDER — PREDNISONE 20 MG PO TABS: 40.0000 mg | ORAL_TABLET | Freq: Every day | ORAL | 0 refills | Status: DC

## 2015-08-05 MED ORDER — METHYLPREDNISOLONE SODIUM SUCC 125 MG IJ SOLR
125.0000 mg | Freq: Once | INTRAMUSCULAR | Status: AC
  Administered 2015-08-05: 125 mg via INTRAVENOUS
  Filled 2015-08-05: qty 2

## 2015-08-05 NOTE — ED Notes (Signed)
Pt to BR via w/c, no distress noted, no assist required; taken to xray by radiology

## 2015-08-05 NOTE — ED Triage Notes (Signed)
Pt presents to ED with reports of increasing shortness of breath throughout the day. Pt is on 2L via [place] at all times. Pt reports using inhaler and nebulizer today in an attempt to alleviate symptoms but did not get any relief.

## 2015-08-05 NOTE — ED Provider Notes (Signed)
[facility] Emergency Department Provider Note  Time seen: 8:26 PM  I have reviewed the triage vital signs and the nursing notes.   HISTORY  Chief Complaint Shortness of Breath    HPI Tanya Rhodes is a 75 y.o. female with a past medical history CHF, COPD, hypertension, presents to the emergency department shortness of breath. According to the patient beginning approximately 2 hours ago 6 PM she developed shortness of breath. Denies any chest pain. States it feels like her COPD acting up. Took albuterol at home without relief so she came to the emergency department. Patient wears 2 L oxygen via nasal cannula 24/7 at baseline. Currently satting 100% on 2 L. Patient states mild cough, but states this is fairly chronic for her denies any sputum production or fever. Denies any abdominal pain, nausea, vomiting.  Past Medical History:  Diagnosis Date  . Allergic rhinitis   . Centrilobular emphysema (HCC)   . Chronic systolic CHF (congestive heart failure) (HCC)   . Colon polyposis   . COPD (chronic obstructive pulmonary disease) (HCC)   . Coronary artery disease involving native coronary artery of native heart without angina pectoris   . DDD (degenerative disc disease), lumbar   . Essential hypertension   . HLD (hyperlipidemia)   . Hypertension   . IDA (iron deficiency anemia)   . Low serum vitamin D   . Lumbar radiculitis   . Lumbar spondylitis (HCC)   . Lumbar stenosis with neurogenic claudication   . Melena   . PUD (peptic ulcer disease)   . Restless leg   . Rotator cuff rupture   . VHD (valvular heart disease)     Patient Active Problem List   Diagnosis Date Noted  . Anemia 04/19/2015  . Iron deficiency anemia 04/19/2015  . Dyspnea 03/30/2015  . Acute on chronic respiratory failure (HCC) 03/30/2015  . COPD exacerbation (HCC) 02/18/2015  . B12 deficiency 02/09/2014    Past Surgical History:  Procedure Laterality Date  . BREAST BIOPSY Right  11/20/11   neg [place] core  . COLECTOMY    . CORONARY ANGIOPLASTY WITH STENT PLACEMENT      Prior to Admission medications   Medication Sig Start Date End Date Taking? Authorizing Provider  acetaminophen (TYLENOL) 500 MG tablet Take 500 mg by mouth every 6 (six) hours as needed for mild pain, moderate pain, fever or headache.     Historical Provider, MD  albuterol (PROVENTIL HFA;VENTOLIN HFA) 108 (90 Base) MCG/ACT inhaler Inhale 2 puffs into the lungs every 4 (four) hours as needed for wheezing or shortness of breath.     Historical Provider, MD  albuterol (PROVENTIL) (2.5 MG/3ML) 0.083% nebulizer solution Take 2.5 mg by nebulization every 6 (six) hours as needed for wheezing or shortness of breath.    Historical Provider, MD  ALPRAZolam Prudy Feeler) 0.25 MG tablet Take 0.25 mg by mouth at bedtime as needed for anxiety or sleep.    Historical Provider, MD  budesonide-formoterol (SYMBICORT) 160-4.5 MCG/ACT inhaler Inhale 2 puffs into the lungs 2 (two) times daily.    Historical Provider, MD  cyanocobalamin 1000 MCG tablet Take 1,000 mcg by mouth daily.    Historical Provider, MD  escitalopram (LEXAPRO) 10 MG tablet Take 10 mg by mouth daily. 03/22/15   Historical Provider, MD  fluticasone (FLONASE) 50 MCG/ACT nasal spray Place 2 sprays into both nostrils daily.    Historical Provider, MD  furosemide (LASIX) 20 MG tablet Take 20 mg by mouth daily.  Historical Provider, MD  gabapentin (NEURONTIN) 300 MG capsule Take 600 mg by mouth at bedtime.    Historical Provider, MD  lisinopril (PRINIVIL,ZESTRIL) 2.5 MG tablet Take 2.5 mg by mouth daily.    Historical Provider, MD  metoprolol succinate (TOPROL-XL) 25 MG 24 hr tablet Take 25 mg by mouth daily. 04/29/15   Historical Provider, MD  ranitidine (ZANTAC) 150 MG tablet Take 150 mg by mouth 2 (two) times daily.    Historical Provider, MD  simvastatin (ZOCOR) 20 MG tablet Take 20 mg by mouth at bedtime.    Historical Provider, MD  SPIRIVA RESPIMAT 2.5 MCG/ACT  AERS Inhale 2 puffs into the lungs daily.  03/02/15   Historical Provider, MD  traMADol (ULTRAM) 50 MG tablet Take 75 mg by mouth 2 (two) times daily as needed for moderate pain.     Historical Provider, MD  Vitamin D, Ergocalciferol, (DRISDOL) 50000 units CAPS capsule Take 50,000 Units by mouth every 7 (seven) days. Pt takes on Saturday.    Historical Provider, MD    Allergies  Allergen Reactions  . Ropinirole Anxiety and Other (See Comments)    Reaction:  Severe leg pain   . Aspirin Other (See Comments)    GIB  . Ferrous Fumarate Itching  . Ferrous Sulfate Diarrhea and Nausea And Vomiting  . Omeprazole Nausea And Vomiting  . Oxycodone Other (See Comments)    Reaction:  Makes pt hyper   . Prednisone Other (See Comments)    Reaction:  Makes pt hyper     Family History  Problem Relation Age of Onset  . Heart disease Brother   . Heart disease Sister     Social History Social History  Substance Use Topics  . Smoking status: Former Games developer  . Smokeless tobacco: Not on file  . Alcohol use No    Review of Systems Constitutional: Negative for fever. Cardiovascular: Negative for chest pain. Respiratory: Positive shortness of breath. Gastrointestinal: Negative for abdominal pain, vomiting and diarrhea. Genitourinary: Negative for dysuria. Musculoskeletal: Negative for back pain. Negative for leg pain or swelling. Skin: Negative for rash. Neurological: Negative for headaches, focal weakness or numbness. 10-point ROS otherwise negative.  ____________________________________________   PHYSICAL EXAM:  VITAL SIGNS: ED Triage Vitals [08/05/15 1840]  Enc Vitals Group     BP (!) 137/47     Pulse Rate 82     Resp 20     Temp 98.3 F (36.8 C)     Temp Source Oral     SpO2 100 %     Weight 99 lb (44.9 kg)     Height      Head Circumference      Peak Flow      Pain Score      Pain Loc      Pain Edu?      Excl. in GC?     Constitutional: Alert and oriented. Well  appearing and in no distress. Eyes: Normal exam ENT   Head: Normocephalic and atraumatic   Mouth/Throat: Mucous membranes are moist. Cardiovascular: Normal rate, regular rhythm. No murmur Respiratory: Normal respiratory effort without tachypnea nor retractions. Decreased air movement bilaterally. No obvious wheezes, rales or rhonchi. Gastrointestinal: Soft and nontender. No distention Musculoskeletal: Nontender with normal range of motion in all extremities. No lower extremity tenderness or edema. Neurologic:  Normal speech and language. No gross focal neurologic deficits are appreciated. Skin:  Skin is warm, dry and intact.  Psychiatric: Mood and affect are normal. Speech and behavior  are normal.   ____________________________________________    EKG  EKG reviewed and interpreted by myself shows normal sinus rhythm at 79 bpm, widened QRS, normal axis, nonspecific ST changes, most consistent with left bundle branch block. Evidence of left bundle branch block on old EKG.  ____________________________________________    RADIOLOGY  Chest x-ray shows hyperexpanded lungs consistent with COPD. No acute findings.  ____________________________________________   INITIAL IMPRESSION / ASSESSMENT AND PLAN / ED COURSE  Pertinent labs & imaging results that were available during my care of the patient were reviewed by me and considered in my medical decision making (see chart for details).  Patient presents the emergency department with worsening shortness breath since 6:30 PM. States it feels like her typical COPD exacerbation. We'll dose Solu-Medrol, DuoNeb's, ankles and monitor in the emergency department. Chest x-ray is clear, no lower extremity edema. No obvious wheezes but the patient does have decreased air movement bilaterally.  X-ray consistent with COPD. Labs are largely within normal limits. Patient states he feels much better and is requesting discharge papers. We will  discharge the 5 day course of prednisone, patient is continued take her nebulizers at home. We'll have her follow up with a primary care doctor on Monday. I discussed my normal COPD return precautions.  ____________________________________________   FINAL CLINICAL IMPRESSION(S) / ED DIAGNOSES  COPD exacerbation Dyspnea    Minna Antis, MD 08/05/15 2154

## 2015-08-06 DIAGNOSIS — J449 Chronic obstructive pulmonary disease, unspecified: Secondary | ICD-10-CM | POA: Diagnosis not present

## 2015-08-23 ENCOUNTER — Inpatient Hospital Stay: Payer: PPO | Attending: Hematology and Oncology

## 2015-08-23 DIAGNOSIS — Z87891 Personal history of nicotine dependence: Secondary | ICD-10-CM | POA: Diagnosis not present

## 2015-08-23 DIAGNOSIS — Z8711 Personal history of peptic ulcer disease: Secondary | ICD-10-CM | POA: Insufficient documentation

## 2015-08-23 DIAGNOSIS — I38 Endocarditis, valve unspecified: Secondary | ICD-10-CM | POA: Diagnosis not present

## 2015-08-23 DIAGNOSIS — E538 Deficiency of other specified B group vitamins: Secondary | ICD-10-CM | POA: Insufficient documentation

## 2015-08-23 DIAGNOSIS — E559 Vitamin D deficiency, unspecified: Secondary | ICD-10-CM | POA: Diagnosis not present

## 2015-08-23 DIAGNOSIS — I509 Heart failure, unspecified: Secondary | ICD-10-CM | POA: Insufficient documentation

## 2015-08-23 DIAGNOSIS — M5416 Radiculopathy, lumbar region: Secondary | ICD-10-CM | POA: Diagnosis not present

## 2015-08-23 DIAGNOSIS — D509 Iron deficiency anemia, unspecified: Secondary | ICD-10-CM | POA: Diagnosis not present

## 2015-08-23 DIAGNOSIS — Z79899 Other long term (current) drug therapy: Secondary | ICD-10-CM | POA: Diagnosis not present

## 2015-08-23 DIAGNOSIS — G2581 Restless legs syndrome: Secondary | ICD-10-CM | POA: Diagnosis not present

## 2015-08-23 DIAGNOSIS — I2510 Atherosclerotic heart disease of native coronary artery without angina pectoris: Secondary | ICD-10-CM | POA: Insufficient documentation

## 2015-08-23 DIAGNOSIS — E785 Hyperlipidemia, unspecified: Secondary | ICD-10-CM | POA: Diagnosis not present

## 2015-08-23 DIAGNOSIS — I110 Hypertensive heart disease with heart failure: Secondary | ICD-10-CM | POA: Diagnosis not present

## 2015-08-23 DIAGNOSIS — Z86010 Personal history of colonic polyps: Secondary | ICD-10-CM | POA: Insufficient documentation

## 2015-08-23 DIAGNOSIS — J449 Chronic obstructive pulmonary disease, unspecified: Secondary | ICD-10-CM | POA: Insufficient documentation

## 2015-08-23 DIAGNOSIS — M5136 Other intervertebral disc degeneration, lumbar region: Secondary | ICD-10-CM | POA: Insufficient documentation

## 2015-08-23 LAB — CBC WITH DIFFERENTIAL/PLATELET
Basophils Absolute: 0 10*3/uL (ref 0–0.1)
Basophils Relative: 1 %
Eosinophils Absolute: 0.1 10*3/uL (ref 0–0.7)
Eosinophils Relative: 1 %
HCT: 33.6 % — ABNORMAL LOW (ref 35.0–47.0)
Hemoglobin: 11.4 g/dL — ABNORMAL LOW (ref 12.0–16.0)
Lymphocytes Relative: 35 %
Lymphs Abs: 1.6 10*3/uL (ref 1.0–3.6)
MCH: 28.6 pg (ref 26.0–34.0)
MCHC: 34 g/dL (ref 32.0–36.0)
MCV: 84.1 fL (ref 80.0–100.0)
Monocytes Absolute: 0.4 10*3/uL (ref 0.2–0.9)
Monocytes Relative: 9 %
Neutro Abs: 2.5 10*3/uL (ref 1.4–6.5)
Neutrophils Relative %: 54 %
Platelets: 186 10*3/uL (ref 150–440)
RBC: 3.99 MIL/uL (ref 3.80–5.20)
RDW: 14.3 % (ref 11.5–14.5)
WBC: 4.7 10*3/uL (ref 3.6–11.0)

## 2015-08-23 LAB — FERRITIN: Ferritin: 26 ng/mL (ref 11–307)

## 2015-08-25 DIAGNOSIS — I10 Essential (primary) hypertension: Secondary | ICD-10-CM | POA: Diagnosis not present

## 2015-08-25 DIAGNOSIS — I2510 Atherosclerotic heart disease of native coronary artery without angina pectoris: Secondary | ICD-10-CM | POA: Diagnosis not present

## 2015-08-25 DIAGNOSIS — J441 Chronic obstructive pulmonary disease with (acute) exacerbation: Secondary | ICD-10-CM | POA: Diagnosis not present

## 2015-08-25 DIAGNOSIS — R0609 Other forms of dyspnea: Secondary | ICD-10-CM | POA: Diagnosis not present

## 2015-08-25 DIAGNOSIS — I5022 Chronic systolic (congestive) heart failure: Secondary | ICD-10-CM | POA: Diagnosis not present

## 2015-08-29 NOTE — Progress Notes (Signed)
[name]-  Cancer Center  Clinic day:  08/30/15  Chief Complaint: DEE PADEN is a 75 y.o. female with a history of iron deficiency anemia who is seen for 3 month assessment.  HPI:  The patient was last seen in the medical oncology clinic on 05/24/2015.  At that time, she felt better.  Ice pica had resolved.  Exam was stable.  Hematocrit had improved to 31.1.  Ferritin was 92.  She was seen in the ER on 08/05/2015 with shortness of breath.  CXR revealed hyper-expanded lungs consistent with COPD.  She received DuoNebs and solumedrol.  CBC revealed a hematocrit of 33.4, hemoglobin 11.1, and MCV 82.  Labs on 08/23/2015 revealed a hematocrit of 33.6, hemoglobin 11.4, MCV 84.1, platelets 186,000, and WBC 4700.  Ferritin was 26.  Symptomatically, she notes no problems. Diet is good. She is eating green leafy vegetables.  She states that she is having labs at Dr. Marcille Buffy office on 09/24/2015.   Past Medical History:  Diagnosis Date  . Allergic rhinitis   . Centrilobular emphysema (HCC)   . Chronic systolic CHF (congestive heart failure) (HCC)   . Colon polyposis   . COPD (chronic obstructive pulmonary disease) (HCC)   . Coronary artery disease involving native coronary artery of native heart without angina pectoris   . DDD (degenerative disc disease), lumbar   . Essential hypertension   . HLD (hyperlipidemia)   . Hypertension   . IDA (iron deficiency anemia)   . Low serum vitamin D   . Lumbar radiculitis   . Lumbar spondylitis (HCC)   . Lumbar stenosis with neurogenic claudication   . Melena   . PUD (peptic ulcer disease)   . Restless leg   . Rotator cuff rupture   . VHD (valvular heart disease)     Past Surgical History:  Procedure Laterality Date  . BREAST BIOPSY Right 11/20/11   neg [place] core  . COLECTOMY    . CORONARY ANGIOPLASTY WITH STENT PLACEMENT      Family History  Problem Relation Age of Onset  . Heart disease Brother   . Heart  disease Sister     Social History:  reports that she has quit smoking. She does not have any smokeless tobacco history on file. She reports that she does not drink alcohol or use drugs.  She has a greater than 30 pack year smoking history.  She quit smoking in 1995.  The patient's son can not bring her to clinic on Tuesdays and Thursdays.  Allergies:  Allergies  Allergen Reactions  . Ropinirole Anxiety and Other (See Comments)    Reaction:  Severe leg pain   . Aspirin Other (See Comments)    GIB  . Ferrous Fumarate Itching  . Ferrous Sulfate Diarrhea and Nausea And Vomiting  . Omeprazole Nausea And Vomiting  . Oxycodone Other (See Comments)    Reaction:  Makes pt hyper   . Prednisone Other (See Comments)    Reaction:  Makes pt hyper     Current Medications: Current Outpatient Prescriptions  Medication Sig Dispense Refill  . acetaminophen (TYLENOL) 500 MG tablet Take 500 mg by mouth every 6 (six) hours as needed for mild pain, moderate pain, fever or headache.     . albuterol (PROVENTIL HFA;VENTOLIN HFA) 108 (90 Base) MCG/ACT inhaler Inhale 2 puffs into the lungs every 4 (four) hours as needed for wheezing or shortness of breath.     Marland Kitchen albuterol (PROVENTIL) (2.5 MG/3ML) 0.083%  nebulizer solution Take 2.5 mg by nebulization every 6 (six) hours as needed for wheezing or shortness of breath.    . ALPRAZolam (XANAX) 0.25 MG tablet Take 0.25 mg by mouth at bedtime as needed for anxiety or sleep.    . budesonide-formoterol (SYMBICORT) 160-4.5 MCG/ACT inhaler Inhale 2 puffs into the lungs 2 (two) times daily.    . cyanocobalamin 1000 MCG tablet Take 1,000 mcg by mouth daily.    Marland Kitchen escitalopram (LEXAPRO) 10 MG tablet Take 10 mg by mouth daily.  0  . fluticasone (FLONASE) 50 MCG/ACT nasal spray Place 2 sprays into both nostrils daily.    . furosemide (LASIX) 20 MG tablet Take 20 mg by mouth daily.    Marland Kitchen gabapentin (NEURONTIN) 300 MG capsule Take 600 mg by mouth at bedtime.    Marland Kitchen lisinopril  (PRINIVIL,ZESTRIL) 2.5 MG tablet Take 2.5 mg by mouth daily.    . metoprolol succinate (TOPROL-XL) 25 MG 24 hr tablet Take 25 mg by mouth daily.  1  . predniSONE (DELTASONE) 20 MG tablet Take 2 tablets (40 mg total) by mouth daily. 10 tablet 0  . ranitidine (ZANTAC) 150 MG tablet Take 150 mg by mouth 2 (two) times daily.    . simvastatin (ZOCOR) 20 MG tablet Take 20 mg by mouth at bedtime.    Marland Kitchen SPIRIVA RESPIMAT 2.5 MCG/ACT AERS Inhale 2 puffs into the lungs daily.   0  . traMADol (ULTRAM) 50 MG tablet Take 75 mg by mouth 2 (two) times daily as needed for moderate pain.     . Vitamin D, Ergocalciferol, (DRISDOL) 50000 units CAPS capsule Take 50,000 Units by mouth every 7 (seven) days. Pt takes on Saturday.     No current facility-administered medications for this visit.     Review of Systems:  GENERAL:  Denies any problems.  No fevers or sweats.  Weight loss of 2 pounds.  Baseline weight 104-107 pounds. PERFORMANCE STATUS (ECOG):  1 HEENT:  No visual changes, runny nose, sore throat, mouth sores or tenderness. Lungs: COPD.  Shortness of breath with exertion.  No cough.  No hemoptysis. On oxygen 2 liters/min. Cardiac:  No chest pain, palpitations, orthopnea, or PND. GI:  Eat good.  No nausea, vomiting, diarrhea, constipation, melena or hematochezia. Hemorrhoids. GU:  No urgency, frequency, dysuria, or hematuria. Musculoskeletal:  Pain in hips down due to spinal stenosis.  No muscle tenderness. Extremities:  No pain or swelling. Skin:  No rashes or skin changes. Neuro:  Restless legs, better.  No headache, numbness or weakness, balance or coordination issues. Endocrine:  No diabetes, thyroid issues, hot flashes or night sweats. Psych:  No mood changes, depression or anxiety. Pain:  No focal pain. Review of systems:  All other systems reviewed and found to be negative.  Physical Exam: Blood pressure (!) 117/59, pulse 61, temperature 97.5 F (36.4 C), temperature source Tympanic, resp. rate  18, weight 100 lb 3.2 oz (45.5 kg). GENERAL:  Thin elderly woman sitting comfortably in the exam room in no acute distress. MENTAL STATUS:  Alert and oriented to person, place and time. HEAD:  Short styled gray hair.  Normocephalic, atraumatic, face symmetric, no Cushingoid features. EYES:  Blue eyes.  Pupils equal round and reactive to light and accomodation.  No conjunctivitis or scleral icterus. ENT:  Oropharynx clear without lesion.  Tongue normal. Mucous membranes moist.  RESPIRATORY:  Clear to auscultation without rales, wheezes or rhonchi. CARDIOVASCULAR:  Regular rate and rhythm without murmur, rub or gallop. ABDOMEN:  Soft, non-tender, with active bowel sounds, and no hepatosplenomegaly.  No masses. SKIN:  No rashes, ulcers or lesions. EXTREMITIES: No edema, no skin discoloration or tenderness.  No palpable cords. LYMPH NODES: No palpable cervical, supraclavicular, axillary or inguinal adenopathy  NEUROLOGICAL: Unremarkable. PSYCH:  Appropriate.    No visits with results within 3 Day(s) from this visit.  Latest known visit with results is:  Appointment on 08/23/2015  Component Date Value Ref Range Status  . WBC 08/23/2015 4.7  3.6 - 11.0 K/uL Final  . RBC 08/23/2015 3.99  3.80 - 5.20 MIL/uL Final  . Hemoglobin 08/23/2015 11.4* 12.0 - 16.0 g/dL Final  . HCT 19/14/782908/14/2017 33.6* 35.0 - 47.0 % Final  . MCV 08/23/2015 84.1  80.0 - 100.0 fL Final  . MCH 08/23/2015 28.6  26.0 - 34.0 pg Final  . MCHC 08/23/2015 34.0  32.0 - 36.0 g/dL Final  . RDW 56/21/308608/14/2017 14.3  11.5 - 14.5 % Final  . Platelets 08/23/2015 186  150 - 440 K/uL Final  . Neutrophils Relative % 08/23/2015 54  % Final  . Neutro Abs 08/23/2015 2.5  1.4 - 6.5 K/uL Final  . Lymphocytes Relative 08/23/2015 35  % Final  . Lymphs Abs 08/23/2015 1.6  1.0 - 3.6 K/uL Final  . Monocytes Relative 08/23/2015 9  % Final  . Monocytes Absolute 08/23/2015 0.4  0.2 - 0.9 K/uL Final  . Eosinophils Relative 08/23/2015 1  % Final  .  Eosinophils Absolute 08/23/2015 0.1  0 - 0.7 K/uL Final  . Basophils Relative 08/23/2015 1  % Final  . Basophils Absolute 08/23/2015 0.0  0 - 0.1 K/uL Final  . Ferritin 08/23/2015 26  11 - 307 ng/mL Final    Assessment:  Chesley NoonCarolyn F Kleckley is a 75 y.o. female with iron deficiency.  She has a history of bleeding gastric ulcer years ago and colonic polyps s/p partial colectomy in 2013.  She required 2 units of PRBCs in 2015.  Colonoscopy on 05/15/2013 revealed a patent ileocolonic anastomosis and internal hemorrhoids.  EGD was normal on 05/15/2013.  Guaiac cards were negative on 09/21/2014 and 05/03/2015.  Diet is modest.  Oral iron causes diarrhea.  She received 600 mg Venofer from 04/26/2015 - 05/17/2015.  She has a history of B12 deficiency and is on oral B12. Ferritin was 10 (low) on 03/26/2015.  Work-up on 04/19/2015 revealed a hematocrit of 28.3, hemoglobin 8.7, MCV 62.2, platelets 240,000, WBC 5500 with an ANC of 3000.  Ferritin was 5 (low) with an iron saturation of 3% (low) and a TIBC of 539 (high).  B12 and folate were normal.  She has COPD and is on oxygen.  She has a greater than 30 pack year smoking history.  Chest CT angiogram on 04/01/2014 revealed no pulmonary embolism but a 4 mm left upper lobe pulmonary nodule.  Chest CT on 03/05/2015 revealed a stable 3 mm lingular nodule.  There were new ground-glass opacities in the right upper lobe, right middle lobe and right lower lobe suggestive of an infectious or inflammatory process. Chest CT on 07/30/2015 revealed a stable 3 mm LUL nodule (benign).  Symptomatically, she feels "ok".  Exam is stable.  Hematocrit has improved to 33.6.  Ferritin has decreased from 92 to 26.  Plan: 1.  Review recent labs.  She has mild anemia.  Iron stores have decreased from 92 to 26.  Discuss IV iron as ferritin < 30.   2.  No Venofer today (patient declines).  Encourage iron rich  foods as oral iron causes diarrhea. 3.  Slip for Dr. Marcello FennelHande and labs (CBC,  ferritin) next month 4.  RTC in 3 months for MD assessment, labs (CBC, ferritin- day before) and +/- Venofer   Rosey BathMelissa C Corcoran, MD  08/30/2015, 3:02 PM

## 2015-08-30 ENCOUNTER — Encounter (INDEPENDENT_AMBULATORY_CARE_PROVIDER_SITE_OTHER): Payer: Self-pay

## 2015-08-30 ENCOUNTER — Other Ambulatory Visit: Payer: Self-pay | Admitting: *Deleted

## 2015-08-30 ENCOUNTER — Inpatient Hospital Stay: Payer: PPO

## 2015-08-30 ENCOUNTER — Inpatient Hospital Stay (HOSPITAL_BASED_OUTPATIENT_CLINIC_OR_DEPARTMENT_OTHER): Payer: PPO | Admitting: Hematology and Oncology

## 2015-08-30 ENCOUNTER — Encounter: Payer: Self-pay | Admitting: Hematology and Oncology

## 2015-08-30 VITALS — BP 117/59 | HR 61 | Temp 97.5°F | Resp 18 | Wt 100.2 lb

## 2015-08-30 DIAGNOSIS — E538 Deficiency of other specified B group vitamins: Secondary | ICD-10-CM

## 2015-08-30 DIAGNOSIS — I509 Heart failure, unspecified: Secondary | ICD-10-CM | POA: Diagnosis not present

## 2015-08-30 DIAGNOSIS — I2510 Atherosclerotic heart disease of native coronary artery without angina pectoris: Secondary | ICD-10-CM

## 2015-08-30 DIAGNOSIS — I110 Hypertensive heart disease with heart failure: Secondary | ICD-10-CM

## 2015-08-30 DIAGNOSIS — Z87891 Personal history of nicotine dependence: Secondary | ICD-10-CM

## 2015-08-30 DIAGNOSIS — E785 Hyperlipidemia, unspecified: Secondary | ICD-10-CM

## 2015-08-30 DIAGNOSIS — J449 Chronic obstructive pulmonary disease, unspecified: Secondary | ICD-10-CM | POA: Diagnosis not present

## 2015-08-30 DIAGNOSIS — D509 Iron deficiency anemia, unspecified: Secondary | ICD-10-CM

## 2015-08-30 DIAGNOSIS — I38 Endocarditis, valve unspecified: Secondary | ICD-10-CM

## 2015-08-30 DIAGNOSIS — Z86010 Personal history of colonic polyps: Secondary | ICD-10-CM

## 2015-08-30 DIAGNOSIS — E559 Vitamin D deficiency, unspecified: Secondary | ICD-10-CM

## 2015-08-30 DIAGNOSIS — G2581 Restless legs syndrome: Secondary | ICD-10-CM

## 2015-08-30 DIAGNOSIS — Z8711 Personal history of peptic ulcer disease: Secondary | ICD-10-CM

## 2015-08-30 DIAGNOSIS — R911 Solitary pulmonary nodule: Secondary | ICD-10-CM

## 2015-08-30 DIAGNOSIS — M5416 Radiculopathy, lumbar region: Secondary | ICD-10-CM

## 2015-08-30 DIAGNOSIS — Z79899 Other long term (current) drug therapy: Secondary | ICD-10-CM

## 2015-08-30 DIAGNOSIS — M5136 Other intervertebral disc degeneration, lumbar region: Secondary | ICD-10-CM

## 2015-08-30 NOTE — Progress Notes (Signed)
Patient is here today,

## 2015-09-05 ENCOUNTER — Encounter: Payer: Self-pay | Admitting: Hematology and Oncology

## 2015-09-06 DIAGNOSIS — J449 Chronic obstructive pulmonary disease, unspecified: Secondary | ICD-10-CM | POA: Diagnosis not present

## 2015-09-29 ENCOUNTER — Other Ambulatory Visit: Payer: Self-pay | Admitting: Hematology and Oncology

## 2015-09-29 ENCOUNTER — Telehealth: Payer: Self-pay | Admitting: *Deleted

## 2015-09-29 NOTE — Telephone Encounter (Signed)
States she is felling very tired and her legs are hurting (all previous signs of low iron) She is not resting at night. Asking to come in to have her labs checked. Please advise

## 2015-09-29 NOTE — Telephone Encounter (Signed)
Pt can come tom and have blood work and she wants to come a t3:30 but the venofer she would to come but it has not been approved per insurance and I will need to check and get that set up for another date. Pt agreeable for coming tom at 3:30 for lab work.

## 2015-09-29 NOTE — Telephone Encounter (Signed)
  Ok to check labs and schedule Venofer.  At last appt, she declined additional IV iron.   M

## 2015-09-30 ENCOUNTER — Other Ambulatory Visit: Payer: Self-pay

## 2015-09-30 ENCOUNTER — Inpatient Hospital Stay: Payer: PPO | Attending: Hematology and Oncology

## 2015-09-30 ENCOUNTER — Other Ambulatory Visit: Payer: Self-pay | Admitting: *Deleted

## 2015-09-30 ENCOUNTER — Telehealth: Payer: Self-pay | Admitting: *Deleted

## 2015-09-30 DIAGNOSIS — D509 Iron deficiency anemia, unspecified: Secondary | ICD-10-CM | POA: Diagnosis not present

## 2015-09-30 DIAGNOSIS — Z79899 Other long term (current) drug therapy: Secondary | ICD-10-CM | POA: Diagnosis not present

## 2015-09-30 LAB — CBC WITH DIFFERENTIAL/PLATELET
Basophils Absolute: 0 10*3/uL (ref 0–0.1)
Basophils Relative: 1 %
Eosinophils Absolute: 0 10*3/uL (ref 0–0.7)
Eosinophils Relative: 0 %
HCT: 33.8 % — ABNORMAL LOW (ref 35.0–47.0)
Hemoglobin: 11.8 g/dL — ABNORMAL LOW (ref 12.0–16.0)
Lymphocytes Relative: 41 %
Lymphs Abs: 2 10*3/uL (ref 1.0–3.6)
MCH: 29.7 pg (ref 26.0–34.0)
MCHC: 34.9 g/dL (ref 32.0–36.0)
MCV: 85.3 fL (ref 80.0–100.0)
Monocytes Absolute: 0.4 10*3/uL (ref 0.2–0.9)
Monocytes Relative: 8 %
Neutro Abs: 2.5 10*3/uL (ref 1.4–6.5)
Neutrophils Relative %: 50 %
Platelets: 214 10*3/uL (ref 150–440)
RBC: 3.97 MIL/uL (ref 3.80–5.20)
RDW: 14.7 % — ABNORMAL HIGH (ref 11.5–14.5)
WBC: 4.9 10*3/uL (ref 3.6–11.0)

## 2015-09-30 LAB — FERRITIN: Ferritin: 11 ng/mL (ref 11–307)

## 2015-09-30 NOTE — Telephone Encounter (Signed)
-----   Message from Melissa C Corcoran, MD sent at 09/30/2015  4:58 PM EDT ----- Regarding: Please call patient  Ferritin decreasing.   Is she interested in Venofer?  M  ----- Message ----- From: Interface, Lab In [facility] Sent: 09/30/2015   3:42 PM To: Melissa C Corcoran, MD    

## 2015-09-30 NOTE — Telephone Encounter (Signed)
Called and got pt's voicemail and left her the message about ferritin even lower and she does need venofer   X 3 doses and each treatment takes about 1 hour. Each dose will be 1 week apart. Asked for pt to call me back tom. And left my number

## 2015-10-05 ENCOUNTER — Telehealth: Payer: Self-pay | Admitting: *Deleted

## 2015-10-05 DIAGNOSIS — D508 Other iron deficiency anemias: Secondary | ICD-10-CM | POA: Diagnosis not present

## 2015-10-05 DIAGNOSIS — M25512 Pain in left shoulder: Secondary | ICD-10-CM | POA: Diagnosis not present

## 2015-10-05 DIAGNOSIS — J432 Centrilobular emphysema: Secondary | ICD-10-CM | POA: Diagnosis not present

## 2015-10-05 DIAGNOSIS — D509 Iron deficiency anemia, unspecified: Secondary | ICD-10-CM | POA: Diagnosis not present

## 2015-10-05 DIAGNOSIS — I5022 Chronic systolic (congestive) heart failure: Secondary | ICD-10-CM | POA: Diagnosis not present

## 2015-10-05 DIAGNOSIS — M5136 Other intervertebral disc degeneration, lumbar region: Secondary | ICD-10-CM | POA: Diagnosis not present

## 2015-10-05 DIAGNOSIS — J9611 Chronic respiratory failure with hypoxia: Secondary | ICD-10-CM | POA: Diagnosis not present

## 2015-10-05 DIAGNOSIS — I10 Essential (primary) hypertension: Secondary | ICD-10-CM | POA: Diagnosis not present

## 2015-10-05 DIAGNOSIS — Z1239 Encounter for other screening for malignant neoplasm of breast: Secondary | ICD-10-CM | POA: Diagnosis not present

## 2015-10-05 NOTE — Telephone Encounter (Signed)
Called pt and got her voicemail and then I tried her cell phone and she answered and told me she is willing to come over for venofer and she will check with her son and let me know and then she got him and he can bring her tom.  Colette made appt for tom 1:30 and she needs 2 more doses once a week and pt to call me back with son's schedule for next week on next week. Son does not get sch. Til Sunday each week.

## 2015-10-05 NOTE — Telephone Encounter (Signed)
-----   Message from Rosey BathMelissa C Corcoran, MD sent at 09/30/2015  4:58 PM EDT ----- Regarding: Please call patient  Ferritin decreasing.   Is she interested in Venofer?  M  ----- Message ----- From: Interface, Lab In [place] Sent: 09/30/2015   3:42 PM To: Rosey BathMelissa C Corcoran, MD

## 2015-10-06 ENCOUNTER — Inpatient Hospital Stay: Payer: PPO

## 2015-10-06 VITALS — BP 107/64 | HR 68

## 2015-10-06 DIAGNOSIS — D509 Iron deficiency anemia, unspecified: Secondary | ICD-10-CM

## 2015-10-06 MED ORDER — IRON SUCROSE 20 MG/ML IV SOLN
200.0000 mg | Freq: Once | INTRAVENOUS | Status: AC
Start: 2015-10-06 — End: 2015-10-06
  Administered 2015-10-06: 200 mg via INTRAVENOUS
  Filled 2015-10-06: qty 10

## 2015-10-06 MED ORDER — SODIUM CHLORIDE 0.9 % IV SOLN
Freq: Once | INTRAVENOUS | Status: AC
  Administered 2015-10-06: 14:00:00 via INTRAVENOUS
  Filled 2015-10-06: qty 1000

## 2015-10-07 DIAGNOSIS — J449 Chronic obstructive pulmonary disease, unspecified: Secondary | ICD-10-CM | POA: Diagnosis not present

## 2015-10-12 ENCOUNTER — Other Ambulatory Visit: Payer: Self-pay | Admitting: Hematology and Oncology

## 2015-10-12 ENCOUNTER — Inpatient Hospital Stay: Payer: PPO | Attending: Hematology and Oncology

## 2015-10-12 VITALS — BP 116/52 | HR 66 | Temp 96.1°F | Resp 18

## 2015-10-12 DIAGNOSIS — Z79899 Other long term (current) drug therapy: Secondary | ICD-10-CM | POA: Insufficient documentation

## 2015-10-12 DIAGNOSIS — D509 Iron deficiency anemia, unspecified: Secondary | ICD-10-CM | POA: Diagnosis not present

## 2015-10-12 MED ORDER — SODIUM CHLORIDE 0.9 % IV SOLN
200.0000 mg | Freq: Once | INTRAVENOUS | Status: AC
  Administered 2015-10-12: 200 mg via INTRAVENOUS
  Filled 2015-10-12: qty 10

## 2015-11-02 ENCOUNTER — Other Ambulatory Visit: Payer: Self-pay | Admitting: Internal Medicine

## 2015-11-02 DIAGNOSIS — G8929 Other chronic pain: Secondary | ICD-10-CM

## 2015-11-02 DIAGNOSIS — M5136 Other intervertebral disc degeneration, lumbar region: Secondary | ICD-10-CM | POA: Diagnosis not present

## 2015-11-02 DIAGNOSIS — D508 Other iron deficiency anemias: Secondary | ICD-10-CM | POA: Diagnosis not present

## 2015-11-02 DIAGNOSIS — Z0000 Encounter for general adult medical examination without abnormal findings: Secondary | ICD-10-CM | POA: Diagnosis not present

## 2015-11-02 DIAGNOSIS — I10 Essential (primary) hypertension: Secondary | ICD-10-CM | POA: Diagnosis not present

## 2015-11-02 DIAGNOSIS — M25512 Pain in left shoulder: Principal | ICD-10-CM

## 2015-11-02 DIAGNOSIS — I2510 Atherosclerotic heart disease of native coronary artery without angina pectoris: Secondary | ICD-10-CM | POA: Diagnosis not present

## 2015-11-02 DIAGNOSIS — J432 Centrilobular emphysema: Secondary | ICD-10-CM | POA: Diagnosis not present

## 2015-11-02 DIAGNOSIS — Z23 Encounter for immunization: Secondary | ICD-10-CM | POA: Diagnosis not present

## 2015-11-02 DIAGNOSIS — F411 Generalized anxiety disorder: Secondary | ICD-10-CM | POA: Diagnosis not present

## 2015-11-02 DIAGNOSIS — I5022 Chronic systolic (congestive) heart failure: Secondary | ICD-10-CM | POA: Diagnosis not present

## 2015-11-06 DIAGNOSIS — J449 Chronic obstructive pulmonary disease, unspecified: Secondary | ICD-10-CM | POA: Diagnosis not present

## 2015-11-17 ENCOUNTER — Ambulatory Visit: Admission: RE | Admit: 2015-11-17 | Payer: PPO | Source: Ambulatory Visit

## 2015-11-23 ENCOUNTER — Inpatient Hospital Stay: Payer: PPO | Attending: Hematology and Oncology

## 2015-11-23 DIAGNOSIS — M5136 Other intervertebral disc degeneration, lumbar region: Secondary | ICD-10-CM | POA: Insufficient documentation

## 2015-11-23 DIAGNOSIS — Z8711 Personal history of peptic ulcer disease: Secondary | ICD-10-CM | POA: Diagnosis not present

## 2015-11-23 DIAGNOSIS — E538 Deficiency of other specified B group vitamins: Secondary | ICD-10-CM | POA: Insufficient documentation

## 2015-11-23 DIAGNOSIS — I38 Endocarditis, valve unspecified: Secondary | ICD-10-CM | POA: Insufficient documentation

## 2015-11-23 DIAGNOSIS — D509 Iron deficiency anemia, unspecified: Secondary | ICD-10-CM | POA: Diagnosis not present

## 2015-11-23 DIAGNOSIS — R918 Other nonspecific abnormal finding of lung field: Secondary | ICD-10-CM | POA: Diagnosis not present

## 2015-11-23 DIAGNOSIS — Z86010 Personal history of colonic polyps: Secondary | ICD-10-CM | POA: Insufficient documentation

## 2015-11-23 DIAGNOSIS — Z8719 Personal history of other diseases of the digestive system: Secondary | ICD-10-CM | POA: Diagnosis not present

## 2015-11-23 DIAGNOSIS — I509 Heart failure, unspecified: Secondary | ICD-10-CM | POA: Insufficient documentation

## 2015-11-23 DIAGNOSIS — M4690 Unspecified inflammatory spondylopathy, site unspecified: Secondary | ICD-10-CM | POA: Insufficient documentation

## 2015-11-23 DIAGNOSIS — E785 Hyperlipidemia, unspecified: Secondary | ICD-10-CM | POA: Insufficient documentation

## 2015-11-23 DIAGNOSIS — Z87891 Personal history of nicotine dependence: Secondary | ICD-10-CM | POA: Insufficient documentation

## 2015-11-23 DIAGNOSIS — I10 Essential (primary) hypertension: Secondary | ICD-10-CM | POA: Diagnosis not present

## 2015-11-23 DIAGNOSIS — I2510 Atherosclerotic heart disease of native coronary artery without angina pectoris: Secondary | ICD-10-CM | POA: Diagnosis not present

## 2015-11-23 DIAGNOSIS — Z79899 Other long term (current) drug therapy: Secondary | ICD-10-CM | POA: Diagnosis not present

## 2015-11-23 DIAGNOSIS — G2581 Restless legs syndrome: Secondary | ICD-10-CM | POA: Diagnosis not present

## 2015-11-23 DIAGNOSIS — M5416 Radiculopathy, lumbar region: Secondary | ICD-10-CM | POA: Diagnosis not present

## 2015-11-23 DIAGNOSIS — E559 Vitamin D deficiency, unspecified: Secondary | ICD-10-CM | POA: Insufficient documentation

## 2015-11-23 DIAGNOSIS — J432 Centrilobular emphysema: Secondary | ICD-10-CM | POA: Diagnosis not present

## 2015-11-23 DIAGNOSIS — M48062 Spinal stenosis, lumbar region with neurogenic claudication: Secondary | ICD-10-CM | POA: Insufficient documentation

## 2015-11-23 DIAGNOSIS — J449 Chronic obstructive pulmonary disease, unspecified: Secondary | ICD-10-CM | POA: Insufficient documentation

## 2015-11-23 LAB — CBC WITH DIFFERENTIAL/PLATELET
Basophils Absolute: 0 10*3/uL (ref 0–0.1)
Basophils Relative: 1 %
Eosinophils Absolute: 0 10*3/uL (ref 0–0.7)
Eosinophils Relative: 1 %
HCT: 35.6 % (ref 35.0–47.0)
Hemoglobin: 12 g/dL (ref 12.0–16.0)
Lymphocytes Relative: 49 %
Lymphs Abs: 1.9 10*3/uL (ref 1.0–3.6)
MCH: 29.5 pg (ref 26.0–34.0)
MCHC: 33.6 g/dL (ref 32.0–36.0)
MCV: 87.8 fL (ref 80.0–100.0)
Monocytes Absolute: 0.3 10*3/uL (ref 0.2–0.9)
Monocytes Relative: 8 %
Neutro Abs: 1.6 10*3/uL (ref 1.4–6.5)
Neutrophils Relative %: 41 %
Platelets: 177 10*3/uL (ref 150–440)
RBC: 4.06 MIL/uL (ref 3.80–5.20)
RDW: 14.7 % — ABNORMAL HIGH (ref 11.5–14.5)
WBC: 4 10*3/uL (ref 3.6–11.0)

## 2015-11-23 LAB — FERRITIN: Ferritin: 35 ng/mL (ref 11–307)

## 2015-11-24 ENCOUNTER — Ambulatory Visit
Admission: RE | Admit: 2015-11-24 | Discharge: 2015-11-24 | Disposition: A | Discharge status: Home / Self Care | Payer: PPO | Source: Ambulatory Visit | Attending: Internal Medicine | Admitting: Internal Medicine

## 2015-11-24 DIAGNOSIS — G8929 Other chronic pain: Secondary | ICD-10-CM

## 2015-11-24 DIAGNOSIS — M25512 Pain in left shoulder: Secondary | ICD-10-CM | POA: Diagnosis not present

## 2015-11-24 DIAGNOSIS — M75102 Unspecified rotator cuff tear or rupture of left shoulder, not specified as traumatic: Secondary | ICD-10-CM | POA: Insufficient documentation

## 2015-11-24 DIAGNOSIS — S46012A Strain of muscle(s) and tendon(s) of the rotator cuff of left shoulder, initial encounter: Secondary | ICD-10-CM | POA: Diagnosis not present

## 2015-11-24 DIAGNOSIS — M19012 Primary osteoarthritis, left shoulder: Secondary | ICD-10-CM | POA: Insufficient documentation

## 2015-11-25 ENCOUNTER — Other Ambulatory Visit: Payer: Self-pay | Admitting: *Deleted

## 2015-11-25 DIAGNOSIS — D508 Other iron deficiency anemias: Secondary | ICD-10-CM

## 2015-11-26 ENCOUNTER — Other Ambulatory Visit: Payer: PPO

## 2015-11-30 ENCOUNTER — Inpatient Hospital Stay (HOSPITAL_BASED_OUTPATIENT_CLINIC_OR_DEPARTMENT_OTHER): Payer: PPO | Admitting: Hematology and Oncology

## 2015-11-30 ENCOUNTER — Inpatient Hospital Stay: Payer: PPO

## 2015-11-30 VITALS — BP 123/71 | HR 80 | Temp 95.9°F | Resp 18 | Wt 100.5 lb

## 2015-11-30 DIAGNOSIS — M48062 Spinal stenosis, lumbar region with neurogenic claudication: Secondary | ICD-10-CM

## 2015-11-30 DIAGNOSIS — E538 Deficiency of other specified B group vitamins: Secondary | ICD-10-CM | POA: Diagnosis not present

## 2015-11-30 DIAGNOSIS — I2510 Atherosclerotic heart disease of native coronary artery without angina pectoris: Secondary | ICD-10-CM

## 2015-11-30 DIAGNOSIS — E785 Hyperlipidemia, unspecified: Secondary | ICD-10-CM

## 2015-11-30 DIAGNOSIS — J432 Centrilobular emphysema: Secondary | ICD-10-CM | POA: Diagnosis not present

## 2015-11-30 DIAGNOSIS — E559 Vitamin D deficiency, unspecified: Secondary | ICD-10-CM

## 2015-11-30 DIAGNOSIS — I509 Heart failure, unspecified: Secondary | ICD-10-CM

## 2015-11-30 DIAGNOSIS — Z8719 Personal history of other diseases of the digestive system: Secondary | ICD-10-CM

## 2015-11-30 DIAGNOSIS — I38 Endocarditis, valve unspecified: Secondary | ICD-10-CM

## 2015-11-30 DIAGNOSIS — D509 Iron deficiency anemia, unspecified: Secondary | ICD-10-CM | POA: Diagnosis not present

## 2015-11-30 DIAGNOSIS — J449 Chronic obstructive pulmonary disease, unspecified: Secondary | ICD-10-CM

## 2015-11-30 DIAGNOSIS — M5416 Radiculopathy, lumbar region: Secondary | ICD-10-CM

## 2015-11-30 DIAGNOSIS — R918 Other nonspecific abnormal finding of lung field: Secondary | ICD-10-CM

## 2015-11-30 DIAGNOSIS — I10 Essential (primary) hypertension: Secondary | ICD-10-CM

## 2015-11-30 DIAGNOSIS — M4690 Unspecified inflammatory spondylopathy, site unspecified: Secondary | ICD-10-CM

## 2015-11-30 DIAGNOSIS — Z86010 Personal history of colonic polyps: Secondary | ICD-10-CM

## 2015-11-30 DIAGNOSIS — Z79899 Other long term (current) drug therapy: Secondary | ICD-10-CM

## 2015-11-30 DIAGNOSIS — G2581 Restless legs syndrome: Secondary | ICD-10-CM

## 2015-11-30 DIAGNOSIS — Z87891 Personal history of nicotine dependence: Secondary | ICD-10-CM

## 2015-11-30 DIAGNOSIS — M5136 Other intervertebral disc degeneration, lumbar region: Secondary | ICD-10-CM

## 2015-11-30 DIAGNOSIS — Z8711 Personal history of peptic ulcer disease: Secondary | ICD-10-CM

## 2015-11-30 NOTE — Progress Notes (Signed)
[name]-  Cancer Center  Clinic day:  11/30/15  Chief Complaint: Tanya Rhodes is a 75 y.o. female with a history of iron deficiency anemia who is seen for 3 month assessment.  HPI:  The patient was last seen in the medical oncology clinic on 08/30/2015.  At that time, she felt "ok".  Exam was stable.  Hematocrit had improved to 33.6.  Ferritin had decreased from 92 to 26.  She declined Venofer.  Iron rich foods were encouraged as oral iron caused diarrhea.  Patient contacted the clinic on 09/29/2015 feeling very tired.  CBC on 09/30/2015 included a hematocrit of 33.8, hemoglobin 11.8, MCV 85.3, platelets 214,000, white count 4900 with an ANC of 2500. Ferritin was 11.  She received Venofer 200 mg IV on 10/06/2015 and 10/12/2015.  CBC on 11/23/2015 revealed a hematocrit of 35.6, hemoglobin 12.0, and MCV 87.8.  Ferritin was 35.  During the interim, she has been walking a lot.  She goes to the mall with her son.  She is eating vegetables and iron rich cereal.   Past Medical History:  Diagnosis Date  . Allergic rhinitis   . Centrilobular emphysema (HCC)   . Chronic systolic CHF (congestive heart failure) (HCC)   . Colon polyposis   . COPD (chronic obstructive pulmonary disease) (HCC)   . Coronary artery disease involving native coronary artery of native heart without angina pectoris   . DDD (degenerative disc disease), lumbar   . Essential hypertension   . HLD (hyperlipidemia)   . Hypertension   . IDA (iron deficiency anemia)   . Low serum vitamin D   . Lumbar radiculitis   . Lumbar spondylitis (HCC)   . Lumbar stenosis with neurogenic claudication   . Melena   . PUD (peptic ulcer disease)   . Restless leg   . Rotator cuff rupture   . VHD (valvular heart disease)     Past Surgical History:  Procedure Laterality Date  . BREAST BIOPSY Right 11/20/11   neg [place] core  . COLECTOMY    . CORONARY ANGIOPLASTY WITH STENT PLACEMENT      Family History   Problem Relation Age of Onset  . Heart disease Brother   . Heart disease Sister     Social History:  reports that she has quit smoking. She does not have any smokeless tobacco history on file. She reports that she does not drink alcohol or use drugs.  She has a greater than 30 pack year smoking history.  She quit smoking in 1995.  The patient's son can not bring her to clinic on Tuesdays and Thursdays.  She is alone today.  Allergies:  Allergies  Allergen Reactions  . Ropinirole Anxiety and Other (See Comments)    Reaction:  Severe leg pain   . Aspirin Other (See Comments)    GIB  . Ferrous Fumarate Itching  . Ferrous Sulfate Diarrhea and Nausea And Vomiting  . Omeprazole Nausea And Vomiting  . Oxycodone Other (See Comments)    Reaction:  Makes pt hyper   . Prednisone Other (See Comments)    Reaction:  Makes pt hyper     Current Medications: Current Outpatient Prescriptions  Medication Sig Dispense Refill  . acetaminophen (TYLENOL) 500 MG tablet Take 500 mg by mouth every 6 (six) hours as needed for mild pain, moderate pain, fever or headache.     . albuterol (PROVENTIL HFA;VENTOLIN HFA) 108 (90 Base) MCG/ACT inhaler Inhale 2 puffs into the lungs  every 4 (four) hours as needed for wheezing or shortness of breath.     Marland Kitchen. albuterol (PROVENTIL) (2.5 MG/3ML) 0.083% nebulizer solution Take 2.5 mg by nebulization every 6 (six) hours as needed for wheezing or shortness of breath.    . ALPRAZolam (XANAX) 0.25 MG tablet Take 0.25 mg by mouth at bedtime as needed for anxiety or sleep.    . budesonide-formoterol (SYMBICORT) 160-4.5 MCG/ACT inhaler Inhale 2 puffs into the lungs 2 (two) times daily.    . cyanocobalamin 1000 MCG tablet Take 1,000 mcg by mouth daily.    Marland Kitchen. escitalopram (LEXAPRO) 10 MG tablet Take 10 mg by mouth daily.  0  . fluticasone (FLONASE) 50 MCG/ACT nasal spray Place 2 sprays into both nostrils daily.    . furosemide (LASIX) 20 MG tablet Take 20 mg by mouth daily.    Marland Kitchen.  gabapentin (NEURONTIN) 300 MG capsule Take 600 mg by mouth at bedtime.    Marland Kitchen. lisinopril (PRINIVIL,ZESTRIL) 2.5 MG tablet Take 2.5 mg by mouth daily.    . metoprolol succinate (TOPROL-XL) 25 MG 24 hr tablet Take 25 mg by mouth daily.  1  . ranitidine (ZANTAC) 150 MG tablet Take 150 mg by mouth 2 (two) times daily.    . simvastatin (ZOCOR) 20 MG tablet Take 20 mg by mouth at bedtime.    Marland Kitchen. SPIRIVA RESPIMAT 2.5 MCG/ACT AERS Inhale 2 puffs into the lungs daily.   0  . traMADol (ULTRAM) 50 MG tablet Take 75 mg by mouth 2 (two) times daily as needed for moderate pain.     . Vitamin D, Ergocalciferol, (DRISDOL) 50000 units CAPS capsule Take 50,000 Units by mouth every 7 (seven) days. Pt takes on Saturday.     No current facility-administered medications for this visit.     Review of Systems:  GENERAL:  Feels "ok".  No fevers or sweats.  Weight stable.  Baseline weight 104-107 pounds. PERFORMANCE STATUS (ECOG):  1 HEENT:  No visual changes, runny nose, sore throat, mouth sores or tenderness. Lungs: COPD.  Shortness of breath with exertion.  No cough.  No hemoptysis. On oxygen 2 liters/min. Cardiac:  No chest pain, palpitations, orthopnea, or PND. GI:  Eating well (see HPI).  No nausea, vomiting, diarrhea, constipation, melena or hematochezia. Hemorrhoids. GU:  No urgency, frequency, dysuria, or hematuria. Musculoskeletal:  Pain in hips down due to spinal stenosis.  No muscle tenderness. Extremities:  No pain or swelling. Skin:  No rashes or skin changes. Neuro:  Restless legs, improved.  No headache, numbness or weakness, balance or coordination issues. Endocrine:  No diabetes, thyroid issues, hot flashes or night sweats. Psych:  No mood changes, depression or anxiety. Pain:  No focal pain. Review of systems:  All other systems reviewed and found to be negative.  Physical Exam: Blood pressure 123/71, pulse 80, temperature (!) 95.9 F (35.5 C), temperature source Tympanic, resp. rate 18, weight  100 lb 8.5 oz (45.6 kg). GENERAL:  Thin elderly woman sitting comfortably in the exam room in no acute distress. MENTAL STATUS:  Alert and oriented to person, place and time. HEAD:  Short styled gray hair.  Normocephalic, atraumatic, face symmetric, no Cushingoid features. EYES:  Blue eyes.  Pupils equal round and reactive to light and accomodation.  No conjunctivitis or scleral icterus. ENT:  Oropharynx clear without lesion.  Tongue normal. Mucous membranes moist.  RESPIRATORY:  Clear to auscultation without rales, wheezes or rhonchi. CARDIOVASCULAR:  Regular rate and rhythm without murmur, rub or gallop. ABDOMEN:  Soft, non-tender, with active bowel sounds, and no hepatosplenomegaly.  No masses. SKIN:  No rashes, ulcers or lesions. EXTREMITIES: No edema, no skin discoloration or tenderness.  No palpable cords. LYMPH NODES: No palpable cervical, supraclavicular, axillary or inguinal adenopathy  NEUROLOGICAL: Unremarkable. PSYCH:  Appropriate.    No visits with results within 3 Day(s) from this visit.  Latest known visit with results is:  Appointment on 11/23/2015  Component Date Value Ref Range Status  . WBC 11/23/2015 4.0  3.6 - 11.0 K/uL Final  . RBC 11/23/2015 4.06  3.80 - 5.20 MIL/uL Final  . Hemoglobin 11/23/2015 12.0  12.0 - 16.0 g/dL Final  . HCT 16/10/960411/14/2017 35.6  35.0 - 47.0 % Final  . MCV 11/23/2015 87.8  80.0 - 100.0 fL Final  . MCH 11/23/2015 29.5  26.0 - 34.0 pg Final  . MCHC 11/23/2015 33.6  32.0 - 36.0 g/dL Final  . RDW 54/09/811911/14/2017 14.7* 11.5 - 14.5 % Final  . Platelets 11/23/2015 177  150 - 440 K/uL Final  . Neutrophils Relative % 11/23/2015 41  % Final  . Neutro Abs 11/23/2015 1.6  1.4 - 6.5 K/uL Final  . Lymphocytes Relative 11/23/2015 49  % Final  . Lymphs Abs 11/23/2015 1.9  1.0 - 3.6 K/uL Final  . Monocytes Relative 11/23/2015 8  % Final  . Monocytes Absolute 11/23/2015 0.3  0.2 - 0.9 K/uL Final  . Eosinophils Relative 11/23/2015 1  % Final  . Eosinophils  Absolute 11/23/2015 0.0  0 - 0.7 K/uL Final  . Basophils Relative 11/23/2015 1  % Final  . Basophils Absolute 11/23/2015 0.0  0 - 0.1 K/uL Final  . Ferritin 11/23/2015 35  11 - 307 ng/mL Final    Assessment:  Chesley NoonCarolyn F Pappalardo is a 75 y.o. female with iron deficiency.  She has a history of bleeding gastric ulcer years ago and colonic polyps s/p partial colectomy in 2013.  She required 2 units of PRBCs in 2015.  Colonoscopy on 05/15/2013 revealed a patent ileocolonic anastomosis and internal hemorrhoids.  EGD was normal on 05/15/2013.  Guaiac cards were negative on 09/21/2014 and 05/03/2015.  Diet is modest.  Oral iron causes diarrhea.  She received Venofer 600 mg (04/26/2015 - 05/17/2015) and 400 mg (10/06/2015- 10/12/2015).  She has a history of B12 deficiency and is on oral B12. Ferritin was 10 (low) on 03/26/2015.  Work-up on 04/19/2015 revealed a hematocrit of 28.3, hemoglobin 8.7, MCV 62.2, platelets 240,000, WBC 5500 with an ANC of 3000.  Ferritin was 5 (low) with an iron saturation of 3% (low) and a TIBC of 539 (high).  B12 and folate were normal.  She has COPD and is on oxygen.  She has a greater than 30 pack year smoking history.  Chest CT angiogram on 04/01/2014 revealed no pulmonary embolism but a 4 mm left upper lobe pulmonary nodule.  Chest CT on 03/05/2015 revealed a stable 3 mm lingular nodule.  There were new ground-glass opacities in the right upper lobe, right middle lobe and right lower lobe suggestive of an infectious or inflammatory process. Chest CT on 07/30/2015 revealed a stable 3 mm LUL nodule (benign).  Symptomatically, she feels "ok".  Exam is stable.  Hematocrit has improved to 35.6.  Ferritin is 35.  Plan: 1.  Review labs from 11/23/2015.  She is not anemic.  RBCs are not microcytic. Ferritin is adequate.  Discuss IV iron as ferritin < 30.   2.  No Venofer today 3.  RTC in 3  months for labs (CBC with diff, ferritin) 4.  RTC in 6 months for MD assessment and labs  (CBC with diff, ferritin- day before) +/- Venofer.   Rosey Bath, MD  11/30/2015, 11:44 AM

## 2015-12-07 DIAGNOSIS — J449 Chronic obstructive pulmonary disease, unspecified: Secondary | ICD-10-CM | POA: Diagnosis not present

## 2015-12-20 DIAGNOSIS — M75122 Complete rotator cuff tear or rupture of left shoulder, not specified as traumatic: Secondary | ICD-10-CM | POA: Diagnosis not present

## 2015-12-20 DIAGNOSIS — M7582 Other shoulder lesions, left shoulder: Secondary | ICD-10-CM | POA: Diagnosis not present

## 2016-01-06 DIAGNOSIS — J449 Chronic obstructive pulmonary disease, unspecified: Secondary | ICD-10-CM | POA: Diagnosis not present

## 2016-01-19 ENCOUNTER — Telehealth: Payer: Self-pay | Admitting: *Deleted

## 2016-01-19 DIAGNOSIS — D508 Other iron deficiency anemias: Secondary | ICD-10-CM

## 2016-01-19 NOTE — Telephone Encounter (Signed)
States she feels like her iron is low and wants it checked. [facility] as your note indicates. Please advise

## 2016-01-19 NOTE — Telephone Encounter (Signed)
  We can check:  CBC and ferritin.  M

## 2016-01-23 ENCOUNTER — Encounter: Payer: Self-pay | Admitting: Hematology and Oncology

## 2016-01-24 ENCOUNTER — Inpatient Hospital Stay: Payer: PPO | Attending: Hematology and Oncology

## 2016-01-24 ENCOUNTER — Telehealth: Payer: Self-pay | Admitting: *Deleted

## 2016-01-24 DIAGNOSIS — E538 Deficiency of other specified B group vitamins: Secondary | ICD-10-CM | POA: Insufficient documentation

## 2016-01-24 DIAGNOSIS — Z87891 Personal history of nicotine dependence: Secondary | ICD-10-CM | POA: Insufficient documentation

## 2016-01-24 DIAGNOSIS — I10 Essential (primary) hypertension: Secondary | ICD-10-CM | POA: Insufficient documentation

## 2016-01-24 DIAGNOSIS — M5136 Other intervertebral disc degeneration, lumbar region: Secondary | ICD-10-CM | POA: Insufficient documentation

## 2016-01-24 DIAGNOSIS — Z9981 Dependence on supplemental oxygen: Secondary | ICD-10-CM | POA: Insufficient documentation

## 2016-01-24 DIAGNOSIS — I509 Heart failure, unspecified: Secondary | ICD-10-CM | POA: Insufficient documentation

## 2016-01-24 DIAGNOSIS — J449 Chronic obstructive pulmonary disease, unspecified: Secondary | ICD-10-CM | POA: Insufficient documentation

## 2016-01-24 DIAGNOSIS — Z8711 Personal history of peptic ulcer disease: Secondary | ICD-10-CM | POA: Insufficient documentation

## 2016-01-24 DIAGNOSIS — Z7951 Long term (current) use of inhaled steroids: Secondary | ICD-10-CM | POA: Insufficient documentation

## 2016-01-24 DIAGNOSIS — E559 Vitamin D deficiency, unspecified: Secondary | ICD-10-CM | POA: Insufficient documentation

## 2016-01-24 DIAGNOSIS — M5412 Radiculopathy, cervical region: Secondary | ICD-10-CM | POA: Insufficient documentation

## 2016-01-24 DIAGNOSIS — Z79899 Other long term (current) drug therapy: Secondary | ICD-10-CM | POA: Insufficient documentation

## 2016-01-24 DIAGNOSIS — Z86010 Personal history of colonic polyps: Secondary | ICD-10-CM | POA: Insufficient documentation

## 2016-01-24 DIAGNOSIS — E785 Hyperlipidemia, unspecified: Secondary | ICD-10-CM | POA: Insufficient documentation

## 2016-01-24 DIAGNOSIS — M48062 Spinal stenosis, lumbar region with neurogenic claudication: Secondary | ICD-10-CM | POA: Insufficient documentation

## 2016-01-24 DIAGNOSIS — D509 Iron deficiency anemia, unspecified: Secondary | ICD-10-CM | POA: Insufficient documentation

## 2016-01-24 DIAGNOSIS — I2510 Atherosclerotic heart disease of native coronary artery without angina pectoris: Secondary | ICD-10-CM | POA: Insufficient documentation

## 2016-01-24 DIAGNOSIS — J439 Emphysema, unspecified: Secondary | ICD-10-CM | POA: Insufficient documentation

## 2016-01-24 DIAGNOSIS — G2581 Restless legs syndrome: Secondary | ICD-10-CM | POA: Insufficient documentation

## 2016-01-24 NOTE — Telephone Encounter (Signed)
-----   Message from Rosey BathMelissa C Corcoran, MD sent at 01/23/2016  7:07 AM EST ----- Regarding: Please call patient and set up labs  Pt called last week and was to have CBC and ferritin as she felt tired.  M

## 2016-01-24 NOTE — Telephone Encounter (Signed)
Called patient and LVM that she can come today for labs since she has been fatigued.

## 2016-01-25 ENCOUNTER — Telehealth: Payer: Self-pay | Admitting: *Deleted

## 2016-01-25 ENCOUNTER — Other Ambulatory Visit: Payer: PPO

## 2016-01-25 ENCOUNTER — Inpatient Hospital Stay: Payer: PPO

## 2016-01-25 ENCOUNTER — Other Ambulatory Visit: Payer: Self-pay | Admitting: *Deleted

## 2016-01-25 DIAGNOSIS — E785 Hyperlipidemia, unspecified: Secondary | ICD-10-CM | POA: Diagnosis not present

## 2016-01-25 DIAGNOSIS — D508 Other iron deficiency anemias: Secondary | ICD-10-CM

## 2016-01-25 DIAGNOSIS — Z87891 Personal history of nicotine dependence: Secondary | ICD-10-CM | POA: Diagnosis not present

## 2016-01-25 DIAGNOSIS — M5136 Other intervertebral disc degeneration, lumbar region: Secondary | ICD-10-CM | POA: Diagnosis not present

## 2016-01-25 DIAGNOSIS — M48062 Spinal stenosis, lumbar region with neurogenic claudication: Secondary | ICD-10-CM | POA: Diagnosis not present

## 2016-01-25 DIAGNOSIS — M5412 Radiculopathy, cervical region: Secondary | ICD-10-CM | POA: Diagnosis not present

## 2016-01-25 DIAGNOSIS — Z86010 Personal history of colonic polyps: Secondary | ICD-10-CM | POA: Diagnosis not present

## 2016-01-25 DIAGNOSIS — I10 Essential (primary) hypertension: Secondary | ICD-10-CM | POA: Diagnosis not present

## 2016-01-25 DIAGNOSIS — E538 Deficiency of other specified B group vitamins: Secondary | ICD-10-CM | POA: Diagnosis not present

## 2016-01-25 DIAGNOSIS — I2510 Atherosclerotic heart disease of native coronary artery without angina pectoris: Secondary | ICD-10-CM | POA: Diagnosis not present

## 2016-01-25 DIAGNOSIS — J449 Chronic obstructive pulmonary disease, unspecified: Secondary | ICD-10-CM | POA: Diagnosis not present

## 2016-01-25 DIAGNOSIS — Z7951 Long term (current) use of inhaled steroids: Secondary | ICD-10-CM | POA: Diagnosis not present

## 2016-01-25 DIAGNOSIS — Z79899 Other long term (current) drug therapy: Secondary | ICD-10-CM | POA: Diagnosis not present

## 2016-01-25 DIAGNOSIS — Z9981 Dependence on supplemental oxygen: Secondary | ICD-10-CM | POA: Diagnosis not present

## 2016-01-25 DIAGNOSIS — I509 Heart failure, unspecified: Secondary | ICD-10-CM | POA: Diagnosis not present

## 2016-01-25 DIAGNOSIS — D509 Iron deficiency anemia, unspecified: Secondary | ICD-10-CM | POA: Diagnosis not present

## 2016-01-25 DIAGNOSIS — J439 Emphysema, unspecified: Secondary | ICD-10-CM | POA: Diagnosis not present

## 2016-01-25 DIAGNOSIS — E559 Vitamin D deficiency, unspecified: Secondary | ICD-10-CM | POA: Diagnosis not present

## 2016-01-25 DIAGNOSIS — G2581 Restless legs syndrome: Secondary | ICD-10-CM | POA: Diagnosis not present

## 2016-01-25 DIAGNOSIS — Z8711 Personal history of peptic ulcer disease: Secondary | ICD-10-CM | POA: Diagnosis not present

## 2016-01-25 LAB — CBC WITH DIFFERENTIAL/PLATELET
Basophils Absolute: 0 10*3/uL (ref 0–0.1)
Basophils Relative: 1 %
Eosinophils Absolute: 0.1 10*3/uL (ref 0–0.7)
Eosinophils Relative: 2 %
HCT: 35.2 % (ref 35.0–47.0)
Hemoglobin: 12.1 g/dL (ref 12.0–16.0)
Lymphocytes Relative: 25 %
Lymphs Abs: 1.2 10*3/uL (ref 1.0–3.6)
MCH: 30.1 pg (ref 26.0–34.0)
MCHC: 34.3 g/dL (ref 32.0–36.0)
MCV: 87.7 fL (ref 80.0–100.0)
Monocytes Absolute: 0.5 10*3/uL (ref 0.2–0.9)
Monocytes Relative: 10 %
Neutro Abs: 2.9 10*3/uL (ref 1.4–6.5)
Neutrophils Relative %: 62 %
Platelets: 227 10*3/uL (ref 150–440)
RBC: 4.02 MIL/uL (ref 3.80–5.20)
RDW: 13 % (ref 11.5–14.5)
WBC: 4.7 10*3/uL (ref 3.6–11.0)

## 2016-01-25 LAB — FERRITIN: Ferritin: 29 ng/mL (ref 11–307)

## 2016-01-25 NOTE — Telephone Encounter (Signed)
Patient came for labs (CBC, Ferritin) Tuesday, January 16.  Call patient with results.

## 2016-01-26 ENCOUNTER — Telehealth: Payer: Self-pay | Admitting: *Deleted

## 2016-01-26 NOTE — Telephone Encounter (Signed)
  She is not anemic or microcytic.  Ferritin is < 30.  We could give her Venofer infusion x 1.  She should be seen to see if something else if going on.  M

## 2016-01-26 NOTE — Telephone Encounter (Signed)
Would you take a look at the labs for this patient that were drawn yesterday?  Her ferritin has dropped since the last lab.  This is the patient that called and said she has been more tired and weak lately.  Thanks, Synetta FailAnita

## 2016-01-28 ENCOUNTER — Telehealth: Payer: Self-pay | Admitting: *Deleted

## 2016-01-28 NOTE — Telephone Encounter (Signed)
Called patient to inform her of recent ferritin <30, voiced understanding, appointments given to pt for f/u with MD and venofer infusion.

## 2016-02-01 ENCOUNTER — Inpatient Hospital Stay: Payer: PPO

## 2016-02-01 ENCOUNTER — Inpatient Hospital Stay (HOSPITAL_BASED_OUTPATIENT_CLINIC_OR_DEPARTMENT_OTHER): Payer: PPO | Admitting: Hematology and Oncology

## 2016-02-01 VITALS — BP 128/61 | HR 86 | Temp 96.5°F | Resp 18 | Wt 102.5 lb

## 2016-02-01 DIAGNOSIS — Z86010 Personal history of colonic polyps: Secondary | ICD-10-CM

## 2016-02-01 DIAGNOSIS — M5136 Other intervertebral disc degeneration, lumbar region: Secondary | ICD-10-CM

## 2016-02-01 DIAGNOSIS — G2581 Restless legs syndrome: Secondary | ICD-10-CM

## 2016-02-01 DIAGNOSIS — D509 Iron deficiency anemia, unspecified: Secondary | ICD-10-CM | POA: Diagnosis not present

## 2016-02-01 DIAGNOSIS — M5412 Radiculopathy, cervical region: Secondary | ICD-10-CM | POA: Diagnosis not present

## 2016-02-01 DIAGNOSIS — D559 Anemia due to enzyme disorder, unspecified: Secondary | ICD-10-CM

## 2016-02-01 DIAGNOSIS — Z7951 Long term (current) use of inhaled steroids: Secondary | ICD-10-CM

## 2016-02-01 DIAGNOSIS — I2510 Atherosclerotic heart disease of native coronary artery without angina pectoris: Secondary | ICD-10-CM | POA: Diagnosis not present

## 2016-02-01 DIAGNOSIS — E785 Hyperlipidemia, unspecified: Secondary | ICD-10-CM | POA: Diagnosis not present

## 2016-02-01 DIAGNOSIS — I10 Essential (primary) hypertension: Secondary | ICD-10-CM

## 2016-02-01 DIAGNOSIS — Z9981 Dependence on supplemental oxygen: Secondary | ICD-10-CM

## 2016-02-01 DIAGNOSIS — Z79899 Other long term (current) drug therapy: Secondary | ICD-10-CM

## 2016-02-01 DIAGNOSIS — Z87891 Personal history of nicotine dependence: Secondary | ICD-10-CM

## 2016-02-01 DIAGNOSIS — E538 Deficiency of other specified B group vitamins: Secondary | ICD-10-CM | POA: Diagnosis not present

## 2016-02-01 DIAGNOSIS — Z8711 Personal history of peptic ulcer disease: Secondary | ICD-10-CM

## 2016-02-01 DIAGNOSIS — I509 Heart failure, unspecified: Secondary | ICD-10-CM

## 2016-02-01 DIAGNOSIS — J449 Chronic obstructive pulmonary disease, unspecified: Secondary | ICD-10-CM | POA: Diagnosis not present

## 2016-02-01 DIAGNOSIS — J439 Emphysema, unspecified: Secondary | ICD-10-CM

## 2016-02-01 DIAGNOSIS — M48062 Spinal stenosis, lumbar region with neurogenic claudication: Secondary | ICD-10-CM

## 2016-02-01 MED ORDER — SODIUM CHLORIDE 0.9 % IV SOLN
INTRAVENOUS | Status: DC
Start: 2016-02-01 — End: 2016-02-01
  Administered 2016-02-01: 15:00:00 via INTRAVENOUS
  Filled 2016-02-01: qty 1000

## 2016-02-01 MED ORDER — IRON SUCROSE 20 MG/ML IV SOLN
200.0000 mg | Freq: Once | INTRAVENOUS | Status: AC
  Administered 2016-02-01: 200 mg via INTRAVENOUS
  Filled 2016-02-01: qty 10

## 2016-02-01 NOTE — Progress Notes (Signed)
[name]-  Cancer Center  Clinic day:  02/01/16  Chief Complaint: Tanya Rhodes is a 76 y.o. female with a history of iron deficiency anemia who is seen for 3 month assessment.  HPI:  The patient was last seen in the medical oncology clinic on 11/30/2015.  At that time, she felt "ok".  Exam was stable.  Hematocrit had improved to 35.6.  Ferritin was 35.  Labs on 01/25/2016 revealed a hematocrit of 35.2, hemoglobin 12.1, MCV 87.7, platelets 227,000, WBC 4700 with an ANC of 2900.  Ferritin was 29.  During the interim, her legs have been hurting.  She has no energy.  She associates her legs hurting with "low iron".  She has restless legs.  She takes Neuront 600 mg at night.  She ran out of her oral B12.  She has been back on B12 for 2 weeks. For the last 2-3 days, she has felt better.   Past Medical History:  Diagnosis Date  . Allergic rhinitis   . Centrilobular emphysema (HCC)   . Chronic systolic CHF (congestive heart failure) (HCC)   . Colon polyposis   . COPD (chronic obstructive pulmonary disease) (HCC)   . Coronary artery disease involving native coronary artery of native heart without angina pectoris   . DDD (degenerative disc disease), lumbar   . Essential hypertension   . HLD (hyperlipidemia)   . Hypertension   . IDA (iron deficiency anemia)   . Low serum vitamin D   . Lumbar radiculitis   . Lumbar spondylitis (HCC)   . Lumbar stenosis with neurogenic claudication   . Melena   . PUD (peptic ulcer disease)   . Restless leg   . Rotator cuff rupture   . VHD (valvular heart disease)     Past Surgical History:  Procedure Laterality Date  . BREAST BIOPSY Right 11/20/11   neg [place] core  . COLECTOMY    . CORONARY ANGIOPLASTY WITH STENT PLACEMENT      Family History  Problem Relation Age of Onset  . Heart disease Brother   . Heart disease Sister     Social History:  reports that she has quit smoking. She does not have any smokeless  tobacco history on file. She reports that she does not drink alcohol or use drugs.  She has a greater than 30 pack year smoking history.  She quit smoking in 1995.  The patient's son can not bring her to clinic on Tuesdays and Thursdays.  [place]  She is alone today.  Allergies:  Allergies  Allergen Reactions  . Ropinirole Anxiety and Other (See Comments)    Reaction:  Severe leg pain   . Aspirin Other (See Comments)    GIB  . Ferrous Fumarate Itching  . Ferrous Sulfate Diarrhea and Nausea And Vomiting  . Omeprazole Nausea And Vomiting  . Oxycodone Other (See Comments)    Reaction:  Makes pt hyper   . Prednisone Other (See Comments)    Reaction:  Makes pt hyper     Current Medications: Current Outpatient Prescriptions  Medication Sig Dispense Refill  . acetaminophen (TYLENOL) 500 MG tablet Take 500 mg by mouth every 6 (six) hours as needed for mild pain, moderate pain, fever or headache.     . albuterol (PROVENTIL HFA;VENTOLIN HFA) 108 (90 Base) MCG/ACT inhaler Inhale 2 puffs into the lungs every 4 (four) hours as needed for wheezing or shortness of breath.     Marland Kitchen. albuterol (  PROVENTIL) (2.5 MG/3ML) 0.083% nebulizer solution Take 2.5 mg by nebulization every 6 (six) hours as needed for wheezing or shortness of breath.    . ALPRAZolam (XANAX) 0.25 MG tablet Take 0.25 mg by mouth at bedtime as needed for anxiety or sleep.    . budesonide-formoterol (SYMBICORT) 160-4.5 MCG/ACT inhaler Inhale 2 puffs into the lungs 2 (two) times daily.    . cyanocobalamin 1000 MCG tablet Take 1,000 mcg by mouth daily.    Marland Kitchen escitalopram (LEXAPRO) 10 MG tablet Take 10 mg by mouth daily.  0  . fluticasone (FLONASE) 50 MCG/ACT nasal spray Place 2 sprays into both nostrils daily.    . furosemide (LASIX) 20 MG tablet Take 20 mg by mouth daily.    Marland Kitchen gabapentin (NEURONTIN) 300 MG capsule Take 600 mg by mouth at bedtime.    Marland Kitchen lisinopril (PRINIVIL,ZESTRIL) 2.5 MG tablet Take 2.5 mg by mouth daily.     . metoprolol succinate (TOPROL-XL) 25 MG 24 hr tablet Take 25 mg by mouth daily.  1  . ranitidine (ZANTAC) 150 MG tablet Take 150 mg by mouth 2 (two) times daily.    . simvastatin (ZOCOR) 20 MG tablet Take 20 mg by mouth at bedtime.    Marland Kitchen SPIRIVA RESPIMAT 2.5 MCG/ACT AERS Inhale 2 puffs into the lungs daily.   0  . traMADol (ULTRAM) 50 MG tablet Take 75 mg by mouth 2 (two) times daily as needed for moderate pain.     . Vitamin D, Ergocalciferol, (DRISDOL) 50000 units CAPS capsule Take 50,000 Units by mouth every 7 (seven) days. Pt takes on Saturday.     No current facility-administered medications for this visit.     Review of Systems:  GENERAL:  Feels better x 2-3 days.  No fevers or sweats.  Baseline weight 104-107 pounds.  Weight up 2 pounds. PERFORMANCE STATUS (ECOG):  1 HEENT:  No visual changes, runny nose, sore throat, mouth sores or tenderness. Lungs: COPD.  Shortness of breath with exertion.  No cough.  No hemoptysis. On oxygen 2 liters/min. Cardiac:  No chest pain, palpitations, orthopnea, or PND. GI:  Eating well.  No nausea, vomiting, diarrhea, constipation, melena or hematochezia. Hemorrhoids. GU:  No urgency, frequency, dysuria, or hematuria. Musculoskeletal:  Pain in hips down due to spinal stenosis.  No muscle tenderness. Extremities:  Legs hurting.  No swelling. Skin:  No rashes or skin changes. Neuro:  Restless legs.  No headache, numbness or weakness, balance or coordination issues. Endocrine:  No diabetes, thyroid issues, hot flashes or night sweats. Psych:  No mood changes, depression or anxiety. Pain:  No focal pain. Review of systems:  All other systems reviewed and found to be negative.  Physical Exam: Blood pressure 128/61, pulse 86, temperature (!) 96.5 F (35.8 C), resp. rate 18, weight 102 lb 8.2 oz (46.5 kg). GENERAL:  Thin elderly woman sitting comfortably in the exam room in no acute distress. MENTAL STATUS:  Alert and oriented to person, place and  time. HEAD:  Short styled gray hair.  Normocephalic, atraumatic, face symmetric, no Cushingoid features. EYES:  Blue eyes.  Pupils equal round and reactive to light and accomodation.  No conjunctivitis or scleral icterus. ENT:  Oropharynx clear without lesion.  Tongue normal. Mucous membranes moist.  RESPIRATORY:  Clear to auscultation without rales, wheezes or rhonchi. CARDIOVASCULAR:  Regular rate and rhythm without murmur, rub or gallop. ABDOMEN:  Soft, non-tender, with active bowel sounds, and no hepatosplenomegaly.  No masses. SKIN:  No rashes, ulcers  or lesions. EXTREMITIES: No edema, no skin discoloration or tenderness.  No palpable cords. LYMPH NODES: No palpable cervical, supraclavicular, axillary or inguinal adenopathy  NEUROLOGICAL: Unremarkable. PSYCH:  Appropriate.    No visits with results within 3 Day(s) from this visit.  Latest known visit with results is:  Appointment on 01/25/2016  Component Date Value Ref Range Status  . WBC 01/25/2016 4.7  3.6 - 11.0 K/uL Final  . RBC 01/25/2016 4.02  3.80 - 5.20 MIL/uL Final  . Hemoglobin 01/25/2016 12.1  12.0 - 16.0 g/dL Final  . HCT 16/10/9602 35.2  35.0 - 47.0 % Final  . MCV 01/25/2016 87.7  80.0 - 100.0 fL Final  . MCH 01/25/2016 30.1  26.0 - 34.0 pg Final  . MCHC 01/25/2016 34.3  32.0 - 36.0 g/dL Final  . RDW 54/09/8117 13.0  11.5 - 14.5 % Final  . Platelets 01/25/2016 227  150 - 440 K/uL Final  . Neutrophils Relative % 01/25/2016 62  % Final  . Neutro Abs 01/25/2016 2.9  1.4 - 6.5 K/uL Final  . Lymphocytes Relative 01/25/2016 25  % Final  . Lymphs Abs 01/25/2016 1.2  1.0 - 3.6 K/uL Final  . Monocytes Relative 01/25/2016 10  % Final  . Monocytes Absolute 01/25/2016 0.5  0.2 - 0.9 K/uL Final  . Eosinophils Relative 01/25/2016 2  % Final  . Eosinophils Absolute 01/25/2016 0.1  0 - 0.7 K/uL Final  . Basophils Relative 01/25/2016 1  % Final  . Basophils Absolute 01/25/2016 0.0  0 - 0.1 K/uL Final  . Ferritin 01/25/2016 29   11 - 307 ng/mL Final    Assessment:  Tanya Rhodes is a 76 y.o. female with iron deficiency.  She has a history of bleeding gastric ulcer years ago and colonic polyps s/p partial colectomy in 2013.  She required 2 units of PRBCs in 2015.  Colonoscopy on 05/15/2013 revealed a patent ileocolonic anastomosis and internal hemorrhoids.  EGD was normal on 05/15/2013.  Guaiac cards were negative on 09/21/2014 and 05/03/2015.  Diet is modest.  Oral iron causes diarrhea.  She receives Venofer if her ferritin is < 30.  She received Venofer 600 mg (04/26/2015 - 05/17/2015) and 400 mg (10/06/2015- 10/12/2015).  She has a history of B12 deficiency and is on oral B12. Ferritin was 10 (low) on 03/26/2015.  Work-up on 04/19/2015 revealed a hematocrit of 28.3, hemoglobin 8.7, MCV 62.2, platelets 240,000, WBC 5500 with an ANC of 3000.  Ferritin was 5 (low) with an iron saturation of 3% (low) and a TIBC of 539 (high).  B12 and folate were normal.  She has COPD and is on oxygen.  She has a greater than 30 pack year smoking history.  Chest CT angiogram on 04/01/2014 revealed no pulmonary embolism but a 4 mm left upper lobe pulmonary nodule.  Chest CT on 03/05/2015 revealed a stable 3 mm lingular nodule.  There were new ground-glass opacities in the right upper lobe, right middle lobe and right lower lobe suggestive of an infectious or inflammatory process. Chest CT on 07/30/2015 revealed a stable 3 mm LUL nodule (benign).  Symptomatically, she describes aching in her legs which she associates with "low iron".  Exam is stable.  Hematocrit is 35.2.  Ferritin is 29.  Plan: 1.  Review labs from 01/25/2016.  She is not anemic.  RBCs are not microcytic.  Ferritin has dropped below 30.  Discuss Venofer x 1. 2.  Discuss aching in legs.  No swelling to suggest  DVT.  Consider vascular evaluation. 3.  Venofer today 4.  RTC in 3 months for labs (CBC with diff, ferritin) 5.  RTC in 6 months for MD assessment and labs (CBC  with diff, ferritin- day before) +/- Venofer.   Rosey Bath, MD  02/01/2016, 2:09 PM

## 2016-02-06 DIAGNOSIS — J449 Chronic obstructive pulmonary disease, unspecified: Secondary | ICD-10-CM | POA: Diagnosis not present

## 2016-02-29 ENCOUNTER — Other Ambulatory Visit: Payer: PPO

## 2016-02-29 DIAGNOSIS — F411 Generalized anxiety disorder: Secondary | ICD-10-CM | POA: Diagnosis not present

## 2016-02-29 DIAGNOSIS — I10 Essential (primary) hypertension: Secondary | ICD-10-CM | POA: Diagnosis not present

## 2016-02-29 DIAGNOSIS — M5136 Other intervertebral disc degeneration, lumbar region: Secondary | ICD-10-CM | POA: Diagnosis not present

## 2016-02-29 DIAGNOSIS — J432 Centrilobular emphysema: Secondary | ICD-10-CM | POA: Diagnosis not present

## 2016-02-29 DIAGNOSIS — I2510 Atherosclerotic heart disease of native coronary artery without angina pectoris: Secondary | ICD-10-CM | POA: Diagnosis not present

## 2016-02-29 DIAGNOSIS — M25512 Pain in left shoulder: Secondary | ICD-10-CM | POA: Diagnosis not present

## 2016-02-29 DIAGNOSIS — I5022 Chronic systolic (congestive) heart failure: Secondary | ICD-10-CM | POA: Diagnosis not present

## 2016-02-29 DIAGNOSIS — D508 Other iron deficiency anemias: Secondary | ICD-10-CM | POA: Diagnosis not present

## 2016-02-29 DIAGNOSIS — G8929 Other chronic pain: Secondary | ICD-10-CM | POA: Diagnosis not present

## 2016-03-07 DIAGNOSIS — D508 Other iron deficiency anemias: Secondary | ICD-10-CM | POA: Diagnosis not present

## 2016-03-07 DIAGNOSIS — I10 Essential (primary) hypertension: Secondary | ICD-10-CM | POA: Diagnosis not present

## 2016-03-07 DIAGNOSIS — J449 Chronic obstructive pulmonary disease, unspecified: Secondary | ICD-10-CM | POA: Diagnosis not present

## 2016-03-07 DIAGNOSIS — Z1231 Encounter for screening mammogram for malignant neoplasm of breast: Secondary | ICD-10-CM | POA: Diagnosis not present

## 2016-03-07 DIAGNOSIS — Z0000 Encounter for general adult medical examination without abnormal findings: Secondary | ICD-10-CM | POA: Diagnosis not present

## 2016-03-07 DIAGNOSIS — G2581 Restless legs syndrome: Secondary | ICD-10-CM | POA: Diagnosis not present

## 2016-03-07 DIAGNOSIS — I2510 Atherosclerotic heart disease of native coronary artery without angina pectoris: Secondary | ICD-10-CM | POA: Diagnosis not present

## 2016-03-07 DIAGNOSIS — M48062 Spinal stenosis, lumbar region with neurogenic claudication: Secondary | ICD-10-CM | POA: Diagnosis not present

## 2016-03-08 DIAGNOSIS — J449 Chronic obstructive pulmonary disease, unspecified: Secondary | ICD-10-CM | POA: Diagnosis not present

## 2016-03-13 ENCOUNTER — Encounter: Payer: Self-pay | Admitting: Hematology and Oncology

## 2016-03-31 DIAGNOSIS — I5022 Chronic systolic (congestive) heart failure: Secondary | ICD-10-CM | POA: Diagnosis not present

## 2016-03-31 DIAGNOSIS — J309 Allergic rhinitis, unspecified: Secondary | ICD-10-CM | POA: Diagnosis not present

## 2016-03-31 DIAGNOSIS — Z9981 Dependence on supplemental oxygen: Secondary | ICD-10-CM | POA: Diagnosis not present

## 2016-03-31 DIAGNOSIS — J432 Centrilobular emphysema: Secondary | ICD-10-CM | POA: Diagnosis not present

## 2016-03-31 DIAGNOSIS — I2510 Atherosclerotic heart disease of native coronary artery without angina pectoris: Secondary | ICD-10-CM | POA: Diagnosis not present

## 2016-03-31 DIAGNOSIS — I10 Essential (primary) hypertension: Secondary | ICD-10-CM | POA: Diagnosis not present

## 2016-04-05 DIAGNOSIS — J449 Chronic obstructive pulmonary disease, unspecified: Secondary | ICD-10-CM | POA: Diagnosis not present

## 2016-05-01 DIAGNOSIS — R0602 Shortness of breath: Secondary | ICD-10-CM | POA: Diagnosis not present

## 2016-05-01 DIAGNOSIS — J439 Emphysema, unspecified: Secondary | ICD-10-CM | POA: Diagnosis not present

## 2016-05-01 DIAGNOSIS — R918 Other nonspecific abnormal finding of lung field: Secondary | ICD-10-CM | POA: Diagnosis not present

## 2016-05-01 DIAGNOSIS — R0902 Hypoxemia: Secondary | ICD-10-CM | POA: Diagnosis not present

## 2016-05-02 ENCOUNTER — Inpatient Hospital Stay: Payer: PPO | Attending: Hematology and Oncology

## 2016-05-06 DIAGNOSIS — J449 Chronic obstructive pulmonary disease, unspecified: Secondary | ICD-10-CM | POA: Diagnosis not present

## 2016-05-29 ENCOUNTER — Other Ambulatory Visit: Payer: PPO

## 2016-05-30 ENCOUNTER — Ambulatory Visit: Payer: PPO | Admitting: Hematology and Oncology

## 2016-05-30 ENCOUNTER — Ambulatory Visit: Payer: PPO

## 2016-05-30 ENCOUNTER — Other Ambulatory Visit: Payer: PPO

## 2016-06-05 DIAGNOSIS — J449 Chronic obstructive pulmonary disease, unspecified: Secondary | ICD-10-CM | POA: Diagnosis not present

## 2016-06-27 ENCOUNTER — Other Ambulatory Visit: Payer: Self-pay | Admitting: Internal Medicine

## 2016-06-27 DIAGNOSIS — Z1231 Encounter for screening mammogram for malignant neoplasm of breast: Secondary | ICD-10-CM

## 2016-07-04 DIAGNOSIS — M5416 Radiculopathy, lumbar region: Secondary | ICD-10-CM | POA: Diagnosis not present

## 2016-07-04 DIAGNOSIS — E7800 Pure hypercholesterolemia, unspecified: Secondary | ICD-10-CM | POA: Diagnosis not present

## 2016-07-04 DIAGNOSIS — Z23 Encounter for immunization: Secondary | ICD-10-CM | POA: Diagnosis not present

## 2016-07-04 DIAGNOSIS — J9611 Chronic respiratory failure with hypoxia: Secondary | ICD-10-CM | POA: Diagnosis not present

## 2016-07-04 DIAGNOSIS — D508 Other iron deficiency anemias: Secondary | ICD-10-CM | POA: Diagnosis not present

## 2016-07-04 DIAGNOSIS — I10 Essential (primary) hypertension: Secondary | ICD-10-CM | POA: Diagnosis not present

## 2016-07-04 DIAGNOSIS — J432 Centrilobular emphysema: Secondary | ICD-10-CM | POA: Diagnosis not present

## 2016-07-06 DIAGNOSIS — J449 Chronic obstructive pulmonary disease, unspecified: Secondary | ICD-10-CM | POA: Diagnosis not present

## 2016-07-07 DIAGNOSIS — D508 Other iron deficiency anemias: Secondary | ICD-10-CM | POA: Diagnosis not present

## 2016-07-07 DIAGNOSIS — I10 Essential (primary) hypertension: Secondary | ICD-10-CM | POA: Diagnosis not present

## 2016-07-07 DIAGNOSIS — Z23 Encounter for immunization: Secondary | ICD-10-CM | POA: Diagnosis not present

## 2016-07-07 DIAGNOSIS — J9611 Chronic respiratory failure with hypoxia: Secondary | ICD-10-CM | POA: Diagnosis not present

## 2016-07-07 DIAGNOSIS — E7800 Pure hypercholesterolemia, unspecified: Secondary | ICD-10-CM | POA: Diagnosis not present

## 2016-07-07 DIAGNOSIS — J432 Centrilobular emphysema: Secondary | ICD-10-CM | POA: Diagnosis not present

## 2016-07-07 DIAGNOSIS — M5416 Radiculopathy, lumbar region: Secondary | ICD-10-CM | POA: Diagnosis not present

## 2016-07-31 ENCOUNTER — Telehealth: Payer: Self-pay | Admitting: *Deleted

## 2016-07-31 ENCOUNTER — Ambulatory Visit
Admission: RE | Admit: 2016-07-31 | Discharge: 2016-07-31 | Disposition: A | Discharge status: Home / Self Care | Payer: PPO | Source: Ambulatory Visit | Attending: Internal Medicine | Admitting: Internal Medicine

## 2016-07-31 ENCOUNTER — Inpatient Hospital Stay: Payer: PPO | Attending: Hematology and Oncology

## 2016-07-31 DIAGNOSIS — Z8711 Personal history of peptic ulcer disease: Secondary | ICD-10-CM | POA: Diagnosis not present

## 2016-07-31 DIAGNOSIS — M5136 Other intervertebral disc degeneration, lumbar region: Secondary | ICD-10-CM | POA: Diagnosis not present

## 2016-07-31 DIAGNOSIS — I5022 Chronic systolic (congestive) heart failure: Secondary | ICD-10-CM | POA: Insufficient documentation

## 2016-07-31 DIAGNOSIS — E538 Deficiency of other specified B group vitamins: Secondary | ICD-10-CM | POA: Diagnosis not present

## 2016-07-31 DIAGNOSIS — D509 Iron deficiency anemia, unspecified: Secondary | ICD-10-CM | POA: Insufficient documentation

## 2016-07-31 DIAGNOSIS — Z1231 Encounter for screening mammogram for malignant neoplasm of breast: Secondary | ICD-10-CM | POA: Diagnosis not present

## 2016-07-31 DIAGNOSIS — G2581 Restless legs syndrome: Secondary | ICD-10-CM | POA: Diagnosis not present

## 2016-07-31 DIAGNOSIS — Z86010 Personal history of colonic polyps: Secondary | ICD-10-CM | POA: Insufficient documentation

## 2016-07-31 DIAGNOSIS — J449 Chronic obstructive pulmonary disease, unspecified: Secondary | ICD-10-CM | POA: Insufficient documentation

## 2016-07-31 DIAGNOSIS — M48061 Spinal stenosis, lumbar region without neurogenic claudication: Secondary | ICD-10-CM | POA: Insufficient documentation

## 2016-07-31 DIAGNOSIS — Z79899 Other long term (current) drug therapy: Secondary | ICD-10-CM | POA: Insufficient documentation

## 2016-07-31 DIAGNOSIS — I10 Essential (primary) hypertension: Secondary | ICD-10-CM | POA: Insufficient documentation

## 2016-07-31 DIAGNOSIS — Z87891 Personal history of nicotine dependence: Secondary | ICD-10-CM | POA: Diagnosis not present

## 2016-07-31 DIAGNOSIS — J439 Emphysema, unspecified: Secondary | ICD-10-CM | POA: Diagnosis not present

## 2016-07-31 DIAGNOSIS — E785 Hyperlipidemia, unspecified: Secondary | ICD-10-CM | POA: Insufficient documentation

## 2016-07-31 LAB — CBC WITH DIFFERENTIAL/PLATELET
Basophils Absolute: 0.1 10*3/uL (ref 0–0.1)
Basophils Relative: 2 %
Eosinophils Absolute: 0 10*3/uL (ref 0–0.7)
Eosinophils Relative: 1 %
HCT: 37.8 % (ref 35.0–47.0)
Hemoglobin: 12.8 g/dL (ref 12.0–16.0)
Lymphocytes Relative: 33 %
Lymphs Abs: 1.3 10*3/uL (ref 1.0–3.6)
MCH: 29.6 pg (ref 26.0–34.0)
MCHC: 33.9 g/dL (ref 32.0–36.0)
MCV: 87.2 fL (ref 80.0–100.0)
Monocytes Absolute: 0.3 10*3/uL (ref 0.2–0.9)
Monocytes Relative: 8 %
Neutro Abs: 2.3 10*3/uL (ref 1.4–6.5)
Neutrophils Relative %: 56 %
Platelets: 243 10*3/uL (ref 150–440)
RBC: 4.33 MIL/uL (ref 3.80–5.20)
RDW: 13.8 % (ref 11.5–14.5)
WBC: 4.1 10*3/uL (ref 3.6–11.0)

## 2016-07-31 LAB — FERRITIN: Ferritin: 16 ng/mL (ref 11–307)

## 2016-07-31 NOTE — Telephone Encounter (Signed)
-----   Message from Rosey BathMelissa C Corcoran, MD sent at 07/31/2016  2:21 PM EDT ----- Regarding: Please call patient  Ferritin low.  Venofer x 2.  M  ----- Message ----- From: Interface, [facility] [place] Sent: 07/31/2016   1:15 PM To: Rosey BathMelissa C Corcoran, MD

## 2016-07-31 NOTE — Telephone Encounter (Signed)
Attempted to call patient regarding low ferritin.  Got patient's answering machine, however, memory is full so was unable to leave message.  Will try back later.

## 2016-08-01 ENCOUNTER — Encounter: Payer: Self-pay | Admitting: Hematology and Oncology

## 2016-08-01 ENCOUNTER — Other Ambulatory Visit: Payer: PPO

## 2016-08-01 ENCOUNTER — Ambulatory Visit: Payer: PPO | Admitting: Hematology and Oncology

## 2016-08-01 ENCOUNTER — Ambulatory Visit: Payer: PPO

## 2016-08-01 ENCOUNTER — Inpatient Hospital Stay: Payer: PPO

## 2016-08-01 ENCOUNTER — Inpatient Hospital Stay (HOSPITAL_BASED_OUTPATIENT_CLINIC_OR_DEPARTMENT_OTHER): Payer: PPO | Admitting: Hematology and Oncology

## 2016-08-01 VITALS — BP 165/68 | HR 66 | Temp 97.5°F | Resp 18 | Wt 107.5 lb

## 2016-08-01 DIAGNOSIS — I10 Essential (primary) hypertension: Secondary | ICD-10-CM

## 2016-08-01 DIAGNOSIS — Z87891 Personal history of nicotine dependence: Secondary | ICD-10-CM

## 2016-08-01 DIAGNOSIS — I5022 Chronic systolic (congestive) heart failure: Secondary | ICD-10-CM | POA: Diagnosis not present

## 2016-08-01 DIAGNOSIS — J439 Emphysema, unspecified: Secondary | ICD-10-CM

## 2016-08-01 DIAGNOSIS — M5136 Other intervertebral disc degeneration, lumbar region: Secondary | ICD-10-CM

## 2016-08-01 DIAGNOSIS — E785 Hyperlipidemia, unspecified: Secondary | ICD-10-CM | POA: Diagnosis not present

## 2016-08-01 DIAGNOSIS — E538 Deficiency of other specified B group vitamins: Secondary | ICD-10-CM | POA: Diagnosis not present

## 2016-08-01 DIAGNOSIS — Z8711 Personal history of peptic ulcer disease: Secondary | ICD-10-CM | POA: Diagnosis not present

## 2016-08-01 DIAGNOSIS — M48061 Spinal stenosis, lumbar region without neurogenic claudication: Secondary | ICD-10-CM | POA: Diagnosis not present

## 2016-08-01 DIAGNOSIS — D509 Iron deficiency anemia, unspecified: Secondary | ICD-10-CM

## 2016-08-01 DIAGNOSIS — Z86010 Personal history of colonic polyps: Secondary | ICD-10-CM

## 2016-08-01 DIAGNOSIS — Z79899 Other long term (current) drug therapy: Secondary | ICD-10-CM

## 2016-08-01 DIAGNOSIS — G2581 Restless legs syndrome: Secondary | ICD-10-CM | POA: Diagnosis not present

## 2016-08-01 DIAGNOSIS — J449 Chronic obstructive pulmonary disease, unspecified: Secondary | ICD-10-CM | POA: Diagnosis not present

## 2016-08-01 NOTE — Progress Notes (Signed)
Patient here today for MD/Venofer.  Patient's son brought her today and has to be at work @ 1 PM.  Patient would like to reschedule Venofer for tomorrow.

## 2016-08-01 NOTE — Progress Notes (Signed)
[name]  [facility] Center  Clinic day:  08/01/16  Chief Complaint: Tanya Rhodes is a 76 y.o. female with a history of iron deficiency anemia who is seen for 6 month assessment.  HPI:  The patient was last seen in the medical oncology clinic on 02/01/2016.  At that time, she described aching in her legs which she associated with "low iron".  Exam was stable.  Hematocrit was 35.2.  Ferritin was 29.  She received Venofer x 1.  Labs on 07/31/2016 revealed a hematocrit 37.8, hemoglobin 12.8, MCV 87.2, platelets 243,000, white count 4100 with an ANC of 2300. Ferritin was 16.  Symptomatically, she denies any complaints.  She denies any melena, hematochezia, hematuria or vaginal bleeding.   Past Medical History:  Diagnosis Date  . Allergic rhinitis   . Centrilobular emphysema (HCC)   . Chronic systolic CHF (congestive heart failure) (HCC)   . Colon polyposis   . COPD (chronic obstructive pulmonary disease) (HCC)   . Coronary artery disease involving native coronary artery of native heart without angina pectoris   . DDD (degenerative disc disease), lumbar   . Essential hypertension   . HLD (hyperlipidemia)   . Hypertension   . IDA (iron deficiency anemia)   . Low serum vitamin D   . Lumbar radiculitis   . Lumbar spondylitis (HCC)   . Lumbar stenosis with neurogenic claudication   . Melena   . PUD (peptic ulcer disease)   . Restless leg   . Rotator cuff rupture   . VHD (valvular heart disease)     Past Surgical History:  Procedure Laterality Date  . BREAST BIOPSY Right 11/20/11   neg [place] core  . COLECTOMY    . CORONARY ANGIOPLASTY WITH STENT PLACEMENT      Family History  Problem Relation Age of Onset  . Heart disease Brother   . Heart disease Sister     Social History:  reports that she has quit smoking. She has never used smokeless tobacco. She reports that she does not drink alcohol or use drugs.  She has a greater than 30 pack year smoking  history.  She quit smoking in 1995.  The patient's son can not bring her to clinic on Tuesdays and Thursdays.  She lives in [place].    Allergies:  Allergies  Allergen Reactions  . Ropinirole Anxiety and Other (See Comments)    Reaction:  Severe leg pain   . Aspirin Other (See Comments)    GIB  . Ferrous Fumarate Itching  . Ferrous Sulfate Diarrhea and Nausea And Vomiting  . Omeprazole Nausea And Vomiting  . Oxycodone Other (See Comments)    Reaction:  Makes pt hyper   . Prednisone Other (See Comments)    Reaction:  Makes pt hyper     Current Medications: Current Outpatient Prescriptions  Medication Sig Dispense Refill  . acetaminophen (TYLENOL) 500 MG tablet Take 500 mg by mouth every 6 (six) hours as needed for mild pain, moderate pain, fever or headache.     . albuterol (PROVENTIL HFA;VENTOLIN HFA) 108 (90 Base) MCG/ACT inhaler Inhale 2 puffs into the lungs every 4 (four) hours as needed for wheezing or shortness of breath.     Marland Kitchen. albuterol (PROVENTIL) (2.5 MG/3ML) 0.083% nebulizer solution Take 2.5 mg by nebulization every 6 (six) hours as needed for wheezing or shortness of breath.    . ALPRAZolam (XANAX) 0.25 MG tablet Take 0.25 mg by mouth at bedtime as needed for  anxiety or sleep.    . budesonide-formoterol (SYMBICORT) 160-4.5 MCG/ACT inhaler Inhale 2 puffs into the lungs 2 (two) times daily.    . cyanocobalamin 1000 MCG tablet Take 1,000 mcg by mouth daily.    Marland Kitchen escitalopram (LEXAPRO) 10 MG tablet Take 10 mg by mouth daily.  0  . fluticasone (FLONASE) 50 MCG/ACT nasal spray Place 2 sprays into both nostrils daily.    . furosemide (LASIX) 20 MG tablet Take 20 mg by mouth daily.    Marland Kitchen gabapentin (NEURONTIN) 300 MG capsule Take 600 mg by mouth at bedtime.    Marland Kitchen lisinopril (PRINIVIL,ZESTRIL) 2.5 MG tablet Take 2.5 mg by mouth daily.    . metoprolol succinate (TOPROL-XL) 25 MG 24 hr tablet Take 25 mg by mouth daily.  1  . ranitidine (ZANTAC) 150 MG tablet Take 150 mg by mouth 2  (two) times daily.    . simvastatin (ZOCOR) 20 MG tablet Take 20 mg by mouth at bedtime.    Marland Kitchen SPIRIVA RESPIMAT 2.5 MCG/ACT AERS Inhale 2 puffs into the lungs daily.   0  . traMADol (ULTRAM) 50 MG tablet Take 75 mg by mouth 2 (two) times daily as needed for moderate pain.     . Vitamin D, Ergocalciferol, (DRISDOL) 50000 units CAPS capsule Take 50,000 Units by mouth every 7 (seven) days. Pt takes on Saturday.     No current facility-administered medications for this visit.     Review of Systems:  GENERAL:  Feels "ok".  No fevers or sweats.  Baseline weight 104-107 pounds.  Weight up 5 pounds. PERFORMANCE STATUS (ECOG):  1 HEENT:  No visual changes, runny nose, sore throat, mouth sores or tenderness.  Ticking in ear. Lungs: COPD.  Shortness of breath with exertion.  No cough.  No hemoptysis. On oxygen 2 liters/min. Cardiac:  No chest pain, palpitations, orthopnea, or PND. GI:  Eating well.  No nausea, vomiting, diarrhea, constipation, melena or hematochezia. Hemorrhoids. GU:  No urgency, frequency, dysuria, or hematuria. Musculoskeletal:  Pain in hips down due to spinal stenosis.  No muscle tenderness. Extremities:  Legs hurting.  No swelling. Skin:  No rashes or skin changes. Neuro:  Restless legs.  No headache, numbness or weakness, balance or coordination issues. Endocrine:  No diabetes, thyroid issues, hot flashes or night sweats. Psych:  No mood changes, depression or anxiety. Pain:  No focal pain. Review of systems:  All other systems reviewed and found to be negative.  Physical Exam: Blood pressure (!) 165/68, pulse 66, temperature (!) 97.5 F (36.4 C), temperature source Tympanic, resp. rate 18, weight 107 lb 8 oz (48.8 kg). GENERAL:  Thin elderly woman sitting comfortably in the exam room in no acute distress. MENTAL STATUS:  Alert and oriented to person, place and time. HEAD:  Short styled gray hair.  Normocephalic, atraumatic, face symmetric, no Cushingoid features. EYES:   Glasses.  Blue eyes.  Pupils equal round and reactive to light and accomodation.  No conjunctivitis or scleral icterus. ENT:  [place] in place.  Oropharynx clear without lesion.  Tongue normal. Mucous membranes moist.  RESPIRATORY:  Clear to auscultation without rales, wheezes or rhonchi. CARDIOVASCULAR:  Regular rate and rhythm without murmur, rub or gallop. ABDOMEN:  Soft, non-tender, with active bowel sounds, and no hepatosplenomegaly.  No masses. SKIN:  No rashes, ulcers or lesions. EXTREMITIES: No edema, no skin discoloration or tenderness.  No palpable cords. LYMPH NODES: No palpable cervical, supraclavicular, axillary or inguinal adenopathy  NEUROLOGICAL: Unremarkable. PSYCH:  Appropriate.  Appointment on 07/31/2016  Component Date Value Ref Range Status  . WBC 07/31/2016 4.1  3.6 - 11.0 K/uL Final  . RBC 07/31/2016 4.33  3.80 - 5.20 MIL/uL Final  . Hemoglobin 07/31/2016 12.8  12.0 - 16.0 g/dL Final  . HCT 16/10/9602 37.8  35.0 - 47.0 % Final  . MCV 07/31/2016 87.2  80.0 - 100.0 fL Final  . MCH 07/31/2016 29.6  26.0 - 34.0 pg Final  . MCHC 07/31/2016 33.9  32.0 - 36.0 g/dL Final  . RDW 54/09/8117 13.8  11.5 - 14.5 % Final  . Platelets 07/31/2016 243  150 - 440 K/uL Final  . Neutrophils Relative % 07/31/2016 56  % Final  . Neutro Abs 07/31/2016 2.3  1.4 - 6.5 K/uL Final  . Lymphocytes Relative 07/31/2016 33  % Final  . Lymphs Abs 07/31/2016 1.3  1.0 - 3.6 K/uL Final  . Monocytes Relative 07/31/2016 8  % Final  . Monocytes Absolute 07/31/2016 0.3  0.2 - 0.9 K/uL Final  . Eosinophils Relative 07/31/2016 1  % Final  . Eosinophils Absolute 07/31/2016 0.0  0 - 0.7 K/uL Final  . Basophils Relative 07/31/2016 2  % Final  . Basophils Absolute 07/31/2016 0.1  0 - 0.1 K/uL Final  . Ferritin 07/31/2016 16  11 - 307 ng/mL Final    Assessment:  Tanya Rhodes is a 76 y.o. female with iron deficiency.  She has a history of bleeding gastric ulcer years ago and colonic polyps s/p partial  colectomy in 2013.  She required 2 units of PRBCs in 2015.  Colonoscopy on 05/15/2013 revealed a patent ileocolonic anastomosis and internal hemorrhoids.  EGD on 05/15/2013 was normal.  Guaiac cards were negative on 09/21/2014 and 05/03/2015.  Diet is modest.  Oral iron causes diarrhea.  She receives Venofer if her ferritin is < 30.  She received Venofer 600 mg (04/26/2015 - 05/17/2015), 400 mg (10/06/2015- 10/12/2015), and 200 mg (02/01/2016).  Ferritin has been followed: 5 on 04/19/2015, 92 on 05/24/2015, 26 on 08/23/2015, 11 on 09/30/2015, 35 on 11/23/2015, 29 on 01/25/2016, and 16 on 07/31/2016.  She has a history of B12 deficiency and is on oral B12.   Work-up on 04/19/2015 revealed a hematocrit of 28.3, hemoglobin 8.7, MCV 62.2, platelets 240,000, WBC 5500 with an ANC of 3000.  Ferritin was 5 (low) with an iron saturation of 3% (low) and a TIBC of 539 (high).  B12 and folate were normal.  She has COPD and is on oxygen.  She has a greater than 30 pack year smoking history.  Chest CT angiogram on 04/01/2014 revealed no pulmonary embolism but a 4 mm left upper lobe pulmonary nodule.  Chest CT on 03/05/2015 revealed a stable 3 mm lingular nodule.  There were new ground-glass opacities in the right upper lobe, right middle lobe and right lower lobe suggestive of an infectious or inflammatory process. Chest CT on 07/30/2015 revealed a stable 3 mm LUL nodule (benign).  Symptomatically, she describes aching in her legs which she associates with "low iron".  Exam is stable.  Hematocrit is 37.8.  Ferritin is 16.  Plan: 1.  Review labs from 07/31/2016.  She is not anemic.  RBCs are not microcytic.  Ferritin has dropped below 30.  Discuss Venofer x 1. 2.  RTC tomorrow for Venofer. 3.  RTC in 6 months for MD assessment and labs (CBC with diff, ferritin, B12, folate- day before) +/- Venofer.   Rosey Bath, MD  08/01/2016, 12:11 PM

## 2016-08-02 ENCOUNTER — Other Ambulatory Visit: Payer: Self-pay | Admitting: Hematology and Oncology

## 2016-08-02 ENCOUNTER — Inpatient Hospital Stay: Payer: PPO

## 2016-08-02 VITALS — BP 126/64 | HR 73 | Temp 97.7°F | Resp 18

## 2016-08-02 DIAGNOSIS — D509 Iron deficiency anemia, unspecified: Secondary | ICD-10-CM

## 2016-08-02 MED ORDER — SODIUM CHLORIDE 0.9 % IV SOLN
Freq: Once | INTRAVENOUS | Status: AC
  Administered 2016-08-02: 14:00:00 via INTRAVENOUS
  Filled 2016-08-02: qty 1000

## 2016-08-02 MED ORDER — SODIUM CHLORIDE 0.9 % IV SOLN: 200.0000 mg | Freq: Once | INTRAVENOUS | Status: DC

## 2016-08-02 MED ORDER — IRON SUCROSE 20 MG/ML IV SOLN
200.0000 mg | Freq: Once | INTRAVENOUS | Status: AC
  Administered 2016-08-02: 200 mg via INTRAVENOUS
  Filled 2016-08-02: qty 10

## 2016-08-08 ENCOUNTER — Inpatient Hospital Stay: Payer: PPO

## 2016-08-08 VITALS — BP 126/66 | HR 74 | Temp 97.8°F | Resp 18

## 2016-08-08 DIAGNOSIS — D509 Iron deficiency anemia, unspecified: Secondary | ICD-10-CM

## 2016-08-08 MED ORDER — SODIUM CHLORIDE 0.9 % IV SOLN
Freq: Once | INTRAVENOUS | Status: AC
  Administered 2016-08-08: 14:00:00 via INTRAVENOUS
  Filled 2016-08-08: qty 1000

## 2016-08-08 MED ORDER — SODIUM CHLORIDE 0.9 % IV SOLN: 200.0000 mg | Freq: Once | INTRAVENOUS | Status: DC

## 2016-08-08 MED ORDER — IRON SUCROSE 20 MG/ML IV SOLN
200.0000 mg | Freq: Once | INTRAVENOUS | Status: AC
  Administered 2016-08-08: 200 mg via INTRAVENOUS
  Filled 2016-08-08: qty 10

## 2016-09-14 DIAGNOSIS — J449 Chronic obstructive pulmonary disease, unspecified: Secondary | ICD-10-CM | POA: Diagnosis not present

## 2016-09-14 DIAGNOSIS — J439 Emphysema, unspecified: Secondary | ICD-10-CM | POA: Diagnosis not present

## 2016-09-14 DIAGNOSIS — J432 Centrilobular emphysema: Secondary | ICD-10-CM | POA: Diagnosis not present

## 2016-10-14 DIAGNOSIS — J439 Emphysema, unspecified: Secondary | ICD-10-CM | POA: Diagnosis not present

## 2016-10-14 DIAGNOSIS — J432 Centrilobular emphysema: Secondary | ICD-10-CM | POA: Diagnosis not present

## 2016-10-14 DIAGNOSIS — J449 Chronic obstructive pulmonary disease, unspecified: Secondary | ICD-10-CM | POA: Diagnosis not present

## 2016-10-26 DIAGNOSIS — J432 Centrilobular emphysema: Secondary | ICD-10-CM | POA: Diagnosis not present

## 2016-10-26 DIAGNOSIS — J9611 Chronic respiratory failure with hypoxia: Secondary | ICD-10-CM | POA: Diagnosis not present

## 2016-10-26 DIAGNOSIS — E7800 Pure hypercholesterolemia, unspecified: Secondary | ICD-10-CM | POA: Diagnosis not present

## 2016-10-26 DIAGNOSIS — I10 Essential (primary) hypertension: Secondary | ICD-10-CM | POA: Diagnosis not present

## 2016-10-26 DIAGNOSIS — M5416 Radiculopathy, lumbar region: Secondary | ICD-10-CM | POA: Diagnosis not present

## 2016-10-26 DIAGNOSIS — D508 Other iron deficiency anemias: Secondary | ICD-10-CM | POA: Diagnosis not present

## 2016-11-10 DIAGNOSIS — Z9981 Dependence on supplemental oxygen: Secondary | ICD-10-CM | POA: Diagnosis not present

## 2016-11-10 DIAGNOSIS — R918 Other nonspecific abnormal finding of lung field: Secondary | ICD-10-CM | POA: Diagnosis not present

## 2016-11-10 DIAGNOSIS — M5136 Other intervertebral disc degeneration, lumbar region: Secondary | ICD-10-CM | POA: Diagnosis not present

## 2016-11-10 DIAGNOSIS — R0602 Shortness of breath: Secondary | ICD-10-CM | POA: Diagnosis not present

## 2016-11-10 DIAGNOSIS — Z23 Encounter for immunization: Secondary | ICD-10-CM | POA: Diagnosis not present

## 2016-11-10 DIAGNOSIS — I10 Essential (primary) hypertension: Secondary | ICD-10-CM | POA: Diagnosis not present

## 2016-11-10 DIAGNOSIS — J449 Chronic obstructive pulmonary disease, unspecified: Secondary | ICD-10-CM | POA: Diagnosis not present

## 2016-11-10 DIAGNOSIS — J309 Allergic rhinitis, unspecified: Secondary | ICD-10-CM | POA: Diagnosis not present

## 2016-11-11 DIAGNOSIS — R918 Other nonspecific abnormal finding of lung field: Secondary | ICD-10-CM | POA: Diagnosis not present

## 2016-11-14 DIAGNOSIS — J449 Chronic obstructive pulmonary disease, unspecified: Secondary | ICD-10-CM | POA: Diagnosis not present

## 2016-11-14 DIAGNOSIS — J432 Centrilobular emphysema: Secondary | ICD-10-CM | POA: Diagnosis not present

## 2016-11-14 DIAGNOSIS — J439 Emphysema, unspecified: Secondary | ICD-10-CM | POA: Diagnosis not present

## 2016-12-04 DIAGNOSIS — I10 Essential (primary) hypertension: Secondary | ICD-10-CM | POA: Diagnosis not present

## 2016-12-04 DIAGNOSIS — I5022 Chronic systolic (congestive) heart failure: Secondary | ICD-10-CM | POA: Diagnosis not present

## 2016-12-04 DIAGNOSIS — J441 Chronic obstructive pulmonary disease with (acute) exacerbation: Secondary | ICD-10-CM | POA: Diagnosis not present

## 2016-12-04 DIAGNOSIS — E785 Hyperlipidemia, unspecified: Secondary | ICD-10-CM | POA: Diagnosis not present

## 2016-12-04 DIAGNOSIS — R0609 Other forms of dyspnea: Secondary | ICD-10-CM | POA: Diagnosis not present

## 2016-12-04 DIAGNOSIS — I420 Dilated cardiomyopathy: Secondary | ICD-10-CM | POA: Diagnosis not present

## 2016-12-04 DIAGNOSIS — I2510 Atherosclerotic heart disease of native coronary artery without angina pectoris: Secondary | ICD-10-CM | POA: Diagnosis not present

## 2016-12-12 DIAGNOSIS — I5022 Chronic systolic (congestive) heart failure: Secondary | ICD-10-CM | POA: Diagnosis not present

## 2016-12-12 DIAGNOSIS — I2510 Atherosclerotic heart disease of native coronary artery without angina pectoris: Secondary | ICD-10-CM | POA: Diagnosis not present

## 2016-12-12 DIAGNOSIS — I420 Dilated cardiomyopathy: Secondary | ICD-10-CM | POA: Diagnosis not present

## 2016-12-12 DIAGNOSIS — I10 Essential (primary) hypertension: Secondary | ICD-10-CM | POA: Diagnosis not present

## 2016-12-14 DIAGNOSIS — J432 Centrilobular emphysema: Secondary | ICD-10-CM | POA: Diagnosis not present

## 2016-12-14 DIAGNOSIS — J439 Emphysema, unspecified: Secondary | ICD-10-CM | POA: Diagnosis not present

## 2016-12-14 DIAGNOSIS — J449 Chronic obstructive pulmonary disease, unspecified: Secondary | ICD-10-CM | POA: Diagnosis not present

## 2017-01-14 DIAGNOSIS — J439 Emphysema, unspecified: Secondary | ICD-10-CM | POA: Diagnosis not present

## 2017-01-14 DIAGNOSIS — J449 Chronic obstructive pulmonary disease, unspecified: Secondary | ICD-10-CM | POA: Diagnosis not present

## 2017-01-14 DIAGNOSIS — J432 Centrilobular emphysema: Secondary | ICD-10-CM | POA: Diagnosis not present

## 2017-01-31 ENCOUNTER — Inpatient Hospital Stay: Payer: PPO | Attending: Hematology and Oncology

## 2017-02-01 ENCOUNTER — Inpatient Hospital Stay: Payer: PPO

## 2017-02-01 ENCOUNTER — Inpatient Hospital Stay: Payer: PPO | Admitting: Hematology and Oncology

## 2017-02-01 NOTE — Progress Notes (Deleted)
[name] Center  Clinic day:  02/01/17  Chief Complaint: Tanya Rhodes is a 77 y.o. female with a history of iron deficiency anemia who is seen for 6 month assessment.  HPI:  The patient was last seen in the medical oncology clinic on 08/01/2016.  At that time, she described aching in her legs which she associated with "low iron".  Exam was stable.  Hematocrit was 37.8.  MCV was 87.2.  Ferritin was 16.  She received Venofer x2 (08/02/2016 and 08/08/2016).   During the interim,   Past Medical History:  Diagnosis Date  . Allergic rhinitis   . Centrilobular emphysema (HCC)   . Chronic systolic CHF (congestive heart failure) (HCC)   . Colon polyposis   . COPD (chronic obstructive pulmonary disease) (HCC)   . Coronary artery disease involving native coronary artery of native heart without angina pectoris   . DDD (degenerative disc disease), lumbar   . Essential hypertension   . HLD (hyperlipidemia)   . Hypertension   . IDA (iron deficiency anemia)   . Low serum vitamin D   . Lumbar radiculitis   . Lumbar spondylitis (HCC)   . Lumbar stenosis with neurogenic claudication   . Melena   . PUD (peptic ulcer disease)   . Restless leg   . Rotator cuff rupture   . VHD (valvular heart disease)     Past Surgical History:  Procedure Laterality Date  . BREAST BIOPSY Right 11/20/11   neg [place] core  . COLECTOMY    . CORONARY ANGIOPLASTY WITH STENT PLACEMENT      Family History  Problem Relation Age of Onset  . Heart disease Brother   . Heart disease Sister     Social History:  reports that she has quit smoking. she has never used smokeless tobacco. She reports that she does not drink alcohol or use drugs.  She has a greater than 30 pack year smoking history.  She quit smoking in 1995.  The patient's son can not bring her to clinic on Tuesdays and Thursdays.  She lives in [place].    Allergies:  Allergies  Allergen Reactions  . Ropinirole  Anxiety and Other (See Comments)    Reaction:  Severe leg pain   . Aspirin Other (See Comments)    GIB  . Ferrous Fumarate Itching  . Ferrous Sulfate Diarrhea and Nausea And Vomiting  . Omeprazole Nausea And Vomiting  . Oxycodone Other (See Comments)    Reaction:  Makes pt hyper   . Prednisone Other (See Comments)    Reaction:  Makes pt hyper     Current Medications: Current Outpatient Medications  Medication Sig Dispense Refill  . acetaminophen (TYLENOL) 500 MG tablet Take 500 mg by mouth every 6 (six) hours as needed for mild pain, moderate pain, fever or headache.     . albuterol (PROVENTIL HFA;VENTOLIN HFA) 108 (90 Base) MCG/ACT inhaler Inhale 2 puffs into the lungs every 4 (four) hours as needed for wheezing or shortness of breath.     Marland Kitchen albuterol (PROVENTIL) (2.5 MG/3ML) 0.083% nebulizer solution Take 2.5 mg by nebulization every 6 (six) hours as needed for wheezing or shortness of breath.    . ALPRAZolam (XANAX) 0.25 MG tablet Take 0.25 mg by mouth at bedtime as needed for anxiety or sleep.    . budesonide-formoterol (SYMBICORT) 160-4.5 MCG/ACT inhaler Inhale 2 puffs into the lungs 2 (two) times daily.    . cyanocobalamin 1000 MCG tablet  Take 1,000 mcg by mouth daily.    Marland Kitchen. escitalopram (LEXAPRO) 10 MG tablet Take 10 mg by mouth daily.  0  . fluticasone (FLONASE) 50 MCG/ACT nasal spray Place 2 sprays into both nostrils daily.    . furosemide (LASIX) 20 MG tablet Take 20 mg by mouth daily.    Marland Kitchen. gabapentin (NEURONTIN) 300 MG capsule Take 600 mg by mouth at bedtime.    Marland Kitchen. lisinopril (PRINIVIL,ZESTRIL) 2.5 MG tablet Take 2.5 mg by mouth daily.    . metoprolol succinate (TOPROL-XL) 25 MG 24 hr tablet Take 25 mg by mouth daily.  1  . ranitidine (ZANTAC) 150 MG tablet Take 150 mg by mouth 2 (two) times daily.    . simvastatin (ZOCOR) 20 MG tablet Take 20 mg by mouth at bedtime.    Marland Kitchen. SPIRIVA RESPIMAT 2.5 MCG/ACT AERS Inhale 2 puffs into the lungs daily.   0  . traMADol (ULTRAM) 50 MG  tablet Take 75 mg by mouth 2 (two) times daily as needed for moderate pain.     . Vitamin D, Ergocalciferol, (DRISDOL) 50000 units CAPS capsule Take 50,000 Units by mouth every 7 (seven) days. Pt takes on Saturday.     No current facility-administered medications for this visit.     Review of Systems:  GENERAL:  Feels "ok".  No fevers or sweats.  Baseline weight 104-107 pounds.  Weight up 5 pounds. PERFORMANCE STATUS (ECOG):  1 HEENT:  No visual changes, runny nose, sore throat, mouth sores or tenderness.  Ticking in ear. Lungs: COPD.  Shortness of breath with exertion.  No cough.  No hemoptysis. On oxygen 2 liters/min. Cardiac:  No chest pain, palpitations, orthopnea, or PND. GI:  Eating well.  No nausea, vomiting, diarrhea, constipation, melena or hematochezia. Hemorrhoids. GU:  No urgency, frequency, dysuria, or hematuria. Musculoskeletal:  Pain in hips down due to spinal stenosis.  No muscle tenderness. Extremities:  Legs hurting.  No swelling. Skin:  No rashes or skin changes. Neuro:  Restless legs.  No headache, numbness or weakness, balance or coordination issues. Endocrine:  No diabetes, thyroid issues, hot flashes or night sweats. Psych:  No mood changes, depression or anxiety. Pain:  No focal pain. Review of systems:  All other systems reviewed and found to be negative.  Physical Exam: There were no vitals taken for this visit. GENERAL:  Thin elderly woman sitting comfortably in the exam room in no acute distress. MENTAL STATUS:  Alert and oriented to person, place and time. HEAD:  Short styled gray hair.  Normocephalic, atraumatic, face symmetric, no Cushingoid features. EYES:  Glasses.  Blue eyes.  Pupils equal round and reactive to light and accomodation.  No conjunctivitis or scleral icterus. ENT:  [place] in place.  Oropharynx clear without lesion.  Tongue normal. Mucous membranes moist.  RESPIRATORY:  Clear to auscultation without rales, wheezes or rhonchi. CARDIOVASCULAR:   Regular rate and rhythm without murmur, rub or gallop. ABDOMEN:  Soft, non-tender, with active bowel sounds, and no hepatosplenomegaly.  No masses. SKIN:  No rashes, ulcers or lesions. EXTREMITIES: No edema, no skin discoloration or tenderness.  No palpable cords. LYMPH NODES: No palpable cervical, supraclavicular, axillary or inguinal adenopathy  NEUROLOGICAL: Unremarkable. PSYCH:  Appropriate.    No visits with results within 3 Day(s) from this visit.  Latest known visit with results is:  Appointment on 07/31/2016  Component Date Value Ref Range Status  . WBC 07/31/2016 4.1  3.6 - 11.0 K/uL Final  . RBC 07/31/2016 4.33  3.80 - 5.20 MIL/uL Final  . Hemoglobin 07/31/2016 12.8  12.0 - 16.0 g/dL Final  . HCT 96/04/5407 37.8  35.0 - 47.0 % Final  . MCV 07/31/2016 87.2  80.0 - 100.0 fL Final  . MCH 07/31/2016 29.6  26.0 - 34.0 pg Final  . MCHC 07/31/2016 33.9  32.0 - 36.0 g/dL Final  . RDW 81/19/1478 13.8  11.5 - 14.5 % Final  . Platelets 07/31/2016 243  150 - 440 K/uL Final  . Neutrophils Relative % 07/31/2016 56  % Final  . Neutro Abs 07/31/2016 2.3  1.4 - 6.5 K/uL Final  . Lymphocytes Relative 07/31/2016 33  % Final  . Lymphs Abs 07/31/2016 1.3  1.0 - 3.6 K/uL Final  . Monocytes Relative 07/31/2016 8  % Final  . Monocytes Absolute 07/31/2016 0.3  0.2 - 0.9 K/uL Final  . Eosinophils Relative 07/31/2016 1  % Final  . Eosinophils Absolute 07/31/2016 0.0  0 - 0.7 K/uL Final  . Basophils Relative 07/31/2016 2  % Final  . Basophils Absolute 07/31/2016 0.1  0 - 0.1 K/uL Final  . Ferritin 07/31/2016 16  11 - 307 ng/mL Final    Assessment:  Tanya Rhodes is a 77 y.o. female with iron deficiency.  She has a history of bleeding gastric ulcer years ago and colonic polyps s/p partial colectomy in 2013.  She required 2 units of PRBCs in 2015.  Colonoscopy on 05/15/2013 revealed a patent ileocolonic anastomosis and internal hemorrhoids.  EGD on 05/15/2013 was normal.  Guaiac cards were  negative on 09/21/2014 and 05/03/2015.  Diet is modest.  Oral iron causes diarrhea.  She receives Venofer if her ferritin is < 30.  She received Venofer 600 mg (04/26/2015 - 05/17/2015), 400 mg (10/06/2015- 10/12/2015), 200 mg (02/01/2016), and 400 mg (08/02/2016 - 08/08/2016).  Ferritin has been followed: 5 on 04/19/2015, 92 on 05/24/2015, 26 on 08/23/2015, 11 on 09/30/2015, 35 on 11/23/2015, 29 on 01/25/2016, and 16 on 07/31/2016.  She has a history of B12 deficiency and is on oral B12.   Work-up on 04/19/2015 revealed a hematocrit of 28.3, hemoglobin 8.7, MCV 62.2, platelets 240,000, WBC 5500 with an ANC of 3000.  Ferritin was 5 (low) with an iron saturation of 3% (low) and a TIBC of 539 (high).  B12 and folate were normal.  She has COPD and is on oxygen.  She has a greater than 30 pack year smoking history.  Chest CT angiogram on 04/01/2014 revealed no pulmonary embolism but a 4 mm left upper lobe pulmonary nodule.  Chest CT on 03/05/2015 revealed a stable 3 mm lingular nodule.  There were new ground-glass opacities in the right upper lobe, right middle lobe and right lower lobe suggestive of an infectious or inflammatory process. Chest CT on 07/30/2015 revealed a stable 3 mm LUL nodule (benign).  Symptomatically, she describes aching in her legs which she associates with "low iron".  Exam is stable.  Hematocrit is 37.8.  Ferritin is 16.  Plan: 1.  Labs today:  CBC with diff, ferritin, B12, folate. 2.  Discuss follow-up with GI [facility] secondary to recurrent iron deficiency.  3.  RTC in 6 months for MD assessment and labs (CBC with diff, ferritin, B12, folate- day before) +/- Venofer.   Rosey Bath, MD  02/01/2017, 5:07 AM   I saw and evaluated the patient, participating in the key portions of the service and reviewing pertinent diagnostic studies and records.  I reviewed the nurse practitioner's note  and agree with the findings and the plan.  The assessment and plan were  discussed with the patient.  Additional diagnostic studies of *** are needed to clarify *** and would change the clinical management.  A few ***multiple questions were asked by the patient and answered.   Nelva Nay, MD 02/01/2017,5:07 AM

## 2017-02-14 DIAGNOSIS — J439 Emphysema, unspecified: Secondary | ICD-10-CM | POA: Diagnosis not present

## 2017-02-14 DIAGNOSIS — J432 Centrilobular emphysema: Secondary | ICD-10-CM | POA: Diagnosis not present

## 2017-02-14 DIAGNOSIS — J449 Chronic obstructive pulmonary disease, unspecified: Secondary | ICD-10-CM | POA: Diagnosis not present

## 2017-03-01 ENCOUNTER — Inpatient Hospital Stay (HOSPITAL_BASED_OUTPATIENT_CLINIC_OR_DEPARTMENT_OTHER): Payer: PPO | Admitting: Hematology and Oncology

## 2017-03-01 ENCOUNTER — Inpatient Hospital Stay: Payer: PPO

## 2017-03-01 ENCOUNTER — Inpatient Hospital Stay: Payer: PPO | Attending: Hematology and Oncology

## 2017-03-01 ENCOUNTER — Encounter: Payer: Self-pay | Admitting: Hematology and Oncology

## 2017-03-01 ENCOUNTER — Other Ambulatory Visit: Payer: Self-pay | Admitting: Hematology and Oncology

## 2017-03-01 VITALS — BP 119/65 | HR 76 | Temp 96.9°F | Resp 20 | Wt 105.5 lb

## 2017-03-01 DIAGNOSIS — M48062 Spinal stenosis, lumbar region with neurogenic claudication: Secondary | ICD-10-CM

## 2017-03-01 DIAGNOSIS — I10 Essential (primary) hypertension: Secondary | ICD-10-CM | POA: Diagnosis not present

## 2017-03-01 DIAGNOSIS — Z8711 Personal history of peptic ulcer disease: Secondary | ICD-10-CM | POA: Diagnosis not present

## 2017-03-01 DIAGNOSIS — Z79899 Other long term (current) drug therapy: Secondary | ICD-10-CM | POA: Insufficient documentation

## 2017-03-01 DIAGNOSIS — Z9049 Acquired absence of other specified parts of digestive tract: Secondary | ICD-10-CM | POA: Diagnosis not present

## 2017-03-01 DIAGNOSIS — J432 Centrilobular emphysema: Secondary | ICD-10-CM | POA: Insufficient documentation

## 2017-03-01 DIAGNOSIS — E785 Hyperlipidemia, unspecified: Secondary | ICD-10-CM | POA: Diagnosis not present

## 2017-03-01 DIAGNOSIS — D509 Iron deficiency anemia, unspecified: Secondary | ICD-10-CM | POA: Diagnosis not present

## 2017-03-01 DIAGNOSIS — J449 Chronic obstructive pulmonary disease, unspecified: Secondary | ICD-10-CM

## 2017-03-01 DIAGNOSIS — M5416 Radiculopathy, lumbar region: Secondary | ICD-10-CM | POA: Insufficient documentation

## 2017-03-01 DIAGNOSIS — E538 Deficiency of other specified B group vitamins: Secondary | ICD-10-CM

## 2017-03-01 DIAGNOSIS — I2510 Atherosclerotic heart disease of native coronary artery without angina pectoris: Secondary | ICD-10-CM

## 2017-03-01 DIAGNOSIS — I5022 Chronic systolic (congestive) heart failure: Secondary | ICD-10-CM | POA: Diagnosis not present

## 2017-03-01 DIAGNOSIS — G2581 Restless legs syndrome: Secondary | ICD-10-CM | POA: Insufficient documentation

## 2017-03-01 DIAGNOSIS — M5136 Other intervertebral disc degeneration, lumbar region: Secondary | ICD-10-CM | POA: Insufficient documentation

## 2017-03-01 DIAGNOSIS — Z86010 Personal history of colonic polyps: Secondary | ICD-10-CM | POA: Diagnosis not present

## 2017-03-01 DIAGNOSIS — Z87891 Personal history of nicotine dependence: Secondary | ICD-10-CM

## 2017-03-01 LAB — FOLATE: Folate: 19 ng/mL (ref >5.9)

## 2017-03-01 LAB — CBC WITH DIFFERENTIAL/PLATELET
Basophils Absolute: 0 10*3/uL (ref 0–0.1)
Basophils Relative: 1 %
Eosinophils Absolute: 0.2 10*3/uL (ref 0–0.7)
Eosinophils Relative: 3 %
HCT: 37 % (ref 35.0–47.0)
Hemoglobin: 12.3 g/dL (ref 12.0–16.0)
Lymphocytes Relative: 46 %
Lymphs Abs: 2.2 10*3/uL (ref 1.0–3.6)
MCH: 29.1 pg (ref 26.0–34.0)
MCHC: 33.3 g/dL (ref 32.0–36.0)
MCV: 87.4 fL (ref 80.0–100.0)
Monocytes Absolute: 0.6 10*3/uL (ref 0.2–0.9)
Monocytes Relative: 11 %
Neutro Abs: 1.9 10*3/uL (ref 1.4–6.5)
Neutrophils Relative %: 39 %
Platelets: 219 10*3/uL (ref 150–440)
RBC: 4.24 MIL/uL (ref 3.80–5.20)
RDW: 13.8 % (ref 11.5–14.5)
WBC: 4.9 10*3/uL (ref 3.6–11.0)

## 2017-03-01 LAB — IRON AND TIBC
Iron: 67 ug/dL (ref 28–170)
Saturation Ratios: 19 % (ref 10.4–31.8)
TIBC: 349 ug/dL (ref 250–450)
UIBC: 282 ug/dL

## 2017-03-01 LAB — FERRITIN: Ferritin: 48 ng/mL (ref 11–307)

## 2017-03-01 LAB — VITAMIN B12: Vitamin B-12: 1175 pg/mL — ABNORMAL HIGH (ref 180–914)

## 2017-03-01 NOTE — Progress Notes (Signed)
[name]-  Cancer Center  Clinic day:  03/01/17  Chief Complaint: Tanya Rhodes is a 77 y.o. female with a history of iron deficiency anemia who is seen for 7 month assessment.  HPI:  The patient was last seen in the medical oncology clinic on 08/01/2016.  At that time, she described aching in her legs which she associated with "low iron".  Exam was stable.  Hematocrit was 37.8.  MCV was 87.2.  Ferritin was 16.  She received Venofer x2 (08/02/2016 and 08/08/2016).   Patient was to follow up with [facility] Amedeo Kinsman, NP) for persistent IDA despite intravenous iron replacement.   During the interim, patient has been doing "pretty good". She continues to report chronic in her lower legs. She has some restless leg symptoms. She denies ice pica. Patient has chronic shortness of breath. She is on supplemental oxygen at 2L/[place]. Patient denies bleeding; no hematochezia, melena, or vaginal bleeding.  Patient is eating "ok". Her weight has decreased 2 pounds. She denies pain in the clinic today.    Past Medical History:  Diagnosis Date  . Allergic rhinitis   . Centrilobular emphysema (HCC)   . Chronic systolic CHF (congestive heart failure) (HCC)   . Colon polyposis   . COPD (chronic obstructive pulmonary disease) (HCC)   . Coronary artery disease involving native coronary artery of native heart without angina pectoris   . DDD (degenerative disc disease), lumbar   . Essential hypertension   . HLD (hyperlipidemia)   . Hypertension   . IDA (iron deficiency anemia)   . Low serum vitamin D   . Lumbar radiculitis   . Lumbar spondylitis (HCC)   . Lumbar stenosis with neurogenic claudication   . Melena   . PUD (peptic ulcer disease)   . Restless leg   . Rotator cuff rupture   . VHD (valvular heart disease)     Past Surgical History:  Procedure Laterality Date  . BREAST BIOPSY Right 11/20/11   neg [place] core  . COLECTOMY    . CORONARY ANGIOPLASTY  WITH STENT PLACEMENT      Family History  Problem Relation Age of Onset  . Heart disease Brother   . Heart disease Sister     Social History:  reports that she has quit smoking. she has never used smokeless tobacco. She reports that she does not drink alcohol or use drugs.  She has a greater than 30 pack year smoking history.  She quit smoking in 1995.  The patient's son can not bring her to clinic on Tuesdays and Thursdays.  She lives in [place].    Allergies:  Allergies  Allergen Reactions  . Ropinirole Anxiety and Other (See Comments)    Reaction:  Severe leg pain   . Aspirin Other (See Comments)    GIB  . Ferrous Fumarate Itching  . Ferrous Sulfate Diarrhea and Nausea And Vomiting  . Omeprazole Nausea And Vomiting  . Oxycodone Other (See Comments)    Reaction:  Makes pt hyper   . Prednisone Other (See Comments)    Reaction:  Makes pt hyper     Current Medications: Current Outpatient Medications  Medication Sig Dispense Refill  . acetaminophen (TYLENOL) 500 MG tablet Take 500 mg by mouth every 6 (six) hours as needed for mild pain, moderate pain, fever or headache.     . albuterol (PROVENTIL HFA;VENTOLIN HFA) 108 (90 Base) MCG/ACT inhaler Inhale 2 puffs into the lungs every 4 (four)  hours as needed for wheezing or shortness of breath.     Marland Kitchen albuterol (PROVENTIL) (2.5 MG/3ML) 0.083% nebulizer solution Take 2.5 mg by nebulization every 6 (six) hours as needed for wheezing or shortness of breath.    . ALPRAZolam (XANAX) 0.25 MG tablet Take 0.25 mg by mouth at bedtime as needed for anxiety or sleep.    . budesonide-formoterol (SYMBICORT) 160-4.5 MCG/ACT inhaler Inhale 2 puffs into the lungs 2 (two) times daily.    . cyanocobalamin 1000 MCG tablet Take 1,000 mcg by mouth daily.    Marland Kitchen escitalopram (LEXAPRO) 10 MG tablet Take 10 mg by mouth daily.  0  . fluticasone (FLONASE) 50 MCG/ACT nasal spray Place 2 sprays into both nostrils daily.    . furosemide (LASIX) 20 MG tablet Take  20 mg by mouth daily.    Marland Kitchen gabapentin (NEURONTIN) 300 MG capsule Take 600 mg by mouth at bedtime.    Marland Kitchen lisinopril (PRINIVIL,ZESTRIL) 2.5 MG tablet Take 2.5 mg by mouth daily.    . metoprolol succinate (TOPROL-XL) 25 MG 24 hr tablet Take 25 mg by mouth daily.  1  . ranitidine (ZANTAC) 150 MG tablet Take 150 mg by mouth 2 (two) times daily.    . simvastatin (ZOCOR) 20 MG tablet Take 20 mg by mouth at bedtime.    Marland Kitchen SPIRIVA RESPIMAT 2.5 MCG/ACT AERS Inhale 2 puffs into the lungs daily.   0  . traMADol (ULTRAM) 50 MG tablet Take 75 mg by mouth 2 (two) times daily as needed for moderate pain.     . Vitamin D, Ergocalciferol, (DRISDOL) 50000 units CAPS capsule Take 50,000 Units by mouth every 7 (seven) days. Pt takes on Saturday.     No current facility-administered medications for this visit.     Review of Systems:  GENERAL:  Feels "pretty good".  No fevers or sweats.  Baseline weight 104-107 pounds.  Weight down 2  pounds. PERFORMANCE STATUS (ECOG):  1 HEENT:  No visual changes, runny nose, sore throat, mouth sores or tenderness.  Ticking in ear. Lungs: COPD.  Chronic shortness of breath with exertion.  No cough.  No hemoptysis. On oxygen 2 liters/min. Cardiac:  No chest pain, palpitations, orthopnea, or PND. GI:  Eating well.  No nausea, vomiting, diarrhea, constipation, melena or hematochezia. Hemorrhoids. GU:  No urgency, frequency, dysuria, or hematuria. Musculoskeletal:  Pain in hips down due to spinal stenosis.  No muscle tenderness. Extremities:  Legs hurting.  No swelling. Skin:  No rashes or skin changes. Neuro:  Restless legs.  No headache, numbness or weakness, balance or coordination issues. Endocrine:  No diabetes, thyroid issues, hot flashes or night sweats. Psych:  No mood changes, depression or anxiety. Pain:  No focal pain. Review of systems:  All other systems reviewed and found to be negative.  Physical Exam: Blood pressure 119/65, pulse 76, temperature (!) 96.9 F (36.1  C), temperature source Tympanic, resp. rate 20, weight 105 lb 8 oz (47.9 kg). GENERAL:  Thin elderly woman sitting comfortably in the exam room in no acute distress. MENTAL STATUS:  Alert and oriented to person, place and time. HEAD:  Short styled gray hair.  Normocephalic, atraumatic, face symmetric, no Cushingoid features. EYES:  Glasses.  Blue eyes.  Pupils equal round and reactive to light and accomodation.  No conjunctivitis or scleral icterus. ENT:  [place] in place.  Oropharynx clear without lesion.  Tongue normal. Mucous membranes moist.  RESPIRATORY:  Clear to auscultation without rales, wheezes or rhonchi. CARDIOVASCULAR:  Regular rate and rhythm without murmur, rub or gallop. ABDOMEN:  Soft, non-tender, with active bowel sounds, and no hepatosplenomegaly.  No masses. SKIN:  No rashes, ulcers or lesions. EXTREMITIES: No edema, no skin discoloration or tenderness.  No palpable cords. LYMPH NODES: No palpable cervical, supraclavicular, axillary or inguinal adenopathy  NEUROLOGICAL: Unremarkable. PSYCH:  Appropriate.    Appointment on 03/01/2017  Component Date Value Ref Range Status  . WBC 03/01/2017 4.9  3.6 - 11.0 K/uL Final  . RBC 03/01/2017 4.24  3.80 - 5.20 MIL/uL Final  . Hemoglobin 03/01/2017 12.3  12.0 - 16.0 g/dL Final  . HCT 16/10/9602 37.0  35.0 - 47.0 % Final  . MCV 03/01/2017 87.4  80.0 - 100.0 fL Final  . MCH 03/01/2017 29.1  26.0 - 34.0 pg Final  . MCHC 03/01/2017 33.3  32.0 - 36.0 g/dL Final  . RDW 54/09/8117 13.8  11.5 - 14.5 % Final  . Platelets 03/01/2017 219  150 - 440 K/uL Final  . Neutrophils Relative % 03/01/2017 39  % Final  . Neutro Abs 03/01/2017 1.9  1.4 - 6.5 K/uL Final  . Lymphocytes Relative 03/01/2017 46  % Final  . Lymphs Abs 03/01/2017 2.2  1.0 - 3.6 K/uL Final  . Monocytes Relative 03/01/2017 11  % Final  . Monocytes Absolute 03/01/2017 0.6  0.2 - 0.9 K/uL Final  . Eosinophils Relative 03/01/2017 3  % Final  . Eosinophils Absolute 03/01/2017 0.2   0 - 0.7 K/uL Final  . Basophils Relative 03/01/2017 1  % Final  . Basophils Absolute 03/01/2017 0.0  0 - 0.1 K/uL Final   Performed at [facility], [address], [place], [place] [address]    Assessment:  Tanya Rhodes is a 77 y.o. female with iron deficiency.  She has a history of bleeding gastric ulcer years ago and colonic polyps s/p partial colectomy in 2013.  She required 2 units of PRBCs in 2015.  Colonoscopy on 05/15/2013 revealed a patent ileocolonic anastomosis and internal hemorrhoids.  EGD on 05/15/2013 was normal.  Guaiac cards were negative on 09/21/2014 and 05/03/2015.  Diet is modest.  Oral iron causes diarrhea.  She receives Venofer if her ferritin is < 30.  She received Venofer 600 mg (04/26/2015 - 05/17/2015), 400 mg (10/06/2015- 10/12/2015), 200 mg (02/01/2016), and 400 mg (08/02/2016 - 08/08/2016).  Ferritin has been followed: 5 on 04/19/2015, 92 on 05/24/2015, 26 on 08/23/2015, 11 on 09/30/2015, 35 on 11/23/2015, 29 on 01/25/2016, 16 on 07/31/2016, and 48 on 03/01/2017.  She has a history of B12 deficiency and is on oral B12.  B12 was 1175 on 03/01/2017.  Folate was normal on 03/01/2017.  Work-up on 04/19/2015 revealed a hematocrit of 28.3, hemoglobin 8.7, MCV 62.2, platelets 240,000, WBC 5500 with an ANC of 3000.  Ferritin was 5 (low) with an iron saturation of 3% (low) and a TIBC of 539 (high).  B12 and folate were normal.  She has COPD and is on oxygen.  She has a greater than 30 pack year smoking history.  Chest CT angiogram on 04/01/2014 revealed no pulmonary embolism but a 4 mm left upper lobe pulmonary nodule.  Chest CT on 03/05/2015 revealed a stable 3 mm lingular nodule.  There were new ground-glass opacities in the right upper lobe, right middle lobe and right lower lobe suggestive of an infectious or inflammatory process. Chest CT on 07/30/2015 revealed a stable 3 mm LUL nodule (benign).  Symptomatically, she has aching in her legs which  she associates  with "low iron".  Exam is stable.  Hematocrit is 37.0.  Ferritin is 48.   Plan: 1.  Labs today:  CBC with diff, ferritin, iron studies, B12, folate. 2.  D[facility] to recurrent iron deficiency. 3.  Discuss potential need for additional intravenous iron doses once ferritin results reviewed.  4.  RTC in 6 months for MD assessment and labs (CBC with diff, ferritin - day before) +/- Venofer.   Quentin MullingBryan Gray, NP  03/01/2017, 10:23 AM   I saw and evaluated the patient, participating in the key portions of the service and reviewing pertinent diagnostic studies and records.  I reviewed the nurse practitioner's note and agree with the findings and the plan.  The assessment and plan were discussed with the patient.  A few questions were asked by the patient and answered.   Nelva NayMelissa Corcoran, MD 03/01/2017,10:23 AM

## 2017-03-01 NOTE — Progress Notes (Signed)
Patient states she is not sleeping well due to leg pain.  Otherwise, no complaints.

## 2017-03-02 ENCOUNTER — Telehealth: Payer: Self-pay | Admitting: *Deleted

## 2017-03-02 NOTE — Telephone Encounter (Signed)
Called patient to inform her that she does not need IV iron at this time.  She should keep next scheduled appointment.  If she becomes symptomatic in the meantime, patient can call.   Verbalized understanding.

## 2017-03-14 DIAGNOSIS — J449 Chronic obstructive pulmonary disease, unspecified: Secondary | ICD-10-CM | POA: Diagnosis not present

## 2017-03-14 DIAGNOSIS — J439 Emphysema, unspecified: Secondary | ICD-10-CM | POA: Diagnosis not present

## 2017-03-14 DIAGNOSIS — J432 Centrilobular emphysema: Secondary | ICD-10-CM | POA: Diagnosis not present

## 2017-03-15 DIAGNOSIS — I10 Essential (primary) hypertension: Secondary | ICD-10-CM | POA: Diagnosis not present

## 2017-03-15 DIAGNOSIS — J449 Chronic obstructive pulmonary disease, unspecified: Secondary | ICD-10-CM | POA: Diagnosis not present

## 2017-03-15 DIAGNOSIS — M5136 Other intervertebral disc degeneration, lumbar region: Secondary | ICD-10-CM | POA: Diagnosis not present

## 2017-03-15 DIAGNOSIS — J309 Allergic rhinitis, unspecified: Secondary | ICD-10-CM | POA: Diagnosis not present

## 2017-04-02 DIAGNOSIS — M79604 Pain in right leg: Secondary | ICD-10-CM | POA: Diagnosis not present

## 2017-04-02 DIAGNOSIS — M79605 Pain in left leg: Secondary | ICD-10-CM | POA: Diagnosis not present

## 2017-04-02 DIAGNOSIS — J432 Centrilobular emphysema: Secondary | ICD-10-CM | POA: Diagnosis not present

## 2017-04-02 DIAGNOSIS — M5416 Radiculopathy, lumbar region: Secondary | ICD-10-CM | POA: Diagnosis not present

## 2017-04-02 DIAGNOSIS — Z0000 Encounter for general adult medical examination without abnormal findings: Secondary | ICD-10-CM | POA: Diagnosis not present

## 2017-04-02 DIAGNOSIS — I10 Essential (primary) hypertension: Secondary | ICD-10-CM | POA: Diagnosis not present

## 2017-04-02 DIAGNOSIS — G2581 Restless legs syndrome: Secondary | ICD-10-CM | POA: Diagnosis not present

## 2017-04-02 DIAGNOSIS — I5022 Chronic systolic (congestive) heart failure: Secondary | ICD-10-CM | POA: Diagnosis not present

## 2017-04-02 DIAGNOSIS — F411 Generalized anxiety disorder: Secondary | ICD-10-CM | POA: Diagnosis not present

## 2017-04-02 DIAGNOSIS — R0609 Other forms of dyspnea: Secondary | ICD-10-CM | POA: Diagnosis not present

## 2017-04-02 DIAGNOSIS — Z9981 Dependence on supplemental oxygen: Secondary | ICD-10-CM | POA: Diagnosis not present

## 2017-04-14 DIAGNOSIS — J449 Chronic obstructive pulmonary disease, unspecified: Secondary | ICD-10-CM | POA: Diagnosis not present

## 2017-04-14 DIAGNOSIS — J432 Centrilobular emphysema: Secondary | ICD-10-CM | POA: Diagnosis not present

## 2017-04-14 DIAGNOSIS — J439 Emphysema, unspecified: Secondary | ICD-10-CM | POA: Diagnosis not present

## 2017-05-14 DIAGNOSIS — J449 Chronic obstructive pulmonary disease, unspecified: Secondary | ICD-10-CM | POA: Diagnosis not present

## 2017-05-14 DIAGNOSIS — J432 Centrilobular emphysema: Secondary | ICD-10-CM | POA: Diagnosis not present

## 2017-05-14 DIAGNOSIS — J439 Emphysema, unspecified: Secondary | ICD-10-CM | POA: Diagnosis not present

## 2017-06-14 DIAGNOSIS — J439 Emphysema, unspecified: Secondary | ICD-10-CM | POA: Diagnosis not present

## 2017-06-14 DIAGNOSIS — J432 Centrilobular emphysema: Secondary | ICD-10-CM | POA: Diagnosis not present

## 2017-06-14 DIAGNOSIS — J449 Chronic obstructive pulmonary disease, unspecified: Secondary | ICD-10-CM | POA: Diagnosis not present

## 2017-07-14 DIAGNOSIS — J439 Emphysema, unspecified: Secondary | ICD-10-CM | POA: Diagnosis not present

## 2017-07-14 DIAGNOSIS — J432 Centrilobular emphysema: Secondary | ICD-10-CM | POA: Diagnosis not present

## 2017-07-14 DIAGNOSIS — J449 Chronic obstructive pulmonary disease, unspecified: Secondary | ICD-10-CM | POA: Diagnosis not present

## 2017-07-27 DIAGNOSIS — Z79899 Other long term (current) drug therapy: Secondary | ICD-10-CM | POA: Diagnosis not present

## 2017-07-27 DIAGNOSIS — M79604 Pain in right leg: Secondary | ICD-10-CM | POA: Diagnosis not present

## 2017-07-27 DIAGNOSIS — G2581 Restless legs syndrome: Secondary | ICD-10-CM | POA: Diagnosis not present

## 2017-07-27 DIAGNOSIS — J432 Centrilobular emphysema: Secondary | ICD-10-CM | POA: Diagnosis not present

## 2017-07-27 DIAGNOSIS — R8290 Unspecified abnormal findings in urine: Secondary | ICD-10-CM | POA: Diagnosis not present

## 2017-07-27 DIAGNOSIS — D649 Anemia, unspecified: Secondary | ICD-10-CM | POA: Diagnosis not present

## 2017-07-27 DIAGNOSIS — M79605 Pain in left leg: Secondary | ICD-10-CM | POA: Diagnosis not present

## 2017-07-27 DIAGNOSIS — I10 Essential (primary) hypertension: Secondary | ICD-10-CM | POA: Diagnosis not present

## 2017-08-03 DIAGNOSIS — J449 Chronic obstructive pulmonary disease, unspecified: Secondary | ICD-10-CM | POA: Diagnosis not present

## 2017-08-03 DIAGNOSIS — I10 Essential (primary) hypertension: Secondary | ICD-10-CM | POA: Diagnosis not present

## 2017-08-03 DIAGNOSIS — K635 Polyp of colon: Secondary | ICD-10-CM | POA: Diagnosis not present

## 2017-08-03 DIAGNOSIS — G2581 Restless legs syndrome: Secondary | ICD-10-CM | POA: Diagnosis not present

## 2017-08-03 DIAGNOSIS — J309 Allergic rhinitis, unspecified: Secondary | ICD-10-CM | POA: Diagnosis not present

## 2017-08-03 DIAGNOSIS — D508 Other iron deficiency anemias: Secondary | ICD-10-CM | POA: Diagnosis not present

## 2017-08-03 DIAGNOSIS — I5022 Chronic systolic (congestive) heart failure: Secondary | ICD-10-CM | POA: Diagnosis not present

## 2017-08-03 DIAGNOSIS — I2510 Atherosclerotic heart disease of native coronary artery without angina pectoris: Secondary | ICD-10-CM | POA: Diagnosis not present

## 2017-08-11 IMAGING — CT CT CHEST W/ CM
1 series · 15 of 31 positions shown, 19 images · IV contrast (omnipaque)
Comparison: 04/01/2014

CLINICAL DATA: Lung nodule, follow up

EXAM:
CT CHEST WITH CONTRAST
TECHNIQUE: Multidetector CT imaging of the chest was performed during
intravenous contrast administration.
CONTRAST:  60mL OMNIPAQUE IOHEXOL 300 MG/ML  SOLN

[Series 2: axial st · axial · 0.62mm/px · z∈[-596,-346]mm · 15 of 56 slices shown, 19 images]
[im 3/56  mediastinal]
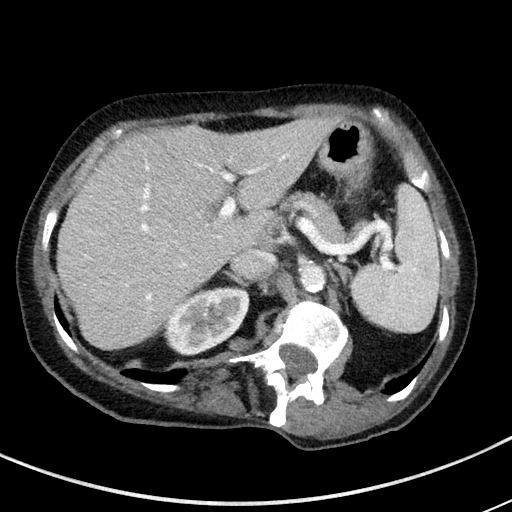
[im 3/56  lung]
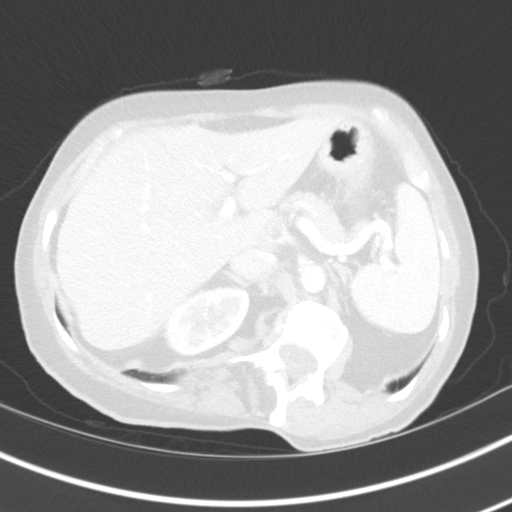
[im 7/56  lung]
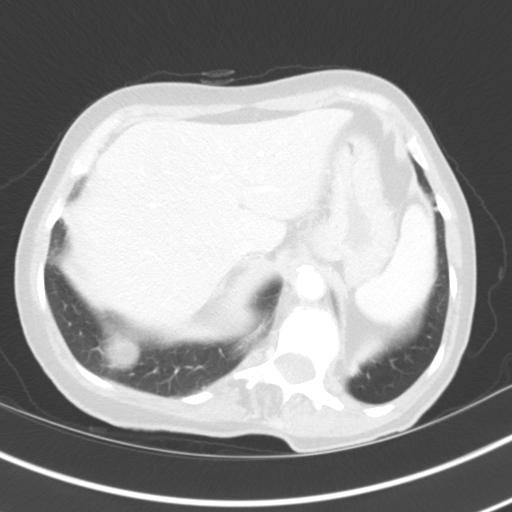
[im 11/56  lung]
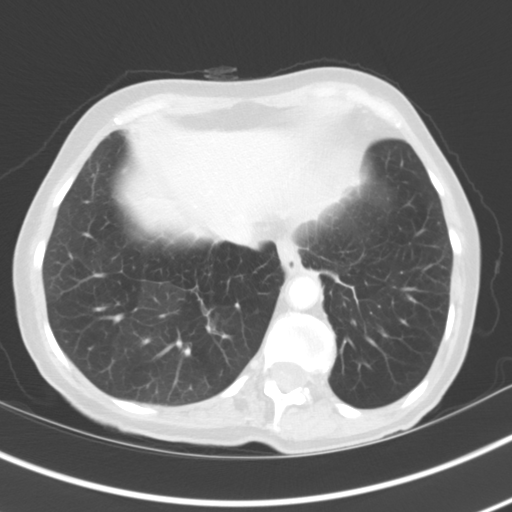
[im 13/56  lung]
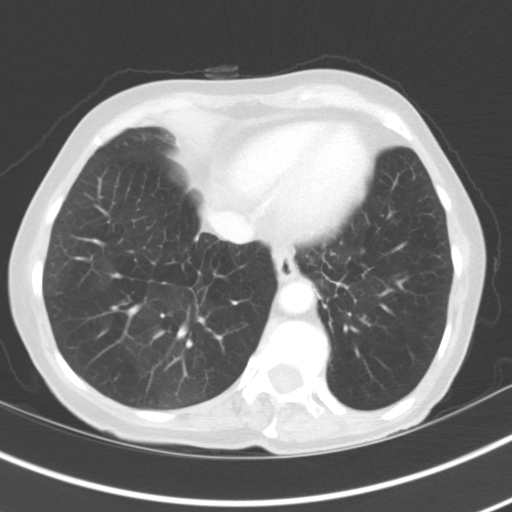
[im 17/56  mediastinal]
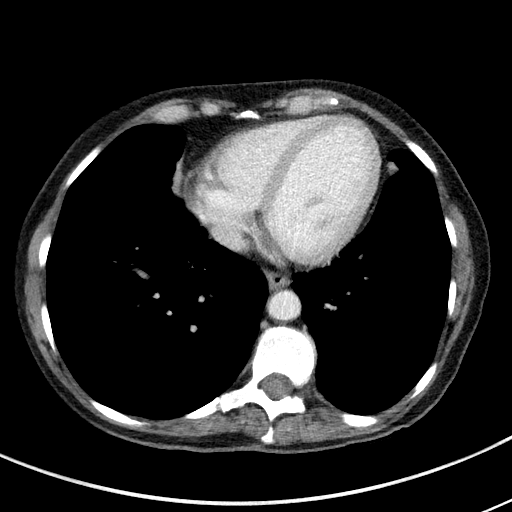
[im 17/56  lung]
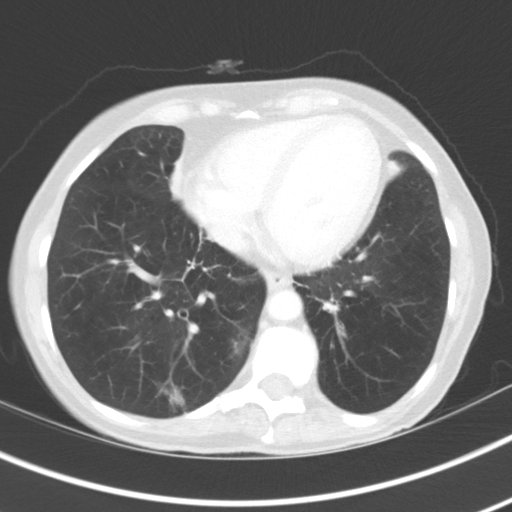
[im 21/56  lung]
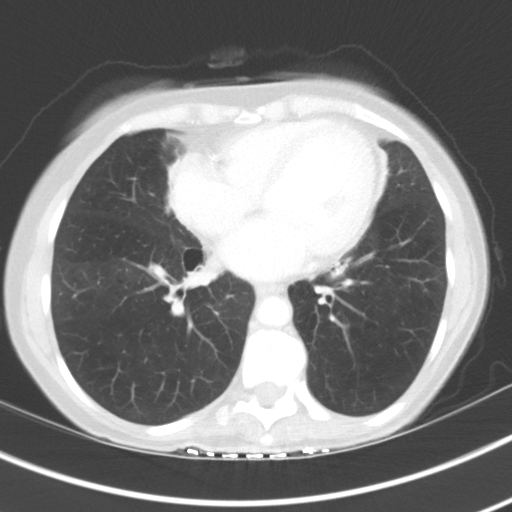
[im 25/56  lung]
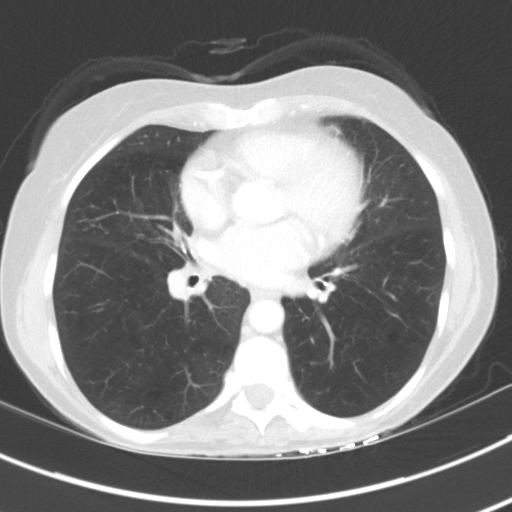
[im 29/56  lung]
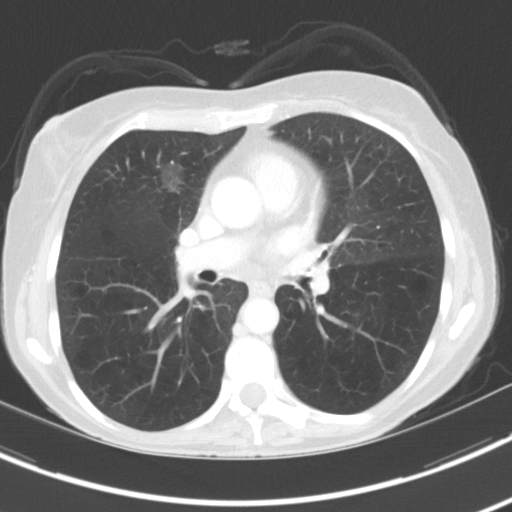
[im 31/56  mediastinal]
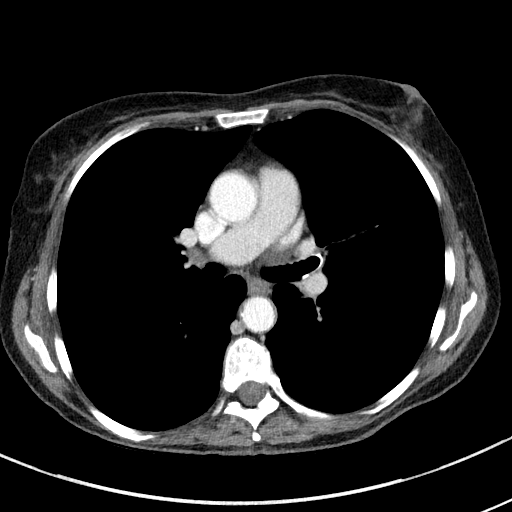
[im 31/56  lung]
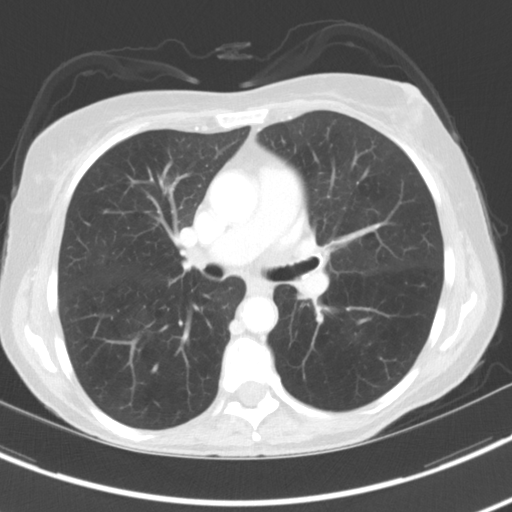
[im 34/56  lung]
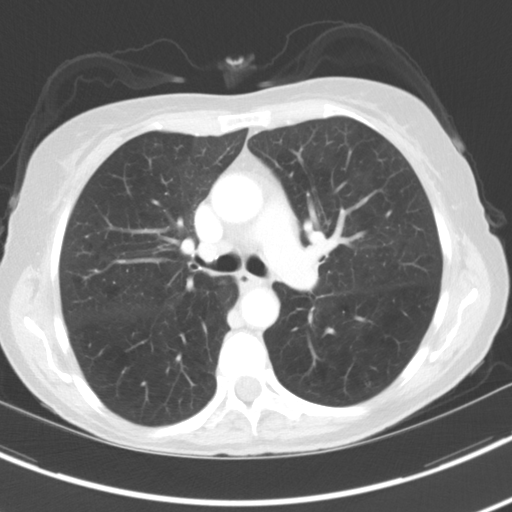
[im 37/56  lung]
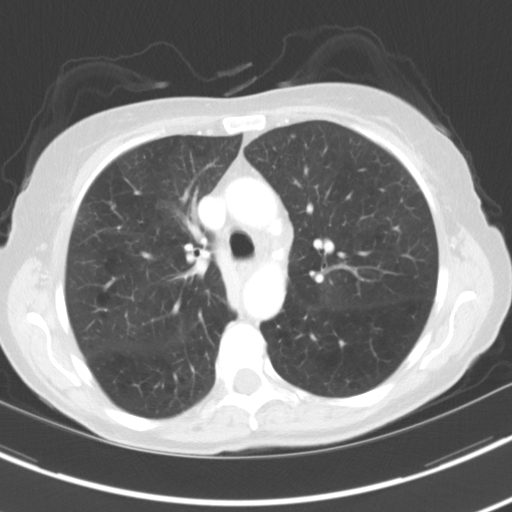
[im 41/56  lung]
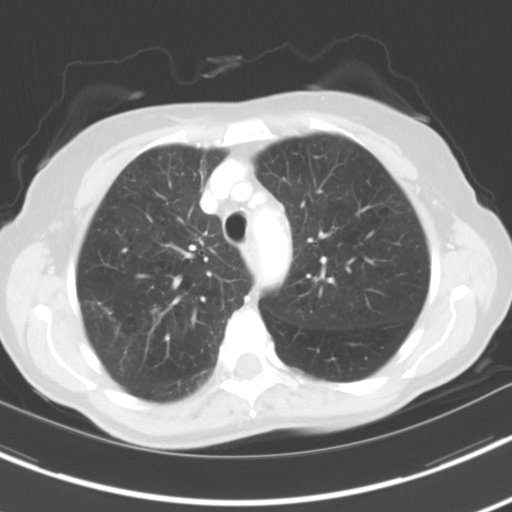
[im 45/56  mediastinal]
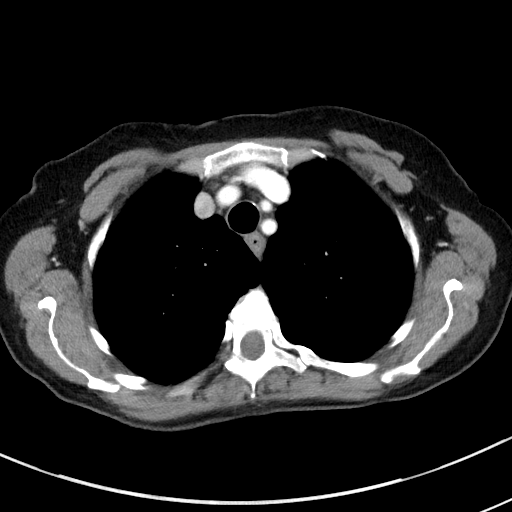
[im 45/56  lung]
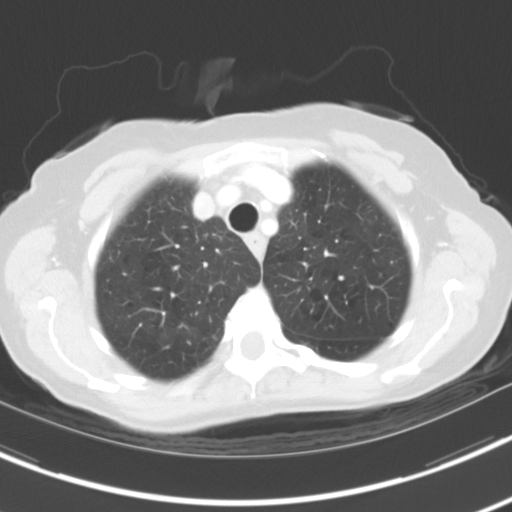
[im 49/56  lung]
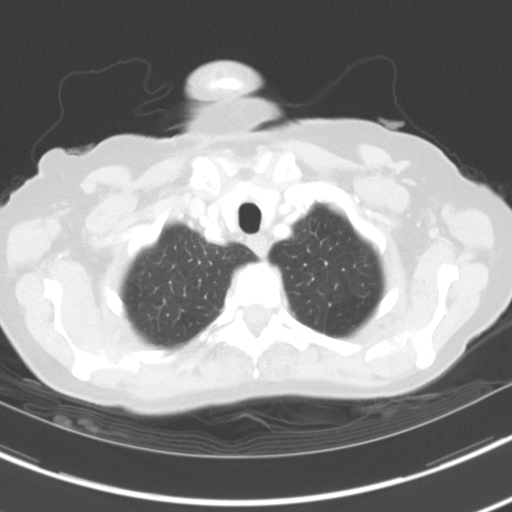
[im 53/56  lung]
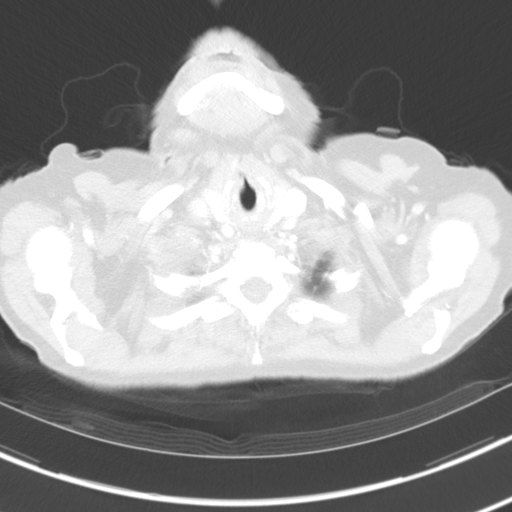

[15 of 31 positions shown; findings below may reference images not displayed]

FINDINGS: 3 mm nodule again noted in the lingula on image 20, stable.
Ground-glass nodular density is present in the right upper lobe on
image 17 measuring 16 mm, new since prior study. Scattered
ground-glass densities also in the right lower lobe posteriorly on
image 39 -42 and in the right middle lobe on image 29. Given that
these are new since prior study and multifocal, I favor these are
infectious or inflammatory, but warrant follow-up. No pleural
effusions. Underlying emphysema.

Heart is borderline in size. Coronary artery calcifications in the
right coronary artery. Aorta is normal caliber. No mediastinal,
hilar, or axillary adenopathy. Chest wall soft tissues are
unremarkable. Imaging into the upper abdomen shows no acute
findings.

No acute bony abnormality or focal bone lesion. Scoliosis in the
thoracic spine.
IMPRESSION: Stable 3 mm lingular nodule. There are new ground-glass opacities in
the right upper lobe, right middle lobe and right lower lobe. Given
that these were not present on prior study, I favor these are
infectious or inflammatory. Recommend follow-up CT in 3-6 months.

Coronary artery disease.

## 2017-08-14 DIAGNOSIS — J449 Chronic obstructive pulmonary disease, unspecified: Secondary | ICD-10-CM | POA: Diagnosis not present

## 2017-08-14 DIAGNOSIS — J439 Emphysema, unspecified: Secondary | ICD-10-CM | POA: Diagnosis not present

## 2017-08-14 DIAGNOSIS — J432 Centrilobular emphysema: Secondary | ICD-10-CM | POA: Diagnosis not present

## 2017-08-29 ENCOUNTER — Inpatient Hospital Stay: Payer: PPO | Attending: Hematology and Oncology

## 2017-08-30 ENCOUNTER — Inpatient Hospital Stay: Payer: PPO | Admitting: Hematology and Oncology

## 2017-08-30 ENCOUNTER — Inpatient Hospital Stay: Payer: PPO

## 2017-08-30 NOTE — Progress Notes (Deleted)
[name]-  Cancer Center  Clinic day:  08/30/17  Chief Complaint: Tanya Rhodes is a 77 y.o. female with a history of iron deficiency anemia who is seen for 6 month assessment.  HPI:  The patient was last seen in the medical oncology clinic on 03/01/2017.  At that time, she described aching in her legs which she associated with "low iron".  Exam was stable.  Hematocrit was 37.0.  Ferritin was 48.   At last visit, we discussed follow-up in the GI [facility].  During the interim,   Past Medical History:  Diagnosis Date  . Allergic rhinitis   . Centrilobular emphysema (HCC)   . Chronic systolic CHF (congestive heart failure) (HCC)   . Colon polyposis   . COPD (chronic obstructive pulmonary disease) (HCC)   . Coronary artery disease involving native coronary artery of native heart without angina pectoris   . DDD (degenerative disc disease), lumbar   . Essential hypertension   . HLD (hyperlipidemia)   . Hypertension   . IDA (iron deficiency anemia)   . Low serum vitamin D   . Lumbar radiculitis   . Lumbar spondylitis (HCC)   . Lumbar stenosis with neurogenic claudication   . Melena   . PUD (peptic ulcer disease)   . Restless leg   . Rotator cuff rupture   . VHD (valvular heart disease)     Past Surgical History:  Procedure Laterality Date  . BREAST BIOPSY Right 11/20/11   neg [place] core  . COLECTOMY    . CORONARY ANGIOPLASTY WITH STENT PLACEMENT      Family History  Problem Relation Age of Onset  . Heart disease Brother   . Heart disease Sister     Social History:  reports that she has quit smoking. She has never used smokeless tobacco. She reports that she does not drink alcohol or use drugs.  She has a greater than 30 pack year smoking history.  She quit smoking in 1995.  The patient's son can not bring her to clinic on Tuesdays and Thursdays.  She lives in [place].    Allergies:  Allergies  Allergen Reactions  . Ropinirole  Anxiety and Other (See Comments)    Reaction:  Severe leg pain   . Aspirin Other (See Comments)    GIB  . Ferrous Fumarate Itching  . Ferrous Sulfate Diarrhea and Nausea And Vomiting  . Omeprazole Nausea And Vomiting  . Oxycodone Other (See Comments)    Reaction:  Makes pt hyper   . Prednisone Other (See Comments)    Reaction:  Makes pt hyper     Current Medications: Current Outpatient Medications  Medication Sig Dispense Refill  . acetaminophen (TYLENOL) 500 MG tablet Take 500 mg by mouth every 6 (six) hours as needed for mild pain, moderate pain, fever or headache.     . albuterol (PROVENTIL HFA;VENTOLIN HFA) 108 (90 Base) MCG/ACT inhaler Inhale 2 puffs into the lungs every 4 (four) hours as needed for wheezing or shortness of breath.     Marland Kitchen albuterol (PROVENTIL) (2.5 MG/3ML) 0.083% nebulizer solution Take 2.5 mg by nebulization every 6 (six) hours as needed for wheezing or shortness of breath.    . ALPRAZolam (XANAX) 0.25 MG tablet Take 0.25 mg by mouth at bedtime as needed for anxiety or sleep.    . budesonide-formoterol (SYMBICORT) 160-4.5 MCG/ACT inhaler Inhale 2 puffs into the lungs 2 (two) times daily.    . cyanocobalamin 1000 MCG tablet  Take 1,000 mcg by mouth daily.    Marland Kitchen escitalopram (LEXAPRO) 10 MG tablet Take 10 mg by mouth daily.  0  . fluticasone (FLONASE) 50 MCG/ACT nasal spray Place 2 sprays into both nostrils daily.    . furosemide (LASIX) 20 MG tablet Take 20 mg by mouth daily.    Marland Kitchen gabapentin (NEURONTIN) 300 MG capsule Take 600 mg by mouth at bedtime.    Marland Kitchen lisinopril (PRINIVIL,ZESTRIL) 2.5 MG tablet Take 2.5 mg by mouth daily.    . metoprolol succinate (TOPROL-XL) 25 MG 24 hr tablet Take 25 mg by mouth daily.  1  . ranitidine (ZANTAC) 150 MG tablet Take 150 mg by mouth 2 (two) times daily.    . simvastatin (ZOCOR) 20 MG tablet Take 20 mg by mouth at bedtime.    Marland Kitchen SPIRIVA RESPIMAT 2.5 MCG/ACT AERS Inhale 2 puffs into the lungs daily.   0  . traMADol (ULTRAM) 50 MG  tablet Take 75 mg by mouth 2 (two) times daily as needed for moderate pain.     . Vitamin D, Ergocalciferol, (DRISDOL) 50000 units CAPS capsule Take 50,000 Units by mouth every 7 (seven) days. Pt takes on Saturday.     No current facility-administered medications for this visit.     Review of Systems:  GENERAL:  Feels "pretty good".  No fevers or sweats.  Baseline weight 104-107 pounds.  Weight down 2  pounds. PERFORMANCE STATUS (ECOG):  1 HEENT:  No visual changes, runny nose, sore throat, mouth sores or tenderness.  Ticking in ear. Lungs: COPD.  Chronic shortness of breath with exertion.  No cough.  No hemoptysis. On oxygen 2 liters/min. Cardiac:  No chest pain, palpitations, orthopnea, or PND. GI:  Eating well.  No nausea, vomiting, diarrhea, constipation, melena or hematochezia. Hemorrhoids. GU:  No urgency, frequency, dysuria, or hematuria. Musculoskeletal:  Pain in hips down due to spinal stenosis.  No muscle tenderness. Extremities:  Legs hurting.  No swelling. Skin:  No rashes or skin changes. Neuro:  Restless legs.  No headache, numbness or weakness, balance or coordination issues. Endocrine:  No diabetes, thyroid issues, hot flashes or night sweats. Psych:  No mood changes, depression or anxiety. Pain:  No focal pain. Review of systems:  All other systems reviewed and found to be negative.  Physical Exam: There were no vitals taken for this visit. GENERAL:  Thin elderly woman sitting comfortably in the exam room in no acute distress. MENTAL STATUS:  Alert and oriented to person, place and time. HEAD:  Short styled gray hair.  Normocephalic, atraumatic, face symmetric, no Cushingoid features. EYES:  Glasses.  Blue eyes.  Pupils equal round and reactive to light and accomodation.  No conjunctivitis or scleral icterus. ENT:  [place]ph[place] in place.  Oropharynx clear without lesion.  Tongue normal. Mucous membranes moist.  RESPIRATORY:  Clear to auscultation without rales, wheezes or  rhonchi. CARDIOVASCULAR:  Regular rate and rhythm without murmur, rub or gallop. ABDOMEN:  Soft, non-tender, with active bowel sounds, and no hepatosplenomegaly.  No masses. SKIN:  No rashes, ulcers or lesions. EXTREMITIES: No edema, no skin discoloration or tenderness.  No palpable cords. LYMPH NODES: No palpable cervical, supraclavicular, axillary or inguinal adenopathy  NEUROLOGICAL: Unremarkable. PSYCH:  Appropriate.    No visits with results within 3 Day(s) from this visit.  Latest known visit with results is:  Appointment on 03/01/2017  Component Date Value Ref Range Status  . Iron 03/01/2017 67  28 - 170 ug/dL Final  . TIBC  03/01/2017 349  250 - 450 ug/dL Final  . Saturation Ratios 03/01/2017 19  10.4 - 31.8 % Final  . UIBC 03/01/2017 282  ug/dL Final   Performed at [facility], [address] [place] [place], [place] [address]  . Folate 03/01/2017 19.0  >5.9 ng/mL Final   Performed at [facility], [address] [place] [place], [place] [address]  . Vitamin B-12 03/01/2017 1,175* 180 - 914 pg/mL Final   Comment: (NOTE) This assay is not validated for testing neonatal or myeloproliferative syndrome specimens for Vitamin B12 levels. Performed at [facility], [address] [place], [place] [address]   . Ferritin 03/01/2017 48  11 - 307 ng/mL Final   Performed at [facility], [address] [place] [place], [place] [address]  . WBC 03/01/2017 4.9  3.6 - 11.0 K/uL Final  . RBC 03/01/2017 4.24  3.80 - 5.20 MIL/uL Final  . Hemoglobin 03/01/2017 12.3  12.0 - 16.0 g/dL Final  . HCT 29/56/2130 37.0  35.0 - 47.0 % Final  . MCV 03/01/2017 87.4  80.0 - 100.0 fL Final  . MCH 03/01/2017 29.1  26.0 - 34.0 pg Final  . MCHC 03/01/2017 33.3  32.0 - 36.0 g/dL Final  . RDW 86/57/8469 13.8  11.5 - 14.5 % Final  . Platelets 03/01/2017 219  150 - 440 K/uL Final  . Neutrophils Relative % 03/01/2017 39  % Final  . Neutro Abs 03/01/2017 1.9  1.4 - 6.5 K/uL Final  .  Lymphocytes Relative 03/01/2017 46  % Final  . Lymphs Abs 03/01/2017 2.2  1.0 - 3.6 K/uL Final  . Monocytes Relative 03/01/2017 11  % Final  . Monocytes Absolute 03/01/2017 0.6  0.2 - 0.9 K/uL Final  . Eosinophils Relative 03/01/2017 3  % Final  . Eosinophils Absolute 03/01/2017 0.2  0 - 0.7 K/uL Final  . Basophils Relative 03/01/2017 1  % Final  . Basophils Absolute 03/01/2017 0.0  0 - 0.1 K/uL Final   Performed at [facility], [address] [place], [place] [address]    Assessment:  LUDENE STOKKE is a 77 y.o. female with iron deficiency.  She has a history of bleeding gastric ulcer years ago and colonic polyps s/p partial colectomy in 2013.  She required 2 units of PRBCs in 2015.  Colonoscopy on 05/15/2013 revealed a patent ileocolonic anastomosis and internal hemorrhoids.  EGD on 05/15/2013 was normal.  Guaiac cards were negative on 09/21/2014 and 05/03/2015.  Diet is modest.  Oral iron causes diarrhea.  She receives Venofer if her ferritin is < 30.  She received Venofer 600 mg (04/26/2015 - 05/17/2015), 400 mg (10/06/2015- 10/12/2015), 200 mg (02/01/2016), and 400 mg (08/02/2016 - 08/08/2016).  Ferritin has been followed: 5 on 04/19/2015, 92 on 05/24/2015, 26 on 08/23/2015, 11 on 09/30/2015, 35 on 11/23/2015, 29 on 01/25/2016, 16 on 07/31/2016, and 48 on 03/01/2017.  She has a history of B12 deficiency and is on oral B12.  B12 was 1175 on 03/01/2017.  Folate was normal on 03/01/2017.  Work-up on 04/19/2015 revealed a hematocrit of 28.3, hemoglobin 8.7, MCV 62.2, platelets 240,000, WBC 5500 with an ANC of 3000.  Ferritin was 5 (low) with an iron saturation of 3% (low) and a TIBC of 539 (high).  B12 and folate were normal.   She has COPD and is on oxygen.  She has a greater than 30 pack year smoking history.  Chest CT angiogram on 04/01/2014 revealed no pulmonary embolism but a  4 mm left upper lobe pulmonary nodule.  Chest CT on 03/05/2015 revealed a stable 3 mm lingular nodule.   There were new ground-glass opacities in the right upper lobe, right middle lobe and right lower lobe suggestive of an infectious or inflammatory process. Chest CT on 07/30/2015 revealed a stable 3 mm LUL nodule (benign).  Symptomatically, she has aching in her legs which she associates with "low iron".  Exam is stable.  Hematocrit is 37.0.  Ferritin is 48.   Plan: 1.  Labs today:  CBC with diff, ferritin. 2.  Iron deficiency anemia: 3.  B12 deficiency:  Continue oral B12.  B12 normal on supplementation (last checked 03/01/2017).  Labs today:  CBC with diff, ferritin, iron studies, B12, folate. 2.  Discuss follow-up with GI [facility] secondary to recurrent iron deficiency. 3.  Discuss potential need for additional intravenous iron doses once ferritin results reviewed.  4.  RTC in 6 months for MD assessment and labs (CBC with diff, ferritin - day before) +/- Venofer.   Rosey BathMelissa C Corcoran, MD  08/30/2017, 5:20 AM   I saw and evaluated the patient, participating in the key portions of the service and reviewing pertinent diagnostic studies and records.  I reviewed the nurse practitioner's note and agree with the findings and the plan.  The assessment and plan were discussed with the patient.  A few questions were asked by the patient and answered.   Nelva NayMelissa Corcoran, MD 08/30/2017,5:20 AM

## 2017-09-14 DIAGNOSIS — J449 Chronic obstructive pulmonary disease, unspecified: Secondary | ICD-10-CM | POA: Diagnosis not present

## 2017-09-14 DIAGNOSIS — J439 Emphysema, unspecified: Secondary | ICD-10-CM | POA: Diagnosis not present

## 2017-09-14 DIAGNOSIS — J432 Centrilobular emphysema: Secondary | ICD-10-CM | POA: Diagnosis not present

## 2017-09-28 ENCOUNTER — Other Ambulatory Visit: Payer: Self-pay | Admitting: Internal Medicine

## 2017-09-28 DIAGNOSIS — Z1231 Encounter for screening mammogram for malignant neoplasm of breast: Secondary | ICD-10-CM

## 2017-10-09 ENCOUNTER — Ambulatory Visit
Admission: RE | Admit: 2017-10-09 | Discharge: 2017-10-09 | Disposition: A | Discharge status: Home / Self Care | Payer: PPO | Source: Ambulatory Visit | Attending: Internal Medicine | Admitting: Internal Medicine

## 2017-10-09 DIAGNOSIS — Z1231 Encounter for screening mammogram for malignant neoplasm of breast: Secondary | ICD-10-CM | POA: Diagnosis not present

## 2017-10-15 DIAGNOSIS — J432 Centrilobular emphysema: Secondary | ICD-10-CM | POA: Diagnosis not present

## 2017-10-15 DIAGNOSIS — J449 Chronic obstructive pulmonary disease, unspecified: Secondary | ICD-10-CM | POA: Diagnosis not present

## 2017-10-15 DIAGNOSIS — J439 Emphysema, unspecified: Secondary | ICD-10-CM | POA: Diagnosis not present

## 2017-10-18 ENCOUNTER — Inpatient Hospital Stay: Payer: PPO | Attending: Hematology and Oncology

## 2017-10-18 DIAGNOSIS — Z79899 Other long term (current) drug therapy: Secondary | ICD-10-CM | POA: Insufficient documentation

## 2017-10-18 DIAGNOSIS — D509 Iron deficiency anemia, unspecified: Secondary | ICD-10-CM | POA: Diagnosis not present

## 2017-10-18 LAB — CBC WITH DIFFERENTIAL/PLATELET
Abs Immature Granulocytes: 0.01 10*3/uL (ref 0.00–0.07)
Basophils Absolute: 0 10*3/uL (ref 0.0–0.1)
Basophils Relative: 1 %
Eosinophils Absolute: 0.1 10*3/uL (ref 0.0–0.5)
Eosinophils Relative: 1 %
HCT: 34.9 % — ABNORMAL LOW (ref 36.0–46.0)
Hemoglobin: 11.3 g/dL — ABNORMAL LOW (ref 12.0–15.0)
Immature Granulocytes: 0 %
Lymphocytes Relative: 40 %
Lymphs Abs: 1.7 10*3/uL (ref 0.7–4.0)
MCH: 28.5 pg (ref 26.0–34.0)
MCHC: 32.4 g/dL (ref 30.0–36.0)
MCV: 88.1 fL (ref 80.0–100.0)
Monocytes Absolute: 0.4 10*3/uL (ref 0.1–1.0)
Monocytes Relative: 10 %
Neutro Abs: 2 10*3/uL (ref 1.7–7.7)
Neutrophils Relative %: 48 %
Platelets: 236 10*3/uL (ref 150–400)
RBC: 3.96 MIL/uL (ref 3.87–5.11)
RDW: 12.8 % (ref 11.5–15.5)
WBC: 4.2 10*3/uL (ref 4.0–10.5)
nRBC: 0 % (ref 0.0–0.2)

## 2017-10-18 LAB — FERRITIN: Ferritin: 19 ng/mL (ref 11–307)

## 2017-10-19 ENCOUNTER — Encounter: Payer: Self-pay | Admitting: Hematology and Oncology

## 2017-10-19 ENCOUNTER — Inpatient Hospital Stay: Payer: PPO

## 2017-10-19 ENCOUNTER — Inpatient Hospital Stay (HOSPITAL_BASED_OUTPATIENT_CLINIC_OR_DEPARTMENT_OTHER): Payer: PPO | Admitting: Hematology and Oncology

## 2017-10-19 VITALS — BP 146/68 | HR 71 | Temp 97.8°F | Resp 18 | Ht 60.0 in | Wt 104.7 lb

## 2017-10-19 DIAGNOSIS — Z79899 Other long term (current) drug therapy: Secondary | ICD-10-CM | POA: Diagnosis not present

## 2017-10-19 DIAGNOSIS — D509 Iron deficiency anemia, unspecified: Secondary | ICD-10-CM | POA: Diagnosis not present

## 2017-10-19 MED ORDER — SODIUM CHLORIDE 0.9 % IV SOLN
Freq: Once | INTRAVENOUS | Status: AC
  Administered 2017-10-19: 14:00:00 via INTRAVENOUS
  Filled 2017-10-19: qty 250

## 2017-10-19 MED ORDER — SODIUM CHLORIDE 0.9 % IJ SOLN
3.0000 mL | Freq: Once | INTRAMUSCULAR | Status: DC | PRN
  Filled 2017-10-19: qty 10

## 2017-10-19 MED ORDER — HEPARIN SOD (PORK) LOCK FLUSH 100 UNIT/ML IV SOLN: 250.0000 [IU] | Freq: Once | INTRAVENOUS | Status: DC | PRN

## 2017-10-19 MED ORDER — ALTEPLASE 2 MG IJ SOLR: 2.0000 mg | Freq: Once | INTRAMUSCULAR | Status: DC | PRN

## 2017-10-19 MED ORDER — IRON SUCROSE 20 MG/ML IV SOLN
200.0000 mg | Freq: Once | INTRAVENOUS | Status: AC
  Administered 2017-10-19: 200 mg via INTRAVENOUS
  Filled 2017-10-19: qty 10

## 2017-10-19 MED ORDER — SODIUM CHLORIDE 0.9 % IJ SOLN
10.0000 mL | INTRAMUSCULAR | Status: DC | PRN
  Filled 2017-10-19: qty 10

## 2017-10-19 MED ORDER — HEPARIN SOD (PORK) LOCK FLUSH 100 UNIT/ML IV SOLN: 500.0000 [IU] | Freq: Once | INTRAVENOUS | Status: DC | PRN

## 2017-10-19 NOTE — Progress Notes (Signed)
[name]-  Cancer Center  Clinic day:  10/19/17  Chief Complaint: AFTYN NOTT is a 77 y.o. female with a history of iron deficiency anemia who is seen for 7 month assessment.  HPI:  The patient was last seen in the medical oncology clinic on 03/01/2017.  At that time,  she had aching in her legs which she associated with "low iron".  Exam was stable.  Hematocrit was 37.0.  Ferritin was 48. Follow-up with the GI [facility] was recommended.  CBC on 10/18/2017 revealed a hematocrit of 34.9, hemoglobin 11.3, and MCV 88.1.  Ferritin was 19.  During the interim, she denies any new symptoms.  She has restless legs.  Appetite is good.  Weight is down 1 pound.  Bilateral screening mammogram on 10/09/2017 revealed no evidence of malignancy.   Past Medical History:  Diagnosis Date  . Allergic rhinitis   . Centrilobular emphysema (HCC)   . Chronic systolic CHF (congestive heart failure) (HCC)   . Colon polyposis   . COPD (chronic obstructive pulmonary disease) (HCC)   . Coronary artery disease involving native coronary artery of native heart without angina pectoris   . DDD (degenerative disc disease), lumbar   . Essential hypertension   . HLD (hyperlipidemia)   . Hypertension   . IDA (iron deficiency anemia)   . Low serum vitamin D   . Lumbar radiculitis   . Lumbar spondylitis (HCC)   . Lumbar stenosis with neurogenic claudication   . Melena   . PUD (peptic ulcer disease)   . Restless leg   . Rotator cuff rupture   . VHD (valvular heart disease)     Past Surgical History:  Procedure Laterality Date  . BREAST BIOPSY Right 11/20/11   neg [place] core  . COLECTOMY    . CORONARY ANGIOPLASTY WITH STENT PLACEMENT      Family History  Problem Relation Age of Onset  . Heart disease Brother   . Heart disease Sister     Social History:  reports that she has quit smoking. She has never used smokeless tobacco. She reports that she does not drink alcohol  or use drugs.  She has a greater than 30 pack year smoking history.  She quit smoking in 1995.  The patient's son can not bring her to clinic on Tuesdays and Thursdays.  She lives in [place].    Allergies:  Allergies  Allergen Reactions  . Ropinirole Anxiety and Other (See Comments)    Reaction:  Severe leg pain   . Aspirin Other (See Comments)    GIB  . Ferrous Fumarate Itching  . Ferrous Sulfate Diarrhea and Nausea And Vomiting  . Omeprazole Nausea And Vomiting  . Oxycodone Other (See Comments)    Reaction:  Makes pt hyper   . Prednisone Other (See Comments)    Reaction:  Makes pt hyper     Current Medications: Current Outpatient Medications  Medication Sig Dispense Refill  . budesonide-formoterol (SYMBICORT) 160-4.5 MCG/ACT inhaler Inhale 2 puffs into the lungs 2 (two) times daily.    . cyanocobalamin 1000 MCG tablet Take 1,000 mcg by mouth daily.    Marland Kitchen escitalopram (LEXAPRO) 10 MG tablet Take 10 mg by mouth daily.  0  . fluticasone (FLONASE) 50 MCG/ACT nasal spray Place 2 sprays into both nostrils daily.    . furosemide (LASIX) 20 MG tablet Take 20 mg by mouth daily.    Marland Kitchen lisinopril (PRINIVIL,ZESTRIL) 2.5 MG tablet Take 2.5 mg by  mouth daily.    . metoprolol succinate (TOPROL-XL) 25 MG 24 hr tablet Take 25 mg by mouth daily.  1  . ranitidine (ZANTAC) 150 MG tablet Take 150 mg by mouth 2 (two) times daily.    . simvastatin (ZOCOR) 20 MG tablet Take 20 mg by mouth at bedtime.    Marland Kitchen SPIRIVA RESPIMAT 2.5 MCG/ACT AERS Inhale 2 puffs into the lungs daily.   0  . traMADol (ULTRAM) 50 MG tablet Take 75 mg by mouth 2 (two) times daily as needed for moderate pain.     . Vitamin D, Ergocalciferol, (DRISDOL) 50000 units CAPS capsule Take 50,000 Units by mouth every 7 (seven) days. Pt takes on Saturday.    Marland Kitchen acetaminophen (TYLENOL) 500 MG tablet Take 500 mg by mouth every 6 (six) hours as needed for mild pain, moderate pain, fever or headache.     . albuterol (PROVENTIL HFA;VENTOLIN HFA)  108 (90 Base) MCG/ACT inhaler Inhale 2 puffs into the lungs every 4 (four) hours as needed for wheezing or shortness of breath.     Marland Kitchen albuterol (PROVENTIL) (2.5 MG/3ML) 0.083% nebulizer solution Take 2.5 mg by nebulization every 6 (six) hours as needed for wheezing or shortness of breath.    . ALPRAZolam (XANAX) 0.25 MG tablet Take 0.25 mg by mouth at bedtime as needed for anxiety or sleep.    Marland Kitchen gabapentin (NEURONTIN) 300 MG capsule Take 600 mg by mouth at bedtime.     No current facility-administered medications for this visit.     Review of Systems:  GENERAL:  Feels good.  No fevers, sweats.  Weight loss of 1 pound. PERFORMANCE STATUS (ECOG):  1 HEENT:  No visual changes, runny nose, sore throat, mouth sores or tenderness. Lungs: COPD.  Shortness of breath with exertion.  No cough.  No hemoptysis. Cardiac:  No chest pain, palpitations, orthopnea, or PND. GI:  Good appetite.  No nausea, vomiting, diarrhea, constipation, melena or hematochezia. GU:  No urgency, frequency, dysuria, or hematuria. Musculoskeletal:  Spinal stenosis.  No back pain.  No joint pain.  No muscle tenderness. Extremities:  No pain or swelling. Skin:  No rashes or skin changes. Neuro:  Restless legs.  No headache, numbness or weakness, balance or coordination issues. Endocrine:  No diabetes, thyroid issues, hot flashes or night sweats. Psych:  No mood changes, depression or anxiety. Pain:  No focal pain. Review of systems:  All other systems reviewed and found to be negative.   Physical Exam: Blood pressure (!) 146/68, pulse 71, temperature 97.8 F (36.6 C), temperature source Tympanic, resp. rate 18, height 5' (1.524 m), weight 104 lb 11.2 oz (47.5 kg), SpO2 99 %. GENERAL: Thin woman sitting comfortably in the exam room in no acute distress. MENTAL STATUS:  Alert and oriented to person, place and time. HEAD:  Styled gray hair.  Normocephalic, atraumatic, face symmetric, no Cushingoid features. EYES:  Glasses.   Blue eyes.  Pupils equal round and reactive to light and accomodation.  No conjunctivitis or scleral icterus. ENT:  Oropharynx clear without lesion.  Tongue normal. Mucous membranes moist.  RESPIRATORY:  Clear to auscultation without rales, wheezes or rhonchi. CARDIOVASCULAR:  Regular rate and rhythm without murmur, rub or gallop. ABDOMEN:  Soft, non-tender, with active bowel sounds, and no hepatosplenomegaly.  No masses. SKIN:  No rashes, ulcers or lesions. EXTREMITIES: No edema, no skin discoloration or tenderness.  No palpable cords. LYMPH NODES: No palpable cervical, supraclavicular, axillary or inguinal adenopathy  NEUROLOGICAL: Unremarkable. PSYCH:  Appropriate.    Appointment on 10/18/2017  Component Date Value Ref Range Status  . Ferritin 10/18/2017 19  11 - 307 ng/mL Final   Performed at [facility], [address] [place] [place], [place] [address]  . WBC 10/18/2017 4.2  4.0 - 10.5 K/uL Final  . RBC 10/18/2017 3.96  3.87 - 5.11 MIL/uL Final  . Hemoglobin 10/18/2017 11.3* 12.0 - 15.0 g/dL Final  . HCT 60/45/4098 34.9* 36.0 - 46.0 % Final  . MCV 10/18/2017 88.1  80.0 - 100.0 fL Final  . MCH 10/18/2017 28.5  26.0 - 34.0 pg Final  . MCHC 10/18/2017 32.4  30.0 - 36.0 g/dL Final  . RDW 11/91/4782 12.8  11.5 - 15.5 % Final  . Platelets 10/18/2017 236  150 - 400 K/uL Final  . nRBC 10/18/2017 0.0  0.0 - 0.2 % Final  . Neutrophils Relative % 10/18/2017 48  % Final  . Neutro Abs 10/18/2017 2.0  1.7 - 7.7 K/uL Final  . Lymphocytes Relative 10/18/2017 40  % Final  . Lymphs Abs 10/18/2017 1.7  0.7 - 4.0 K/uL Final  . Monocytes Relative 10/18/2017 10  % Final  . Monocytes Absolute 10/18/2017 0.4  0.1 - 1.0 K/uL Final  . Eosinophils Relative 10/18/2017 1  % Final  . Eosinophils Absolute 10/18/2017 0.1  0.0 - 0.5 K/uL Final  . Basophils Relative 10/18/2017 1  % Final  . Basophils Absolute 10/18/2017 0.0  0.0 - 0.1 K/uL Final  . Immature Granulocytes 10/18/2017 0  % Final  . Abs  Immature Granulocytes 10/18/2017 0.01  0.00 - 0.07 K/uL Final   Performed at [facility], [address] [place], [place] [address]    Assessment:  TRACI GAFFORD is a 77 y.o. female with iron deficiency.  She has a history of bleeding gastric ulcer years ago and colonic polyps s/p partial colectomy in 2013.  She required 2 units of PRBCs in 2015.  Colonoscopy on 05/15/2013 revealed a patent ileocolonic anastomosis and internal hemorrhoids.  EGD on 05/15/2013 was normal.  Guaiac cards were negative on 09/21/2014 and 05/03/2015.  Diet is modest.  Oral iron causes diarrhea.  She receives Venofer if her ferritin is < 30.  She received Venofer 600 mg (04/26/2015 - 05/17/2015), 400 mg (10/06/2015- 10/12/2015), 200 mg (02/01/2016), and 400 mg (08/02/2016 - 08/08/2016).  Ferritin has been followed: 5 on 04/19/2015, 92 on 05/24/2015, 26 on 08/23/2015, 11 on 09/30/2015, 35 on 11/23/2015, 29 on 01/25/2016, 16 on 07/31/2016, 48 on 03/01/2017, and 19 on 10/18/2017.  She has a history of B12 deficiency and is on oral B12.  B12 was 1175 on 03/01/2017.  Folate was normal on 03/01/2017.  Work-up on 04/19/2015 revealed a hematocrit of 28.3, hemoglobin 8.7, MCV 62.2, platelets 240,000, WBC 5500 with an ANC of 3000.  Ferritin was 5 (low) with an iron saturation of 3% (low) and a TIBC of 539 (high).  B12 and folate were normal.  She has COPD and is on oxygen.  She has a greater than 30 pack year smoking history.  Chest CT angiogram on 04/01/2014 revealed no pulmonary embolism but a 4 mm left upper lobe pulmonary nodule.  Chest CT on 03/05/2015 revealed a stable 3 mm lingular nodule.  There were new ground-glass opacities in the right upper lobe, right middle lobe and right lower lobe suggestive of an infectious or inflammatory process. Chest CT on 07/30/2015 revealed a stable 3 mm LUL nodule (benign).  Symptomatically, she denies any new complaints.  Exam is stable.  Hemoglobin is 11.3.  Ferritin is  19.  Plan: 1.  Review labs from yesterday.  Iron stores are low. 2.  Iron deficiency anemia:  Venofer today then weekly x 2.  Encourage follow-up with GI [facility]. 3.  RTC in 3 months for labs (CBC with diff, ferritin). 4.  RTC in 6 months for MD assessment, labs (CBC with diff, ferritin-day before), and +/- Venofer.   Rosey Bath, MD  10/19/2017, 8:08 PM

## 2017-10-19 NOTE — Progress Notes (Signed)
No new changes noted today 

## 2017-10-26 ENCOUNTER — Inpatient Hospital Stay: Payer: PPO

## 2017-10-26 VITALS — BP 136/65 | HR 76 | Temp 96.9°F | Resp 18

## 2017-10-26 DIAGNOSIS — D509 Iron deficiency anemia, unspecified: Secondary | ICD-10-CM | POA: Diagnosis not present

## 2017-10-26 MED ORDER — IRON SUCROSE 20 MG/ML IV SOLN
200.0000 mg | Freq: Once | INTRAVENOUS | Status: AC
  Administered 2017-10-26: 200 mg via INTRAVENOUS
  Filled 2017-10-26: qty 10

## 2017-10-26 MED ORDER — SODIUM CHLORIDE 0.9 % IV SOLN
Freq: Once | INTRAVENOUS | Status: AC
  Administered 2017-10-26: 12:00:00 via INTRAVENOUS
  Filled 2017-10-26: qty 250

## 2017-11-02 ENCOUNTER — Inpatient Hospital Stay: Payer: PPO

## 2017-11-02 VITALS — BP 112/58 | HR 76 | Resp 18

## 2017-11-02 DIAGNOSIS — D509 Iron deficiency anemia, unspecified: Secondary | ICD-10-CM

## 2017-11-02 MED ORDER — SODIUM CHLORIDE 0.9 % IV SOLN
INTRAVENOUS | Status: DC
  Administered 2017-11-02: 14:00:00 via INTRAVENOUS
  Filled 2017-11-02: qty 250

## 2017-11-02 MED ORDER — IRON SUCROSE 20 MG/ML IV SOLN
200.0000 mg | Freq: Once | INTRAVENOUS | Status: AC
  Administered 2017-11-02: 200 mg via INTRAVENOUS
  Filled 2017-11-02: qty 10

## 2017-11-15 DIAGNOSIS — J432 Centrilobular emphysema: Secondary | ICD-10-CM | POA: Diagnosis not present

## 2017-11-15 DIAGNOSIS — J449 Chronic obstructive pulmonary disease, unspecified: Secondary | ICD-10-CM | POA: Diagnosis not present

## 2017-11-15 DIAGNOSIS — J439 Emphysema, unspecified: Secondary | ICD-10-CM | POA: Diagnosis not present

## 2017-11-26 DIAGNOSIS — K625 Hemorrhage of anus and rectum: Secondary | ICD-10-CM | POA: Diagnosis not present

## 2017-11-26 DIAGNOSIS — I5022 Chronic systolic (congestive) heart failure: Secondary | ICD-10-CM | POA: Diagnosis not present

## 2017-11-26 DIAGNOSIS — J449 Chronic obstructive pulmonary disease, unspecified: Secondary | ICD-10-CM | POA: Diagnosis not present

## 2017-11-26 DIAGNOSIS — Z86010 Personal history of colonic polyps: Secondary | ICD-10-CM | POA: Diagnosis not present

## 2017-11-26 DIAGNOSIS — K279 Peptic ulcer, site unspecified, unspecified as acute or chronic, without hemorrhage or perforation: Secondary | ICD-10-CM | POA: Diagnosis not present

## 2017-11-26 DIAGNOSIS — D508 Other iron deficiency anemias: Secondary | ICD-10-CM | POA: Diagnosis not present

## 2017-11-28 DIAGNOSIS — E785 Hyperlipidemia, unspecified: Secondary | ICD-10-CM | POA: Diagnosis not present

## 2017-11-28 DIAGNOSIS — R0609 Other forms of dyspnea: Secondary | ICD-10-CM | POA: Diagnosis not present

## 2017-11-28 DIAGNOSIS — I10 Essential (primary) hypertension: Secondary | ICD-10-CM | POA: Diagnosis not present

## 2017-11-28 DIAGNOSIS — I2510 Atherosclerotic heart disease of native coronary artery without angina pectoris: Secondary | ICD-10-CM | POA: Diagnosis not present

## 2017-11-28 DIAGNOSIS — I5022 Chronic systolic (congestive) heart failure: Secondary | ICD-10-CM | POA: Diagnosis not present

## 2017-11-28 DIAGNOSIS — J441 Chronic obstructive pulmonary disease with (acute) exacerbation: Secondary | ICD-10-CM | POA: Diagnosis not present

## 2017-11-28 DIAGNOSIS — Z01810 Encounter for preprocedural cardiovascular examination: Secondary | ICD-10-CM | POA: Diagnosis not present

## 2017-12-12 DIAGNOSIS — J449 Chronic obstructive pulmonary disease, unspecified: Secondary | ICD-10-CM | POA: Diagnosis not present

## 2017-12-12 DIAGNOSIS — K635 Polyp of colon: Secondary | ICD-10-CM | POA: Diagnosis not present

## 2017-12-12 DIAGNOSIS — D508 Other iron deficiency anemias: Secondary | ICD-10-CM | POA: Diagnosis not present

## 2017-12-12 DIAGNOSIS — G2581 Restless legs syndrome: Secondary | ICD-10-CM | POA: Diagnosis not present

## 2017-12-12 DIAGNOSIS — I10 Essential (primary) hypertension: Secondary | ICD-10-CM | POA: Diagnosis not present

## 2017-12-12 DIAGNOSIS — J309 Allergic rhinitis, unspecified: Secondary | ICD-10-CM | POA: Diagnosis not present

## 2017-12-12 DIAGNOSIS — I2510 Atherosclerotic heart disease of native coronary artery without angina pectoris: Secondary | ICD-10-CM | POA: Diagnosis not present

## 2017-12-12 DIAGNOSIS — I5022 Chronic systolic (congestive) heart failure: Secondary | ICD-10-CM | POA: Diagnosis not present

## 2017-12-15 DIAGNOSIS — J432 Centrilobular emphysema: Secondary | ICD-10-CM | POA: Diagnosis not present

## 2017-12-15 DIAGNOSIS — J449 Chronic obstructive pulmonary disease, unspecified: Secondary | ICD-10-CM | POA: Diagnosis not present

## 2017-12-15 DIAGNOSIS — J439 Emphysema, unspecified: Secondary | ICD-10-CM | POA: Diagnosis not present

## 2017-12-19 DIAGNOSIS — I2510 Atherosclerotic heart disease of native coronary artery without angina pectoris: Secondary | ICD-10-CM | POA: Diagnosis not present

## 2017-12-19 DIAGNOSIS — F325 Major depressive disorder, single episode, in full remission: Secondary | ICD-10-CM | POA: Diagnosis not present

## 2017-12-19 DIAGNOSIS — R0609 Other forms of dyspnea: Secondary | ICD-10-CM | POA: Diagnosis not present

## 2017-12-19 DIAGNOSIS — D638 Anemia in other chronic diseases classified elsewhere: Secondary | ICD-10-CM | POA: Diagnosis not present

## 2017-12-19 DIAGNOSIS — Z780 Asymptomatic menopausal state: Secondary | ICD-10-CM | POA: Diagnosis not present

## 2017-12-19 DIAGNOSIS — R7989 Other specified abnormal findings of blood chemistry: Secondary | ICD-10-CM | POA: Diagnosis not present

## 2017-12-19 DIAGNOSIS — Z9981 Dependence on supplemental oxygen: Secondary | ICD-10-CM | POA: Diagnosis not present

## 2017-12-19 DIAGNOSIS — J9611 Chronic respiratory failure with hypoxia: Secondary | ICD-10-CM | POA: Diagnosis not present

## 2017-12-19 DIAGNOSIS — Z23 Encounter for immunization: Secondary | ICD-10-CM | POA: Diagnosis not present

## 2017-12-19 DIAGNOSIS — J449 Chronic obstructive pulmonary disease, unspecified: Secondary | ICD-10-CM | POA: Diagnosis not present

## 2017-12-19 DIAGNOSIS — I5022 Chronic systolic (congestive) heart failure: Secondary | ICD-10-CM | POA: Diagnosis not present

## 2017-12-19 DIAGNOSIS — Z01818 Encounter for other preprocedural examination: Secondary | ICD-10-CM | POA: Diagnosis not present

## 2017-12-19 DIAGNOSIS — D508 Other iron deficiency anemias: Secondary | ICD-10-CM | POA: Diagnosis not present

## 2017-12-25 DIAGNOSIS — M8588 Other specified disorders of bone density and structure, other site: Secondary | ICD-10-CM | POA: Diagnosis not present

## 2018-01-15 DIAGNOSIS — J439 Emphysema, unspecified: Secondary | ICD-10-CM | POA: Diagnosis not present

## 2018-01-15 DIAGNOSIS — J432 Centrilobular emphysema: Secondary | ICD-10-CM | POA: Diagnosis not present

## 2018-01-15 DIAGNOSIS — J449 Chronic obstructive pulmonary disease, unspecified: Secondary | ICD-10-CM | POA: Diagnosis not present

## 2018-01-18 ENCOUNTER — Inpatient Hospital Stay: Payer: PPO | Attending: Hematology and Oncology

## 2018-01-18 DIAGNOSIS — D509 Iron deficiency anemia, unspecified: Secondary | ICD-10-CM | POA: Diagnosis not present

## 2018-01-18 LAB — CBC WITH DIFFERENTIAL/PLATELET
Abs Immature Granulocytes: 0.01 10*3/uL (ref 0.00–0.07)
Basophils Absolute: 0 10*3/uL (ref 0.0–0.1)
Basophils Relative: 1 %
Eosinophils Absolute: 0.1 10*3/uL (ref 0.0–0.5)
Eosinophils Relative: 2 %
HCT: 34.3 % — ABNORMAL LOW (ref 36.0–46.0)
Hemoglobin: 11.2 g/dL — ABNORMAL LOW (ref 12.0–15.0)
Immature Granulocytes: 0 %
Lymphocytes Relative: 33 %
Lymphs Abs: 1.7 10*3/uL (ref 0.7–4.0)
MCH: 29.1 pg (ref 26.0–34.0)
MCHC: 32.7 g/dL (ref 30.0–36.0)
MCV: 89.1 fL (ref 80.0–100.0)
Monocytes Absolute: 0.2 10*3/uL (ref 0.1–1.0)
Monocytes Relative: 5 %
Neutro Abs: 3 10*3/uL (ref 1.7–7.7)
Neutrophils Relative %: 59 %
Platelets: 206 10*3/uL (ref 150–400)
RBC: 3.85 MIL/uL — ABNORMAL LOW (ref 3.87–5.11)
RDW: 13.1 % (ref 11.5–15.5)
WBC: 5 10*3/uL (ref 4.0–10.5)
nRBC: 0 % (ref 0.0–0.2)

## 2018-01-18 LAB — FERRITIN: Ferritin: 63 ng/mL (ref 11–307)

## 2018-01-24 DIAGNOSIS — I2510 Atherosclerotic heart disease of native coronary artery without angina pectoris: Secondary | ICD-10-CM | POA: Diagnosis not present

## 2018-01-24 DIAGNOSIS — J449 Chronic obstructive pulmonary disease, unspecified: Secondary | ICD-10-CM | POA: Diagnosis not present

## 2018-01-24 DIAGNOSIS — D510 Vitamin B12 deficiency anemia due to intrinsic factor deficiency: Secondary | ICD-10-CM | POA: Diagnosis not present

## 2018-01-24 DIAGNOSIS — R05 Cough: Secondary | ICD-10-CM | POA: Diagnosis not present

## 2018-01-24 DIAGNOSIS — J209 Acute bronchitis, unspecified: Secondary | ICD-10-CM | POA: Diagnosis not present

## 2018-01-24 DIAGNOSIS — G2581 Restless legs syndrome: Secondary | ICD-10-CM | POA: Diagnosis not present

## 2018-01-24 DIAGNOSIS — J440 Chronic obstructive pulmonary disease with acute lower respiratory infection: Secondary | ICD-10-CM | POA: Diagnosis not present

## 2018-01-24 DIAGNOSIS — D508 Other iron deficiency anemias: Secondary | ICD-10-CM | POA: Diagnosis not present

## 2018-01-24 DIAGNOSIS — Z86010 Personal history of colonic polyps: Secondary | ICD-10-CM | POA: Diagnosis not present

## 2018-01-24 DIAGNOSIS — E785 Hyperlipidemia, unspecified: Secondary | ICD-10-CM | POA: Diagnosis not present

## 2018-01-24 DIAGNOSIS — I5022 Chronic systolic (congestive) heart failure: Secondary | ICD-10-CM | POA: Diagnosis not present

## 2018-01-24 DIAGNOSIS — J9611 Chronic respiratory failure with hypoxia: Secondary | ICD-10-CM | POA: Diagnosis not present

## 2018-01-24 DIAGNOSIS — J432 Centrilobular emphysema: Secondary | ICD-10-CM | POA: Diagnosis not present

## 2018-02-15 DIAGNOSIS — J439 Emphysema, unspecified: Secondary | ICD-10-CM | POA: Diagnosis not present

## 2018-02-15 DIAGNOSIS — J449 Chronic obstructive pulmonary disease, unspecified: Secondary | ICD-10-CM | POA: Diagnosis not present

## 2018-02-15 DIAGNOSIS — J432 Centrilobular emphysema: Secondary | ICD-10-CM | POA: Diagnosis not present

## 2018-03-16 DIAGNOSIS — J432 Centrilobular emphysema: Secondary | ICD-10-CM | POA: Diagnosis not present

## 2018-03-16 DIAGNOSIS — J439 Emphysema, unspecified: Secondary | ICD-10-CM | POA: Diagnosis not present

## 2018-03-16 DIAGNOSIS — J449 Chronic obstructive pulmonary disease, unspecified: Secondary | ICD-10-CM | POA: Diagnosis not present

## 2018-04-02 DIAGNOSIS — I2510 Atherosclerotic heart disease of native coronary artery without angina pectoris: Secondary | ICD-10-CM | POA: Diagnosis not present

## 2018-04-16 DIAGNOSIS — J449 Chronic obstructive pulmonary disease, unspecified: Secondary | ICD-10-CM | POA: Diagnosis not present

## 2018-04-16 DIAGNOSIS — J439 Emphysema, unspecified: Secondary | ICD-10-CM | POA: Diagnosis not present

## 2018-04-16 DIAGNOSIS — J432 Centrilobular emphysema: Secondary | ICD-10-CM | POA: Diagnosis not present

## 2018-04-18 ENCOUNTER — Inpatient Hospital Stay: Payer: PPO | Attending: Oncology

## 2018-04-19 ENCOUNTER — Inpatient Hospital Stay: Payer: PPO

## 2018-04-19 ENCOUNTER — Ambulatory Visit: Payer: PPO | Admitting: Hematology and Oncology

## 2018-04-19 ENCOUNTER — Ambulatory Visit: Payer: PPO

## 2018-04-19 ENCOUNTER — Inpatient Hospital Stay: Payer: PPO | Admitting: Oncology

## 2018-04-19 DIAGNOSIS — J432 Centrilobular emphysema: Secondary | ICD-10-CM | POA: Diagnosis not present

## 2018-04-19 DIAGNOSIS — J439 Emphysema, unspecified: Secondary | ICD-10-CM | POA: Diagnosis not present

## 2018-04-19 DIAGNOSIS — J449 Chronic obstructive pulmonary disease, unspecified: Secondary | ICD-10-CM | POA: Diagnosis not present

## 2018-05-16 DIAGNOSIS — J449 Chronic obstructive pulmonary disease, unspecified: Secondary | ICD-10-CM | POA: Diagnosis not present

## 2018-05-16 DIAGNOSIS — J432 Centrilobular emphysema: Secondary | ICD-10-CM | POA: Diagnosis not present

## 2018-05-16 DIAGNOSIS — J439 Emphysema, unspecified: Secondary | ICD-10-CM | POA: Diagnosis not present

## 2018-05-20 DIAGNOSIS — I5022 Chronic systolic (congestive) heart failure: Secondary | ICD-10-CM | POA: Diagnosis not present

## 2018-05-20 DIAGNOSIS — F325 Major depressive disorder, single episode, in full remission: Secondary | ICD-10-CM | POA: Diagnosis not present

## 2018-05-20 DIAGNOSIS — Z9981 Dependence on supplemental oxygen: Secondary | ICD-10-CM | POA: Diagnosis not present

## 2018-05-20 DIAGNOSIS — R7989 Other specified abnormal findings of blood chemistry: Secondary | ICD-10-CM | POA: Diagnosis not present

## 2018-05-20 DIAGNOSIS — I2510 Atherosclerotic heart disease of native coronary artery without angina pectoris: Secondary | ICD-10-CM | POA: Diagnosis not present

## 2018-05-20 DIAGNOSIS — J9611 Chronic respiratory failure with hypoxia: Secondary | ICD-10-CM | POA: Diagnosis not present

## 2018-05-20 DIAGNOSIS — R0600 Dyspnea, unspecified: Secondary | ICD-10-CM | POA: Diagnosis not present

## 2018-05-20 DIAGNOSIS — D508 Other iron deficiency anemias: Secondary | ICD-10-CM | POA: Diagnosis not present

## 2018-05-20 DIAGNOSIS — J449 Chronic obstructive pulmonary disease, unspecified: Secondary | ICD-10-CM | POA: Diagnosis not present

## 2018-05-20 DIAGNOSIS — R8290 Unspecified abnormal findings in urine: Secondary | ICD-10-CM | POA: Diagnosis not present

## 2018-05-20 DIAGNOSIS — Z780 Asymptomatic menopausal state: Secondary | ICD-10-CM | POA: Diagnosis not present

## 2018-05-27 ENCOUNTER — Other Ambulatory Visit: Payer: Self-pay | Admitting: *Deleted

## 2018-05-27 DIAGNOSIS — D509 Iron deficiency anemia, unspecified: Secondary | ICD-10-CM

## 2018-05-28 ENCOUNTER — Other Ambulatory Visit: Payer: Self-pay

## 2018-05-29 ENCOUNTER — Encounter (INDEPENDENT_AMBULATORY_CARE_PROVIDER_SITE_OTHER): Payer: Self-pay

## 2018-05-29 ENCOUNTER — Inpatient Hospital Stay: Payer: PPO | Attending: Oncology

## 2018-05-29 ENCOUNTER — Other Ambulatory Visit: Payer: Self-pay

## 2018-05-29 DIAGNOSIS — D509 Iron deficiency anemia, unspecified: Secondary | ICD-10-CM | POA: Diagnosis not present

## 2018-05-29 LAB — CBC WITH DIFFERENTIAL/PLATELET
Abs Immature Granulocytes: 0.02 10*3/uL (ref 0.00–0.07)
Basophils Absolute: 0 10*3/uL (ref 0.0–0.1)
Basophils Relative: 1 %
Eosinophils Absolute: 0.1 10*3/uL (ref 0.0–0.5)
Eosinophils Relative: 2 %
HCT: 33.9 % — ABNORMAL LOW (ref 36.0–46.0)
Hemoglobin: 11.1 g/dL — ABNORMAL LOW (ref 12.0–15.0)
Immature Granulocytes: 0 %
Lymphocytes Relative: 39 %
Lymphs Abs: 1.8 10*3/uL (ref 0.7–4.0)
MCH: 29.4 pg (ref 26.0–34.0)
MCHC: 32.7 g/dL (ref 30.0–36.0)
MCV: 89.9 fL (ref 80.0–100.0)
Monocytes Absolute: 0.4 10*3/uL (ref 0.1–1.0)
Monocytes Relative: 9 %
Neutro Abs: 2.3 10*3/uL (ref 1.7–7.7)
Neutrophils Relative %: 49 %
Platelets: 228 10*3/uL (ref 150–400)
RBC: 3.77 MIL/uL — ABNORMAL LOW (ref 3.87–5.11)
RDW: 13.2 % (ref 11.5–15.5)
WBC: 4.6 10*3/uL (ref 4.0–10.5)
nRBC: 0 % (ref 0.0–0.2)

## 2018-05-29 LAB — FERRITIN: Ferritin: 66 ng/mL (ref 11–307)

## 2018-05-30 ENCOUNTER — Encounter: Payer: Self-pay | Admitting: Oncology

## 2018-05-30 ENCOUNTER — Inpatient Hospital Stay: Payer: PPO

## 2018-05-30 ENCOUNTER — Inpatient Hospital Stay (HOSPITAL_BASED_OUTPATIENT_CLINIC_OR_DEPARTMENT_OTHER): Payer: PPO | Admitting: Oncology

## 2018-05-30 ENCOUNTER — Encounter: Payer: Self-pay | Admitting: Hematology and Oncology

## 2018-05-30 ENCOUNTER — Ambulatory Visit: Payer: PPO | Admitting: Oncology

## 2018-05-30 VITALS — BP 121/62 | HR 70

## 2018-05-30 DIAGNOSIS — Z87891 Personal history of nicotine dependence: Secondary | ICD-10-CM | POA: Diagnosis not present

## 2018-05-30 DIAGNOSIS — Z79899 Other long term (current) drug therapy: Secondary | ICD-10-CM | POA: Diagnosis not present

## 2018-05-30 DIAGNOSIS — E538 Deficiency of other specified B group vitamins: Secondary | ICD-10-CM

## 2018-05-30 DIAGNOSIS — D509 Iron deficiency anemia, unspecified: Secondary | ICD-10-CM | POA: Diagnosis not present

## 2018-05-30 NOTE — Progress Notes (Signed)
Patient contacted for telehealth visit. No concerns voiced.  

## 2018-05-30 NOTE — Progress Notes (Signed)
HEMATOLOGY-ONCOLOGY TeleHEALTH VISIT PROGRESS NOTE  I connected with Tanya Rhodes on 05/30/18 at  8:45 AM EDT by video enabled telemedicine visit and verified that I am speaking with the correct person using two identifiers. I discussed the limitations, risks, security and privacy concerns of performing an evaluation and management service by telemedicine and the availability of in-person appointments. I also discussed with the patient that there may be a patient responsible charge related to this service. The patient expressed understanding and agreed to proceed.   I attempted to connect the patient for visual enabled telehealth visit via Webex.  Due to the technical difficulties with video,  Patient was transitioned to audio only visit.  Other persons participating in the visit and their role in the encounter:  Merleen NicelyElizabeth Santos, RN, check in patient.   Patient's location: Home  Provider's location: work Stage managerChief Complaint: Follow-up for management of iron deficiency anemia.   INTERVAL HISTORY Tanya Rhodes is a 78 y.o. female who has above history reviewed by me today presents for follow up visit for management of iron deficiency anemia Patient previously followed up with Dr. Merlene Pullingorcoran who [facility]   Patient switched care to me on 05/30/2018.Extensive chart review was performed by me. Patient had a history of gastric ulcer years ago and a colonic polyp status post partial colectomy in 2013.  Colonoscopy on 05/15/2013 reviewed patent ileocolonic anastomosis and internal hemorrhoids.  EGD on 05/15/2013 was normal.  Patient has iron deficiency and has been receiving IV Venofer if ferritin is less than 30. History of B12 deficiency is taking oral B12 supplementation.  COPD is on oxygen.  Smoking history greater than 30-pack-year.  CT chest angiogram 04/01/2014 shows no pulmonary embolism but a 4 mm left upper lobe pulmonary nodule.  Chest CT to 03/04/18 17 revealed a stable 3 mm  lingular nodule.  There were new groundglass opacities in the right upper lobe.  Right middle lobe and right lower lobe suggesting infectious or inflammatory process. CT chest 07/30/2015 revealed a stable 3 mm left lower lobe nodule. Patient reports that she is feeding well at baseline.  Chronic fatigue no change.  Not worsened. No new complaints today. She had [facility] Dr[facility] Mechele CollinElliott.   Review of Systems  Constitutional: Positive for fatigue. Negative for appetite change, chills and fever.  HENT:   Negative for hearing loss and voice change.   Eyes: Negative for eye problems.  Respiratory: Negative for chest tightness and cough.   Cardiovascular: Negative for chest pain.  Gastrointestinal: Negative for abdominal distention, abdominal pain and blood in stool.  Endocrine: Negative for hot flashes.  Genitourinary: Negative for difficulty urinating and frequency.   Musculoskeletal: Negative for arthralgias.  Skin: Negative for itching and rash.  Neurological: Negative for extremity weakness.  Hematological: Negative for adenopathy.  Psychiatric/Behavioral: Negative for confusion.    Past Medical History:  Diagnosis Date  . Allergic rhinitis   . Centrilobular emphysema (HCC)   . Chronic systolic CHF (congestive heart failure) (HCC)   . Colon polyposis   . COPD (chronic obstructive pulmonary disease) (HCC)   . Coronary artery disease involving native coronary artery of native heart without angina pectoris   . DDD (degenerative disc disease), lumbar   . Essential hypertension   . HLD (hyperlipidemia)   . Hypertension   . IDA (iron deficiency anemia)   . Low serum vitamin D   . Lumbar radiculitis   . Lumbar spondylitis (HCC)   .  Lumbar stenosis with neurogenic claudication   . Melena   . PUD (peptic ulcer disease)   . Restless leg   . Rotator cuff rupture   . VHD (valvular heart disease)    Past Surgical History:  Procedure Laterality  Date  . BREAST BIOPSY Right 11/20/11   neg [place] core  . COLECTOMY    . CORONARY ANGIOPLASTY WITH STENT PLACEMENT      Family History  Problem Relation Age of Onset  . Heart disease Brother   . Heart disease Sister     Social History   Socioeconomic History  . Marital status: Widowed    Spouse name: Not on file  . Number of children: Not on file  . Years of education: Not on file  . Highest education level: Not on file  Occupational History  . Not on file  Social Needs  . Financial resource strain: Not on file  . Food insecurity:    Worry: Not on file    Inability: Not on file  . Transportation needs:    Medical: Not on file    Non-medical: Not on file  Tobacco Use  . Smoking status: Former Games developer  . Smokeless tobacco: Never Used  Substance and Sexual Activity  . Alcohol use: No    Alcohol/week: 0.0 standard drinks  . Drug use: No  . Sexual activity: Not on file  Lifestyle  . Physical activity:    Days per week: Not on file    Minutes per session: Not on file  . Stress: Not on file  Relationships  . Social connections:    Talks on phone: Not on file    Gets together: Not on file    Attends religious service: Not on file    Active member of club or organization: Not on file    Attends meetings of clubs or organizations: Not on file    Relationship status: Not on file  . Intimate partner violence:    Fear of current or ex partner: Not on file    Emotionally abused: Not on file    Physically abused: Not on file    Forced sexual activity: Not on file  Other Topics Concern  . Not on file  Social History Narrative  . Not on file    Current Outpatient Medications on File Prior to Visit  Medication Sig Dispense Refill  . acetaminophen (TYLENOL) 500 MG tablet Take 500 mg by mouth every 6 (six) hours as needed for mild pain, moderate pain, fever or headache.     . albuterol (PROVENTIL HFA;VENTOLIN HFA) 108 (90 Base) MCG/ACT inhaler Inhale 2 puffs into the lungs every  4 (four) hours as needed for wheezing or shortness of breath.     Marland Kitchen albuterol (PROVENTIL) (2.5 MG/3ML) 0.083% nebulizer solution Take 2.5 mg by nebulization every 6 (six) hours as needed for wheezing or shortness of breath.    . ALPRAZolam (XANAX) 0.25 MG tablet Take 0.25 mg by mouth at bedtime as needed for anxiety or sleep.    . budesonide-formoterol (SYMBICORT) 160-4.5 MCG/ACT inhaler Inhale 2 puffs into the lungs 2 (two) times daily.    . calcium citrate-vitamin D (CITRACAL+D) 315-200 MG-UNIT tablet Take 1 tablet by mouth daily.    . cyanocobalamin 1000 MCG tablet Take 1,000 mcg by mouth daily.    Marland Kitchen escitalopram (LEXAPRO) 10 MG tablet Take 10 mg by mouth daily.  0  . famotidine (PEPCID) 20 MG tablet Take 20 mg by mouth 2 (two) times  a day.    . fluticasone (FLONASE) 50 MCG/ACT nasal spray Place 2 sprays into both nostrils daily.    . furosemide (LASIX) 20 MG tablet Take 20 mg by mouth daily.    Marland Kitchen lisinopril (PRINIVIL,ZESTRIL) 2.5 MG tablet Take 2.5 mg by mouth daily.    . metoprolol succinate (TOPROL-XL) 25 MG 24 hr tablet Take 25 mg by mouth daily.  1  . simvastatin (ZOCOR) 20 MG tablet Take 20 mg by mouth at bedtime.    Marland Kitchen SPIRIVA RESPIMAT 2.5 MCG/ACT AERS Inhale 2 puffs into the lungs daily.   0  . traMADol (ULTRAM) 50 MG tablet Take 75 mg by mouth 2 (two) times daily as needed for moderate pain.     . Vitamin D, Ergocalciferol, (DRISDOL) 50000 units CAPS capsule Take 50,000 Units by mouth every 7 (seven) days. Pt takes on Saturday.    . gabapentin (NEURONTIN) 300 MG capsule Take 600 mg by mouth at bedtime.    . ranitidine (ZANTAC) 150 MG tablet Take 150 mg by mouth 2 (two) times daily.     No current facility-administered medications on file prior to visit.     Allergies  Allergen Reactions  . Ropinirole Anxiety and Other (See Comments)    Reaction:  Severe leg pain   . Aspirin Other (See Comments)    GIB  . Ferrous Fumarate Itching  . Ferrous Sulfate Diarrhea and Nausea And  Vomiting  . Omeprazole Nausea And Vomiting  . Oxycodone Other (See Comments)    Reaction:  Makes pt hyper   . Prednisone Other (See Comments)    Reaction:  Makes pt hyper        Observations/Objective: Today's Vitals   05/30/18 0836  BP: 121/62  Pulse: 70  PainSc: 0-No pain   There is no height or weight on file to calculate BMI.  Physical Exam  Constitutional: She is oriented to person, place, and time.  Neurological: She is alert and oriented to person, place, and time.    CBC    Component Value Date/Time   WBC 4.6 05/29/2018 1153   RBC 3.77 (L) 05/29/2018 1153   HGB 11.1 (L) 05/29/2018 1153   HGB 10.9 (L) 04/01/2014 1619   HCT 33.9 (L) 05/29/2018 1153   HCT 35.5 04/01/2014 1619   PLT 228 05/29/2018 1153   PLT 334 04/01/2014 1619   MCV 89.9 05/29/2018 1153   MCV 75 (L) 04/01/2014 1619   MCH 29.4 05/29/2018 1153   MCHC 32.7 05/29/2018 1153   RDW 13.2 05/29/2018 1153   RDW 16.3 (H) 04/01/2014 1619   LYMPHSABS 1.8 05/29/2018 1153   LYMPHSABS 1.0 10/30/2013 0423   MONOABS 0.4 05/29/2018 1153   MONOABS 0.2 10/30/2013 0423   EOSABS 0.1 05/29/2018 1153   EOSABS 0.0 10/30/2013 0423   BASOSABS 0.0 05/29/2018 1153   BASOSABS 0.0 10/30/2013 0423    CMP     Component Value Date/Time   NA 136 08/05/2015 1853   NA 137 04/01/2014 1619   K 3.4 (L) 08/05/2015 1853   K 3.5 04/01/2014 1619   CL 98 (L) 08/05/2015 1853   CL 102 04/01/2014 1619   CO2 28 08/05/2015 1853   CO2 23 04/01/2014 1619   GLUCOSE 128 (H) 08/05/2015 1853   GLUCOSE 129 (H) 04/01/2014 1619   BUN 16 08/05/2015 1853   BUN 14 04/01/2014 1619   CREATININE 1.20 (H) 08/05/2015 1853   CREATININE 0.94 04/01/2014 1619   CALCIUM 9.3 08/05/2015 1853   CALCIUM  9.4 04/01/2014 1619   PROT 7.3 04/01/2014 1619   ALBUMIN 4.5 04/01/2014 1619   AST 24 04/01/2014 1619   ALT 18 04/01/2014 1619   ALKPHOS 75 04/01/2014 1619   BILITOT 0.4 04/01/2014 1619   GFRNONAA 43 (L) 08/05/2015 1853   GFRNONAA >60 04/01/2014  1619   GFRAA 50 (L) 08/05/2015 1853   GFRAA >60 04/01/2014 1619     Assessment and Plan: 1. B12 deficiency   2. Iron deficiency anemia, unspecified iron deficiency anemia type   Labs are reviewed and discussed with patient. Iron deficiency anemia, hemoglobin 11.1, stable Iron panel showed ferritin level 66, stable.  No need for additional IV Venofer at this point. Vitamin B12 deficiency, she has been taking vitamin B-12 oral supplementation.  B12 was not checked today.  Will check next visit.  Follow Up Instructions: Lab MD assessment possible Venofer in 3 months.  Labs 1 to 2 days earlier.   I discussed the assessment and treatment plan with the patient. The patient was provided an opportunity to ask questions and all were answered. The patient agreed with the plan and demonstrated an understanding of the instructions.  The patient was advised to call back or seek an in-person evaluation if the symptoms worsen or if the condition fails to improve as anticipated.   Rickard Patience, MD 05/30/2018 11:45 AM

## 2018-06-16 DIAGNOSIS — J432 Centrilobular emphysema: Secondary | ICD-10-CM | POA: Diagnosis not present

## 2018-06-16 DIAGNOSIS — J449 Chronic obstructive pulmonary disease, unspecified: Secondary | ICD-10-CM | POA: Diagnosis not present

## 2018-06-16 DIAGNOSIS — J439 Emphysema, unspecified: Secondary | ICD-10-CM | POA: Diagnosis not present

## 2018-06-27 DIAGNOSIS — H52223 Regular astigmatism, bilateral: Secondary | ICD-10-CM | POA: Diagnosis not present

## 2018-06-27 DIAGNOSIS — H02889 Meibomian gland dysfunction of unspecified eye, unspecified eyelid: Secondary | ICD-10-CM | POA: Diagnosis not present

## 2018-06-27 DIAGNOSIS — Z961 Presence of intraocular lens: Secondary | ICD-10-CM | POA: Diagnosis not present

## 2018-06-27 DIAGNOSIS — H5203 Hypermetropia, bilateral: Secondary | ICD-10-CM | POA: Diagnosis not present

## 2018-06-27 DIAGNOSIS — H47323 Drusen of optic disc, bilateral: Secondary | ICD-10-CM | POA: Diagnosis not present

## 2018-07-16 DIAGNOSIS — J449 Chronic obstructive pulmonary disease, unspecified: Secondary | ICD-10-CM | POA: Diagnosis not present

## 2018-07-16 DIAGNOSIS — J439 Emphysema, unspecified: Secondary | ICD-10-CM | POA: Diagnosis not present

## 2018-07-16 DIAGNOSIS — J432 Centrilobular emphysema: Secondary | ICD-10-CM | POA: Diagnosis not present

## 2018-08-16 DIAGNOSIS — J449 Chronic obstructive pulmonary disease, unspecified: Secondary | ICD-10-CM | POA: Diagnosis not present

## 2018-08-16 DIAGNOSIS — J439 Emphysema, unspecified: Secondary | ICD-10-CM | POA: Diagnosis not present

## 2018-08-16 DIAGNOSIS — J432 Centrilobular emphysema: Secondary | ICD-10-CM | POA: Diagnosis not present

## 2018-08-23 ENCOUNTER — Other Ambulatory Visit: Payer: Self-pay

## 2018-08-23 ENCOUNTER — Inpatient Hospital Stay: Payer: PPO | Attending: Oncology

## 2018-08-23 DIAGNOSIS — Z79899 Other long term (current) drug therapy: Secondary | ICD-10-CM | POA: Insufficient documentation

## 2018-08-23 DIAGNOSIS — D509 Iron deficiency anemia, unspecified: Secondary | ICD-10-CM | POA: Insufficient documentation

## 2018-08-23 DIAGNOSIS — Z9981 Dependence on supplemental oxygen: Secondary | ICD-10-CM | POA: Diagnosis not present

## 2018-08-23 DIAGNOSIS — Z86010 Personal history of colonic polyps: Secondary | ICD-10-CM | POA: Diagnosis not present

## 2018-08-23 DIAGNOSIS — Z9049 Acquired absence of other specified parts of digestive tract: Secondary | ICD-10-CM | POA: Diagnosis not present

## 2018-08-23 DIAGNOSIS — I110 Hypertensive heart disease with heart failure: Secondary | ICD-10-CM | POA: Insufficient documentation

## 2018-08-23 DIAGNOSIS — Z87891 Personal history of nicotine dependence: Secondary | ICD-10-CM | POA: Insufficient documentation

## 2018-08-23 DIAGNOSIS — E785 Hyperlipidemia, unspecified: Secondary | ICD-10-CM | POA: Diagnosis not present

## 2018-08-23 DIAGNOSIS — I2510 Atherosclerotic heart disease of native coronary artery without angina pectoris: Secondary | ICD-10-CM | POA: Diagnosis not present

## 2018-08-23 DIAGNOSIS — I5022 Chronic systolic (congestive) heart failure: Secondary | ICD-10-CM | POA: Diagnosis not present

## 2018-08-23 DIAGNOSIS — J439 Emphysema, unspecified: Secondary | ICD-10-CM | POA: Insufficient documentation

## 2018-08-23 DIAGNOSIS — Z8711 Personal history of peptic ulcer disease: Secondary | ICD-10-CM | POA: Diagnosis not present

## 2018-08-23 DIAGNOSIS — J9610 Chronic respiratory failure, unspecified whether with hypoxia or hypercapnia: Secondary | ICD-10-CM | POA: Diagnosis not present

## 2018-08-23 DIAGNOSIS — K648 Other hemorrhoids: Secondary | ICD-10-CM | POA: Diagnosis not present

## 2018-08-23 DIAGNOSIS — R918 Other nonspecific abnormal finding of lung field: Secondary | ICD-10-CM | POA: Insufficient documentation

## 2018-08-23 DIAGNOSIS — M5136 Other intervertebral disc degeneration, lumbar region: Secondary | ICD-10-CM | POA: Diagnosis not present

## 2018-08-23 DIAGNOSIS — G2581 Restless legs syndrome: Secondary | ICD-10-CM | POA: Diagnosis not present

## 2018-08-23 DIAGNOSIS — K921 Melena: Secondary | ICD-10-CM | POA: Insufficient documentation

## 2018-08-23 DIAGNOSIS — J449 Chronic obstructive pulmonary disease, unspecified: Secondary | ICD-10-CM | POA: Diagnosis not present

## 2018-08-23 DIAGNOSIS — M48062 Spinal stenosis, lumbar region with neurogenic claudication: Secondary | ICD-10-CM | POA: Diagnosis not present

## 2018-08-23 DIAGNOSIS — E538 Deficiency of other specified B group vitamins: Secondary | ICD-10-CM | POA: Diagnosis not present

## 2018-08-23 LAB — CBC WITH DIFFERENTIAL/PLATELET
Abs Immature Granulocytes: 0.01 10*3/uL (ref 0.00–0.07)
Basophils Absolute: 0 10*3/uL (ref 0.0–0.1)
Basophils Relative: 1 %
Eosinophils Absolute: 0 10*3/uL (ref 0.0–0.5)
Eosinophils Relative: 1 %
HCT: 35.7 % — ABNORMAL LOW (ref 36.0–46.0)
Hemoglobin: 11.9 g/dL — ABNORMAL LOW (ref 12.0–15.0)
Immature Granulocytes: 0 %
Lymphocytes Relative: 29 %
Lymphs Abs: 1.7 10*3/uL (ref 0.7–4.0)
MCH: 29.6 pg (ref 26.0–34.0)
MCHC: 33.3 g/dL (ref 30.0–36.0)
MCV: 88.8 fL (ref 80.0–100.0)
Monocytes Absolute: 0.5 10*3/uL (ref 0.1–1.0)
Monocytes Relative: 8 %
Neutro Abs: 3.5 10*3/uL (ref 1.7–7.7)
Neutrophils Relative %: 61 %
Platelets: 247 10*3/uL (ref 150–400)
RBC: 4.02 MIL/uL (ref 3.87–5.11)
RDW: 12.4 % (ref 11.5–15.5)
WBC: 5.7 10*3/uL (ref 4.0–10.5)
nRBC: 0 % (ref 0.0–0.2)

## 2018-08-23 LAB — COMPREHENSIVE METABOLIC PANEL
ALT: 21 U/L (ref 0–44)
AST: 18 U/L (ref 15–41)
Albumin: 4.1 g/dL (ref 3.5–5.0)
Alkaline Phosphatase: 61 U/L (ref 38–126)
Anion gap: 8 (ref 5–15)
BUN: 19 mg/dL (ref 8–23)
CO2: 29 mmol/L (ref 22–32)
Calcium: 9.7 mg/dL (ref 8.9–10.3)
Chloride: 98 mmol/L (ref 98–111)
Creatinine, Ser: 0.87 mg/dL (ref 0.44–1.00)
GFR calc Af Amer: >60 mL/min (ref >60)
GFR calc non Af Amer: >60 mL/min (ref >60)
Glucose, Bld: 94 mg/dL (ref 70–99)
Potassium: 3.8 mmol/L (ref 3.5–5.1)
Sodium: 135 mmol/L (ref 135–145)
Total Bilirubin: 0.5 mg/dL (ref 0.3–1.2)
Total Protein: 6.4 g/dL — ABNORMAL LOW (ref 6.5–8.1)

## 2018-08-23 LAB — IRON AND TIBC
Iron: 65 ug/dL (ref 28–170)
Saturation Ratios: 18 % (ref 10.4–31.8)
TIBC: 357 ug/dL (ref 250–450)
UIBC: 292 ug/dL

## 2018-08-23 LAB — VITAMIN B12: Vitamin B-12: 768 pg/mL (ref 180–914)

## 2018-08-23 LAB — FERRITIN: Ferritin: 41 ng/mL (ref 11–307)

## 2018-08-26 ENCOUNTER — Encounter: Payer: Self-pay | Admitting: Oncology

## 2018-08-26 ENCOUNTER — Inpatient Hospital Stay (HOSPITAL_BASED_OUTPATIENT_CLINIC_OR_DEPARTMENT_OTHER): Payer: PPO | Admitting: Oncology

## 2018-08-26 ENCOUNTER — Inpatient Hospital Stay: Payer: PPO

## 2018-08-26 ENCOUNTER — Other Ambulatory Visit: Payer: Self-pay

## 2018-08-26 VITALS — BP 145/54 | HR 91 | Temp 98.0°F | Resp 24 | Ht 60.0 in | Wt 110.7 lb

## 2018-08-26 DIAGNOSIS — E538 Deficiency of other specified B group vitamins: Secondary | ICD-10-CM | POA: Diagnosis not present

## 2018-08-26 DIAGNOSIS — D509 Iron deficiency anemia, unspecified: Secondary | ICD-10-CM | POA: Diagnosis not present

## 2018-08-26 DIAGNOSIS — Z9049 Acquired absence of other specified parts of digestive tract: Secondary | ICD-10-CM | POA: Diagnosis not present

## 2018-08-26 NOTE — Progress Notes (Signed)
Tanya Rhodes. [facility]-  Cancer Center  Clinic day:  10/19/17  Chief Complaint: Tanya Rhodes is a 78 y.o. female with a history of iron deficiency anemia present for follow-up.  PERTINENT HEMATOLOGY HISTORY Patient was previously followed by Dr. Merlene Pullingorcoran.   Patient establish care with me on 05/30/2018 when she had a virtual visit with me. Extensive chart review was performed by me.  Patient had a history of gastric ulcer years ago and a colonic polyp status post partial colectomy in 2013.  Colonoscopy on 05/15/2013 reviewed patent ileocolonic anastomosis and internal hemorrhoids.  EGD on 05/15/2013 was normal.  Patient has iron deficiency and has been receiving IV Venofer if ferritin is less than 30. History of B12 deficiency is taking oral B12 supplementation.  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  Smoking history greater than 30-pack-year.  CT chest angiogram 04/01/2014 shows no pulmonary embolism but a 4 mm left upper lobe pulmonary nodule.  Chest CT to 03/04/18 17 revealed a stable 3 mm lingular nodule.  There were new groundglass opacities in the right upper lobe.  Right middle lobe and right lower lobe suggesting infectious or inflammatory process. CT chest 07/30/2015 revealed a stable 3 mm left lower lobe nodule. [facility] Dr. Mechele CollinElliott.  INTERVAL HISTORY Tanya Rhodes is a 78 y.o. female who has above history reviewed by me today presents for follow up visit for management of history of anemia. Problems and complaints are listed below: Patient reports feeling well at baseline today.  No new complaints.  Chronic fatigue has not changed.   Past Medical History:  Diagnosis Date  . Allergic rhinitis   . Centrilobular emphysema (HCC)   . Chronic systolic CHF (congestive heart failure) (HCC)   . Colon polyposis   . COPD (chronic obstructive pulmonary disease) (HCC)   . Coronary artery disease involving native coronary  artery of native heart without angina pectoris   . DDD (degenerative disc disease), lumbar   . Essential hypertension   . HLD (hyperlipidemia)   . Hypertension   . IDA (iron deficiency anemia)   . Low serum vitamin D   . Lumbar radiculitis   . Lumbar spondylitis (HCC)   . Lumbar stenosis with neurogenic claudication   . Melena   . PUD (peptic ulcer disease)   . Restless leg   . Rotator cuff rupture   . VHD (valvular heart disease)     Past Surgical History:  Procedure Laterality Date  . BREAST BIOPSY Right 11/20/11   neg [place] core  . COLECTOMY    . CORONARY ANGIOPLASTY WITH STENT PLACEMENT      Family History  Problem Relation Age of Onset  . Heart disease Brother   . Heart disease Sister     Social History:  reports that she has quit smoking. She has never used smokeless tobacco. She reports that she does not drink alcohol or use drugs.  She has a greater than 30 pack year smoking history.  She quit smoking in 1995.  The patient's son can not bring her to clinic on Tuesdays and Thursdays.  [place] in [place].    Allergies:  Allergies  Allergen Reactions  . Ropinirole Anxiety and Other (See Comments)    Reaction:  Severe leg pain   . Aspirin Other (See Comments)    GIB  . Ferrous Fumarate Itching  . Ferrous Sulfate Diarrhea and Nausea And Vomiting  . Omeprazole Nausea And Vomiting  . Oxycodone Other (  See Comments)    Reaction:  Makes pt hyper   . Prednisone Other (See Comments)    Reaction:  Makes pt hyper     Current Medications: Current Outpatient Medications  Medication Sig Dispense Refill  . acetaminophen (TYLENOL) 500 MG tablet Take 500 mg by mouth every 6 (six) hours as needed for mild pain, moderate pain, fever or headache.     . albuterol (PROVENTIL HFA;VENTOLIN HFA) 108 (90 Base) MCG/ACT inhaler Inhale 2 puffs into the lungs every 4 (four) hours as needed for wheezing or shortness of breath.     Marland Kitchen. albuterol (PROVENTIL) (2.5 MG/3ML) 0.083% nebulizer  solution Take 2.5 mg by nebulization every 6 (six) hours as needed for wheezing or shortness of breath.    . ALPRAZolam (XANAX) 0.25 MG tablet Take 0.25 mg by mouth at bedtime as needed for anxiety or sleep.    . budesonide-formoterol (SYMBICORT) 160-4.5 MCG/ACT inhaler Inhale 2 puffs into the lungs 2 (two) times daily.    . calcium citrate-vitamin D (CITRACAL+D) 315-200 MG-UNIT tablet Take 1 tablet by mouth daily.    . cyanocobalamin 1000 MCG tablet Take 1,000 mcg by mouth daily.    Marland Kitchen. escitalopram (LEXAPRO) 10 MG tablet Take 10 mg by mouth daily.  0  . famotidine (PEPCID) 20 MG tablet Take 20 mg by mouth 2 (two) times a day.    . fluticasone (FLONASE) 50 MCG/ACT nasal spray Place 2 sprays into both nostrils daily.    . furosemide (LASIX) 20 MG tablet Take 20 mg by mouth daily.    Marland Kitchen. lisinopril (PRINIVIL,ZESTRIL) 2.5 MG tablet Take 2.5 mg by mouth daily.    . metoprolol succinate (TOPROL-XL) 25 MG 24 hr tablet Take 25 mg by mouth daily.  1  . simvastatin (ZOCOR) 20 MG tablet Take 20 mg by mouth at bedtime.    Marland Kitchen. SPIRIVA RESPIMAT 2.5 MCG/ACT AERS Inhale 2 puffs into the lungs daily.   0  . traMADol (ULTRAM) 50 MG tablet Take 75 mg by mouth 2 (two) times daily as needed for moderate pain.     . Vitamin D, Ergocalciferol, (DRISDOL) 50000 units CAPS capsule Take 50,000 Units by mouth every 7 (seven) days. Pt takes on Saturday.     No current facility-administered medications for this visit.     Review of Systems  Constitutional: Positive for fatigue. Negative for appetite change, chills and fever.  HENT:   Negative for hearing loss and voice change.   Eyes: Negative for eye problems.  Respiratory: Positive for shortness of breath. Negative for chest tightness and cough.   Cardiovascular: Negative for chest pain.  Gastrointestinal: Negative for abdominal distention, abdominal pain and blood in stool.  Endocrine: Negative for hot flashes.  Genitourinary: Negative for difficulty urinating and  frequency.   Musculoskeletal: Negative for arthralgias.  Skin: Negative for itching and rash.  Neurological: Negative for extremity weakness.  Hematological: Negative for adenopathy.  Psychiatric/Behavioral: Negative for confusion.      Physical Exam: Blood pressure (!) 145/54, pulse 91, temperature 98 F (36.7 C), temperature source Oral, resp. rate (!) 24, height 5' (1.524 m), weight 110 lb 11.2 oz (50.2 kg), SpO2 100 %. Physical Exam  Constitutional: She is oriented to person, place, and time. No distress.  thin  HENT:  Head: Normocephalic.  Mouth/Throat: No oropharyngeal exudate.  Eyes: Pupils are equal, round, and reactive to light. EOM are normal. No scleral icterus.  Neck: Normal range of motion. Neck supple.  Cardiovascular: Normal rate and regular rhythm.  No murmur heard. Pulmonary/Chest: Effort normal. No respiratory distress.  Severely decreased bilateral breath sounds.  Breathing via oxygen.  Abdominal: Soft.  Musculoskeletal: Normal range of motion.        General: No edema.  Neurological: She is alert and oriented to person, place, and time. She exhibits normal muscle tone.  Skin: Skin is warm and dry. She is not diaphoretic. No erythema.  Psychiatric: Affect normal.     No visits with results within 3 Day(s) from this visit.  Latest known visit with results is:  Appointment on 08/23/2018  Component Date Value Ref Range Status  . Vitamin B-12 08/23/2018 768  180 - 914 pg/mL Final   Comment: (NOTE) This assay is not validated for testing neonatal or myeloproliferative syndrome specimens for Vitamin B12 levels. Performed at [facility], [place] [address], [place], [place] [address]   . Ferritin 08/23/2018 41  11 - 307 ng/mL Final   Performed at [facility], [address] [place], [place] [address]  . Sodium 08/23/2018 135  135 - 145 mmol/L Final  . Potassium 08/23/2018 3.8  3.5 - 5.1 mmol/L Final  . Chloride 08/23/2018 98  98 - 111 mmol/L  Final  . CO2 08/23/2018 29  22 - 32 mmol/L Final  . Glucose, Bld 08/23/2018 94  70 - 99 mg/dL Final  . BUN 08/23/2018 19  8 - 23 mg/dL Final  . Creatinine, Ser 08/23/2018 0.87  0.44 - 1.00 mg/dL Final  . Calcium 08/23/2018 9.7  8.9 - 10.3 mg/dL Final  . Total Protein 08/23/2018 6.4* 6.5 - 8.1 g/dL Final  . Albumin 08/23/2018 4.1  3.5 - 5.0 g/dL Final  . AST 08/23/2018 18  15 - 41 U/L Final  . ALT 08/23/2018 21  0 - 44 U/L Final  . Alkaline Phosphatase 08/23/2018 61  38 - 126 U/L Final  . Total Bilirubin 08/23/2018 0.5  0.3 - 1.2 mg/dL Final  . GFR calc non Af Amer 08/23/2018 >60  >60 mL/min Final  . GFR calc Af Amer 08/23/2018 >60  >60 mL/min Final  . Anion gap 08/23/2018 8  5 - 15 Final   Performed at [facility], [address], [place], [place] [address]  . WBC 08/23/2018 5.7  4.0 - 10.5 K/uL Final  . RBC 08/23/2018 4.02  3.87 - 5.11 MIL/uL Final  . Hemoglobin 08/23/2018 11.9* 12.0 - 15.0 g/dL Final  . HCT 08/23/2018 35.7* 36.0 - 46.0 % Final  . MCV 08/23/2018 88.8  80.0 - 100.0 fL Final  . MCH 08/23/2018 29.6  26.0 - 34.0 pg Final  . MCHC 08/23/2018 33.3  30.0 - 36.0 g/dL Final  . RDW 08/23/2018 12.4  11.5 - 15.5 % Final  . Platelets 08/23/2018 247  150 - 400 K/uL Final  . nRBC 08/23/2018 0.0  0.0 - 0.2 % Final  . Neutrophils Relative % 08/23/2018 61  % Final  . Neutro Abs 08/23/2018 3.5  1.7 - 7.7 K/uL Final  . Lymphocytes Relative 08/23/2018 29  % Final  . Lymphs Abs 08/23/2018 1.7  0.7 - 4.0 K/uL Final  . Monocytes Relative 08/23/2018 8  % Final  . Monocytes Absolute 08/23/2018 0.5  0.1 - 1.0 K/uL Final  . Eosinophils Relative 08/23/2018 1  % Final  . Eosinophils Absolute 08/23/2018 0.0  0.0 - 0.5 K/uL Final  . Basophils Relative 08/23/2018 1  % Final  . Basophils Absolute 08/23/2018 0.0  0.0 - 0.1 K/uL Final  .  Immature Granulocytes 08/23/2018 0  % Final  . Abs Immature Granulocytes 08/23/2018 0.01  0.00 - 0.07 K/uL Final   Performed at [facility], [address] [place], [place] [address]  . Iron 08/23/2018 65  28 - 170 ug/dL Final  . TIBC 60/45/409808/14/2020 357  250 - 450 ug/dL Final  . Saturation Ratios 08/23/2018 18  10.4 - 31.8 % Final  . UIBC 08/23/2018 292  ug/dL Final   Performed at [facility], [address] [place], [place] [address]    Assessment:  Tanya NoonCarolyn F Silliman is a 78 y.o. female with iron deficiency.  She has a history of bleeding gastric ulcer years ago and colonic polyps s/p partial colectomy in 2013.    1. Iron deficiency anemia, unspecified iron deficiency anemia type   2. B12 deficiency   3. History of partial colectomy     #History of iron deficiency anemia. Labs are reviewed and discussed with patient. Ferritin 41, iron saturation 18. Hold Venofer today.  Continue monitor iron level.  #History of vitamin B12 deficiency.  Vitamin B12 level was 768. Recommend patient to continue oral vitamin B12 supplementation  #History of partial colectomy.  Encourage patient to continue follow-[facility]. Repeat CBC, iron, TIBC, ferritin in 3 months.  Rickard PatienceZhou Phuoc Huy, MD  10/19/2017, 5:08 PM

## 2018-08-26 NOTE — Progress Notes (Signed)
No new concerns voiced today 

## 2018-09-16 DIAGNOSIS — J432 Centrilobular emphysema: Secondary | ICD-10-CM | POA: Diagnosis not present

## 2018-09-16 DIAGNOSIS — J439 Emphysema, unspecified: Secondary | ICD-10-CM | POA: Diagnosis not present

## 2018-09-16 DIAGNOSIS — J449 Chronic obstructive pulmonary disease, unspecified: Secondary | ICD-10-CM | POA: Diagnosis not present

## 2018-09-23 DIAGNOSIS — I10 Essential (primary) hypertension: Secondary | ICD-10-CM | POA: Diagnosis not present

## 2018-09-23 DIAGNOSIS — G2581 Restless legs syndrome: Secondary | ICD-10-CM | POA: Diagnosis not present

## 2018-09-23 DIAGNOSIS — J432 Centrilobular emphysema: Secondary | ICD-10-CM | POA: Diagnosis not present

## 2018-09-23 DIAGNOSIS — I5022 Chronic systolic (congestive) heart failure: Secondary | ICD-10-CM | POA: Diagnosis not present

## 2018-09-23 DIAGNOSIS — R0600 Dyspnea, unspecified: Secondary | ICD-10-CM | POA: Diagnosis not present

## 2018-09-23 DIAGNOSIS — J449 Chronic obstructive pulmonary disease, unspecified: Secondary | ICD-10-CM | POA: Diagnosis not present

## 2018-09-23 DIAGNOSIS — I2510 Atherosclerotic heart disease of native coronary artery without angina pectoris: Secondary | ICD-10-CM | POA: Diagnosis not present

## 2018-09-23 DIAGNOSIS — J9611 Chronic respiratory failure with hypoxia: Secondary | ICD-10-CM | POA: Diagnosis not present

## 2018-09-23 DIAGNOSIS — Z9981 Dependence on supplemental oxygen: Secondary | ICD-10-CM | POA: Diagnosis not present

## 2018-09-23 DIAGNOSIS — M47816 Spondylosis without myelopathy or radiculopathy, lumbar region: Secondary | ICD-10-CM | POA: Diagnosis not present

## 2018-09-23 DIAGNOSIS — F3340 Major depressive disorder, recurrent, in remission, unspecified: Secondary | ICD-10-CM | POA: Diagnosis not present

## 2018-09-23 DIAGNOSIS — Z0000 Encounter for general adult medical examination without abnormal findings: Secondary | ICD-10-CM | POA: Diagnosis not present

## 2018-10-16 DIAGNOSIS — J449 Chronic obstructive pulmonary disease, unspecified: Secondary | ICD-10-CM | POA: Diagnosis not present

## 2018-10-16 DIAGNOSIS — J432 Centrilobular emphysema: Secondary | ICD-10-CM | POA: Diagnosis not present

## 2018-10-16 DIAGNOSIS — J439 Emphysema, unspecified: Secondary | ICD-10-CM | POA: Diagnosis not present

## 2018-11-22 ENCOUNTER — Other Ambulatory Visit: Payer: Self-pay

## 2018-11-25 ENCOUNTER — Inpatient Hospital Stay: Payer: PPO | Attending: Oncology

## 2018-11-25 ENCOUNTER — Other Ambulatory Visit: Payer: Self-pay

## 2018-11-25 DIAGNOSIS — D509 Iron deficiency anemia, unspecified: Secondary | ICD-10-CM | POA: Insufficient documentation

## 2018-11-25 DIAGNOSIS — E538 Deficiency of other specified B group vitamins: Secondary | ICD-10-CM

## 2018-11-25 LAB — CBC WITH DIFFERENTIAL/PLATELET
Abs Immature Granulocytes: 0.01 10*3/uL (ref 0.00–0.07)
Basophils Absolute: 0 10*3/uL (ref 0.0–0.1)
Basophils Relative: 0 %
Eosinophils Absolute: 0.1 10*3/uL (ref 0.0–0.5)
Eosinophils Relative: 2 %
HCT: 31.9 % — ABNORMAL LOW (ref 36.0–46.0)
Hemoglobin: 10.6 g/dL — ABNORMAL LOW (ref 12.0–15.0)
Immature Granulocytes: 0 %
Lymphocytes Relative: 38 %
Lymphs Abs: 1.7 10*3/uL (ref 0.7–4.0)
MCH: 29 pg (ref 26.0–34.0)
MCHC: 33.2 g/dL (ref 30.0–36.0)
MCV: 87.4 fL (ref 80.0–100.0)
Monocytes Absolute: 0.4 10*3/uL (ref 0.1–1.0)
Monocytes Relative: 9 %
Neutro Abs: 2.3 10*3/uL (ref 1.7–7.7)
Neutrophils Relative %: 51 %
Platelets: 224 10*3/uL (ref 150–400)
RBC: 3.65 MIL/uL — ABNORMAL LOW (ref 3.87–5.11)
RDW: 12.9 % (ref 11.5–15.5)
WBC: 4.5 10*3/uL (ref 4.0–10.5)
nRBC: 0 % (ref 0.0–0.2)

## 2018-11-25 LAB — FERRITIN: Ferritin: 20 ng/mL (ref 11–307)

## 2018-11-25 LAB — VITAMIN B12: Vitamin B-12: 497 pg/mL (ref 180–914)

## 2018-11-25 LAB — IRON AND TIBC
Iron: 60 ug/dL (ref 28–170)
Saturation Ratios: 17 % (ref 10.4–31.8)
TIBC: 349 ug/dL (ref 250–450)
UIBC: 289 ug/dL

## 2018-11-26 ENCOUNTER — Inpatient Hospital Stay: Payer: PPO

## 2018-11-26 ENCOUNTER — Inpatient Hospital Stay: Payer: PPO | Admitting: Oncology

## 2018-12-10 ENCOUNTER — Telehealth: Payer: Self-pay | Admitting: Oncology

## 2018-12-10 NOTE — Telephone Encounter (Signed)
Left voicemail for pt regarding appts on 12/7, waiting for call back  °srw °

## 2018-12-13 ENCOUNTER — Other Ambulatory Visit: Payer: Self-pay

## 2018-12-16 ENCOUNTER — Inpatient Hospital Stay: Payer: PPO | Attending: Oncology | Admitting: Oncology

## 2018-12-16 ENCOUNTER — Encounter: Payer: Self-pay | Admitting: Oncology

## 2018-12-16 ENCOUNTER — Other Ambulatory Visit: Payer: Self-pay

## 2018-12-16 ENCOUNTER — Inpatient Hospital Stay: Payer: PPO

## 2018-12-16 VITALS — BP 161/71 | HR 101 | Temp 97.5°F | Wt 111.9 lb

## 2018-12-16 VITALS — BP 135/68 | HR 85 | Temp 96.8°F | Resp 18

## 2018-12-16 DIAGNOSIS — Z79899 Other long term (current) drug therapy: Secondary | ICD-10-CM | POA: Diagnosis not present

## 2018-12-16 DIAGNOSIS — Z86010 Personal history of colonic polyps: Secondary | ICD-10-CM | POA: Insufficient documentation

## 2018-12-16 DIAGNOSIS — Z8711 Personal history of peptic ulcer disease: Secondary | ICD-10-CM | POA: Diagnosis not present

## 2018-12-16 DIAGNOSIS — E785 Hyperlipidemia, unspecified: Secondary | ICD-10-CM | POA: Insufficient documentation

## 2018-12-16 DIAGNOSIS — R918 Other nonspecific abnormal finding of lung field: Secondary | ICD-10-CM | POA: Diagnosis not present

## 2018-12-16 DIAGNOSIS — Z9049 Acquired absence of other specified parts of digestive tract: Secondary | ICD-10-CM | POA: Diagnosis not present

## 2018-12-16 DIAGNOSIS — I110 Hypertensive heart disease with heart failure: Secondary | ICD-10-CM | POA: Diagnosis not present

## 2018-12-16 DIAGNOSIS — D500 Iron deficiency anemia secondary to blood loss (chronic): Secondary | ICD-10-CM | POA: Insufficient documentation

## 2018-12-16 DIAGNOSIS — I5022 Chronic systolic (congestive) heart failure: Secondary | ICD-10-CM | POA: Diagnosis not present

## 2018-12-16 DIAGNOSIS — D509 Iron deficiency anemia, unspecified: Secondary | ICD-10-CM

## 2018-12-16 DIAGNOSIS — G2581 Restless legs syndrome: Secondary | ICD-10-CM | POA: Insufficient documentation

## 2018-12-16 DIAGNOSIS — Z87891 Personal history of nicotine dependence: Secondary | ICD-10-CM | POA: Diagnosis not present

## 2018-12-16 DIAGNOSIS — Z9981 Dependence on supplemental oxygen: Secondary | ICD-10-CM | POA: Insufficient documentation

## 2018-12-16 DIAGNOSIS — J439 Emphysema, unspecified: Secondary | ICD-10-CM | POA: Insufficient documentation

## 2018-12-16 MED ORDER — SODIUM CHLORIDE 0.9 % IV SOLN
Freq: Once | INTRAVENOUS | Status: AC
  Administered 2018-12-16: 14:00:00 via INTRAVENOUS
  Filled 2018-12-16: qty 250

## 2018-12-16 MED ORDER — IRON SUCROSE 20 MG/ML IV SOLN
200.0000 mg | Freq: Once | INTRAVENOUS | Status: AC
  Administered 2018-12-16: 14:00:00 200 mg via INTRAVENOUS
  Filled 2018-12-16: qty 10

## 2018-12-16 NOTE — Progress Notes (Signed)
Patient here for follow up. No concerns voiced.  °

## 2018-12-16 NOTE — Progress Notes (Signed)
. [facility] day:  10/19/17  Chief Complaint: Tanya Rhodes is a 78 y.o. female with a history of iron deficiency anemia present for follow-up.  PERTINENT HEMATOLOGY HISTORY Patient was previously followed by Dr. Mike Gip.   Patient establish care with me on 05/30/2018 when she had a virtual visit with me. Extensive chart review was performed by me.  Patient had a history of gastric ulcer years ago and a colonic polyp status post partial colectomy in 2013.  Colonoscopy on 05/15/2013 reviewed patent ileocolonic anastomosis and internal hemorrhoids.  EGD on 05/15/2013 was normal.  Patient has iron deficiency and has been receiving IV Venofer if ferritin is less than 30. History of B12 deficiency is taking oral B12 supplementation.  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  Smoking history greater than 30-pack-year.  CT chest angiogram 04/01/2014 shows no pulmonary embolism but a 4 mm left upper lobe pulmonary nodule.  Chest CT to 03/04/18 17 revealed a stable 3 mm lingular nodule.  There were new groundglass opacities in the right upper lobe.  Right middle lobe and right lower lobe suggesting infectious or inflammatory process. CT chest 07/30/2015 revealed a stable 3 mm left lower lobe nodule. Patient follows up with [facility] gastroenterology Dr. Vira Agar.  INTERVAL HISTORY Tanya CRISLIP is a 78 y.o. female who has above history reviewed by me today presents for follow up visit for management of history of anemia. Problems and complaints are listed below: Patient reports feeling well today.  No new complaints.  Denies any epigastric discomfort, black tarry stool or bloody stool.  She has chronic shortness of breath home oxygen.   Past Medical History:  Diagnosis Date  . Allergic rhinitis   . Centrilobular emphysema ([place])   . Chronic systolic CHF (congestive heart failure) ([place])   . Colon polyposis   . COPD (chronic obstructive pulmonary  disease) ([place])   . Coronary artery disease involving native coronary artery of native heart without angina pectoris   . DDD (degenerative disc disease), lumbar   . Essential hypertension   . HLD (hyperlipidemia)   . Hypertension   . IDA (iron deficiency anemia)   . Low serum vitamin D   . Lumbar radiculitis   . Lumbar spondylitis ([place])   . Lumbar stenosis with neurogenic claudication   . Melena   . PUD (peptic ulcer disease)   . Restless leg   . Rotator cuff rupture   . VHD (valvular heart disease)     Past Surgical History:  Procedure Laterality Date  . BREAST BIOPSY Right 11/20/11   neg [place] core  . COLECTOMY    . CORONARY ANGIOPLASTY WITH STENT PLACEMENT      Family History  Problem Relation Age of Onset  . Heart disease Brother   . Heart disease Sister     Social History:  reports that she has quit smoking. She has never used smokeless tobacco. She reports that she does not drink alcohol or use drugs.  She has a greater than 30 pack year smoking history.  She quit smoking in 1995.  The patient's son can not bring her to clinic on Tuesdays and Thursdays.  She lives in [place].    Allergies:  Allergies  Allergen Reactions  . Ropinirole Anxiety and Other (See Comments)    Reaction:  Severe leg pain   . Aspirin Other (See Comments)    GIB  . Ferrous Fumarate Itching  . Ferrous Sulfate Diarrhea and Nausea  And Vomiting  . Omeprazole Nausea And Vomiting  . Oxycodone Other (See Comments)    Reaction:  Makes pt hyper   . Prednisone Other (See Comments)    Reaction:  Makes pt hyper     Current Medications: Current Outpatient Medications  Medication Sig Dispense Refill  . acetaminophen (TYLENOL) 500 MG tablet Take 500 mg by mouth every 6 (six) hours as needed for mild pain, moderate pain, fever or headache.     . albuterol (PROVENTIL HFA;VENTOLIN HFA) 108 (90 Base) MCG/ACT inhaler Inhale 2 puffs into the lungs every 4 (four) hours as needed for wheezing or shortness of  breath.     Marland Kitchen albuterol (PROVENTIL) (2.5 MG/3ML) 0.083% nebulizer solution Take 2.5 mg by nebulization every 6 (six) hours as needed for wheezing or shortness of breath.    . budesonide-formoterol (SYMBICORT) 160-4.5 MCG/ACT inhaler Inhale 2 puffs into the lungs 2 (two) times daily.    . calcium citrate-vitamin D (CITRACAL+D) 315-200 MG-UNIT tablet Take 1 tablet by mouth daily.    . cyanocobalamin 1000 MCG tablet Take 1,000 mcg by mouth daily.    Marland Kitchen escitalopram (LEXAPRO) 10 MG tablet Take 10 mg by mouth daily.  0  . fluticasone (FLONASE) 50 MCG/ACT nasal spray Place 2 sprays into both nostrils daily.    . furosemide (LASIX) 20 MG tablet Take 20 mg by mouth daily.    Marland Kitchen lisinopril (PRINIVIL,ZESTRIL) 2.5 MG tablet Take 2.5 mg by mouth daily.    . metoprolol succinate (TOPROL-XL) 25 MG 24 hr tablet Take 25 mg by mouth daily.  1  . simvastatin (ZOCOR) 20 MG tablet Take 20 mg by mouth at bedtime.    Marland Kitchen SPIRIVA RESPIMAT 2.5 MCG/ACT AERS Inhale 2 puffs into the lungs daily.   0  . traMADol (ULTRAM) 50 MG tablet Take 75 mg by mouth 2 (two) times daily as needed for moderate pain.     . Vitamin D, Ergocalciferol, (DRISDOL) 50000 units CAPS capsule Take 50,000 Units by mouth every 7 (seven) days. Pt takes on Saturday.    . ALPRAZolam (XANAX) 0.25 MG tablet Take 0.25 mg by mouth at bedtime as needed for anxiety or sleep.     No current facility-administered medications for this visit.     Review of Systems  Constitutional: Positive for fatigue. Negative for appetite change, chills and fever.  HENT:   Negative for hearing loss and voice change.   Eyes: Negative for eye problems.  Respiratory: Positive for shortness of breath. Negative for chest tightness and cough.   Cardiovascular: Negative for chest pain.  Gastrointestinal: Negative for abdominal distention, abdominal pain and blood in stool.  Endocrine: Negative for hot flashes.  Genitourinary: Negative for difficulty urinating and frequency.    Musculoskeletal: Negative for arthralgias.  Skin: Negative for itching and rash.  Neurological: Negative for extremity weakness.  Hematological: Negative for adenopathy.  Psychiatric/Behavioral: Negative for confusion.      Physical Exam: Blood pressure (!) 161/71, pulse (!) 101, temperature (!) 97.5 F (36.4 C), temperature source Tympanic, weight 111 lb 14.4 oz (50.8 kg). Physical Exam  Constitutional: She is oriented to person, place, and time. No distress.  thin  HENT:  Head: Normocephalic and atraumatic.  Nose: Nose normal.  Mouth/Throat: Oropharynx is clear and moist. No oropharyngeal exudate.  Eyes: Pupils are equal, round, and reactive to light. EOM are normal. No scleral icterus.  Neck: Normal range of motion. Neck supple.  Cardiovascular: Normal rate and regular rhythm.  No murmur heard. Pulmonary/Chest:  Effort normal. No respiratory distress. She has no rales. She exhibits no tenderness.  Severely decreased bilateral breath sounds.  Breathing via nasal cannula oxygen.  Abdominal: Soft. She exhibits no distension. There is no abdominal tenderness.  Musculoskeletal: Normal range of motion.        General: No edema.  Neurological: She is alert and oriented to person, place, and time. No cranial nerve deficit. She exhibits normal muscle tone. Coordination normal.  Skin: Skin is warm and dry. She is not diaphoretic. No erythema.  Psychiatric: Affect normal.     No visits with results within 3 Day(s) from this visit.  Latest known visit with results is:  Appointment on 11/25/2018  Component Date Value Ref Range Status  . Vitamin B-12 11/25/2018 497  180 - 914 pg/mL Final   Comment: (NOTE) This assay is not validated for testing neonatal or myeloproliferative syndrome specimens for Vitamin B12 levels. Performed at [facility], [address], [place], [place] [address]   . Iron 11/25/2018 60  28 - 170 ug/dL Final  . TIBC 72/09/4707 349  250 - 450 ug/dL Final   . Saturation Ratios 11/25/2018 17  10.4 - 31.8 % Final  . UIBC 11/25/2018 289  ug/dL Final   Performed at [facility], [address], [place], [place] [address]  . Ferritin 11/25/2018 20  11 - 307 ng/mL Final   Performed at [facility], [address] [place] [place], [place] [address]  . WBC 11/25/2018 4.5  4.0 - 10.5 K/uL Final  . RBC 11/25/2018 3.65* 3.87 - 5.11 MIL/uL Final  . Hemoglobin 11/25/2018 10.6* 12.0 - 15.0 g/dL Final  . HCT 65/46/5035 31.9* 36.0 - 46.0 % Final  . MCV 11/25/2018 87.4  80.0 - 100.0 fL Final  . MCH 11/25/2018 29.0  26.0 - 34.0 pg Final  . MCHC 11/25/2018 33.2  30.0 - 36.0 g/dL Final  . RDW 46/56/8127 12.9  11.5 - 15.5 % Final  . Platelets 11/25/2018 224  150 - 400 K/uL Final  . nRBC 11/25/2018 0.0  0.0 - 0.2 % Final  . Neutrophils Relative % 11/25/2018 51  % Final  . Neutro Abs 11/25/2018 2.3  1.7 - 7.7 K/uL Final  . Lymphocytes Relative 11/25/2018 38  % Final  . Lymphs Abs 11/25/2018 1.7  0.7 - 4.0 K/uL Final  . Monocytes Relative 11/25/2018 9  % Final  . Monocytes Absolute 11/25/2018 0.4  0.1 - 1.0 K/uL Final  . Eosinophils Relative 11/25/2018 2  % Final  . Eosinophils Absolute 11/25/2018 0.1  0.0 - 0.5 K/uL Final  . Basophils Relative 11/25/2018 0  % Final  . Basophils Absolute 11/25/2018 0.0  0.0 - 0.1 K/uL Final  . Immature Granulocytes 11/25/2018 0  % Final  . Abs Immature Granulocytes 11/25/2018 0.01  0.00 - 0.07 K/uL Final   Performed at [facility], [address], [place], [place] [address]    Assessment:  VONDRA ALDREDGE is a 78 y.o. female with iron deficiency.  She has a history of bleeding gastric ulcer years ago and colonic polyps s/p partial colectomy in 2013.    1. Iron deficiency anemia due to chronic blood loss   2. History of partial colectomy     #History of iron deficiency anemia. Labs are reviewed and discussed with patient. Ferritin has decreased to 20.  Iron saturation 17. Patient cannot tolerate  oral iron supplementation previously Hemoglobin worsened, decreased to 10.6 today. Recommend patient to proceed with  1 dose of Venofer 200 mg IV today. Repeat blood work in 3 months for evaluation additional IV iron.  History of gastric ulcer/history of partial colectomy., continue follow-up with gastroenterology.Marland Kitchen.gy clinic. Repeat CBC, iron, TIBC, ferritin in 3 months. We spent sufficient time to discuss many aspect of care, questions were answered to patient's satisfaction. Total face to face encounter time for this patient visit was 15 min. >50% of the time was  spent in counseling and coordination of care.    Rickard PatienceZhou Marcha Licklider, MD  10/19/2017, 9:36 PM

## 2018-12-19 ENCOUNTER — Other Ambulatory Visit: Payer: Self-pay | Admitting: *Deleted

## 2018-12-19 NOTE — Patient Outreach (Signed)
[facility]) Care Management  12/19/2018  Tanya Rhodes 12/04/1940 [id number]   Case reviewed, no patient outreach needed,  and case closed per Bary Castilla, Assistant Clinical Director at [facility]  request.   Colbert Coyer. Annia Friendly, BSN, [facility] Management [facility] Telephonic CM Phone: [phone] Fax: [phone]

## 2019-01-21 DIAGNOSIS — Z9981 Dependence on supplemental oxygen: Secondary | ICD-10-CM | POA: Diagnosis not present

## 2019-01-21 DIAGNOSIS — R0600 Dyspnea, unspecified: Secondary | ICD-10-CM | POA: Diagnosis not present

## 2019-01-21 DIAGNOSIS — J432 Centrilobular emphysema: Secondary | ICD-10-CM | POA: Diagnosis not present

## 2019-02-26 DIAGNOSIS — J432 Centrilobular emphysema: Secondary | ICD-10-CM | POA: Diagnosis not present

## 2019-02-26 DIAGNOSIS — Z9981 Dependence on supplemental oxygen: Secondary | ICD-10-CM | POA: Diagnosis not present

## 2019-02-26 DIAGNOSIS — R0600 Dyspnea, unspecified: Secondary | ICD-10-CM | POA: Diagnosis not present

## 2019-03-14 ENCOUNTER — Telehealth: Payer: Self-pay | Admitting: *Deleted

## 2019-03-14 NOTE — Telephone Encounter (Signed)
All appts R/S due to MD being out of the office on 03/21/19.  Tried calling pt multiple times to make her aware.  Also tried calling pt's son Caryn Bee and left 3 message on his vmail making him aware as well.  Appts were R/S and a NEW appt letter was mailed.Marland Kitchen

## 2019-03-19 ENCOUNTER — Other Ambulatory Visit: Payer: PPO

## 2019-03-21 ENCOUNTER — Ambulatory Visit: Payer: PPO | Admitting: Oncology

## 2019-03-21 ENCOUNTER — Ambulatory Visit: Payer: PPO

## 2019-03-28 ENCOUNTER — Other Ambulatory Visit: Payer: Self-pay

## 2019-03-28 ENCOUNTER — Inpatient Hospital Stay: Payer: PPO | Attending: Oncology

## 2019-03-28 DIAGNOSIS — Z933 Colostomy status: Secondary | ICD-10-CM | POA: Diagnosis not present

## 2019-03-28 DIAGNOSIS — Z8711 Personal history of peptic ulcer disease: Secondary | ICD-10-CM | POA: Diagnosis not present

## 2019-03-28 DIAGNOSIS — D500 Iron deficiency anemia secondary to blood loss (chronic): Secondary | ICD-10-CM

## 2019-03-28 DIAGNOSIS — I110 Hypertensive heart disease with heart failure: Secondary | ICD-10-CM | POA: Diagnosis not present

## 2019-03-28 DIAGNOSIS — F17210 Nicotine dependence, cigarettes, uncomplicated: Secondary | ICD-10-CM | POA: Diagnosis not present

## 2019-03-28 DIAGNOSIS — I5022 Chronic systolic (congestive) heart failure: Secondary | ICD-10-CM | POA: Insufficient documentation

## 2019-03-28 DIAGNOSIS — J439 Emphysema, unspecified: Secondary | ICD-10-CM | POA: Diagnosis not present

## 2019-03-28 DIAGNOSIS — R918 Other nonspecific abnormal finding of lung field: Secondary | ICD-10-CM | POA: Diagnosis not present

## 2019-03-28 DIAGNOSIS — E785 Hyperlipidemia, unspecified: Secondary | ICD-10-CM | POA: Insufficient documentation

## 2019-03-28 DIAGNOSIS — G2581 Restless legs syndrome: Secondary | ICD-10-CM | POA: Insufficient documentation

## 2019-03-28 DIAGNOSIS — D509 Iron deficiency anemia, unspecified: Secondary | ICD-10-CM | POA: Diagnosis not present

## 2019-03-28 DIAGNOSIS — Z79899 Other long term (current) drug therapy: Secondary | ICD-10-CM | POA: Insufficient documentation

## 2019-03-28 DIAGNOSIS — I2510 Atherosclerotic heart disease of native coronary artery without angina pectoris: Secondary | ICD-10-CM | POA: Diagnosis not present

## 2019-03-28 DIAGNOSIS — Z87891 Personal history of nicotine dependence: Secondary | ICD-10-CM | POA: Diagnosis not present

## 2019-03-28 DIAGNOSIS — E538 Deficiency of other specified B group vitamins: Secondary | ICD-10-CM | POA: Insufficient documentation

## 2019-03-28 LAB — CBC WITH DIFFERENTIAL/PLATELET
Abs Immature Granulocytes: 0.02 10*3/uL (ref 0.00–0.07)
Basophils Absolute: 0 10*3/uL (ref 0.0–0.1)
Basophils Relative: 1 %
Eosinophils Absolute: 0.1 10*3/uL (ref 0.0–0.5)
Eosinophils Relative: 2 %
HCT: 36 % (ref 36.0–46.0)
Hemoglobin: 11.9 g/dL — ABNORMAL LOW (ref 12.0–15.0)
Immature Granulocytes: 0 %
Lymphocytes Relative: 33 %
Lymphs Abs: 2.1 10*3/uL (ref 0.7–4.0)
MCH: 29.7 pg (ref 26.0–34.0)
MCHC: 33.1 g/dL (ref 30.0–36.0)
MCV: 89.8 fL (ref 80.0–100.0)
Monocytes Absolute: 0.5 10*3/uL (ref 0.1–1.0)
Monocytes Relative: 7 %
Neutro Abs: 3.5 10*3/uL (ref 1.7–7.7)
Neutrophils Relative %: 57 %
Platelets: 252 10*3/uL (ref 150–400)
RBC: 4.01 MIL/uL (ref 3.87–5.11)
RDW: 13.2 % (ref 11.5–15.5)
WBC: 6.3 10*3/uL (ref 4.0–10.5)
nRBC: 0 % (ref 0.0–0.2)

## 2019-03-28 LAB — IRON AND TIBC
Iron: 77 ug/dL (ref 28–170)
Saturation Ratios: 21 % (ref 10.4–31.8)
TIBC: 364 ug/dL (ref 250–450)
UIBC: 287 ug/dL

## 2019-03-28 LAB — FERRITIN: Ferritin: 32 ng/mL (ref 11–307)

## 2019-04-01 ENCOUNTER — Encounter: Payer: Self-pay | Admitting: Oncology

## 2019-04-01 ENCOUNTER — Inpatient Hospital Stay: Payer: PPO

## 2019-04-01 ENCOUNTER — Inpatient Hospital Stay (HOSPITAL_BASED_OUTPATIENT_CLINIC_OR_DEPARTMENT_OTHER): Payer: PPO | Admitting: Oncology

## 2019-04-01 VITALS — BP 147/65 | HR 71 | Temp 96.1°F | Resp 18 | Wt 108.5 lb

## 2019-04-01 DIAGNOSIS — D500 Iron deficiency anemia secondary to blood loss (chronic): Secondary | ICD-10-CM

## 2019-04-01 DIAGNOSIS — E538 Deficiency of other specified B group vitamins: Secondary | ICD-10-CM

## 2019-04-01 DIAGNOSIS — D509 Iron deficiency anemia, unspecified: Secondary | ICD-10-CM | POA: Diagnosis not present

## 2019-04-01 DIAGNOSIS — Z9049 Acquired absence of other specified parts of digestive tract: Secondary | ICD-10-CM

## 2019-04-01 NOTE — Progress Notes (Signed)
Tanya Rhodes-  Cancer Rhodes  Clinic day:  10/19/17  Chief Complaint: Tanya Rhodes is a 79 y.o. female with a history of iron deficiency anemia present for follow-up.  PERTINENT HEMATOLOGY HISTORY Patient was previously followed by Dr. Merlene Pulling.   Patient establish care with me on 05/30/2018 when she had a virtual visit with me. Extensive chart review was performed by me.  Patient had a history of gastric ulcer years ago and a colonic polyp status post partial colectomy in 2013.  Colonoscopy on 05/15/2013 reviewed patent ileocolonic anastomosis and internal hemorrhoids.  EGD on 05/15/2013 was normal.  Patient has iron deficiency and has been receiving IV Venofer if ferritin is less than 30. History of B12 deficiency is taking oral B12 supplementation.  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  Smoking history greater than 30-pack-year.  CT chest angiogram 04/01/2014 shows no pulmonary embolism but a 4 mm left upper lobe pulmonary nodule.  Chest CT to 03/04/18 17 revealed a stable 3 mm lingular nodule.  There were new groundglass opacities in the right upper lobe.  Right middle lobe and right lower lobe suggesting infectious or inflammatory process. CT chest 07/30/2015 revealed a stable 3 mm left lower lobe nodule. Patient follows up with [facility] gastroenterology Dr. Mechele Collin.  INTERVAL HISTORY Tanya Rhodes is a 78 y.o. female who has above history reviewed by me today presents for follow up visit for management of history of anemia. Problems and complaints are listed below: She has no new complaints today.  No epigastric discomfort, bloody or black stool.   She has chronic shortness of breath home oxygen.   Past Medical History:  Diagnosis Date  . Allergic rhinitis   . Centrilobular emphysema ([facility])   . Chronic systolic CHF (congestive heart failure) ([facility])   . Colon polyposis   . COPD (chronic obstructive pulmonary disease) ([facility])   . Coronary artery  disease involving native coronary artery of native heart without angina pectoris   . DDD (degenerative disc disease), lumbar   . Essential hypertension   . HLD (hyperlipidemia)   . Hypertension   . IDA (iron deficiency anemia)   . Low serum vitamin D   . Lumbar radiculitis   . Lumbar spondylitis ([facility])   . Lumbar stenosis with neurogenic claudication   . Melena   . PUD (peptic ulcer disease)   . Restless leg   . Rotator cuff rupture   . VHD (valvular heart disease)     Past Surgical History:  Procedure Laterality Date  . BREAST BIOPSY Right 11/20/11   neg [place] core  . COLECTOMY    . CORONARY ANGIOPLASTY WITH STENT PLACEMENT      Family History  Problem Relation Age of Onset  . Heart disease Brother   . Heart disease Sister     Social History:  reports that she has quit smoking. She has never used smokeless tobacco. She reports that she does not drink alcohol or use drugs.  She has a greater than 30 pack year smoking history.  She quit smoking in 1995.  The patient's son can not bring her to clinic on Tuesdays and Thursdays.  She lives in [place].    Allergies:  Allergies  Allergen Reactions  . Ropinirole Anxiety and Other (See Comments)    Reaction:  Severe leg pain   . Aspirin Other (See Comments)    GIB  . Ferrous Fumarate Itching  . Ferrous Sulfate Diarrhea and Nausea And Vomiting  . Omeprazole  Nausea And Vomiting  . Oxycodone Other (See Comments)    Reaction:  Makes pt hyper   . Prednisone Other (See Comments)    Reaction:  Makes pt hyper     Current Medications: Current Outpatient Medications  Medication Sig Dispense Refill  . acetaminophen (TYLENOL) 500 MG tablet Take 500 mg by mouth every 6 (six) hours as needed for mild pain, moderate pain, fever or headache.     . albuterol (PROVENTIL HFA;VENTOLIN HFA) 108 (90 Base) MCG/ACT inhaler Inhale 2 puffs into the lungs every 4 (four) hours as needed for wheezing or shortness of breath.     Marland Kitchen albuterol  (PROVENTIL) (2.5 MG/3ML) 0.083% nebulizer solution Take 2.5 mg by nebulization every 6 (six) hours as needed for wheezing or shortness of breath.    . budesonide-formoterol (SYMBICORT) 160-4.5 MCG/ACT inhaler Inhale 2 puffs into the lungs 2 (two) times daily.    . calcium citrate-vitamin D (CITRACAL+D) 315-200 MG-UNIT tablet Take 1 tablet by mouth daily.    . cyanocobalamin 1000 MCG tablet Take 1,000 mcg by mouth daily.    Marland Kitchen escitalopram (LEXAPRO) 10 MG tablet Take 10 mg by mouth daily.  0  . fluticasone (FLONASE) 50 MCG/ACT nasal spray Place 2 sprays into both nostrils daily.    . furosemide (LASIX) 20 MG tablet Take 20 mg by mouth daily.    Marland Kitchen lisinopril (PRINIVIL,ZESTRIL) 2.5 MG tablet Take 2.5 mg by mouth daily.    . metoprolol succinate (TOPROL-XL) 25 MG 24 hr tablet Take 25 mg by mouth daily.  1  . simvastatin (ZOCOR) 20 MG tablet Take 20 mg by mouth at bedtime.    Marland Kitchen SPIRIVA RESPIMAT 2.5 MCG/ACT AERS Inhale 2 puffs into the lungs daily.   0  . traMADol (ULTRAM) 50 MG tablet Take 75 mg by mouth 2 (two) times daily as needed for moderate pain.     . Vitamin D, Ergocalciferol, (DRISDOL) 50000 units CAPS capsule Take 50,000 Units by mouth every 7 (seven) days. Pt takes on Saturday.    . ALPRAZolam (XANAX) 0.25 MG tablet Take 0.25 mg by mouth at bedtime as needed for anxiety or sleep.     No current facility-administered medications for this visit.    Review of Systems  Constitutional: Positive for fatigue. Negative for appetite change, chills and fever.  HENT:   Negative for hearing loss and voice change.   Eyes: Negative for eye problems.  Respiratory: Positive for shortness of breath. Negative for chest tightness and cough.   Cardiovascular: Negative for chest pain.  Gastrointestinal: Negative for abdominal distention, abdominal pain and blood in stool.  Endocrine: Negative for hot flashes.  Genitourinary: Negative for difficulty urinating and frequency.   Musculoskeletal: Negative for  arthralgias.  Skin: Negative for itching and rash.  Neurological: Negative for extremity weakness.  Hematological: Negative for adenopathy.  Psychiatric/Behavioral: Negative for confusion.      Physical Exam: Blood pressure (!) 147/65, pulse 71, temperature (!) 96.1 F (35.6 C), resp. rate 18, weight 108 lb 8 oz (49.2 kg), SpO2 100 %. Physical Exam  Constitutional: She is oriented to person, place, and time. No distress.  HENT:  Head: Normocephalic and atraumatic.  Mouth/Throat: Oropharynx is clear and moist. No oropharyngeal exudate.  Eyes: Pupils are equal, round, and reactive to light. EOM are normal. No scleral icterus.  Cardiovascular: Normal rate and regular rhythm.  No murmur heard. Pulmonary/Chest: Effort normal. No respiratory distress. She has no rales. She exhibits no tenderness.  Decreased breath sounds bilaterally.  Abdominal: Soft. She exhibits no distension. There is no abdominal tenderness.  Musculoskeletal:        General: No edema. Normal range of motion.     Cervical back: Normal range of motion and neck supple.  Neurological: She is alert and oriented to person, place, and time. No cranial nerve deficit. She exhibits normal muscle tone. Coordination normal.  Skin: Skin is warm and dry. She is not diaphoretic. No erythema.  Psychiatric: Affect normal.     No visits with results within 3 Day(s) from this visit.  Latest known visit with results is:  Appointment on 03/28/2019  Component Date Value Ref Range Status  . Ferritin 03/28/2019 32  11 - 307 ng/mL Final   Performed at [facility], [address] [place] [place], [place] [address]  . Iron 03/28/2019 77  28 - 170 ug/dL Final  . TIBC 30/16/0109 364  250 - 450 ug/dL Final  . Saturation Ratios 03/28/2019 21  10.4 - 31.8 % Final  . UIBC 03/28/2019 287  ug/dL Final   Performed at [facility], [address] [place], [place] [address]  . WBC 03/28/2019 6.3  4.0 - 10.5 K/uL Final  . RBC  03/28/2019 4.01  3.87 - 5.11 MIL/uL Final  . Hemoglobin 03/28/2019 11.9* 12.0 - 15.0 g/dL Final  . HCT 73/22/0254 36.0  36.0 - 46.0 % Final  . MCV 03/28/2019 89.8  80.0 - 100.0 fL Final  . MCH 03/28/2019 29.7  26.0 - 34.0 pg Final  . MCHC 03/28/2019 33.1  30.0 - 36.0 g/dL Final  . RDW 27/06/2374 13.2  11.5 - 15.5 % Final  . Platelets 03/28/2019 252  150 - 400 K/uL Final  . nRBC 03/28/2019 0.0  0.0 - 0.2 % Final  . Neutrophils Relative % 03/28/2019 57  % Final  . Neutro Abs 03/28/2019 3.5  1.7 - 7.7 K/uL Final  . Lymphocytes Relative 03/28/2019 33  % Final  . Lymphs Abs 03/28/2019 2.1  0.7 - 4.0 K/uL Final  . Monocytes Relative 03/28/2019 7  % Final  . Monocytes Absolute 03/28/2019 0.5  0.1 - 1.0 K/uL Final  . Eosinophils Relative 03/28/2019 2  % Final  . Eosinophils Absolute 03/28/2019 0.1  0.0 - 0.5 K/uL Final  . Basophils Relative 03/28/2019 1  % Final  . Basophils Absolute 03/28/2019 0.0  0.0 - 0.1 K/uL Final  . Immature Granulocytes 03/28/2019 0  % Final  . Abs Immature Granulocytes 03/28/2019 0.02  0.00 - 0.07 K/uL Final   Performed at [facility], [address], [place], [place] [address]    Assessment:  MARJANI KOBEL is a 79 y.o. female with iron deficiency.  She has a history of bleeding gastric ulcer years ago and colonic polyps s/p partial colectomy in 2013.    1. Iron deficiency anemia due to chronic blood loss   2. History of partial colectomy   3. B12 deficiency     #History of iron deficiency anemia. Labs are reviewed and discussed with patient. Iron panel showed ferritin of 32, and iron saturation 21.  Hemoglobin is 11.9,  Hold of IV Venofer today.   # Vitamin B12 deficiency, last B12 level is normal. Continue oral vitamin B12 oral supplement  Check vitamin B12 level at next visit.   Follow  Up in 4 months.   We spent sufficient time to discuss many aspect of care, questions were answered to patient's satisfaction.  Rickard Patience, MD  10/19/2017,  11:32 PM

## 2019-04-01 NOTE — Progress Notes (Signed)
Patient here for follow up. No concerns voiced.  °

## 2019-04-03 DIAGNOSIS — J432 Centrilobular emphysema: Secondary | ICD-10-CM | POA: Diagnosis not present

## 2019-04-03 DIAGNOSIS — J439 Emphysema, unspecified: Secondary | ICD-10-CM | POA: Diagnosis not present

## 2019-04-03 DIAGNOSIS — J449 Chronic obstructive pulmonary disease, unspecified: Secondary | ICD-10-CM | POA: Diagnosis not present

## 2019-04-29 ENCOUNTER — Other Ambulatory Visit: Payer: Self-pay | Admitting: Nurse Practitioner

## 2019-05-04 DIAGNOSIS — J439 Emphysema, unspecified: Secondary | ICD-10-CM | POA: Diagnosis not present

## 2019-05-04 DIAGNOSIS — J432 Centrilobular emphysema: Secondary | ICD-10-CM | POA: Diagnosis not present

## 2019-05-04 DIAGNOSIS — J449 Chronic obstructive pulmonary disease, unspecified: Secondary | ICD-10-CM | POA: Diagnosis not present

## 2019-05-28 DIAGNOSIS — I5022 Chronic systolic (congestive) heart failure: Secondary | ICD-10-CM | POA: Diagnosis not present

## 2019-05-28 DIAGNOSIS — J9611 Chronic respiratory failure with hypoxia: Secondary | ICD-10-CM | POA: Diagnosis not present

## 2019-05-28 DIAGNOSIS — D510 Vitamin B12 deficiency anemia due to intrinsic factor deficiency: Secondary | ICD-10-CM | POA: Diagnosis not present

## 2019-05-28 DIAGNOSIS — G2581 Restless legs syndrome: Secondary | ICD-10-CM | POA: Diagnosis not present

## 2019-05-28 DIAGNOSIS — I2510 Atherosclerotic heart disease of native coronary artery without angina pectoris: Secondary | ICD-10-CM | POA: Diagnosis not present

## 2019-05-28 DIAGNOSIS — M47816 Spondylosis without myelopathy or radiculopathy, lumbar region: Secondary | ICD-10-CM | POA: Diagnosis not present

## 2019-05-28 DIAGNOSIS — I10 Essential (primary) hypertension: Secondary | ICD-10-CM | POA: Diagnosis not present

## 2019-05-30 DIAGNOSIS — M5136 Other intervertebral disc degeneration, lumbar region: Secondary | ICD-10-CM | POA: Diagnosis not present

## 2019-05-30 DIAGNOSIS — J449 Chronic obstructive pulmonary disease, unspecified: Secondary | ICD-10-CM | POA: Diagnosis not present

## 2019-05-30 DIAGNOSIS — F325 Major depressive disorder, single episode, in full remission: Secondary | ICD-10-CM | POA: Diagnosis not present

## 2019-05-30 DIAGNOSIS — J9611 Chronic respiratory failure with hypoxia: Secondary | ICD-10-CM | POA: Diagnosis not present

## 2019-05-30 DIAGNOSIS — M25512 Pain in left shoulder: Secondary | ICD-10-CM | POA: Diagnosis not present

## 2019-05-30 DIAGNOSIS — Z0000 Encounter for general adult medical examination without abnormal findings: Secondary | ICD-10-CM | POA: Diagnosis not present

## 2019-05-30 DIAGNOSIS — G8929 Other chronic pain: Secondary | ICD-10-CM | POA: Diagnosis not present

## 2019-05-30 DIAGNOSIS — G2581 Restless legs syndrome: Secondary | ICD-10-CM | POA: Diagnosis not present

## 2019-05-30 DIAGNOSIS — M5416 Radiculopathy, lumbar region: Secondary | ICD-10-CM | POA: Diagnosis not present

## 2019-05-30 DIAGNOSIS — D649 Anemia, unspecified: Secondary | ICD-10-CM | POA: Diagnosis not present

## 2019-05-30 DIAGNOSIS — I2510 Atherosclerotic heart disease of native coronary artery without angina pectoris: Secondary | ICD-10-CM | POA: Diagnosis not present

## 2019-05-30 DIAGNOSIS — M25511 Pain in right shoulder: Secondary | ICD-10-CM | POA: Diagnosis not present

## 2019-06-03 DIAGNOSIS — J432 Centrilobular emphysema: Secondary | ICD-10-CM | POA: Diagnosis not present

## 2019-06-03 DIAGNOSIS — J449 Chronic obstructive pulmonary disease, unspecified: Secondary | ICD-10-CM | POA: Diagnosis not present

## 2019-06-03 DIAGNOSIS — J439 Emphysema, unspecified: Secondary | ICD-10-CM | POA: Diagnosis not present

## 2019-07-03 DIAGNOSIS — J449 Chronic obstructive pulmonary disease, unspecified: Secondary | ICD-10-CM | POA: Diagnosis not present

## 2019-07-03 DIAGNOSIS — R0600 Dyspnea, unspecified: Secondary | ICD-10-CM | POA: Diagnosis not present

## 2019-07-03 DIAGNOSIS — Z9981 Dependence on supplemental oxygen: Secondary | ICD-10-CM | POA: Diagnosis not present

## 2019-07-04 DIAGNOSIS — J432 Centrilobular emphysema: Secondary | ICD-10-CM | POA: Diagnosis not present

## 2019-07-04 DIAGNOSIS — J449 Chronic obstructive pulmonary disease, unspecified: Secondary | ICD-10-CM | POA: Diagnosis not present

## 2019-07-04 DIAGNOSIS — J439 Emphysema, unspecified: Secondary | ICD-10-CM | POA: Diagnosis not present

## 2019-07-09 DIAGNOSIS — J432 Centrilobular emphysema: Secondary | ICD-10-CM | POA: Diagnosis not present

## 2019-07-28 ENCOUNTER — Telehealth: Payer: Self-pay | Admitting: *Deleted

## 2019-07-28 NOTE — Telephone Encounter (Signed)
(  Per pt request to cx 07/30/19 lab and 7/23 MD/+/- Venofer appts and will contact the office at a later date to R/S.Marland Kitchen

## 2019-07-30 ENCOUNTER — Inpatient Hospital Stay: Payer: PPO

## 2019-08-01 ENCOUNTER — Inpatient Hospital Stay: Payer: PPO | Admitting: Oncology

## 2019-08-01 ENCOUNTER — Inpatient Hospital Stay: Payer: PPO

## 2019-08-08 DIAGNOSIS — J432 Centrilobular emphysema: Secondary | ICD-10-CM | POA: Diagnosis not present

## 2019-09-08 DIAGNOSIS — J432 Centrilobular emphysema: Secondary | ICD-10-CM | POA: Diagnosis not present

## 2019-10-02 ENCOUNTER — Other Ambulatory Visit: Payer: Self-pay | Admitting: Internal Medicine

## 2019-10-02 DIAGNOSIS — F325 Major depressive disorder, single episode, in full remission: Secondary | ICD-10-CM | POA: Diagnosis not present

## 2019-10-02 DIAGNOSIS — Z1231 Encounter for screening mammogram for malignant neoplasm of breast: Secondary | ICD-10-CM

## 2019-10-02 DIAGNOSIS — M5416 Radiculopathy, lumbar region: Secondary | ICD-10-CM | POA: Diagnosis not present

## 2019-10-02 DIAGNOSIS — G2581 Restless legs syndrome: Secondary | ICD-10-CM | POA: Diagnosis not present

## 2019-10-02 DIAGNOSIS — J449 Chronic obstructive pulmonary disease, unspecified: Secondary | ICD-10-CM | POA: Diagnosis not present

## 2019-10-02 DIAGNOSIS — M5136 Other intervertebral disc degeneration, lumbar region: Secondary | ICD-10-CM | POA: Diagnosis not present

## 2019-10-02 DIAGNOSIS — D649 Anemia, unspecified: Secondary | ICD-10-CM | POA: Diagnosis not present

## 2019-10-02 DIAGNOSIS — J9611 Chronic respiratory failure with hypoxia: Secondary | ICD-10-CM | POA: Diagnosis not present

## 2019-10-02 DIAGNOSIS — I2510 Atherosclerotic heart disease of native coronary artery without angina pectoris: Secondary | ICD-10-CM | POA: Diagnosis not present

## 2019-10-02 DIAGNOSIS — M25511 Pain in right shoulder: Secondary | ICD-10-CM | POA: Diagnosis not present

## 2019-10-09 DIAGNOSIS — J432 Centrilobular emphysema: Secondary | ICD-10-CM | POA: Diagnosis not present

## 2019-11-04 DIAGNOSIS — J209 Acute bronchitis, unspecified: Secondary | ICD-10-CM | POA: Diagnosis not present

## 2019-11-04 DIAGNOSIS — J9611 Chronic respiratory failure with hypoxia: Secondary | ICD-10-CM | POA: Diagnosis not present

## 2019-11-04 DIAGNOSIS — J440 Chronic obstructive pulmonary disease with acute lower respiratory infection: Secondary | ICD-10-CM | POA: Diagnosis not present

## 2019-11-04 DIAGNOSIS — I5022 Chronic systolic (congestive) heart failure: Secondary | ICD-10-CM | POA: Diagnosis not present

## 2019-11-08 DIAGNOSIS — J432 Centrilobular emphysema: Secondary | ICD-10-CM | POA: Diagnosis not present

## 2019-11-10 ENCOUNTER — Inpatient Hospital Stay: Payer: Medicare HMO | Attending: Oncology

## 2019-11-10 ENCOUNTER — Other Ambulatory Visit: Payer: Self-pay

## 2019-11-10 DIAGNOSIS — Z9049 Acquired absence of other specified parts of digestive tract: Secondary | ICD-10-CM

## 2019-11-10 DIAGNOSIS — M5136 Other intervertebral disc degeneration, lumbar region: Secondary | ICD-10-CM | POA: Insufficient documentation

## 2019-11-10 DIAGNOSIS — E785 Hyperlipidemia, unspecified: Secondary | ICD-10-CM | POA: Insufficient documentation

## 2019-11-10 DIAGNOSIS — Z8711 Personal history of peptic ulcer disease: Secondary | ICD-10-CM | POA: Diagnosis not present

## 2019-11-10 DIAGNOSIS — J439 Emphysema, unspecified: Secondary | ICD-10-CM | POA: Insufficient documentation

## 2019-11-10 DIAGNOSIS — E538 Deficiency of other specified B group vitamins: Secondary | ICD-10-CM | POA: Insufficient documentation

## 2019-11-10 DIAGNOSIS — I10 Essential (primary) hypertension: Secondary | ICD-10-CM | POA: Diagnosis not present

## 2019-11-10 DIAGNOSIS — Z86010 Personal history of colonic polyps: Secondary | ICD-10-CM | POA: Diagnosis not present

## 2019-11-10 DIAGNOSIS — D509 Iron deficiency anemia, unspecified: Secondary | ICD-10-CM | POA: Insufficient documentation

## 2019-11-10 DIAGNOSIS — D500 Iron deficiency anemia secondary to blood loss (chronic): Secondary | ICD-10-CM

## 2019-11-10 DIAGNOSIS — Z79899 Other long term (current) drug therapy: Secondary | ICD-10-CM | POA: Diagnosis not present

## 2019-11-10 LAB — COMPREHENSIVE METABOLIC PANEL
ALT: 19 U/L (ref 0–44)
AST: 18 U/L (ref 15–41)
Albumin: 3.9 g/dL (ref 3.5–5.0)
Alkaline Phosphatase: 48 U/L (ref 38–126)
Anion gap: 5 (ref 5–15)
BUN: 19 mg/dL (ref 8–23)
CO2: 31 mmol/L (ref 22–32)
Calcium: 9.1 mg/dL (ref 8.9–10.3)
Chloride: 100 mmol/L (ref 98–111)
Creatinine, Ser: 0.85 mg/dL (ref 0.44–1.00)
GFR, Estimated: >60 mL/min (ref >60)
Glucose, Bld: 89 mg/dL (ref 70–99)
Potassium: 3.9 mmol/L (ref 3.5–5.1)
Sodium: 136 mmol/L (ref 135–145)
Total Bilirubin: 0.7 mg/dL (ref 0.3–1.2)
Total Protein: 6.4 g/dL — ABNORMAL LOW (ref 6.5–8.1)

## 2019-11-10 LAB — CBC WITH DIFFERENTIAL/PLATELET
Abs Immature Granulocytes: 0.02 10*3/uL (ref 0.00–0.07)
Basophils Absolute: 0 10*3/uL (ref 0.0–0.1)
Basophils Relative: 1 %
Eosinophils Absolute: 0.1 10*3/uL (ref 0.0–0.5)
Eosinophils Relative: 3 %
HCT: 30.8 % — ABNORMAL LOW (ref 36.0–46.0)
Hemoglobin: 10.1 g/dL — ABNORMAL LOW (ref 12.0–15.0)
Immature Granulocytes: 0 %
Lymphocytes Relative: 36 %
Lymphs Abs: 1.7 10*3/uL (ref 0.7–4.0)
MCH: 28.6 pg (ref 26.0–34.0)
MCHC: 32.8 g/dL (ref 30.0–36.0)
MCV: 87.3 fL (ref 80.0–100.0)
Monocytes Absolute: 0.6 10*3/uL (ref 0.1–1.0)
Monocytes Relative: 13 %
Neutro Abs: 2.3 10*3/uL (ref 1.7–7.7)
Neutrophils Relative %: 47 %
Platelets: 213 10*3/uL (ref 150–400)
RBC: 3.53 MIL/uL — ABNORMAL LOW (ref 3.87–5.11)
RDW: 13.4 % (ref 11.5–15.5)
WBC: 4.7 10*3/uL (ref 4.0–10.5)
nRBC: 0 % (ref 0.0–0.2)

## 2019-11-10 LAB — FERRITIN: Ferritin: 7 ng/mL — ABNORMAL LOW (ref 11–307)

## 2019-11-10 LAB — IRON AND TIBC
Iron: 177 ug/dL — ABNORMAL HIGH (ref 28–170)
Saturation Ratios: 39 % — ABNORMAL HIGH (ref 10.4–31.8)
TIBC: 452 ug/dL — ABNORMAL HIGH (ref 250–450)
UIBC: 275 ug/dL

## 2019-11-10 LAB — VITAMIN B12: Vitamin B-12: 753 pg/mL (ref 180–914)

## 2019-11-10 LAB — FOLATE: Folate: 62 ng/mL (ref >5.9)

## 2019-11-11 ENCOUNTER — Encounter: Payer: Self-pay | Admitting: Oncology

## 2019-11-11 ENCOUNTER — Inpatient Hospital Stay (HOSPITAL_BASED_OUTPATIENT_CLINIC_OR_DEPARTMENT_OTHER): Payer: Medicare HMO | Admitting: Oncology

## 2019-11-11 ENCOUNTER — Other Ambulatory Visit: Payer: PPO

## 2019-11-11 ENCOUNTER — Ambulatory Visit: Payer: PPO | Admitting: Oncology

## 2019-11-11 ENCOUNTER — Inpatient Hospital Stay: Payer: Medicare HMO

## 2019-11-11 VITALS — BP 146/63 | HR 77 | Temp 97.5°F | Resp 18

## 2019-11-11 VITALS — BP 147/67 | HR 83 | Temp 98.1°F | Resp 18 | Wt 111.0 lb

## 2019-11-11 DIAGNOSIS — E538 Deficiency of other specified B group vitamins: Secondary | ICD-10-CM | POA: Diagnosis not present

## 2019-11-11 DIAGNOSIS — Z8711 Personal history of peptic ulcer disease: Secondary | ICD-10-CM | POA: Diagnosis not present

## 2019-11-11 DIAGNOSIS — D509 Iron deficiency anemia, unspecified: Secondary | ICD-10-CM | POA: Diagnosis not present

## 2019-11-11 DIAGNOSIS — I10 Essential (primary) hypertension: Secondary | ICD-10-CM | POA: Diagnosis not present

## 2019-11-11 DIAGNOSIS — Z9049 Acquired absence of other specified parts of digestive tract: Secondary | ICD-10-CM | POA: Diagnosis not present

## 2019-11-11 DIAGNOSIS — D500 Iron deficiency anemia secondary to blood loss (chronic): Secondary | ICD-10-CM | POA: Diagnosis not present

## 2019-11-11 DIAGNOSIS — M5136 Other intervertebral disc degeneration, lumbar region: Secondary | ICD-10-CM | POA: Diagnosis not present

## 2019-11-11 DIAGNOSIS — Z79899 Other long term (current) drug therapy: Secondary | ICD-10-CM | POA: Diagnosis not present

## 2019-11-11 DIAGNOSIS — Z86010 Personal history of colonic polyps: Secondary | ICD-10-CM | POA: Diagnosis not present

## 2019-11-11 DIAGNOSIS — E785 Hyperlipidemia, unspecified: Secondary | ICD-10-CM | POA: Diagnosis not present

## 2019-11-11 DIAGNOSIS — J439 Emphysema, unspecified: Secondary | ICD-10-CM | POA: Diagnosis not present

## 2019-11-11 MED ORDER — IRON SUCROSE 20 MG/ML IV SOLN
200.0000 mg | Freq: Once | INTRAVENOUS | Status: AC
  Administered 2019-11-11: 200 mg via INTRAVENOUS
  Filled 2019-11-11: qty 10

## 2019-11-11 MED ORDER — SODIUM CHLORIDE 0.9 % IV SOLN
Freq: Once | INTRAVENOUS | Status: AC
  Filled 2019-11-11: qty 250

## 2019-11-11 NOTE — Progress Notes (Signed)
[name]-  Cancer Center  Clinic day:  10/19/17  Chief Complaint: Tanya Rhodes is a 79 y.o. female with a history of iron deficiency anemia present for follow-up.  PERTINENT HEMATOLOGY HISTORY Patient was previously followed by Dr. Merlene Pulling.   Patient establish care with me on 05/30/2018 when she had a virtual visit with me. Extensive chart review was performed by me.  Patient had a history of gastric ulcer years ago and a colonic polyp status post partial colectomy in 2013.  Colonoscopy on 05/15/2013 reviewed patent ileocolonic anastomosis and internal hemorrhoids.  EGD on 05/15/2013 was normal.  Patient has iron deficiency and has been receiving IV Venofer if ferritin is less than 30. History of B12 deficiency is taking oral B12 supplementation.  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  Smoking history greater than 30-pack-year.  CT chest angiogram 04/01/2014 shows no pulmonary embolism but a 4 mm left upper lobe pulmonary nodule.  Chest CT to 03/04/18 17 revealed a stable 3 mm lingular nodule.  There were new groundglass opacities in the right upper lobe.  Right middle lobe and right lower lobe suggesting infectious or inflammatory process. CT chest 07/30/2015 revealed a stable 3 mm left lower lobe nodule. Patient follows up with [facility] gastroenterology Dr. Mechele Collin.  INTERVAL HISTORY Tanya Rhodes is a 79 y.o. female who has above history reviewed by me today presents for follow up visit for management of history of anemia. Problems and complaints are listed below:  She has chronic shortness of breath home oxygen. She denies any epigastric pain or discomfort, blood in the stool.  Patient reports feeling very tired and fatigued.  Past Medical History:  Diagnosis Date  . Allergic rhinitis   . Centrilobular emphysema (HCC)   . Chronic systolic CHF (congestive heart failure) (HCC)   . Colon polyposis   . COPD (chronic obstructive pulmonary disease)  (HCC)   . Coronary artery disease involving native coronary artery of native heart without angina pectoris   . DDD (degenerative disc disease), lumbar   . Essential hypertension   . HLD (hyperlipidemia)   . Hypertension   . IDA (iron deficiency anemia)   . Low serum vitamin D   . Lumbar radiculitis   . Lumbar spondylitis (HCC)   . Lumbar stenosis with neurogenic claudication   . Melena   . PUD (peptic ulcer disease)   . Restless leg   . Rotator cuff rupture   . VHD (valvular heart disease)     Past Surgical History:  Procedure Laterality Date  . BREAST BIOPSY Right 11/20/11   neg [place] core  . COLECTOMY    . CORONARY ANGIOPLASTY WITH STENT PLACEMENT      Family History  Problem Relation Age of Onset  . Heart disease Brother   . Heart disease Sister     Social History:  reports that she has quit smoking. She has never used smokeless tobacco. She reports that she does not drink alcohol and does not use drugs.  She has a greater than 30 pack year smoking history.  She quit smoking in 1995.    Allergies:  Allergies  Allergen Reactions  . Ropinirole Anxiety and Other (See Comments)    Reaction:  Severe leg pain   . Aspirin Other (See Comments)    GIB  . Ferrous Fumarate Itching  . Ferrous Sulfate Diarrhea and Nausea And Vomiting  . Omeprazole Nausea And Vomiting  . Oxycodone Other (See Comments)    Reaction:  Makes pt hyper   . Prednisone Other (See Comments)    Reaction:  Makes pt hyper     Current Medications: Current Outpatient Medications  Medication Sig Dispense Refill  . acetaminophen (TYLENOL) 500 MG tablet Take 500 mg by mouth every 6 (six) hours as needed for mild pain, moderate pain, fever or headache.     . albuterol (PROVENTIL HFA;VENTOLIN HFA) 108 (90 Base) MCG/ACT inhaler Inhale 2 puffs into the lungs every 4 (four) hours as needed for wheezing or shortness of breath.     Marland Kitchen albuterol (PROVENTIL) (2.5 MG/3ML) 0.083% nebulizer solution Take 2.5 mg by  nebulization every 6 (six) hours as needed for wheezing or shortness of breath.    . ALPRAZolam (XANAX) 0.25 MG tablet Take 0.25 mg by mouth at bedtime as needed for anxiety or sleep.    . budesonide-formoterol (SYMBICORT) 160-4.5 MCG/ACT inhaler Inhale 2 puffs into the lungs 2 (two) times daily.    . calcium citrate-vitamin D (CITRACAL+D) 315-200 MG-UNIT tablet Take 1 tablet by mouth daily.    . cyanocobalamin 1000 MCG tablet Take 1,000 mcg by mouth daily.    Marland Kitchen escitalopram (LEXAPRO) 10 MG tablet Take 10 mg by mouth daily.  0  . fluticasone (FLONASE) 50 MCG/ACT nasal spray Place 2 sprays into both nostrils daily.    . furosemide (LASIX) 20 MG tablet Take 20 mg by mouth daily.    Marland Kitchen gabapentin (NEURONTIN) 300 MG capsule Take by mouth.    Marland Kitchen lisinopril (PRINIVIL,ZESTRIL) 2.5 MG tablet Take 2.5 mg by mouth daily.    . metoprolol succinate (TOPROL-XL) 25 MG 24 hr tablet Take 25 mg by mouth daily.  1  . simvastatin (ZOCOR) 20 MG tablet Take 20 mg by mouth at bedtime.    Marland Kitchen SPIRIVA RESPIMAT 2.5 MCG/ACT AERS Inhale 2 puffs into the lungs daily.   0  . traMADol (ULTRAM) 50 MG tablet Take 75 mg by mouth 2 (two) times daily as needed for moderate pain.     . Vitamin D, Ergocalciferol, (DRISDOL) 50000 units CAPS capsule Take 50,000 Units by mouth every 7 (seven) days. Pt takes on Saturday.     No current facility-administered medications for this visit.    Review of Systems  Constitutional: Positive for fatigue. Negative for appetite change, chills and fever.  HENT:   Negative for hearing loss and voice change.   Eyes: Negative for eye problems.  Respiratory: Positive for shortness of breath. Negative for chest tightness and cough.   Cardiovascular: Negative for chest pain.  Gastrointestinal: Negative for abdominal distention, abdominal pain and blood in stool.  Endocrine: Negative for hot flashes.  Genitourinary: Negative for difficulty urinating and frequency.   Musculoskeletal: Negative for  arthralgias.  Skin: Negative for itching and rash.  Neurological: Negative for extremity weakness.  Hematological: Negative for adenopathy.  Psychiatric/Behavioral: Negative for confusion.      Physical Exam: Blood pressure (!) 147/67, pulse 83, temperature 98.1 F (36.7 C), resp. rate 18, weight 111 lb (50.3 kg). Physical Exam Constitutional:      General: She is not in acute distress.    Appearance: She is not diaphoretic.  HENT:     Head: Normocephalic and atraumatic.     Mouth/Throat:     Pharynx: No oropharyngeal exudate.  Eyes:     General: No scleral icterus.    Pupils: Pupils are equal, round, and reactive to light.  Cardiovascular:     Rate and Rhythm: Normal rate and regular rhythm.  Heart sounds: No murmur heard.   Pulmonary:     Effort: Pulmonary effort is normal. No respiratory distress.     Breath sounds: No rales.     Comments: Severely decreased breath sound bilaterally.  Patient breath via nasal cannula oxygen Chest:     Chest wall: No tenderness.  Abdominal:     General: There is no distension.     Palpations: Abdomen is soft.     Tenderness: There is no abdominal tenderness.  Musculoskeletal:        General: Normal range of motion.     Cervical back: Normal range of motion and neck supple.  Skin:    General: Skin is warm and dry.     Findings: No erythema.  Neurological:     Mental Status: She is alert and oriented to person, place, and time.     Cranial Nerves: No cranial nerve deficit.     Motor: No abnormal muscle tone.     Coordination: Coordination normal.  Psychiatric:        Mood and Affect: Affect normal.      Appointment on 11/10/2019  Component Date Value Ref Range Status  . Iron 11/10/2019 177* 28 - 170 ug/dL Final  . TIBC 86/76/1950 452* 250 - 450 ug/dL Final  . Saturation Ratios 11/10/2019 39* 10.4 - 31.8 % Final  . UIBC 11/10/2019 275  ug/dL Final   Performed at [facility], [address] [place], [place]  [address]  . Ferritin 11/10/2019 7* 11 - 307 ng/mL Final   Performed at [facility], [address] [place] [place], [place] [address]  . Vitamin B-12 11/10/2019 753  180 - 914 pg/mL Final   Comment: (NOTE) This assay is not validated for testing neonatal or myeloproliferative syndrome specimens for Vitamin B12 levels. Performed at [facility], [address] [place], [place] [address]   . Folate 11/10/2019 62.0  >5.9 ng/mL Final   Comment: RESULT CONFIRMED BY MANUAL DILUTION SS Performed at [facility], [address] [place] [place], [place] [address]   . Sodium 11/10/2019 136  135 - 145 mmol/L Final  . Potassium 11/10/2019 3.9  3.5 - 5.1 mmol/L Final  . Chloride 11/10/2019 100  98 - 111 mmol/L Final  . CO2 11/10/2019 31  22 - 32 mmol/L Final  . Glucose, Bld 11/10/2019 89  70 - 99 mg/dL Final   Glucose reference range applies only to samples taken after fasting for at least 8 hours.  . BUN 11/10/2019 19  8 - 23 mg/dL Final  . Creatinine, Ser 11/10/2019 0.85  0.44 - 1.00 mg/dL Final  . Calcium 53/97/6734 9.1  8.9 - 10.3 mg/dL Final  . Total Protein 11/10/2019 6.4* 6.5 - 8.1 g/dL Final  . Albumin 19/37/9024 3.9  3.5 - 5.0 g/dL Final  . AST 09/73/5329 18  15 - 41 U/L Final  . ALT 11/10/2019 19  0 - 44 U/L Final  . Alkaline Phosphatase 11/10/2019 48  38 - 126 U/L Final  . Total Bilirubin 11/10/2019 0.7  0.3 - 1.2 mg/dL Final  . GFR, Estimated 11/10/2019 >60  >60 mL/min Final   Comment: (NOTE) Calculated using the CKD-EPI Creatinine Equation (2021)   . Anion gap 11/10/2019 5  5 - 15 Final   Performed at [facility], [address] [place], [place] [address]  . WBC 11/10/2019 4.7  4.0 - 10.5 K/uL Final  . RBC 11/10/2019 3.53* 3.87 - 5.11 MIL/uL Final  .  Hemoglobin 11/10/2019 10.1* 12.0 - 15.0 g/dL Final  . HCT 40/98/119111/01/2019 30.8* 36 - 46 % Final  . MCV 11/10/2019 87.3  80.0 - 100.0 fL Final  . MCH 11/10/2019 28.6  26.0 - 34.0 pg Final  . MCHC 11/10/2019 32.8   30.0 - 36.0 g/dL Final  . RDW 47/82/956211/01/2019 13.4  11.5 - 15.5 % Final  . Platelets 11/10/2019 213  150 - 400 K/uL Final  . nRBC 11/10/2019 0.0  0.0 - 0.2 % Final  . Neutrophils Relative % 11/10/2019 47  % Final  . Neutro Abs 11/10/2019 2.3  1.7 - 7.7 K/uL Final  . Lymphocytes Relative 11/10/2019 36  % Final  . Lymphs Abs 11/10/2019 1.7  0.7 - 4.0 K/uL Final  . Monocytes Relative 11/10/2019 13  % Final  . Monocytes Absolute 11/10/2019 0.6  0.1 - 1.0 K/uL Final  . Eosinophils Relative 11/10/2019 3  % Final  . Eosinophils Absolute 11/10/2019 0.1  0.0 - 0.5 K/uL Final  . Basophils Relative 11/10/2019 1  % Final  . Basophils Absolute 11/10/2019 0.0  0.0 - 0.1 K/uL Final  . Immature Granulocytes 11/10/2019 0  % Final  . Abs Immature Granulocytes 11/10/2019 0.02  0.00 - 0.07 K/uL Final   Performed at [facility], [address] [place], [place] [id number]    Assessment:  Tanya Rhodes is a 79 y.o. female with iron deficiency.  She has a history of bleeding gastric ulcer years ago and colonic polyps s/p partial colectomy in 2013.    1. Iron deficiency anemia due to chronic blood loss   2. History of partial colectomy   3. B12 deficiency     #History of iron deficiency anemia. Labs are reviewed and discussed with patient. Hemoglobin has decreased to 10.1, iron saturation 39, ferritin decreased to 7, increased TIBC 452. This is mostly consistent with iron deficiency anemia. Recommend IV Venofer weekly x3.  She has a history of gastric ulcer and history of partial colectomy. I recommend patient to continue follow-up with gastroenterology for further evaluation.   # Vitamin B12 deficiency, B12 level has been normal.  Continue oral vitamin B12 oral supplement   Follow  Up in 2 months.   We spent sufficient time to discuss many aspect of care, questions were answered to patient's satisfaction.  Rickard PatienceZhou Evah Rashid, MD  10/19/2017, 10:47 PM

## 2019-11-11 NOTE — Progress Notes (Signed)
Pt tolerated infusion well. No s/s of distress or reaction noted. Pt and VS stable at discharge.  

## 2019-11-11 NOTE — Progress Notes (Signed)
Patient has an increased in her chronic SOBr today.

## 2019-11-18 DIAGNOSIS — D500 Iron deficiency anemia secondary to blood loss (chronic): Secondary | ICD-10-CM | POA: Diagnosis not present

## 2019-11-18 DIAGNOSIS — Z86010 Personal history of colonic polyps: Secondary | ICD-10-CM | POA: Diagnosis not present

## 2019-11-18 DIAGNOSIS — R1033 Periumbilical pain: Secondary | ICD-10-CM | POA: Diagnosis not present

## 2019-11-20 ENCOUNTER — Inpatient Hospital Stay: Payer: Medicare HMO

## 2019-11-20 ENCOUNTER — Other Ambulatory Visit: Payer: Self-pay

## 2019-11-20 VITALS — BP 129/57 | HR 74

## 2019-11-20 DIAGNOSIS — E538 Deficiency of other specified B group vitamins: Secondary | ICD-10-CM | POA: Diagnosis not present

## 2019-11-20 DIAGNOSIS — Z8711 Personal history of peptic ulcer disease: Secondary | ICD-10-CM | POA: Diagnosis not present

## 2019-11-20 DIAGNOSIS — M5136 Other intervertebral disc degeneration, lumbar region: Secondary | ICD-10-CM | POA: Diagnosis not present

## 2019-11-20 DIAGNOSIS — D509 Iron deficiency anemia, unspecified: Secondary | ICD-10-CM

## 2019-11-20 DIAGNOSIS — Z86010 Personal history of colonic polyps: Secondary | ICD-10-CM | POA: Diagnosis not present

## 2019-11-20 DIAGNOSIS — J439 Emphysema, unspecified: Secondary | ICD-10-CM | POA: Diagnosis not present

## 2019-11-20 DIAGNOSIS — Z79899 Other long term (current) drug therapy: Secondary | ICD-10-CM | POA: Diagnosis not present

## 2019-11-20 DIAGNOSIS — I10 Essential (primary) hypertension: Secondary | ICD-10-CM | POA: Diagnosis not present

## 2019-11-20 DIAGNOSIS — E785 Hyperlipidemia, unspecified: Secondary | ICD-10-CM | POA: Diagnosis not present

## 2019-11-20 MED ORDER — IRON SUCROSE 20 MG/ML IV SOLN
200.0000 mg | Freq: Once | INTRAVENOUS | Status: AC
  Administered 2019-11-20: 200 mg via INTRAVENOUS
  Filled 2019-11-20: qty 10

## 2019-11-20 MED ORDER — SODIUM CHLORIDE 0.9 % IV SOLN
Freq: Once | INTRAVENOUS | Status: AC
  Filled 2019-11-20: qty 250

## 2019-11-27 ENCOUNTER — Ambulatory Visit
Admission: RE | Admit: 2019-11-27 | Discharge: 2019-11-27 | Disposition: A | Discharge status: Home / Self Care | Payer: Medicare HMO | Source: Ambulatory Visit | Attending: Internal Medicine | Admitting: Internal Medicine

## 2019-11-27 ENCOUNTER — Other Ambulatory Visit: Payer: Self-pay

## 2019-11-27 DIAGNOSIS — Z1231 Encounter for screening mammogram for malignant neoplasm of breast: Secondary | ICD-10-CM | POA: Insufficient documentation

## 2019-11-28 ENCOUNTER — Inpatient Hospital Stay: Payer: Medicare HMO

## 2019-12-02 ENCOUNTER — Encounter: Payer: Self-pay | Admitting: *Deleted

## 2019-12-05 ENCOUNTER — Other Ambulatory Visit: Payer: Self-pay

## 2019-12-05 ENCOUNTER — Other Ambulatory Visit
Admission: RE | Admit: 2019-12-05 | Discharge: 2019-12-05 | Disposition: A | Discharge status: Home / Self Care | Payer: Medicare HMO | Source: Ambulatory Visit | Attending: Gastroenterology | Admitting: Gastroenterology

## 2019-12-05 DIAGNOSIS — Z01812 Encounter for preprocedural laboratory examination: Secondary | ICD-10-CM | POA: Insufficient documentation

## 2019-12-05 DIAGNOSIS — Z20822 Contact with and (suspected) exposure to covid-19: Secondary | ICD-10-CM | POA: Diagnosis not present

## 2019-12-06 LAB — SARS CORONAVIRUS 2 (TAT 6-24 HRS): SARS Coronavirus 2: NEGATIVE

## 2019-12-08 ENCOUNTER — Ambulatory Visit: Payer: Medicare HMO | Admitting: Certified Registered"

## 2019-12-08 ENCOUNTER — Other Ambulatory Visit: Payer: Self-pay

## 2019-12-08 ENCOUNTER — Encounter
Admission: RE | Disposition: A | Discharge status: Home / Self Care | Payer: Self-pay | Source: Home / Self Care | Attending: Gastroenterology

## 2019-12-08 ENCOUNTER — Ambulatory Visit
Admission: RE | Admit: 2019-12-08 | Discharge: 2019-12-08 | Disposition: A | Discharge status: Home / Self Care | Payer: Medicare HMO | Attending: Gastroenterology | Admitting: Gastroenterology

## 2019-12-08 ENCOUNTER — Encounter: Payer: Self-pay | Admitting: *Deleted

## 2019-12-08 DIAGNOSIS — K649 Unspecified hemorrhoids: Secondary | ICD-10-CM | POA: Diagnosis not present

## 2019-12-08 DIAGNOSIS — K253 Acute gastric ulcer without hemorrhage or perforation: Secondary | ICD-10-CM | POA: Diagnosis not present

## 2019-12-08 DIAGNOSIS — K5790 Diverticulosis of intestine, part unspecified, without perforation or abscess without bleeding: Secondary | ICD-10-CM | POA: Diagnosis not present

## 2019-12-08 DIAGNOSIS — Z87891 Personal history of nicotine dependence: Secondary | ICD-10-CM | POA: Diagnosis not present

## 2019-12-08 DIAGNOSIS — J432 Centrilobular emphysema: Secondary | ICD-10-CM | POA: Diagnosis not present

## 2019-12-08 DIAGNOSIS — Z79899 Other long term (current) drug therapy: Secondary | ICD-10-CM | POA: Diagnosis not present

## 2019-12-08 DIAGNOSIS — Z9049 Acquired absence of other specified parts of digestive tract: Secondary | ICD-10-CM | POA: Diagnosis not present

## 2019-12-08 DIAGNOSIS — K3189 Other diseases of stomach and duodenum: Secondary | ICD-10-CM | POA: Diagnosis not present

## 2019-12-08 DIAGNOSIS — I5022 Chronic systolic (congestive) heart failure: Secondary | ICD-10-CM | POA: Diagnosis not present

## 2019-12-08 DIAGNOSIS — K5730 Diverticulosis of large intestine without perforation or abscess without bleeding: Secondary | ICD-10-CM | POA: Diagnosis not present

## 2019-12-08 DIAGNOSIS — K2970 Gastritis, unspecified, without bleeding: Secondary | ICD-10-CM | POA: Diagnosis not present

## 2019-12-08 DIAGNOSIS — Z7951 Long term (current) use of inhaled steroids: Secondary | ICD-10-CM | POA: Insufficient documentation

## 2019-12-08 DIAGNOSIS — K640 First degree hemorrhoids: Secondary | ICD-10-CM | POA: Diagnosis not present

## 2019-12-08 DIAGNOSIS — Z9981 Dependence on supplemental oxygen: Secondary | ICD-10-CM | POA: Diagnosis not present

## 2019-12-08 DIAGNOSIS — K2100 Gastro-esophageal reflux disease with esophagitis, without bleeding: Secondary | ICD-10-CM | POA: Diagnosis not present

## 2019-12-08 DIAGNOSIS — Z980 Intestinal bypass and anastomosis status: Secondary | ICD-10-CM | POA: Insufficient documentation

## 2019-12-08 DIAGNOSIS — K319 Disease of stomach and duodenum, unspecified: Secondary | ICD-10-CM | POA: Insufficient documentation

## 2019-12-08 DIAGNOSIS — Z86010 Personal history of colonic polyps: Secondary | ICD-10-CM | POA: Diagnosis not present

## 2019-12-08 DIAGNOSIS — K2289 Other specified disease of esophagus: Secondary | ICD-10-CM | POA: Diagnosis not present

## 2019-12-08 DIAGNOSIS — I110 Hypertensive heart disease with heart failure: Secondary | ICD-10-CM | POA: Diagnosis not present

## 2019-12-08 DIAGNOSIS — K209 Esophagitis, unspecified without bleeding: Secondary | ICD-10-CM | POA: Diagnosis not present

## 2019-12-08 DIAGNOSIS — E785 Hyperlipidemia, unspecified: Secondary | ICD-10-CM | POA: Diagnosis not present

## 2019-12-08 DIAGNOSIS — D509 Iron deficiency anemia, unspecified: Secondary | ICD-10-CM | POA: Diagnosis not present

## 2019-12-08 HISTORY — DX: Anxiety disorder, unspecified: F41.9

## 2019-12-08 HISTORY — DX: Gastro-esophageal reflux disease without esophagitis: K21.9

## 2019-12-08 HISTORY — PX: ESOPHAGOGASTRODUODENOSCOPY (EGD) WITH PROPOFOL: SHX5813

## 2019-12-08 HISTORY — PX: COLONOSCOPY WITH PROPOFOL: SHX5780

## 2019-12-08 HISTORY — DX: Cardiac arrhythmia, unspecified: I49.9

## 2019-12-08 SURGERY — COLONOSCOPY WITH PROPOFOL
Anesthesia: General

## 2019-12-08 MED ORDER — PROPOFOL 500 MG/50ML IV EMUL
INTRAVENOUS | Status: DC | PRN
  Administered 2019-12-08: 120 ug/kg/min via INTRAVENOUS

## 2019-12-08 MED ORDER — SODIUM CHLORIDE 0.9 % IV SOLN: INTRAVENOUS | Status: DC | PRN

## 2019-12-08 MED ORDER — SODIUM CHLORIDE 0.9 % IV SOLN: INTRAVENOUS | Status: DC

## 2019-12-08 NOTE — H&P (Signed)
Outpatient short stay form Pre-procedure 12/08/2019 1:22 PM Merlyn Lot MD, MPH  Primary Physician: Dr. Marcello Fennel  Reason for visit:  IDA  History of present illness:   79 y/o lady with history of large polyps requiring hemicolectomy (right) and COPD on 02 here for EGD/Colonoscopy for further evaluation. Has family history of cancer but known if colon cancer.    Current Facility-Administered Medications:  .  0.9 %  sodium chloride infusion, , Intravenous, Continuous, Lynanne Delgreco, Rossie Muskrat, MD  Facility-Administered Medications Ordered in Other Encounters:  .  0.9 %  sodium chloride infusion, , Intravenous, Continuous PRN, Jaye Beagle, CRNA, New Bag at 12/08/19 1308  Medications Prior to Admission  Medication Sig Dispense Refill Last Dose  . acetaminophen (TYLENOL) 500 MG tablet Take 500 mg by mouth every 6 (six) hours as needed for mild pain, moderate pain, fever or headache.    12/07/2019 at 0800  . albuterol (PROVENTIL) (2.5 MG/3ML) 0.083% nebulizer solution Take 2.5 mg by nebulization every 6 (six) hours as needed for wheezing or shortness of breath.   12/08/2019 at 0930  . ALPRAZolam (XANAX) 0.25 MG tablet Take 0.25 mg by mouth at bedtime as needed for anxiety or sleep.   12/07/2019 at 2000  . budesonide-formoterol (SYMBICORT) 160-4.5 MCG/ACT inhaler Inhale 2 puffs into the lungs 2 (two) times daily.   12/07/2019 at 2000  . calcium citrate-vitamin D (CITRACAL+D) 315-200 MG-UNIT tablet Take 1 tablet by mouth daily.   12/07/2019 at 0800  . cyanocobalamin 1000 MCG tablet Take 1,000 mcg by mouth daily.   12/07/2019 at 0800  . escitalopram (LEXAPRO) 10 MG tablet Take 10 mg by mouth daily.  0 12/07/2019 at 0800  . fluticasone (FLONASE) 50 MCG/ACT nasal spray Place 2 sprays into both nostrils daily.   12/07/2019 at 0800  . furosemide (LASIX) 20 MG tablet Take 20 mg by mouth daily.   12/07/2019 at 0800  . gabapentin (NEURONTIN) 300 MG capsule Take by mouth.   12/07/2019 at 2000  .  lisinopril (PRINIVIL,ZESTRIL) 2.5 MG tablet Take 2.5 mg by mouth daily.   12/07/2019 at 0800  . metoprolol succinate (TOPROL-XL) 25 MG 24 hr tablet Take 25 mg by mouth daily.  1 12/07/2019 at 0800  . simvastatin (ZOCOR) 20 MG tablet Take 20 mg by mouth at bedtime.   12/07/2019 at 2000  . SPIRIVA RESPIMAT 2.5 MCG/ACT AERS Inhale 2 puffs into the lungs daily.   0 12/08/2019 at 0800  . traMADol (ULTRAM) 50 MG tablet Take 75 mg by mouth 2 (two) times daily as needed for moderate pain.    12/07/2019 at 0800  . Vitamin D, Ergocalciferol, (DRISDOL) 50000 units CAPS capsule Take 50,000 Units by mouth every 7 (seven) days. Pt takes on Saturday.   12/07/2019 at 0800  . albuterol (PROVENTIL HFA;VENTOLIN HFA) 108 (90 Base) MCG/ACT inhaler Inhale 2 puffs into the lungs every 4 (four) hours as needed for wheezing or shortness of breath.  (Patient not taking: Reported on 12/08/2019)   Not Taking at Unknown time     Allergies  Allergen Reactions  . Aspirin Other (See Comments)    GIB  . Ropinirole Anxiety and Other (See Comments)    Reaction:  Severe leg pain   . Ferrous Fumarate Itching  . Ferrous Sulfate Diarrhea and Nausea And Vomiting  . Omeprazole Nausea And Vomiting  . Oxycodone Other (See Comments)    Reaction:  Makes pt hyper   . Prednisone Other (See Comments)    Reaction:  Makes pt hyper      Past Medical History:  Diagnosis Date  . Allergic rhinitis   . Anxiety   . Centrilobular emphysema (HCC)   . Chronic systolic CHF (congestive heart failure) (HCC)   . Colon polyposis   . COPD (chronic obstructive pulmonary disease) (HCC)   . Coronary artery disease involving native coronary artery of native heart without angina pectoris   . DDD (degenerative disc disease), lumbar   . Dysrhythmia   . Essential hypertension   . GERD (gastroesophageal reflux disease)   . HLD (hyperlipidemia)   . Hypertension   . IDA (iron deficiency anemia)   . Low serum vitamin D   . Lumbar radiculitis   .  Lumbar spondylitis (HCC)   . Lumbar stenosis with neurogenic claudication   . Melena   . PUD (peptic ulcer disease)   . Restless leg   . Rotator cuff rupture   . VHD (valvular heart disease)     Review of systems:  Otherwise negative.    Physical Exam  Gen: Alert, oriented. Appears stated age.  HEENT: PERRLA. Lungs: No respiratory distress but on 02 CV: RRR Abd: soft, benign, no masses.  Ext: No edema.     Planned procedures: Proceed with colonoscopy. The patient understands the nature of the planned procedure, indications, risks, alternatives and potential complications including but not limited to bleeding, infection, perforation, damage to internal organs and possible oversedation/side effects from anesthesia. The patient agrees and gives consent to proceed.  Please refer to procedure notes for findings, recommendations and patient disposition/instructions.     Merlyn Lot MD, MPH Gastroenterology 12/08/2019  1:22 PM

## 2019-12-08 NOTE — Op Note (Signed)
[facility] Gastroenterology Patient Name: Tanya Rhodes Procedure Date: 12/08/2019 1:14 PM MRN: [id number] Account #: [id number] Date of Birth: 1940/03/08 Admit Type: Outpatient Age: 79 Room: [facility] ENDO ROOM 3 Gender: Female Note Status: Finalized Procedure:             Upper GI endoscopy Indications:           Iron deficiency anemia Providers:             Andrey Farmer MD, MD Medicines:             Monitored Anesthesia Care Complications:         No immediate complications. Estimated blood loss:                         Minimal. Procedure:             Pre-Anesthesia Assessment:                        - Prior to the procedure, a History and Physical was                         performed, and patient medications and allergies were                         reviewed. The patient is competent. The risks and                         benefits of the procedure and the sedation options and                         risks were discussed with the patient. All questions                         were answered and informed consent was obtained.                         Patient identification and proposed procedure were                         verified by the physician, the nurse, the anesthetist                         and the technician in the endoscopy suite. Mental                         Status Examination: alert and oriented. Airway                         Examination: normal oropharyngeal airway and neck                         mobility. Respiratory Examination: clear to                         auscultation. CV Examination: normal. Prophylactic                         Antibiotics: The patient does not require prophylactic  antibiotics. Prior Anticoagulants: The patient has                         taken no previous anticoagulant or antiplatelet                         agents. ASA Grade Assessment: III - A patient with                         severe  systemic disease. After reviewing the risks and                         benefits, the patient was deemed in satisfactory                         condition to undergo the procedure. The anesthesia                         plan was to use monitored anesthesia care (MAC).                         Immediately prior to administration of medications,                         the patient was re-assessed for adequacy to receive                         sedatives. The heart rate, respiratory rate, oxygen                         saturations, blood pressure, adequacy of pulmonary                         ventilation, and response to care were monitored                         throughout the procedure. The physical status of the                         patient was re-assessed after the procedure.                        After obtaining informed consent, the endoscope was                         passed under direct vision. Throughout the procedure,                         the patient's blood pressure, pulse, and oxygen                         saturations were monitored continuously. The Endoscope                         was introduced through the mouth, and advanced to the                         second part of duodenum. The upper GI endoscopy was  accomplished without difficulty. The patient tolerated                         the procedure well. Findings:      LA Grade B (one or more mucosal breaks greater than 5 mm, not extending       between the tops of two mucosal folds) esophagitis with no bleeding was       found. Biopsies were taken with a cold forceps for histology. Estimated       blood loss was minimal.      White nummular lesions were noted in the middle third of the esophagus.       Biopsies were taken with a cold forceps for histology. Estimated blood       loss was minimal.      Hematin (altered blood/coffee-ground-like material) was found in the       gastric antrum.  Biopsies were taken with a cold forceps for Helicobacter       pylori testing. Estimated blood loss was minimal.      The examined duodenum was normal. Impression:            - LA Grade B reflux esophagitis with no bleeding.                         Biopsied.                        - White nummular lesions in esophageal mucosa.                         Biopsied.                        - Hematin (altered blood/coffee-ground-like material)                         in the gastric antrum. Biopsied.                        - Normal examined duodenum. Recommendation:        - Discharge patient to home.                        - Resume previous diet.                        - Continue present medications.                        - Await pathology results.                        - Use Protonix (pantoprazole) 20 mg PO daily.                        - Return to referring physician as previously                         scheduled. Procedure Code(s):     --- Professional ---                        [phone], Esophagogastroduodenoscopy, flexible,  transoral; with biopsy, single or multiple Diagnosis Code(s):     --- Professional ---                        K21.00, Gastro-esophageal reflux disease with                         esophagitis, without bleeding                        K22.8, Other specified diseases of esophagus                        K92.2, Gastrointestinal hemorrhage, unspecified                        D50.9, Iron deficiency anemia, unspecified CPT copyright 2019 [employer] Medical [employer]. All rights reserved. The codes documented in this report are preliminary and upon coder review may  be revised to meet current compliance requirements. Andrey Farmer, MD Andrey Farmer MD, MD 12/08/2019 2:10:59 PM Number of Addenda: 0 Note Initiated On: 12/08/2019 1:14 PM Estimated Blood Loss:  Estimated blood loss was minimal.      [facility]

## 2019-12-08 NOTE — Op Note (Signed)
[facility]enterology Patient Name: Tanya Rhodes Procedure Date: 12/08/2019 1:13 PM MRN: [id number] Account #: [id number] Date of Birth: 06-22-1940 Admit Type: Outpatient Age: 79 Room: [facility] ENDO ROOM 3 Gender: Female Note Status: Finalized Procedure:             Colonoscopy Indications:           Iron deficiency anemia Providers:             Andrey Farmer MD, MD Medicines:             Monitored Anesthesia Care Complications:         No immediate complications. Procedure:             Pre-Anesthesia Assessment:                        - Prior to the procedure, a History and Physical was                         performed, and patient medications and allergies were                         reviewed. The patient is competent. The risks and                         benefits of the procedure and the sedation options and                         risks were discussed with the patient. All questions                         were answered and informed consent was obtained.                         Patient identification and proposed procedure were                         verified by the physician, the nurse, the anesthetist                         and the technician in the endoscopy suite. Mental                         Status Examination: alert and oriented. Airway                         Examination: normal oropharyngeal airway and neck                         mobility. Respiratory Examination: clear to                         auscultation. CV Examination: normal. Prophylactic                         Antibiotics: The patient does not require prophylactic                         antibiotics. Prior Anticoagulants: The patient has  taken no previous anticoagulant or antiplatelet                         agents. ASA Grade Assessment: III - A patient with                         severe systemic disease. After reviewing the risks and                          benefits, the patient was deemed in satisfactory                         condition to undergo the procedure. The anesthesia                         plan was to use monitored anesthesia care (MAC).                         Immediately prior to administration of medications,                         the patient was re-assessed for adequacy to receive                         sedatives. The heart rate, respiratory rate, oxygen                         saturations, blood pressure, adequacy of pulmonary                         ventilation, and response to care were monitored                         throughout the procedure. The physical status of the                         patient was re-assessed after the procedure.                        After obtaining informed consent, the colonoscope was                         passed under direct vision. Throughout the procedure,                         the patient's blood pressure, pulse, and oxygen                         saturations were monitored continuously. The                         Colonoscope was introduced through the anus and                         advanced to the the ileocolonic anastomosis. The                         colonoscopy was performed without difficulty. The  patient tolerated the procedure well. The quality of                         the bowel preparation was adequate to identify polyps. Findings:      The perianal and digital rectal examinations were normal.      There was evidence of a prior end-to-side ileo-colonic anastomosis in       the ascending colon. This was patent and was characterized by healthy       appearing mucosa.      A single small-mouthed diverticulum was found in the sigmoid colon.      Non-bleeding internal hemorrhoids were found during retroflexion. The       hemorrhoids were Grade I (internal hemorrhoids that do not prolapse).      The exam was otherwise without abnormality on  direct and retroflexion       views. Impression:            - Patent end-to-side ileo-colonic anastomosis,                         characterized by healthy appearing mucosa.                        - Diverticulosis in the sigmoid colon.                        - Non-bleeding internal hemorrhoids.                        - The examination was otherwise normal on direct and                         retroflexion views.                        - No specimens collected. Recommendation:        - Discharge patient to home.                        - Resume previous diet.                        - Continue present medications.                        - Repeat colonoscopy is not recommended due to current                         age (37 years or older) for surveillance.                        - Return to referring physician as previously                         scheduled. Procedure Code(s):     --- Professional ---                        [phone], Colonoscopy, flexible; diagnostic, including                         collection of specimen(s) by brushing or  washing, when                         performed (separate procedure) Diagnosis Code(s):     --- Professional ---                        Z98.0, Intestinal bypass and anastomosis status                        K64.0, First degree hemorrhoids                        D50.9, Iron deficiency anemia, unspecified                        K57.30, Diverticulosis of large intestine without                         perforation or abscess without bleeding CPT copyright 2019 American Medical Association. All rights reserved. The codes documented in this report are preliminary and upon coder review may  be revised to meet current compliance requirements. Andrey Farmer, MD Andrey Farmer MD, MD 12/08/2019 2:07:10 PM Number of Addenda: 0 Note Initiated On: 12/08/2019 1:13 PM Scope Withdrawal Time: 0 hours 7 minutes 1 second  Total Procedure Duration: 0 hours 11  minutes 0 seconds  Estimated Blood Loss:  Estimated blood loss: none. Estimated blood loss: none.      [facility]

## 2019-12-08 NOTE — Transfer of Care (Signed)
Immediate Anesthesia Transfer of Care Note  Patient: Tanya Rhodes  Procedure(s) Performed: COLONOSCOPY WITH PROPOFOL (N/A ) ESOPHAGOGASTRODUODENOSCOPY (EGD) WITH PROPOFOL (N/A )  Patient Location: PACU and Endoscopy Unit  Anesthesia Type:General  Level of Consciousness: drowsy  Airway & Oxygen Therapy: Patient Spontanous Breathing  Post-op Assessment: Report given to RN  Post vital signs: stable  Last Vitals:  Vitals Value Taken Time  BP    Temp    Pulse    Resp    SpO2      Last Pain:  Vitals:   12/08/19 1257  TempSrc: Tympanic  PainSc: 0-No pain         Complications: No complications documented.

## 2019-12-08 NOTE — Anesthesia Postprocedure Evaluation (Signed)
Anesthesia Post Note  Patient: KENISE BARRACO  Procedure(s) Performed: COLONOSCOPY WITH PROPOFOL (N/A ) ESOPHAGOGASTRODUODENOSCOPY (EGD) WITH PROPOFOL (N/A )  Patient location during evaluation: Endoscopy Anesthesia Type: General Level of consciousness: awake and awake and alert Pain management: pain level controlled Vital Signs Assessment: post-procedure vital signs reviewed and stable Respiratory status: spontaneous breathing Cardiovascular status: blood pressure returned to baseline Postop Assessment: no apparent nausea or vomiting Anesthetic complications: no   No complications documented.   Last Vitals:  Vitals:   12/08/19 1257 12/08/19 1400  BP: 140/85   Pulse: 92   Resp: 18   Temp: (!) 36.2 C 36.7 C  SpO2: 100%     Last Pain:  Vitals:   12/08/19 1420  TempSrc:   PainSc: 0-No pain                 Emilio Math

## 2019-12-08 NOTE — Anesthesia Preprocedure Evaluation (Signed)
Anesthesia Evaluation  Patient identified by MRN, date of birth, ID band Patient awake    Reviewed: Allergy & Precautions, NPO status , Patient's Chart, lab work & pertinent test results  Airway Mallampati: II       Dental  (+) Upper Dentures, Edentulous Lower   Pulmonary shortness of breath, COPD, former smoker,    Pulmonary exam normal breath sounds clear to auscultation       Cardiovascular hypertension, + CAD and +CHF  Normal cardiovascular exam+ dysrhythmias  Rhythm:Regular Rate:Normal     Neuro/Psych PSYCHIATRIC DISORDERS Anxiety  Neuromuscular disease    GI/Hepatic Neg liver ROS, PUD, GERD  ,  Endo/Other  negative endocrine ROS  Renal/GU negative Renal ROS  negative genitourinary   Musculoskeletal negative musculoskeletal ROS (+)   Abdominal   Peds negative pediatric ROS (+)  Hematology negative hematology ROS (+)   Anesthesia Other Findings   Reproductive/Obstetrics negative OB ROS                             Anesthesia Physical Anesthesia Plan  ASA: III  Anesthesia Plan: General   Post-op Pain Management:    Induction: Intravenous  PONV Risk Score and Plan: 3 and Propofol infusion  Airway Management Planned: Nasal Cannula  Additional Equipment:   Intra-op Plan:   Post-operative Plan:   Informed Consent: I have reviewed the patients History and Physical, chart, labs and discussed the procedure including the risks, benefits and alternatives for the proposed anesthesia with the patient or authorized representative who has indicated his/her understanding and acceptance.       Plan Discussed with: CRNA, Anesthesiologist and Surgeon  Anesthesia Plan Comments:         Anesthesia Quick Evaluation

## 2019-12-09 ENCOUNTER — Encounter: Payer: Self-pay | Admitting: Gastroenterology

## 2019-12-09 DIAGNOSIS — J432 Centrilobular emphysema: Secondary | ICD-10-CM | POA: Diagnosis not present

## 2019-12-11 LAB — SURGICAL PATHOLOGY

## 2020-01-01 ENCOUNTER — Inpatient Hospital Stay: Payer: Medicare HMO | Attending: Oncology

## 2020-01-06 ENCOUNTER — Inpatient Hospital Stay: Payer: Medicare HMO | Admitting: Oncology

## 2020-01-06 ENCOUNTER — Inpatient Hospital Stay: Payer: Medicare HMO

## 2020-01-08 DIAGNOSIS — J432 Centrilobular emphysema: Secondary | ICD-10-CM | POA: Diagnosis not present

## 2020-02-06 ENCOUNTER — Inpatient Hospital Stay: Payer: Medicare HMO | Attending: Oncology

## 2020-02-06 DIAGNOSIS — D509 Iron deficiency anemia, unspecified: Secondary | ICD-10-CM | POA: Insufficient documentation

## 2020-02-06 DIAGNOSIS — Z8711 Personal history of peptic ulcer disease: Secondary | ICD-10-CM | POA: Insufficient documentation

## 2020-02-06 DIAGNOSIS — I5022 Chronic systolic (congestive) heart failure: Secondary | ICD-10-CM | POA: Diagnosis not present

## 2020-02-06 DIAGNOSIS — J449 Chronic obstructive pulmonary disease, unspecified: Secondary | ICD-10-CM | POA: Diagnosis not present

## 2020-02-06 DIAGNOSIS — R911 Solitary pulmonary nodule: Secondary | ICD-10-CM | POA: Diagnosis not present

## 2020-02-06 DIAGNOSIS — I110 Hypertensive heart disease with heart failure: Secondary | ICD-10-CM | POA: Diagnosis not present

## 2020-02-06 DIAGNOSIS — K2100 Gastro-esophageal reflux disease with esophagitis, without bleeding: Secondary | ICD-10-CM | POA: Diagnosis not present

## 2020-02-06 DIAGNOSIS — Z9049 Acquired absence of other specified parts of digestive tract: Secondary | ICD-10-CM | POA: Diagnosis not present

## 2020-02-06 DIAGNOSIS — Z87891 Personal history of nicotine dependence: Secondary | ICD-10-CM | POA: Insufficient documentation

## 2020-02-06 DIAGNOSIS — I2510 Atherosclerotic heart disease of native coronary artery without angina pectoris: Secondary | ICD-10-CM | POA: Insufficient documentation

## 2020-02-06 DIAGNOSIS — K2951 Unspecified chronic gastritis with bleeding: Secondary | ICD-10-CM | POA: Diagnosis not present

## 2020-02-06 DIAGNOSIS — F419 Anxiety disorder, unspecified: Secondary | ICD-10-CM | POA: Diagnosis not present

## 2020-02-06 DIAGNOSIS — R0602 Shortness of breath: Secondary | ICD-10-CM | POA: Diagnosis not present

## 2020-02-06 DIAGNOSIS — J439 Emphysema, unspecified: Secondary | ICD-10-CM | POA: Diagnosis not present

## 2020-02-06 DIAGNOSIS — E785 Hyperlipidemia, unspecified: Secondary | ICD-10-CM | POA: Insufficient documentation

## 2020-02-06 DIAGNOSIS — Z79899 Other long term (current) drug therapy: Secondary | ICD-10-CM | POA: Diagnosis not present

## 2020-02-06 DIAGNOSIS — E538 Deficiency of other specified B group vitamins: Secondary | ICD-10-CM

## 2020-02-06 DIAGNOSIS — D500 Iron deficiency anemia secondary to blood loss (chronic): Secondary | ICD-10-CM

## 2020-02-06 LAB — CBC WITH DIFFERENTIAL/PLATELET
Abs Immature Granulocytes: 0.02 10*3/uL (ref 0.00–0.07)
Basophils Absolute: 0 10*3/uL (ref 0.0–0.1)
Basophils Relative: 1 %
Eosinophils Absolute: 0.1 10*3/uL (ref 0.0–0.5)
Eosinophils Relative: 3 %
HCT: 35.2 % — ABNORMAL LOW (ref 36.0–46.0)
Hemoglobin: 11.6 g/dL — ABNORMAL LOW (ref 12.0–15.0)
Immature Granulocytes: 1 %
Lymphocytes Relative: 40 %
Lymphs Abs: 1.8 10*3/uL (ref 0.7–4.0)
MCH: 29.1 pg (ref 26.0–34.0)
MCHC: 33 g/dL (ref 30.0–36.0)
MCV: 88.2 fL (ref 80.0–100.0)
Monocytes Absolute: 0.4 10*3/uL (ref 0.1–1.0)
Monocytes Relative: 8 %
Neutro Abs: 2.1 10*3/uL (ref 1.7–7.7)
Neutrophils Relative %: 47 %
Platelets: 222 10*3/uL (ref 150–400)
RBC: 3.99 MIL/uL (ref 3.87–5.11)
RDW: 13.8 % (ref 11.5–15.5)
WBC: 4.4 10*3/uL (ref 4.0–10.5)
nRBC: 0 % (ref 0.0–0.2)

## 2020-02-06 LAB — FERRITIN: Ferritin: 43 ng/mL (ref 11–307)

## 2020-02-06 LAB — IRON AND TIBC
Iron: 81 ug/dL (ref 28–170)
Saturation Ratios: 21 % (ref 10.4–31.8)
TIBC: 392 ug/dL (ref 250–450)
UIBC: 311 ug/dL

## 2020-02-08 DIAGNOSIS — J432 Centrilobular emphysema: Secondary | ICD-10-CM | POA: Diagnosis not present

## 2020-02-09 ENCOUNTER — Encounter: Payer: Self-pay | Admitting: Oncology

## 2020-02-09 ENCOUNTER — Inpatient Hospital Stay: Payer: Medicare HMO

## 2020-02-09 ENCOUNTER — Inpatient Hospital Stay (HOSPITAL_BASED_OUTPATIENT_CLINIC_OR_DEPARTMENT_OTHER): Payer: Medicare HMO | Admitting: Oncology

## 2020-02-09 ENCOUNTER — Other Ambulatory Visit: Payer: Self-pay

## 2020-02-09 VITALS — BP 156/81 | HR 91 | Temp 98.1°F | Resp 18 | Wt 112.1 lb

## 2020-02-09 DIAGNOSIS — K2951 Unspecified chronic gastritis with bleeding: Secondary | ICD-10-CM | POA: Diagnosis not present

## 2020-02-09 DIAGNOSIS — J439 Emphysema, unspecified: Secondary | ICD-10-CM | POA: Diagnosis not present

## 2020-02-09 DIAGNOSIS — Z79899 Other long term (current) drug therapy: Secondary | ICD-10-CM | POA: Diagnosis not present

## 2020-02-09 DIAGNOSIS — R0602 Shortness of breath: Secondary | ICD-10-CM | POA: Diagnosis not present

## 2020-02-09 DIAGNOSIS — D509 Iron deficiency anemia, unspecified: Secondary | ICD-10-CM

## 2020-02-09 DIAGNOSIS — R911 Solitary pulmonary nodule: Secondary | ICD-10-CM | POA: Diagnosis not present

## 2020-02-09 DIAGNOSIS — J449 Chronic obstructive pulmonary disease, unspecified: Secondary | ICD-10-CM | POA: Diagnosis not present

## 2020-02-09 DIAGNOSIS — Z9049 Acquired absence of other specified parts of digestive tract: Secondary | ICD-10-CM | POA: Diagnosis not present

## 2020-02-09 DIAGNOSIS — I5022 Chronic systolic (congestive) heart failure: Secondary | ICD-10-CM | POA: Diagnosis not present

## 2020-02-09 DIAGNOSIS — F419 Anxiety disorder, unspecified: Secondary | ICD-10-CM | POA: Diagnosis not present

## 2020-02-09 NOTE — Progress Notes (Signed)
Patient denies new problems/concerns today.   °

## 2020-02-09 NOTE — Progress Notes (Signed)
[name]-  Cancer Center  Clinic day:  10/19/17  Chief Complaint: Tanya Rhodes is a 80 y.o. female with a history of iron deficiency anemia present for follow-up.  PERTINENT HEMATOLOGY HISTORY Patient was previously followed by Dr. Merlene Pulling.   Patient establish care with me on 05/30/2018 when she had a virtual visit with me. Extensive chart review was performed by me.  Patient had a history of gastric ulcer years ago and a colonic polyp status post partial colectomy in 2013.  Colonoscopy on 05/15/2013 reviewed patent ileocolonic anastomosis and internal hemorrhoids.  EGD on 05/15/2013 was normal.  Patient has iron deficiency and has been receiving IV Venofer if ferritin is less than 30. History of B12 deficiency is taking oral B12 supplementation.  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  Smoking history greater than 30-pack-year.  CT chest angiogram 04/01/2014 shows no pulmonary embolism but a 4 mm left upper lobe pulmonary nodule.  Chest CT to 03/04/18 17 revealed a stable 3 mm lingular nodule.  There were new groundglass opacities in the right upper lobe.  Right middle lobe and right lower lobe suggesting infectious or inflammatory process. CT chest 07/30/2015 revealed a stable 3 mm left lower lobe nodule. Patient follows up with [facility] gastroenterology Dr. Mechele Collin.  INTERVAL HISTORY KILYNN FITZSIMMONS is a 80 y.o. female who has above history reviewed by me today presents for follow up visit for management of history of anemia. Problems and complaints are listed below:  She has chronic shortness of breath home oxygen. Reports feeling at the baseline.  No black or bloody stool.  Fatigue is at baseline.  Not worse. Marland Kitchen  Past Medical History:  Diagnosis Date  . Allergic rhinitis   . Anxiety   . Centrilobular emphysema (HCC)   . Chronic systolic CHF (congestive heart failure) (HCC)   . Colon polyposis   . COPD (chronic obstructive pulmonary disease) (HCC)    . Coronary artery disease involving native coronary artery of native heart without angina pectoris   . DDD (degenerative disc disease), lumbar   . Dysrhythmia   . Essential hypertension   . GERD (gastroesophageal reflux disease)   . HLD (hyperlipidemia)   . Hypertension   . IDA (iron deficiency anemia)   . Low serum vitamin D   . Lumbar radiculitis   . Lumbar spondylitis (HCC)   . Lumbar stenosis with neurogenic claudication   . Melena   . PUD (peptic ulcer disease)   . Restless leg   . Rotator cuff rupture   . VHD (valvular heart disease)     Past Surgical History:  Procedure Laterality Date  . ABDOMINAL HYSTERECTOMY    . APPENDECTOMY    . BREAST BIOPSY Right 11/20/11   neg [place] core  . COLECTOMY    . COLONOSCOPY WITH PROPOFOL N/A 12/08/2019   Procedure: COLONOSCOPY WITH PROPOFOL;  Surgeon: Regis Bill, MD;  Location: [facility] ENDOSCOPY;  Service: Endoscopy;  Laterality: N/A;  . CORONARY ANGIOPLASTY WITH STENT PLACEMENT    . ESOPHAGOGASTRODUODENOSCOPY (EGD) WITH PROPOFOL N/A 12/08/2019   Procedure: ESOPHAGOGASTRODUODENOSCOPY (EGD) WITH PROPOFOL;  Surgeon: Regis Bill, MD;  Location: [facility] ENDOSCOPY;  Service: Endoscopy;  Laterality: N/A;  . TONSILLECTOMY      Family History  Problem Relation Age of Onset  . Heart disease Brother   . Heart disease Sister     Social History:  reports that she quit smoking about 26 years ago. Her smoking use included cigarettes. She has a  78.75 pack-year smoking history. She has never used smokeless tobacco. She reports that she does not drink alcohol and does not use drugs.  She has a greater than 30 pack year smoking history.  She quit smoking in 1995.    Allergies:  Allergies  Allergen Reactions  . Aspirin Other (See Comments)    GIB  . Ropinirole Anxiety and Other (See Comments)    Reaction:  Severe leg pain   . Ferrous Fumarate Itching  . Ferrous Sulfate Diarrhea and Nausea And Vomiting  . Omeprazole Nausea And  Vomiting  . Oxycodone Other (See Comments)    Reaction:  Makes pt hyper   . Prednisone Other (See Comments)    Reaction:  Makes pt hyper     Current Medications: Current Outpatient Medications  Medication Sig Dispense Refill  . acetaminophen (TYLENOL) 500 MG tablet Take 500 mg by mouth every 6 (six) hours as needed for mild pain, moderate pain, fever or headache.     . albuterol (PROVENTIL) (2.5 MG/3ML) 0.083% nebulizer solution Take 2.5 mg by nebulization every 6 (six) hours as needed for wheezing or shortness of breath.    . ALPRAZolam (XANAX) 0.25 MG tablet Take 0.25 mg by mouth at bedtime as needed for anxiety or sleep.    . budesonide-formoterol (SYMBICORT) 160-4.5 MCG/ACT inhaler Inhale 2 puffs into the lungs 2 (two) times daily.    . calcium citrate-vitamin D (CITRACAL+D) 315-200 MG-UNIT tablet Take 1 tablet by mouth daily.    . cyanocobalamin 1000 MCG tablet Take 1,000 mcg by mouth daily.    Marland Kitchen escitalopram (LEXAPRO) 10 MG tablet Take 10 mg by mouth daily.  0  . fluticasone (FLONASE) 50 MCG/ACT nasal spray Place 2 sprays into both nostrils daily.    . furosemide (LASIX) 20 MG tablet Take 20 mg by mouth daily.    Marland Kitchen gabapentin (NEURONTIN) 300 MG capsule Take by mouth.    Marland Kitchen lisinopril (PRINIVIL,ZESTRIL) 2.5 MG tablet Take 2.5 mg by mouth daily.    . metoprolol succinate (TOPROL-XL) 25 MG 24 hr tablet Take 25 mg by mouth daily.  1  . simvastatin (ZOCOR) 20 MG tablet Take 20 mg by mouth at bedtime.    Marland Kitchen SPIRIVA RESPIMAT 2.5 MCG/ACT AERS Inhale 2 puffs into the lungs daily.   0  . traMADol (ULTRAM) 50 MG tablet Take 75 mg by mouth 2 (two) times daily as needed for moderate pain.     . Vitamin D, Ergocalciferol, (DRISDOL) 50000 units CAPS capsule Take 50,000 Units by mouth every 7 (seven) days. Pt takes on Saturday.    Marland Kitchen albuterol (PROVENTIL HFA;VENTOLIN HFA) 108 (90 Base) MCG/ACT inhaler Inhale 2 puffs into the lungs every 4 (four) hours as needed for wheezing or shortness of breath.   (Patient not taking: No sig reported)     No current facility-administered medications for this visit.    Review of Systems  Constitutional: Positive for fatigue. Negative for appetite change, chills and fever.  HENT:   Negative for hearing loss and voice change.   Eyes: Negative for eye problems.  Respiratory: Positive for shortness of breath. Negative for chest tightness and cough.   Cardiovascular: Negative for chest pain.  Gastrointestinal: Negative for abdominal distention, abdominal pain and blood in stool.  Endocrine: Negative for hot flashes.  Genitourinary: Negative for difficulty urinating and frequency.   Musculoskeletal: Negative for arthralgias.  Skin: Negative for itching and rash.  Neurological: Negative for extremity weakness.  Hematological: Negative for adenopathy.  Psychiatric/Behavioral: Negative  for confusion.      Physical Exam: Blood pressure (!) 156/81, pulse 91, temperature 98.1 F (36.7 C), resp. rate 18, weight 112 lb 1.6 oz (50.8 kg). Physical Exam Constitutional:      General: She is not in acute distress.    Appearance: She is not diaphoretic.  HENT:     Head: Normocephalic and atraumatic.     Mouth/Throat:     Pharynx: No oropharyngeal exudate.  Eyes:     General: No scleral icterus.    Pupils: Pupils are equal, round, and reactive to light.  Cardiovascular:     Rate and Rhythm: Normal rate and regular rhythm.     Heart sounds: No murmur heard.   Pulmonary:     Effort: Pulmonary effort is normal. No respiratory distress.     Breath sounds: No rales.     Comments: Severely decreased breath sound bilaterally.  Patient breath via nasal cannula oxygen Chest:     Chest wall: No tenderness.  Abdominal:     General: There is no distension.     Palpations: Abdomen is soft.     Tenderness: There is no abdominal tenderness.  Musculoskeletal:        General: Normal range of motion.     Cervical back: Normal range of motion and neck supple.   Skin:    General: Skin is warm and dry.     Findings: No erythema.  Neurological:     Mental Status: She is alert and oriented to person, place, and time.     Cranial Nerves: No cranial nerve deficit.     Motor: No abnormal muscle tone.     Coordination: Coordination normal.  Psychiatric:        Mood and Affect: Affect normal.      No visits with results within 3 Day(s) from this visit.  Latest known visit with results is:  Appointment on 02/06/2020  Component Date Value Ref Range Status  . WBC 02/06/2020 4.4  4.0 - 10.5 K/uL Final  . RBC 02/06/2020 3.99  3.87 - 5.11 MIL/uL Final  . Hemoglobin 02/06/2020 11.6* 12.0 - 15.0 g/dL Final  . HCT 33/61/2244 35.2* 36.0 - 46.0 % Final  . MCV 02/06/2020 88.2  80.0 - 100.0 fL Final  . MCH 02/06/2020 29.1  26.0 - 34.0 pg Final  . MCHC 02/06/2020 33.0  30.0 - 36.0 g/dL Final  . RDW 97/53/0051 13.8  11.5 - 15.5 % Final  . Platelets 02/06/2020 222  150 - 400 K/uL Final  . nRBC 02/06/2020 0.0  0.0 - 0.2 % Final  . Neutrophils Relative % 02/06/2020 47  % Final  . Neutro Abs 02/06/2020 2.1  1.7 - 7.7 K/uL Final  . Lymphocytes Relative 02/06/2020 40  % Final  . Lymphs Abs 02/06/2020 1.8  0.7 - 4.0 K/uL Final  . Monocytes Relative 02/06/2020 8  % Final  . Monocytes Absolute 02/06/2020 0.4  0.1 - 1.0 K/uL Final  . Eosinophils Relative 02/06/2020 3  % Final  . Eosinophils Absolute 02/06/2020 0.1  0.0 - 0.5 K/uL Final  . Basophils Relative 02/06/2020 1  % Final  . Basophils Absolute 02/06/2020 0.0  0.0 - 0.1 K/uL Final  . Immature Granulocytes 02/06/2020 1  % Final  . Abs Immature Granulocytes 02/06/2020 0.02  0.00 - 0.07 K/uL Final   Performed at [facility], [address], [place], [place] [address]  . Iron 02/06/2020 81  28 - 170 ug/dL Final  . TIBC  02/06/2020 392  250 - 450 ug/dL Final  . Saturation Ratios 02/06/2020 21  10.4 - 31.8 % Final  . UIBC 02/06/2020 311  ug/dL Final   Performed at [facility], [address] [place] [place], [place] [address]  . Ferritin 02/06/2020 43  11 - 307 ng/mL Final   Performed at [facility], [address] [place], [place] [address]    Assessment:  REYANNA BALEY is a 80 y.o. female with iron deficiency.  She has a history of bleeding gastric ulcer years ago and colonic polyps s/p partial colectomy in 2013.    1. Iron deficiency anemia, unspecified iron deficiency anemia type   2. Other chronic gastritis with hemorrhage     #History of iron deficiency anemia. Labs are reviewed and discussed with patient Hemoglobin has improved to 11.6 today.  Iron panel has also improved. Ferritin is 43, iron saturation 21. Hold additional IV Venofer today. Recommend patient to take oral iron supplementation 1-2 times per week . She has a history of gastric ulcer and history of partial colectomy. Patient has a repeat EGD and colonoscopy in November 2021. EGD showed esophagitis, hematin in the gastric antrum.  Pathology showed reactive gastropathy with focal ulceration and active inflammation.  Colonoscopy showed a diverticulosis.  Nonbleeding internal hemorrhoids.  No specimen was collected. Continue follow-up with gastroenterology.  Follow  Up in 4 months.   We spent sufficient time to discuss many aspect of care, questions were answered to patient's satisfaction.  Rickard Patience, MD  10/19/2017, 10:58 PM

## 2020-02-16 DIAGNOSIS — J449 Chronic obstructive pulmonary disease, unspecified: Secondary | ICD-10-CM | POA: Diagnosis not present

## 2020-02-16 DIAGNOSIS — M5410 Radiculopathy, site unspecified: Secondary | ICD-10-CM | POA: Diagnosis not present

## 2020-02-16 DIAGNOSIS — I5020 Unspecified systolic (congestive) heart failure: Secondary | ICD-10-CM | POA: Diagnosis not present

## 2020-02-16 DIAGNOSIS — M25512 Pain in left shoulder: Secondary | ICD-10-CM | POA: Diagnosis not present

## 2020-02-16 DIAGNOSIS — I110 Hypertensive heart disease with heart failure: Secondary | ICD-10-CM | POA: Diagnosis not present

## 2020-02-16 DIAGNOSIS — F32A Depression, unspecified: Secondary | ICD-10-CM | POA: Diagnosis not present

## 2020-02-16 DIAGNOSIS — Z0000 Encounter for general adult medical examination without abnormal findings: Secondary | ICD-10-CM | POA: Diagnosis not present

## 2020-02-16 DIAGNOSIS — D509 Iron deficiency anemia, unspecified: Secondary | ICD-10-CM | POA: Diagnosis not present

## 2020-02-16 DIAGNOSIS — J9611 Chronic respiratory failure with hypoxia: Secondary | ICD-10-CM | POA: Diagnosis not present

## 2020-03-08 DIAGNOSIS — J432 Centrilobular emphysema: Secondary | ICD-10-CM | POA: Diagnosis not present

## 2020-03-17 DIAGNOSIS — Z9981 Dependence on supplemental oxygen: Secondary | ICD-10-CM | POA: Diagnosis not present

## 2020-03-17 DIAGNOSIS — R0600 Dyspnea, unspecified: Secondary | ICD-10-CM | POA: Diagnosis not present

## 2020-03-17 DIAGNOSIS — J9611 Chronic respiratory failure with hypoxia: Secondary | ICD-10-CM | POA: Diagnosis not present

## 2020-04-07 DIAGNOSIS — J432 Centrilobular emphysema: Secondary | ICD-10-CM | POA: Diagnosis not present

## 2020-05-08 DIAGNOSIS — J432 Centrilobular emphysema: Secondary | ICD-10-CM | POA: Diagnosis not present

## 2020-06-03 ENCOUNTER — Other Ambulatory Visit: Payer: Self-pay

## 2020-06-03 DIAGNOSIS — D509 Iron deficiency anemia, unspecified: Secondary | ICD-10-CM

## 2020-06-04 ENCOUNTER — Inpatient Hospital Stay: Payer: Medicare HMO | Attending: Oncology

## 2020-06-04 DIAGNOSIS — Z79899 Other long term (current) drug therapy: Secondary | ICD-10-CM | POA: Diagnosis not present

## 2020-06-04 DIAGNOSIS — Z87891 Personal history of nicotine dependence: Secondary | ICD-10-CM | POA: Diagnosis not present

## 2020-06-04 DIAGNOSIS — Z8711 Personal history of peptic ulcer disease: Secondary | ICD-10-CM | POA: Diagnosis not present

## 2020-06-04 DIAGNOSIS — K219 Gastro-esophageal reflux disease without esophagitis: Secondary | ICD-10-CM | POA: Insufficient documentation

## 2020-06-04 DIAGNOSIS — E785 Hyperlipidemia, unspecified: Secondary | ICD-10-CM | POA: Insufficient documentation

## 2020-06-04 DIAGNOSIS — I10 Essential (primary) hypertension: Secondary | ICD-10-CM | POA: Insufficient documentation

## 2020-06-04 DIAGNOSIS — R000 Tachycardia, unspecified: Secondary | ICD-10-CM | POA: Insufficient documentation

## 2020-06-04 DIAGNOSIS — J432 Centrilobular emphysema: Secondary | ICD-10-CM | POA: Diagnosis not present

## 2020-06-04 DIAGNOSIS — D509 Iron deficiency anemia, unspecified: Secondary | ICD-10-CM | POA: Insufficient documentation

## 2020-06-04 LAB — CBC WITH DIFFERENTIAL/PLATELET
Abs Immature Granulocytes: 0.01 10*3/uL (ref 0.00–0.07)
Basophils Absolute: 0 10*3/uL (ref 0.0–0.1)
Basophils Relative: 0 %
Eosinophils Absolute: 0.1 10*3/uL (ref 0.0–0.5)
Eosinophils Relative: 3 %
HCT: 36.8 % (ref 36.0–46.0)
Hemoglobin: 12.3 g/dL (ref 12.0–15.0)
Immature Granulocytes: 0 %
Lymphocytes Relative: 40 %
Lymphs Abs: 1.9 10*3/uL (ref 0.7–4.0)
MCH: 28.8 pg (ref 26.0–34.0)
MCHC: 33.4 g/dL (ref 30.0–36.0)
MCV: 86.2 fL (ref 80.0–100.0)
Monocytes Absolute: 0.5 10*3/uL (ref 0.1–1.0)
Monocytes Relative: 11 %
Neutro Abs: 2.2 10*3/uL (ref 1.7–7.7)
Neutrophils Relative %: 46 %
Platelets: 233 10*3/uL (ref 150–400)
RBC: 4.27 MIL/uL (ref 3.87–5.11)
RDW: 12.7 % (ref 11.5–15.5)
WBC: 4.8 10*3/uL (ref 4.0–10.5)
nRBC: 0 % (ref 0.0–0.2)

## 2020-06-04 LAB — IRON AND TIBC
Iron: 98 ug/dL (ref 28–170)
Saturation Ratios: 23 % (ref 10.4–31.8)
TIBC: 426 ug/dL (ref 250–450)
UIBC: 328 ug/dL

## 2020-06-04 LAB — FERRITIN: Ferritin: 20 ng/mL (ref 11–307)

## 2020-06-07 DIAGNOSIS — J432 Centrilobular emphysema: Secondary | ICD-10-CM | POA: Diagnosis not present

## 2020-06-08 ENCOUNTER — Inpatient Hospital Stay (HOSPITAL_BASED_OUTPATIENT_CLINIC_OR_DEPARTMENT_OTHER): Payer: Medicare HMO | Admitting: Oncology

## 2020-06-08 ENCOUNTER — Other Ambulatory Visit: Payer: Self-pay

## 2020-06-08 ENCOUNTER — Inpatient Hospital Stay: Payer: Medicare HMO

## 2020-06-08 ENCOUNTER — Encounter: Payer: Self-pay | Admitting: Oncology

## 2020-06-08 VITALS — BP 170/84 | HR 121 | Temp 96.8°F | Resp 18 | Wt 112.3 lb

## 2020-06-08 DIAGNOSIS — Z9049 Acquired absence of other specified parts of digestive tract: Secondary | ICD-10-CM

## 2020-06-08 DIAGNOSIS — Z87891 Personal history of nicotine dependence: Secondary | ICD-10-CM | POA: Diagnosis not present

## 2020-06-08 DIAGNOSIS — D509 Iron deficiency anemia, unspecified: Secondary | ICD-10-CM | POA: Diagnosis not present

## 2020-06-08 DIAGNOSIS — D500 Iron deficiency anemia secondary to blood loss (chronic): Secondary | ICD-10-CM | POA: Diagnosis not present

## 2020-06-08 DIAGNOSIS — Z79899 Other long term (current) drug therapy: Secondary | ICD-10-CM | POA: Diagnosis not present

## 2020-06-08 DIAGNOSIS — R000 Tachycardia, unspecified: Secondary | ICD-10-CM | POA: Diagnosis not present

## 2020-06-08 DIAGNOSIS — K219 Gastro-esophageal reflux disease without esophagitis: Secondary | ICD-10-CM | POA: Diagnosis not present

## 2020-06-08 DIAGNOSIS — I10 Essential (primary) hypertension: Secondary | ICD-10-CM | POA: Diagnosis not present

## 2020-06-08 DIAGNOSIS — Z8711 Personal history of peptic ulcer disease: Secondary | ICD-10-CM | POA: Diagnosis not present

## 2020-06-08 DIAGNOSIS — E785 Hyperlipidemia, unspecified: Secondary | ICD-10-CM | POA: Diagnosis not present

## 2020-06-08 DIAGNOSIS — J432 Centrilobular emphysema: Secondary | ICD-10-CM | POA: Diagnosis not present

## 2020-06-08 NOTE — Progress Notes (Signed)
Tanya Rhodes. [facility]-  Cancer Center  Clinic day:  10/19/17  Chief Complaint: Tanya Rhodes is a 80 y.o. female with a history of iron deficiency anemia present for follow-up.  PERTINENT HEMATOLOGY HISTORY Patient was previously followed by Dr. Merlene Pullingorcoran.   Patient establish care with me on 05/30/2018 when she had a virtual visit with me. Extensive chart review was performed by me.  Patient had a history of gastric ulcer years ago and a colonic polyp status post partial colectomy in 2013.  Colonoscopy on 05/15/2013 reviewed patent ileocolonic anastomosis and internal hemorrhoids.  EGD on 05/15/2013 was normal.  Patient has iron deficiency and has been receiving IV Venofer if ferritin is less than 30. History of B12 deficiency is taking oral B12 supplementation.  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  Smoking history greater than 30-pack-year.  CT chest angiogram 04/01/2014 shows no pulmonary embolism but a 4 mm left upper lobe pulmonary nodule.  Chest CT to 03/04/18 17 revealed a stable 3 mm lingular nodule.  There were new groundglass opacities in the right upper lobe.  Right middle lobe and right lower lobe suggesting infectious or inflammatory process. CT chest 07/30/2015 revealed a stable 3 mm left lower lobe nodule. Patient follows up with [facility] gastroenterology Dr. Mechele CollinElliott.  INTERVAL HISTORY Tanya Rhodes is a 80 y.o. female who has above history reviewed by me today presents for follow up visit for management of history of anemia. Problems and complaints are listed below:  She has chronic shortness of breath home oxygen.  Patient denies any black or bloody stool.  Chronic fatigue at baseline. Heart rate was high in the clinic, initially 121, nadir 118.  She denies any palpitation or chest pain. .  Past Medical History:  Diagnosis Date  . Allergic rhinitis   . Anxiety   . Centrilobular emphysema (HCC)   . Chronic systolic CHF (congestive heart failure)  (HCC)   . Colon polyposis   . COPD (chronic obstructive pulmonary disease) (HCC)   . Coronary artery disease involving native coronary artery of native heart without angina pectoris   . DDD (degenerative disc disease), lumbar   . Dysrhythmia   . Essential hypertension   . GERD (gastroesophageal reflux disease)   . HLD (hyperlipidemia)   . Hypertension   . IDA (iron deficiency anemia)   . Low serum vitamin D   . Lumbar radiculitis   . Lumbar spondylitis (HCC)   . Lumbar stenosis with neurogenic claudication   . Melena   . PUD (peptic ulcer disease)   . Restless leg   . Rotator cuff rupture   . VHD (valvular heart disease)     Past Surgical History:  Procedure Laterality Date  . ABDOMINAL HYSTERECTOMY    . APPENDECTOMY    . BREAST BIOPSY Right 11/20/11   neg [place] core  . COLECTOMY    . COLONOSCOPY WITH PROPOFOL N/A 12/08/2019   Procedure: COLONOSCOPY WITH PROPOFOL;  Surgeon: Regis BillLocklear, Cameron T, MD;  Location: [facility] ENDOSCOPY;  Service: Endoscopy;  Laterality: N/A;  . CORONARY ANGIOPLASTY WITH STENT PLACEMENT    . ESOPHAGOGASTRODUODENOSCOPY (EGD) WITH PROPOFOL N/A 12/08/2019   Procedure: ESOPHAGOGASTRODUODENOSCOPY (EGD) WITH PROPOFOL;  Surgeon: Regis BillLocklear, Cameron T, MD;  Location: [facility] ENDOSCOPY;  Service: Endoscopy;  Laterality: N/A;  . TONSILLECTOMY      Family History  Problem Relation Age of Onset  . Heart disease Brother   . Heart disease Sister     Social History:  reports that she quit  smoking about 26 years ago. Her smoking use included cigarettes. She has a 78.75 pack-year smoking history. She has never used smokeless tobacco. She reports that she does not drink alcohol and does not use drugs.  She has a greater than 30 pack year smoking history.  She quit smoking in 1995.    Allergies:  Allergies  Allergen Reactions  . Aspirin Other (See Comments)    GIB  . Ropinirole Anxiety and Other (See Comments)    Reaction:  Severe leg pain   . Ferrous Fumarate Itching   . Ferrous Sulfate Diarrhea and Nausea And Vomiting  . Omeprazole Nausea And Vomiting  . Oxycodone Other (See Comments)    Reaction:  Makes pt hyper   . Prednisone Other (See Comments)    Reaction:  Makes pt hyper     Current Medications: Current Outpatient Medications  Medication Sig Dispense Refill  . acetaminophen (TYLENOL) 500 MG tablet Take 500 mg by mouth every 6 (six) hours as needed for mild pain, moderate pain, fever or headache.     . albuterol (PROVENTIL) (2.5 MG/3ML) 0.083% nebulizer solution Take 2.5 mg by nebulization every 6 (six) hours as needed for wheezing or shortness of breath.    . ALPRAZolam (XANAX) 0.25 MG tablet Take 0.25 mg by mouth at bedtime as needed for anxiety or sleep.    . budesonide-formoterol (SYMBICORT) 160-4.5 MCG/ACT inhaler Inhale 2 puffs into the lungs 2 (two) times daily.    . calcium citrate-vitamin D (CITRACAL+D) 315-200 MG-UNIT tablet Take 1 tablet by mouth daily.    . cyanocobalamin 1000 MCG tablet Take 1,000 mcg by mouth daily.    Marland Kitchen escitalopram (LEXAPRO) 10 MG tablet Take 10 mg by mouth daily.  0  . fluticasone (FLONASE) 50 MCG/ACT nasal spray Place 2 sprays into both nostrils daily.    . furosemide (LASIX) 20 MG tablet Take 20 mg by mouth daily.    Marland Kitchen lisinopril (PRINIVIL,ZESTRIL) 2.5 MG tablet Take 2.5 mg by mouth daily.    . metoprolol succinate (TOPROL-XL) 25 MG 24 hr tablet Take 25 mg by mouth daily.  1  . simvastatin (ZOCOR) 20 MG tablet Take 20 mg by mouth at bedtime.    Marland Kitchen SPIRIVA RESPIMAT 2.5 MCG/ACT AERS Inhale 2 puffs into the lungs daily.   0  . traMADol (ULTRAM) 50 MG tablet Take 75 mg by mouth 2 (two) times daily as needed for moderate pain.     . Vitamin D, Ergocalciferol, (DRISDOL) 50000 units CAPS capsule Take 50,000 Units by mouth every 7 (seven) days. Pt takes on Saturday.    Marland Kitchen albuterol (PROVENTIL HFA;VENTOLIN HFA) 108 (90 Base) MCG/ACT inhaler Inhale 2 puffs into the lungs every 4 (four) hours as needed for wheezing or  shortness of breath.  (Patient not taking: No sig reported)     No current facility-administered medications for this visit.    Review of Systems  Constitutional: Positive for fatigue. Negative for appetite change, chills and fever.  HENT:   Negative for hearing loss and voice change.   Eyes: Negative for eye problems.  Respiratory: Positive for shortness of breath. Negative for chest tightness and cough.   Cardiovascular: Negative for chest pain.  Gastrointestinal: Negative for abdominal distention, abdominal pain and blood in stool.  Endocrine: Negative for hot flashes.  Genitourinary: Negative for difficulty urinating and frequency.   Musculoskeletal: Negative for arthralgias.  Skin: Negative for itching and rash.  Neurological: Negative for extremity weakness.  Hematological: Negative for adenopathy.  Psychiatric/Behavioral:  Negative for confusion.      Physical Exam: Blood pressure (!) 170/84, pulse (!) 121, temperature (!) 96.8 F (36 C), resp. rate 18, weight 112 lb 4.8 oz (50.9 kg). Physical Exam Constitutional:      General: She is not in acute distress.    Appearance: She is not diaphoretic.  HENT:     Head: Normocephalic and atraumatic.     Mouth/Throat:     Pharynx: No oropharyngeal exudate.  Eyes:     General: No scleral icterus.    Pupils: Pupils are equal, round, and reactive to light.  Cardiovascular:     Rate and Rhythm: Normal rate and regular rhythm.     Heart sounds: No murmur heard.   Pulmonary:     Effort: Pulmonary effort is normal. No respiratory distress.     Breath sounds: No rales.     Comments: Severely decreased breath sound bilaterally.  Patient uses via nasal cannula oxygen Chest:     Chest wall: No tenderness.  Abdominal:     General: There is no distension.     Palpations: Abdomen is soft.     Tenderness: There is no abdominal tenderness.  Musculoskeletal:        General: Normal range of motion.     Cervical back: Normal range of  motion and neck supple.  Skin:    General: Skin is warm and dry.     Findings: No erythema.  Neurological:     Mental Status: She is alert and oriented to person, place, and time.     Cranial Nerves: No cranial nerve deficit.     Motor: No abnormal muscle tone.     Coordination: Coordination normal.  Psychiatric:        Mood and Affect: Affect normal.      No visits with results within 3 Day(s) from this visit.  Latest known visit with results is:  Appointment on 06/04/2020  Component Date Value Ref Range Status  . Ferritin 06/04/2020 20  11 - 307 ng/mL Final   Performed at [facility], [address] [place] [place], [place] [address]  . Iron 06/04/2020 98  28 - 170 ug/dL Final  . TIBC 09/32/3557 426  250 - 450 ug/dL Final  . Saturation Ratios 06/04/2020 23  10.4 - 31.8 % Final  . UIBC 06/04/2020 328  ug/dL Final   Performed at [facility], [address] [place], [place] [address]  . WBC 06/04/2020 4.8  4.0 - 10.5 K/uL Final  . RBC 06/04/2020 4.27  3.87 - 5.11 MIL/uL Final  . Hemoglobin 06/04/2020 12.3  12.0 - 15.0 g/dL Final  . HCT 54/27/0623 36.8  36.0 - 46.0 % Final  . MCV 06/04/2020 86.2  80.0 - 100.0 fL Final  . MCH 06/04/2020 28.8  26.0 - 34.0 pg Final  . MCHC 06/04/2020 33.4  30.0 - 36.0 g/dL Final  . RDW 76/28/3151 12.7  11.5 - 15.5 % Final  . Platelets 06/04/2020 233  150 - 400 K/uL Final  . nRBC 06/04/2020 0.0  0.0 - 0.2 % Final  . Neutrophils Relative % 06/04/2020 46  % Final  . Neutro Abs 06/04/2020 2.2  1.7 - 7.7 K/uL Final  . Lymphocytes Relative 06/04/2020 40  % Final  . Lymphs Abs 06/04/2020 1.9  0.7 - 4.0 K/uL Final  . Monocytes Relative 06/04/2020 11  % Final  . Monocytes Absolute 06/04/2020 0.5  0.1 - 1.0 K/uL Final  . Eosinophils Relative 06/04/2020 3  %  Final  . Eosinophils Absolute 06/04/2020 0.1  0.0 - 0.5 K/uL Final  . Basophils Relative 06/04/2020 0  % Final  . Basophils Absolute 06/04/2020 0.0  0.0 - 0.1 K/uL Final  . Immature  Granulocytes 06/04/2020 0  % Final  . Abs Immature Granulocytes 06/04/2020 0.01  0.00 - 0.07 K/uL Final   Performed at [facility], [address], [place], [place] [address]    Assessment and plan  1. Iron deficiency anemia due to chronic blood loss   2. History of partial colectomy   3. Tachycardia    #History of iron deficiency anemia. Labs are reviewed and discussed with patient. Iron panel indicates stable iron store Hemoglobin has improved to 12.3.  I will hold off additional IV Venofer at this point. She is currently not taking oral iron supplementation. Continue monitor.  She has a history of gastric ulcer and history of partial colectomy. Patient has a repeat EGD and colonoscopy in November 2021. EGD showed esophagitis, hematin in the gastric antrum.  Pathology showed reactive gastropathy with focal ulceration and active inflammation. Colonoscopy showed a diverticulosis.  Nonbleeding internal hemorrhoids.  No specimen was collected. Recommend patient to continue follow-up with gastroenterology.  Tachycardia, etiology unknown.  Recommend patient to monitor heart rate and if persistently high, contact primary care provider and cardiology  Follow  Up in 6 months.   We spent sufficient time to discuss many aspect of care, questions were answered to patient's satisfaction.  Rickard Patience, MD  10/19/2017, 5:43 PM

## 2020-06-08 NOTE — Progress Notes (Signed)
Patient denies new problems/concerns today.   °

## 2020-10-25 ENCOUNTER — Other Ambulatory Visit: Payer: Self-pay | Admitting: Internal Medicine

## 2020-11-03 ENCOUNTER — Other Ambulatory Visit: Payer: Self-pay | Admitting: Internal Medicine

## 2020-11-03 DIAGNOSIS — Z1231 Encounter for screening mammogram for malignant neoplasm of breast: Secondary | ICD-10-CM

## 2020-11-05 ENCOUNTER — Telehealth: Payer: Self-pay | Admitting: Oncology

## 2020-11-05 NOTE — Telephone Encounter (Signed)
Pt called in and wants to make an appt.Please call back at [phone]

## 2020-11-08 NOTE — Telephone Encounter (Signed)
Pt wanting to know if she needs to be seen sooner based on the labs collected at Dr. Eston Esters office.

## 2020-11-08 NOTE — Telephone Encounter (Signed)
Tanya Rhodes can you move up appts to NP/venofer this week or next. No need for labs- she had at PCP. Please notify pt of new appts.

## 2020-11-08 NOTE — Telephone Encounter (Signed)
Dr. Eston Esters labs resutls available in care everywhere. Pt scheduled for labs on 11/30 and MD/venofer on 12/5. Do appts needs to be moved up?

## 2020-11-09 ENCOUNTER — Encounter: Payer: Self-pay | Admitting: Oncology

## 2020-11-09 NOTE — Telephone Encounter (Signed)
Appointments moved up to 11/8, patient confirmed.

## 2020-11-16 ENCOUNTER — Inpatient Hospital Stay: Payer: Medicare Other | Attending: Oncology | Admitting: Nurse Practitioner

## 2020-11-16 ENCOUNTER — Encounter: Payer: Self-pay | Admitting: Nurse Practitioner

## 2020-11-16 ENCOUNTER — Other Ambulatory Visit: Payer: Self-pay

## 2020-11-16 ENCOUNTER — Inpatient Hospital Stay: Payer: Medicare Other

## 2020-11-16 VITALS — BP 144/55 | HR 102 | Temp 98.7°F | Resp 17 | Wt 114.0 lb

## 2020-11-16 DIAGNOSIS — Z79899 Other long term (current) drug therapy: Secondary | ICD-10-CM | POA: Insufficient documentation

## 2020-11-16 DIAGNOSIS — D500 Iron deficiency anemia secondary to blood loss (chronic): Secondary | ICD-10-CM

## 2020-11-16 DIAGNOSIS — J439 Emphysema, unspecified: Secondary | ICD-10-CM | POA: Diagnosis not present

## 2020-11-16 DIAGNOSIS — I10 Essential (primary) hypertension: Secondary | ICD-10-CM | POA: Diagnosis not present

## 2020-11-16 DIAGNOSIS — E785 Hyperlipidemia, unspecified: Secondary | ICD-10-CM | POA: Insufficient documentation

## 2020-11-16 DIAGNOSIS — Z87891 Personal history of nicotine dependence: Secondary | ICD-10-CM | POA: Insufficient documentation

## 2020-11-16 DIAGNOSIS — R000 Tachycardia, unspecified: Secondary | ICD-10-CM | POA: Insufficient documentation

## 2020-11-16 DIAGNOSIS — E538 Deficiency of other specified B group vitamins: Secondary | ICD-10-CM | POA: Diagnosis not present

## 2020-11-16 DIAGNOSIS — D509 Iron deficiency anemia, unspecified: Secondary | ICD-10-CM

## 2020-11-16 DIAGNOSIS — M5136 Other intervertebral disc degeneration, lumbar region: Secondary | ICD-10-CM | POA: Diagnosis not present

## 2020-11-16 LAB — CBC WITH DIFFERENTIAL/PLATELET
Abs Immature Granulocytes: 0.02 10*3/uL (ref 0.00–0.07)
Basophils Absolute: 0 10*3/uL (ref 0.0–0.1)
Basophils Relative: 1 %
Eosinophils Absolute: 0.1 10*3/uL (ref 0.0–0.5)
Eosinophils Relative: 2 %
HCT: 33.2 % — ABNORMAL LOW (ref 36.0–46.0)
Hemoglobin: 10.9 g/dL — ABNORMAL LOW (ref 12.0–15.0)
Immature Granulocytes: 0 %
Lymphocytes Relative: 29 %
Lymphs Abs: 1.8 10*3/uL (ref 0.7–4.0)
MCH: 28.6 pg (ref 26.0–34.0)
MCHC: 32.8 g/dL (ref 30.0–36.0)
MCV: 87.1 fL (ref 80.0–100.0)
Monocytes Absolute: 0.5 10*3/uL (ref 0.1–1.0)
Monocytes Relative: 9 %
Neutro Abs: 3.7 10*3/uL (ref 1.7–7.7)
Neutrophils Relative %: 59 %
Platelets: 243 10*3/uL (ref 150–400)
RBC: 3.81 MIL/uL — ABNORMAL LOW (ref 3.87–5.11)
RDW: 12.8 % (ref 11.5–15.5)
WBC: 6.2 10*3/uL (ref 4.0–10.5)
nRBC: 0 % (ref 0.0–0.2)

## 2020-11-16 LAB — COMPREHENSIVE METABOLIC PANEL
ALT: 29 U/L (ref 0–44)
AST: 27 U/L (ref 15–41)
Albumin: 4.1 g/dL (ref 3.5–5.0)
Alkaline Phosphatase: 55 U/L (ref 38–126)
Anion gap: 6 (ref 5–15)
BUN: 19 mg/dL (ref 8–23)
CO2: 30 mmol/L (ref 22–32)
Calcium: 9.4 mg/dL (ref 8.9–10.3)
Chloride: 101 mmol/L (ref 98–111)
Creatinine, Ser: 1.01 mg/dL — ABNORMAL HIGH (ref 0.44–1.00)
GFR, Estimated: 56 mL/min — ABNORMAL LOW (ref >60)
Glucose, Bld: 97 mg/dL (ref 70–99)
Potassium: 3.9 mmol/L (ref 3.5–5.1)
Sodium: 137 mmol/L (ref 135–145)
Total Bilirubin: 0.8 mg/dL (ref 0.3–1.2)
Total Protein: 6.7 g/dL (ref 6.5–8.1)

## 2020-11-16 LAB — IRON AND TIBC
Iron: 84 ug/dL (ref 28–170)
Saturation Ratios: 17 % (ref 10.4–31.8)
TIBC: 491 ug/dL — ABNORMAL HIGH (ref 250–450)
UIBC: 407 ug/dL

## 2020-11-16 LAB — FERRITIN: Ferritin: 10 ng/mL — ABNORMAL LOW (ref 11–307)

## 2020-11-16 MED ORDER — IRON SUCROSE 20 MG/ML IV SOLN
200.0000 mg | Freq: Once | INTRAVENOUS | Status: AC
  Administered 2020-11-16: 200 mg via INTRAVENOUS
  Filled 2020-11-16: qty 10

## 2020-11-16 MED ORDER — SODIUM CHLORIDE 0.9 % IV SOLN
Freq: Once | INTRAVENOUS | Status: AC
  Filled 2020-11-16: qty 250

## 2020-11-16 NOTE — Progress Notes (Signed)
[place] [facility] day:  10/19/17  Chief Complaint: Tanya Rhodes is a 80 y.o. female with a history of iron deficiency anemia present for follow-up.  PERTINENT HEMATOLOGY HISTORY Patient was previously followed by Dr. Mike Gip.   Patient establish care with me on 05/30/2018 when she had a virtual visit with me. Extensive chart review was performed by me.  Patient had a history of gastric ulcer years ago and a colonic polyp status post partial colectomy in 2013.  Colonoscopy on 05/15/2013 reviewed patent ileocolonic anastomosis and internal hemorrhoids.  EGD on 05/15/2013 was normal.  Patient has iron deficiency and has been receiving IV Venofer if ferritin is less than 30. History of B12 deficiency is taking oral B12 supplementation.   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  Smoking history greater than 30-pack-year.  CT chest angiogram 04/01/2014 shows no pulmonary embolism but a 4 mm left upper lobe pulmonary nodule.  Chest CT to 03/04/18 17 revealed a stable 3 mm lingular nodule.  There were new groundglass opacities in the right upper lobe.  Right middle lobe and right lower lobe suggesting infectious or inflammatory process. CT chest 07/30/2015 revealed a stable 3 mm left lower lobe nodule. Patient follows up with [facility] gastroenterology Dr. Vira Agar.  INTERVAL HISTORY Tanya Rhodes is a 80 y.o. female with above history of anemia who returns to clinic for follow up. She continues home oxygen. Has chronic fatigue. Otherwise she feels at baseline and denies specific complaints. No black or blood stools.     Past Medical History:  Diagnosis Date   Allergic rhinitis    Anxiety    Centrilobular emphysema (HCC)    Chronic systolic CHF (congestive heart failure) ([place])    Colon polyposis    COPD (chronic obstructive pulmonary disease) ([place])    Coronary artery disease involving native coronary artery of native heart without angina pectoris     DDD (degenerative disc disease), lumbar    Dysrhythmia    Essential hypertension    GERD (gastroesophageal reflux disease)    HLD (hyperlipidemia)    Hypertension    IDA (iron deficiency anemia)    Low serum vitamin D    Lumbar radiculitis    Lumbar spondylitis (HCC)    Lumbar stenosis with neurogenic claudication    Melena    PUD (peptic ulcer disease)    Restless leg    Rotator cuff rupture    VHD (valvular heart disease)     Past Surgical History:  Procedure Laterality Date   ABDOMINAL HYSTERECTOMY     APPENDECTOMY     BREAST BIOPSY Right 11/20/11   neg [place] core   COLECTOMY     COLONOSCOPY WITH PROPOFOL N/A 12/08/2019   Procedure: COLONOSCOPY WITH PROPOFOL;  Surgeon: Lesly Rubenstein, MD;  Location: [facility] ENDOSCOPY;  Service: Endoscopy;  Laterality: N/A;   CORONARY ANGIOPLASTY WITH STENT PLACEMENT     ESOPHAGOGASTRODUODENOSCOPY (EGD) WITH PROPOFOL N/A 12/08/2019   Procedure: ESOPHAGOGASTRODUODENOSCOPY (EGD) WITH PROPOFOL;  Surgeon: Lesly Rubenstein, MD;  Location: [facility] ENDOSCOPY;  Service: Endoscopy;  Laterality: N/A;   TONSILLECTOMY      Family History  Problem Relation Age of Onset   Heart disease Brother    Heart disease Sister     Social History:  reports that she quit smoking about 27 years ago. Her smoking use included cigarettes. She has a 78.75 pack-year smoking history. She has never used smokeless tobacco. She reports that she does not drink alcohol  and does not use drugs.  She has a greater than 30 pack year smoking history.  She quit smoking in 1995.    Allergies:  Allergies  Allergen Reactions   Aspirin Other (See Comments)    GIB   Ropinirole Anxiety and Other (See Comments)    Reaction:  Severe leg pain    Ferrous Fumarate Itching   Ferrous Sulfate Diarrhea and Nausea And Vomiting   Omeprazole Nausea And Vomiting   Oxycodone Other (See Comments)    Reaction:  Makes pt hyper    Prednisone Other (See Comments)    Reaction:  Makes pt hyper      Current Medications: Current Outpatient Medications  Medication Sig Dispense Refill   acetaminophen (TYLENOL) 500 MG tablet Take 500 mg by mouth every 6 (six) hours as needed for mild pain, moderate pain, fever or headache.      albuterol (PROVENTIL HFA;VENTOLIN HFA) 108 (90 Base) MCG/ACT inhaler Inhale 2 puffs into the lungs every 4 (four) hours as needed for wheezing or shortness of breath.     albuterol (PROVENTIL) (2.5 MG/3ML) 0.083% nebulizer solution Take 2.5 mg by nebulization every 6 (six) hours as needed for wheezing or shortness of breath.     ALPRAZolam (XANAX) 0.25 MG tablet Take 0.25 mg by mouth at bedtime as needed for anxiety or sleep.     budesonide-formoterol (SYMBICORT) 160-4.5 MCG/ACT inhaler Inhale 2 puffs into the lungs 2 (two) times daily.     calcium citrate-vitamin D (CITRACAL+D) 315-200 MG-UNIT tablet Take 1 tablet by mouth daily.     cyanocobalamin 1000 MCG tablet Take 1,000 mcg by mouth daily.     escitalopram (LEXAPRO) 10 MG tablet Take 10 mg by mouth daily.  0   fluticasone (FLONASE) 50 MCG/ACT nasal spray Place 2 sprays into both nostrils daily.     furosemide (LASIX) 20 MG tablet Take 20 mg by mouth daily.     gabapentin (NEURONTIN) 300 MG capsule Take 300 mg by mouth daily.     lisinopril (PRINIVIL,ZESTRIL) 2.5 MG tablet Take 2.5 mg by mouth daily.     metoprolol succinate (TOPROL-XL) 25 MG 24 hr tablet Take 25 mg by mouth daily.  1   simvastatin (ZOCOR) 20 MG tablet Take 20 mg by mouth at bedtime.     SPIRIVA RESPIMAT 2.5 MCG/ACT AERS Inhale 2 puffs into the lungs daily.   0   traMADol (ULTRAM) 50 MG tablet Take 75 mg by mouth 2 (two) times daily as needed for moderate pain.      Vitamin D, Ergocalciferol, (DRISDOL) 50000 units CAPS capsule Take 50,000 Units by mouth every 7 (seven) days. Pt takes on Saturday.     No current facility-administered medications for this visit.    Review of Systems  Constitutional:  Positive for fatigue. Negative for  appetite change, chills and fever.  HENT:   Negative for hearing loss and voice change.   Eyes:  Negative for eye problems.  Respiratory:  Positive for shortness of breath. Negative for chest tightness and cough.   Cardiovascular:  Negative for chest pain.  Gastrointestinal:  Negative for abdominal distention, abdominal pain and blood in stool.  Endocrine: Negative for hot flashes.  Genitourinary:  Negative for difficulty urinating and frequency.   Musculoskeletal:  Negative for arthralgias.  Skin:  Negative for itching and rash.  Neurological:  Negative for extremity weakness.  Hematological:  Negative for adenopathy.  Psychiatric/Behavioral:  Negative for confusion.      Physical Exam: Blood pressure Marland Kitchen)  144/55, pulse (!) 102, temperature 98.7 F (37.1 C), temperature source Tympanic, resp. rate 17, weight 114 lb (51.7 kg), SpO2 100 %. Physical Exam Constitutional:      General: She is not in acute distress.    Appearance: She is not diaphoretic.     Comments: Appears older than stated age.   HENT:     Head: Normocephalic and atraumatic.     Mouth/Throat:     Pharynx: No oropharyngeal exudate.  Eyes:     General: No scleral icterus. Cardiovascular:     Rate and Rhythm: Normal rate and regular rhythm.     Heart sounds: No murmur heard. Pulmonary:     Effort: Pulmonary effort is normal. No respiratory distress.     Breath sounds: No rales.     Comments: Severely decreased breath sound bilaterally.  Patient uses via nasal cannula oxygen Skin:    General: Skin is warm and dry.  Neurological:     Mental Status: She is alert and oriented to person, place, and time.     Motor: No abnormal muscle tone.  Psychiatric:        Mood and Affect: Mood and affect normal.        Behavior: Behavior normal.   CBC Latest Ref Rng & Units 11/16/2020 06/04/2020 02/06/2020  WBC 4.0 - 10.5 K/uL 6.2 4.8 4.4  Hemoglobin 12.0 - 15.0 g/dL 10.9(L) 12.3 11.6(L)  Hematocrit 36.0 - 46.0 % 33.2(L) 36.8  35.2(L)  Platelets 150 - 400 K/uL 243 233 222   CMP Latest Ref Rng & Units 11/16/2020 11/10/2019 08/23/2018  Glucose 70 - 99 mg/dL 97 89 94  BUN 8 - 23 mg/dL 19 19 19   Creatinine 0.44 - 1.00 mg/dL ) 2.83(T 5.17  Sodium 135 - 145 mmol/L 137 136 135  Potassium 3.5 - 5.1 mmol/L 3.9 3.9 3.8  Chloride 98 - 111 mmol/L 101 100 98  CO2 22 - 32 mmol/L 30 31 29   Calcium 8.9 - 10.3 mg/dL 9.4 9.1 9.7  Total Protein 6.5 - 8.1 g/dL 6.7 6.4(L) 6.4(L)  Total Bilirubin 0.3 - 1.2 mg/dL 0.8 0.7 0.5  Alkaline Phos 38 - 126 U/L 55 48 61  AST 15 - 41 U/L 27 18 18   ALT 0 - 44 U/L 29 19 21    Iron/TIBC/Ferritin/ %Sat    Component Value Date/Time   IRON 84 11/16/2020 1601   IRON 48 (L) 07/04/2013 1040   TIBC 491 (H) 11/16/2020 1601   TIBC 464 (H) 07/04/2013 1040   FERRITIN 10 (L) 11/16/2020 1601   IRONPCTSAT 17 11/16/2020 1601   IRONPCTSAT 10 07/04/2013 1040    Assessment and plan  No diagnosis found.  Iron Deficiency Anemia- hx of gastric ulcer and partial colectomy. Repeat EGD and colonoscopy in November 2021. EGD showed esophagitis, hematin in gastric antrum. Pathology showed reactive gastropathy with focal ulceration and active inflammation. Colonoscopy showed a diverticulosis. Nonbleeding internal hemorrhoids. No specimin was collected. Hemoglobin 10.9 (previously 12.3). Normocytic. Ferritin 10 (previously 20). Iron saturation 17% (previously 23%). Labs consistent with iron deficiency. Venofer x 5.  Tachycardia- persistently elevated heart rate. Continue follow upw ith cardiology.   RTC:  Venofer x 5 3 months- labs (cbc, cmp, ferritin, iron & tibc) then few days later see Dr 13/08/2020 and possible venofer  We spent sufficient time to discuss many aspect of care, questions were answered to patient's satisfaction.  13/08/2020, NP

## 2020-11-16 NOTE — Patient Instructions (Signed)

## 2020-11-16 NOTE — Progress Notes (Signed)
Patient here for oncology follow-up appointment,  concerns chronic SOB and leg pain

## 2020-11-22 ENCOUNTER — Encounter: Payer: Self-pay | Admitting: Oncology

## 2020-11-25 ENCOUNTER — Inpatient Hospital Stay: Payer: Medicare Other

## 2020-11-25 ENCOUNTER — Other Ambulatory Visit: Payer: Self-pay

## 2020-11-25 VITALS — BP 147/48 | HR 75 | Temp 96.3°F | Resp 20

## 2020-11-25 DIAGNOSIS — D509 Iron deficiency anemia, unspecified: Secondary | ICD-10-CM | POA: Diagnosis not present

## 2020-11-25 MED ORDER — IRON SUCROSE 20 MG/ML IV SOLN
200.0000 mg | Freq: Once | INTRAVENOUS | Status: AC
  Administered 2020-11-25: 15:00:00 200 mg via INTRAVENOUS
  Filled 2020-11-25: qty 10

## 2020-11-25 MED ORDER — SODIUM CHLORIDE 0.9 % IV SOLN
Freq: Once | INTRAVENOUS | Status: AC
  Filled 2020-11-25: qty 250

## 2020-11-25 NOTE — Patient Instructions (Signed)

## 2020-12-01 ENCOUNTER — Inpatient Hospital Stay: Payer: Medicare Other

## 2020-12-01 ENCOUNTER — Other Ambulatory Visit: Payer: Self-pay

## 2020-12-01 VITALS — BP 129/55 | HR 97 | Temp 98.1°F | Resp 20

## 2020-12-01 DIAGNOSIS — D509 Iron deficiency anemia, unspecified: Secondary | ICD-10-CM

## 2020-12-01 MED ORDER — IRON SUCROSE 20 MG/ML IV SOLN
200.0000 mg | Freq: Once | INTRAVENOUS | Status: AC
  Administered 2020-12-01: 200 mg via INTRAVENOUS
  Filled 2020-12-01: qty 10

## 2020-12-01 MED ORDER — SODIUM CHLORIDE 0.9 % IV SOLN
Freq: Once | INTRAVENOUS | Status: AC
  Filled 2020-12-01: qty 250

## 2020-12-08 ENCOUNTER — Other Ambulatory Visit: Payer: Medicare HMO

## 2020-12-09 ENCOUNTER — Other Ambulatory Visit: Payer: Self-pay

## 2020-12-09 ENCOUNTER — Inpatient Hospital Stay: Payer: Medicare Other | Attending: Oncology

## 2020-12-09 VITALS — BP 128/63 | HR 89 | Temp 98.1°F | Resp 16

## 2020-12-09 DIAGNOSIS — Z79899 Other long term (current) drug therapy: Secondary | ICD-10-CM | POA: Insufficient documentation

## 2020-12-09 DIAGNOSIS — D509 Iron deficiency anemia, unspecified: Secondary | ICD-10-CM | POA: Diagnosis present

## 2020-12-09 MED ORDER — IRON SUCROSE 20 MG/ML IV SOLN
200.0000 mg | Freq: Once | INTRAVENOUS | Status: AC
  Administered 2020-12-09: 200 mg via INTRAVENOUS
  Filled 2020-12-09: qty 10

## 2020-12-09 MED ORDER — SODIUM CHLORIDE 0.9 % IV SOLN
INTRAVENOUS | Status: DC
  Filled 2020-12-09: qty 250

## 2020-12-13 ENCOUNTER — Ambulatory Visit: Payer: Medicare HMO

## 2020-12-13 ENCOUNTER — Ambulatory Visit: Payer: Medicare HMO | Admitting: Oncology

## 2020-12-16 ENCOUNTER — Inpatient Hospital Stay: Payer: Medicare Other

## 2020-12-16 ENCOUNTER — Other Ambulatory Visit: Payer: Self-pay

## 2020-12-16 VITALS — BP 121/61 | HR 78 | Temp 96.9°F | Resp 18

## 2020-12-16 DIAGNOSIS — D509 Iron deficiency anemia, unspecified: Secondary | ICD-10-CM

## 2020-12-16 MED ORDER — SODIUM CHLORIDE 0.9 % IV SOLN
Freq: Once | INTRAVENOUS | Status: AC
  Filled 2020-12-16: qty 250

## 2020-12-16 MED ORDER — IRON SUCROSE 20 MG/ML IV SOLN
200.0000 mg | Freq: Once | INTRAVENOUS | Status: AC
  Administered 2020-12-16: 200 mg via INTRAVENOUS
  Filled 2020-12-16: qty 10

## 2020-12-16 NOTE — Patient Instructions (Signed)
[facility]   ?Discharge Instructions: ?Thank you for choosing [facility] to provide your oncology and hematology care.  ?If you have a lab appointment with the Cancer Center, please go directly to the Cancer Center and check in at the registration area. ?  ?We strive to give you quality time with your provider. You may need to reschedule your appointment if you arrive late (15 or more minutes).  Arriving late affects you and other patients whose appointments are after yours.  Also, if you miss three or more appointments without notifying the office, you may be dismissed from the clinic at the provider?s discretion.    ?  ?For prescription refill requests, have your pharmacy contact our office and allow 72 hours for refills to be completed.   ? ?Today you received the following: Venofer.    ?  ?BELOW ARE SYMPTOMS THAT SHOULD BE REPORTED IMMEDIATELY: ?*FEVER GREATER THAN 100.4 F (38 ?C) OR HIGHER ?*CHILLS OR SWEATING ?*NAUSEA AND VOMITING THAT IS NOT CONTROLLED WITH YOUR NAUSEA MEDICATION ?*UNUSUAL SHORTNESS OF BREATH ?*UNUSUAL BRUISING OR BLEEDING ?*URINARY PROBLEMS (pain or burning when urinating, or frequent urination) ?*BOWEL PROBLEMS (unusual diarrhea, constipation, pain near the anus) ?TENDERNESS IN MOUTH AND THROAT WITH OR WITHOUT PRESENCE OF ULCERS (sore throat, sores in mouth, or a toothache) ?UNUSUAL RASH, SWELLING OR PAIN  ?UNUSUAL VAGINAL DISCHARGE OR ITCHING  ? ?Items with * indicate a potential emergency and should be followed up as soon as possible or go to the Emergency Department if any problems should occur. ? ?Should you have questions after your visit or need to cancel or reschedule your appointment, please contact [facility]  Dept: [phone]  and follow the prompts.  Office hours are 8:00 a.m. to 4:30 p.m. Monday - Friday. Please note that voicemails left after 4:00 p.m. may not be returned until the following  business day.  We are closed weekends and major holidays. You have access to a nurse at all times for urgent questions. Please call the main number to the clinic Dept: [phone] and follow the prompts. ? ?For any non-urgent questions, you may also contact your provider using MyChart. We now offer e-Visits for anyone 18 and older to request care online for non-urgent symptoms. For details visit [email or web address]. ?  ?Also download the MyChart app! Go to the app store, search "MyChart", open the app, select [facility], and log in with your MyChart username and password. ? ?Due to Covid, a mask is required upon entering the hospital/clinic. If you do not have a mask, one will be given to you upon arrival. For doctor visits, patients may have 1 support person aged 18 or older with them. For treatment visits, patients cannot have anyone with them due to current Covid guidelines and our immunocompromised population.  ?

## 2020-12-20 ENCOUNTER — Ambulatory Visit: Payer: Medicare HMO | Admitting: Nurse Practitioner

## 2020-12-20 ENCOUNTER — Ambulatory Visit: Payer: Medicare HMO

## 2021-02-08 ENCOUNTER — Other Ambulatory Visit: Payer: Self-pay | Admitting: *Deleted

## 2021-02-08 DIAGNOSIS — D509 Iron deficiency anemia, unspecified: Secondary | ICD-10-CM

## 2021-02-15 ENCOUNTER — Inpatient Hospital Stay: Payer: Medicare Other | Attending: Oncology

## 2021-02-15 ENCOUNTER — Other Ambulatory Visit: Payer: Self-pay

## 2021-02-15 DIAGNOSIS — D509 Iron deficiency anemia, unspecified: Secondary | ICD-10-CM | POA: Diagnosis not present

## 2021-02-15 LAB — COMPREHENSIVE METABOLIC PANEL
ALT: 27 U/L (ref 0–44)
AST: 21 U/L (ref 15–41)
Albumin: 3.8 g/dL (ref 3.5–5.0)
Alkaline Phosphatase: 59 U/L (ref 38–126)
Anion gap: 8 (ref 5–15)
BUN: 20 mg/dL (ref 8–23)
CO2: 26 mmol/L (ref 22–32)
Calcium: 9 mg/dL (ref 8.9–10.3)
Chloride: 101 mmol/L (ref 98–111)
Creatinine, Ser: 0.88 mg/dL (ref 0.44–1.00)
GFR, Estimated: >60 mL/min (ref >60)
Glucose, Bld: 106 mg/dL — ABNORMAL HIGH (ref 70–99)
Potassium: 3.8 mmol/L (ref 3.5–5.1)
Sodium: 135 mmol/L (ref 135–145)
Total Bilirubin: <0.1 mg/dL — ABNORMAL LOW (ref 0.3–1.2)
Total Protein: 6.5 g/dL (ref 6.5–8.1)

## 2021-02-15 LAB — CBC WITH DIFFERENTIAL/PLATELET
Abs Immature Granulocytes: 0.01 10*3/uL (ref 0.00–0.07)
Basophils Absolute: 0 10*3/uL (ref 0.0–0.1)
Basophils Relative: 1 %
Eosinophils Absolute: 0.1 10*3/uL (ref 0.0–0.5)
Eosinophils Relative: 3 %
HCT: 34.8 % — ABNORMAL LOW (ref 36.0–46.0)
Hemoglobin: 11.6 g/dL — ABNORMAL LOW (ref 12.0–15.0)
Immature Granulocytes: 0 %
Lymphocytes Relative: 44 %
Lymphs Abs: 1.9 10*3/uL (ref 0.7–4.0)
MCH: 30.1 pg (ref 26.0–34.0)
MCHC: 33.3 g/dL (ref 30.0–36.0)
MCV: 90.2 fL (ref 80.0–100.0)
Monocytes Absolute: 0.4 10*3/uL (ref 0.1–1.0)
Monocytes Relative: 9 %
Neutro Abs: 1.9 10*3/uL (ref 1.7–7.7)
Neutrophils Relative %: 43 %
Platelets: 243 10*3/uL (ref 150–400)
RBC: 3.86 MIL/uL — ABNORMAL LOW (ref 3.87–5.11)
RDW: 14.2 % (ref 11.5–15.5)
WBC: 4.3 10*3/uL (ref 4.0–10.5)
nRBC: 0 % (ref 0.0–0.2)

## 2021-02-15 LAB — IRON AND TIBC
Iron: 98 ug/dL (ref 28–170)
Saturation Ratios: 30 % (ref 10.4–31.8)
TIBC: 326 ug/dL (ref 250–450)
UIBC: 228 ug/dL

## 2021-02-15 LAB — FERRITIN: Ferritin: 134 ng/mL (ref 11–307)

## 2021-02-17 ENCOUNTER — Inpatient Hospital Stay: Payer: Medicare Other | Admitting: Oncology

## 2021-02-17 ENCOUNTER — Inpatient Hospital Stay: Payer: Medicare Other

## 2021-02-18 ENCOUNTER — Inpatient Hospital Stay: Payer: Medicare Other | Admitting: Nurse Practitioner

## 2021-02-18 ENCOUNTER — Inpatient Hospital Stay: Payer: Medicare Other

## 2021-02-22 ENCOUNTER — Inpatient Hospital Stay: Payer: Medicare Other

## 2021-02-22 ENCOUNTER — Inpatient Hospital Stay (HOSPITAL_BASED_OUTPATIENT_CLINIC_OR_DEPARTMENT_OTHER): Payer: Medicare Other | Admitting: Oncology

## 2021-02-22 ENCOUNTER — Inpatient Hospital Stay: Payer: Medicare Other | Admitting: Nurse Practitioner

## 2021-02-22 ENCOUNTER — Other Ambulatory Visit: Payer: Self-pay

## 2021-02-22 DIAGNOSIS — D509 Iron deficiency anemia, unspecified: Secondary | ICD-10-CM

## 2021-02-22 NOTE — Progress Notes (Signed)
[facility] day:  10/19/17  Chief Complaint: Tanya Rhodes is a 81 y.o. female with a history of iron deficiency anemia present for follow-up.  PERTINENT HEMATOLOGY HISTORY Patient was previously followed by Dr. Mike Gip.   Patient establish care with me on 05/30/2018 when she had a virtual visit with me. Extensive chart review was performed by me.  Patient had a history of gastric ulcer years ago and a colonic polyp status post partial colectomy in 2013.  Colonoscopy on 05/15/2013 reviewed patent ileocolonic anastomosis and internal hemorrhoids.  EGD on 05/15/2013 was normal.  Patient has iron deficiency and has been receiving IV Venofer if ferritin is less than 30. History of B12 deficiency is taking oral B12 supplementation.   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  Smoking history greater than 30-pack-year.  CT chest angiogram 04/01/2014 shows no pulmonary embolism but a 4 mm left upper lobe pulmonary nodule.  Chest CT to 03/04/18 17 revealed a stable 3 mm lingular nodule.  There were new groundglass opacities in the right upper lobe.  Right middle lobe and right lower lobe suggesting infectious or inflammatory process. CT chest 07/30/2015 revealed a stable 3 mm left lower lobe nodule. Patient follows up with [facility] gastro[facility]ology Dr. Vira Agar.   INTERVAL HISTORY Tanya Rhodes is an 81 year old female with past medical history significant for COPD, B12 and iron deficiency who is followed by hematology and receives intermittent IV iron infusions.  Received 5 IV Venofer infusions between 11/16/2020 - 12/16/2020.  Had a upper and lower endoscopy on 12/08/2019 that showed esophagitis, white lesion and esophageal mucosa, internal nonbleeding hemorrhoids, diverticulosis in sigmoid colon and patent end-to-side ileocolonic anastomosis.  She is on Protonix.   Since her last infusion, she appears to be stable.  Has chronic stable fatigue.  Continues  home oxygen.  No evidence of bleeding.   Past Medical History:  Diagnosis Date   Allergic rhinitis    Anxiety    Centrilobular emphysema (HCC)    Chronic systolic CHF (congestive heart failure) ([place])    Colon polyposis    COPD (chronic obstructive pulmonary disease) ([place])    Coronary artery disease involving native coronary artery of native heart without angina pectoris    DDD (degenerative disc disease), lumbar    Dysrhythmia    Essential hypertension    GERD (gastroesophageal reflux disease)    HLD (hyperlipidemia)    Hypertension    IDA (iron deficiency anemia)    Low serum vitamin D    Lumbar radiculitis    Lumbar spondylitis (HCC)    Lumbar stenosis with neurogenic claudication    Melena    PUD (peptic ulcer disease)    Restless leg    Rotator cuff rupture    VHD (valvular heart disease)     Past Surgical History:  Procedure Laterality Date   ABDOMINAL HYSTERECTOMY     APPENDECTOMY     BREAST BIOPSY Right 11/20/11   neg [place] core   COLECTOMY     COLONOSCOPY WITH PROPOFOL N/A 12/08/2019   Procedure: COLONOSCOPY WITH PROPOFOL;  Surgeon: Lesly Rubenstein, MD;  Location: [facility] ENDOSCOPY;  Service: Endoscopy;  Laterality: N/A;   CORONARY ANGIOPLASTY WITH STENT PLACEMENT     ESOPHAGOGASTRODUODENOSCOPY (EGD) WITH PROPOFOL N/A 12/08/2019   Procedure: ESOPHAGOGASTRODUODENOSCOPY (EGD) WITH PROPOFOL;  Surgeon: Lesly Rubenstein, MD;  Location: [facility] ENDOSCOPY;  Service: Endoscopy;  Laterality: N/A;   TONSILLECTOMY      Family History  Problem Relation Age  of Onset   Heart disease Brother    Heart disease Sister     Social History:  reports that she quit smoking about 27 years ago. Her smoking use included cigarettes. She has a 78.75 pack-year smoking history. She has never used smokeless tobacco. She reports that she does not drink alcohol and does not use drugs.  She has a greater than 30 pack year smoking history.  She quit smoking in 1995.    Allergies:  Allergies   Allergen Reactions   Aspirin Other (See Comments)    GIB   Ropinirole Anxiety and Other (See Comments)    Reaction:  Severe leg pain    Ferrous Fumarate Itching   Ferrous Sulfate Diarrhea and Nausea And Vomiting   Omeprazole Nausea And Vomiting   Oxycodone Other (See Comments)    Reaction:  Makes pt hyper    Prednisone Other (See Comments)    Reaction:  Makes pt hyper     Current Medications: Current Outpatient Medications  Medication Sig Dispense Refill   acetaminophen (TYLENOL) 500 MG tablet Take 500 mg by mouth every 6 (six) hours as needed for mild pain, moderate pain, fever or headache.      albuterol (PROVENTIL HFA;VENTOLIN HFA) 108 (90 Base) MCG/ACT inhaler Inhale 2 puffs into the lungs every 4 (four) hours as needed for wheezing or shortness of breath.     albuterol (PROVENTIL) (2.5 MG/3ML) 0.083% nebulizer solution Take 2.5 mg by nebulization every 6 (six) hours as needed for wheezing or shortness of breath.     ALPRAZolam (XANAX) 0.25 MG tablet Take 0.25 mg by mouth at bedtime as needed for anxiety or sleep.     budesonide-formoterol (SYMBICORT) 160-4.5 MCG/ACT inhaler Inhale 2 puffs into the lungs 2 (two) times daily.     calcium citrate-vitamin D (CITRACAL+D) 315-200 MG-UNIT tablet Take 1 tablet by mouth daily.     cyanocobalamin 1000 MCG tablet Take 1,000 mcg by mouth daily.     escitalopram (LEXAPRO) 10 MG tablet Take 10 mg by mouth daily.  0   fluticasone (FLONASE) 50 MCG/ACT nasal spray Place 2 sprays into both nostrils daily.     furosemide (LASIX) 20 MG tablet Take 20 mg by mouth daily.     gabapentin (NEURONTIN) 300 MG capsule Take 300 mg by mouth daily.     lisinopril (PRINIVIL,ZESTRIL) 2.5 MG tablet Take 2.5 mg by mouth daily.     metoprolol succinate (TOPROL-XL) 25 MG 24 hr tablet Take 25 mg by mouth daily.  1   simvastatin (ZOCOR) 20 MG tablet Take 20 mg by mouth at bedtime.     SPIRIVA RESPIMAT 2.5 MCG/ACT AERS Inhale 2 puffs into the lungs daily.   0    traMADol (ULTRAM) 50 MG tablet Take 75 mg by mouth 2 (two) times daily as needed for moderate pain.      Vitamin D, Ergocalciferol, (DRISDOL) 50000 units CAPS capsule Take 50,000 Units by mouth every 7 (seven) days. Pt takes on Saturday.     No current facility-administered medications for this visit.    Review of Systems  Constitutional:  Negative for appetite change, fatigue, fever and unexpected weight change.  HENT:   Negative for nosebleeds, sore throat and trouble swallowing.   Eyes: Negative.   Respiratory:  Positive for shortness of breath. Negative for cough and wheezing.   Cardiovascular: Negative.  Negative for chest pain and leg swelling.  Gastrointestinal:  Negative for abdominal pain, blood in stool, constipation, diarrhea, nausea and vomiting.  Endocrine: Negative.   Genitourinary: Negative.  Negative for bladder incontinence, hematuria and nocturia.   Musculoskeletal: Negative.  Negative for back pain and flank pain.  Skin: Negative.   Neurological: Negative.  Negative for dizziness, headaches, light-headedness and numbness.  Hematological: Negative.   Psychiatric/Behavioral: Negative.  Negative for confusion. The patient is not nervous/anxious.      Physical Exam: There were no vitals taken for this visit. Physical Exam Constitutional:      Appearance: Normal appearance.  HENT:     Head: Normocephalic and atraumatic.  Eyes:     Pupils: Pupils are equal, round, and reactive to light.  Cardiovascular:     Rate and Rhythm: Normal rate and regular rhythm.     Heart sounds: Normal heart sounds. No murmur heard. Pulmonary:     Effort: Pulmonary effort is normal.     Breath sounds: Normal breath sounds. No wheezing.  Abdominal:     General: Bowel sounds are normal. There is no distension.     Palpations: Abdomen is soft.     Tenderness: There is no abdominal tenderness.  Musculoskeletal:        General: Normal range of motion.     Cervical back: Normal range of  motion.  Skin:    General: Skin is warm and dry.     Findings: No rash.  Neurological:     Mental Status: She is alert and oriented to person, place, and time.     Gait: Gait is intact.  Psychiatric:        Mood and Affect: Mood and affect normal.        Cognition and Memory: Memory normal.        Judgment: Judgment normal.   CBC Latest Ref Rng & Units 02/15/2021 11/16/2020 06/04/2020  WBC 4.0 - 10.5 K/uL 4.3 6.2 4.8  Hemoglobin 12.0 - 15.0 g/dL 11.6(L) 10.9(L) 12.3  Hematocrit 36.0 - 46.0 % 34.8(L) 33.2(L) 36.8  Platelets 150 - 400 K/uL 243 243 233   CMP Latest Ref Rng & Units 02/15/2021 11/16/2020 11/10/2019  Glucose 70 - 99 mg/dL 106(H) 97 89  BUN 8 - 23 mg/dL 20 19 19   Creatinine 0.44 - 1.00 mg/dL 0.88 1.01(H) 0.85  Sodium 135 - 145 mmol/L 135 137 136  Potassium 3.5 - 5.1 mmol/L 3.8 3.9 3.9  Chloride 98 - 111 mmol/L 101 101 100  CO2 22 - 32 mmol/L 26 30 31   Calcium 8.9 - 10.3 mg/dL 9.0 9.4 9.1  Total Protein 6.5 - 8.1 g/dL 6.5 6.7 6.4(L)  Total Bilirubin 0.3 - 1.2 mg/dL <0.1(L) 0.8 0.7  Alkaline Phos 38 - 126 U/L 59 55 48  AST 15 - 41 U/L 21 27 18   ALT 0 - 44 U/L 27 29 19    Iron/TIBC/Ferritin/ %Sat    Component Value Date/Time   IRON 98 02/15/2021 1404   IRON 48 (L) 07/04/2013 1040   TIBC 326 02/15/2021 1404   TIBC 464 (H) 07/04/2013 1040   FERRITIN 134 02/15/2021 1404   IRONPCTSAT 30 02/15/2021 1404   IRONPCTSAT 10 07/04/2013 1040    Assessment and plan-Tanya Rhodes is an 81 year old female who is here for follow-up for iron deficiency anemia.  Receives intermittent IV Venofer. No diagnosis found.  Clinically she is stable.  Labs from 02/15/2021 show hemoglobin 11.6, ferritin 134, iron saturations 30% and TIBC 326.  This is an improvement from previous.  Denies any active bleeding at this time.  No additional IV iron needed today.  If she  develops any symptoms that are concerning for worsening anemia, patient is to call clinic.   Disposition-  Return to clinic in 4  months for follow-up with labs a few days before and to see Dr. Tasia Catchings with possible IV Venofer.  I spent 25 minutes dedicated to the care of this patient (face-to-face and non-face-to-face) on the date of the encounter to include what is described in the assessment and plan.  Jacquelin Hawking, NP

## 2021-06-17 ENCOUNTER — Other Ambulatory Visit: Payer: Self-pay

## 2021-06-17 DIAGNOSIS — D509 Iron deficiency anemia, unspecified: Secondary | ICD-10-CM

## 2021-06-20 ENCOUNTER — Inpatient Hospital Stay: Payer: Medicare Other | Attending: Oncology

## 2021-06-22 ENCOUNTER — Inpatient Hospital Stay: Payer: Medicare Other | Admitting: Oncology

## 2021-06-22 ENCOUNTER — Inpatient Hospital Stay: Payer: Medicare Other

## 2021-06-22 ENCOUNTER — Telehealth: Payer: Self-pay | Admitting: Oncology

## 2021-06-22 ENCOUNTER — Encounter: Payer: Self-pay | Admitting: Oncology

## 2021-06-22 NOTE — Telephone Encounter (Signed)
2 attempts made to contact pt, recieved busy signal both times.Will send mail reminder to r/s appt

## 2021-06-24 ENCOUNTER — Other Ambulatory Visit: Payer: Self-pay

## 2021-06-24 ENCOUNTER — Emergency Department: Payer: Medicare Other

## 2021-06-24 ENCOUNTER — Emergency Department
Admission: EM | Admit: 2021-06-24 | Discharge: 2021-06-24 | Disposition: A | Discharge status: Home / Self Care | Payer: Medicare Other | Attending: Emergency Medicine | Admitting: Emergency Medicine

## 2021-06-24 DIAGNOSIS — S40011A Contusion of right shoulder, initial encounter: Secondary | ICD-10-CM

## 2021-06-24 DIAGNOSIS — S0990XA Unspecified injury of head, initial encounter: Secondary | ICD-10-CM | POA: Diagnosis present

## 2021-06-24 DIAGNOSIS — S0003XA Contusion of scalp, initial encounter: Secondary | ICD-10-CM

## 2021-06-24 DIAGNOSIS — S20211A Contusion of right front wall of thorax, initial encounter: Secondary | ICD-10-CM | POA: Diagnosis not present

## 2021-06-24 DIAGNOSIS — W06XXXA Fall from bed, initial encounter: Secondary | ICD-10-CM | POA: Diagnosis not present

## 2021-06-24 DIAGNOSIS — W19XXXA Unspecified fall, initial encounter: Secondary | ICD-10-CM

## 2021-06-24 MED ORDER — PREDNISONE 50 MG PO TABS
50.0000 mg | ORAL_TABLET | Freq: Every day | ORAL | 0 refills | Status: DC
Start: 2021-06-24 — End: 2021-07-18

## 2021-06-24 NOTE — ED Notes (Signed)
Pt's neighbor [phone] Lanae Crumbly will pick patient up when ready for discharge.

## 2021-06-24 NOTE — ED Notes (Signed)
Pt was discharged by Danelle Earthly, RN

## 2021-06-24 NOTE — ED Provider Notes (Signed)
[facility] Provider Note  Patient Contact: 5:13 PM (approximate)   History   Fall   HPI  Tanya Rhodes is a 81 y.o. female who presents to the emergency department after a fall out of bed.  Patient more out of bed accidentally, did hit her head on the nightstand and landed on her right chest/right shoulder region.  No loss of consciousness initially or subsequently.  Patient denies any significant headache or visual changes.  She is primarily complaining of right shoulder and right rib pain.  No shortness of breath or substernal chest pain.  No abdominal complaints.  She denies any lower extremity pain to include hip, knee or ankle pain.  No medications prior to arrival.  No other complaints.     Physical Exam   Triage Vital Signs: ED Triage Vitals  Enc Vitals Group     BP 06/24/21 1543 (!) 159/68     Pulse Rate 06/24/21 1543 76     Resp 06/24/21 1543 18     Temp 06/24/21 1543 99 F (37.2 C)     Temp Source 06/24/21 1543 Oral     SpO2 06/24/21 1543 99 %     Weight 06/24/21 1545 114 lb (51.7 kg)     Height 06/24/21 1545 5' (1.524 m)     Head Circumference --      Peak Flow --      Pain Score 06/24/21 1545 10     Pain Loc --      Pain Edu? --      Excl. in GC? --     Most recent vital signs: Vitals:   06/24/21 1543  BP: (!) 159/68  Pulse: 76  Resp: 18  Temp: 99 F (37.2 C)  SpO2: 99%     General: Alert and in no acute distress. Eyes:  PERRL. EOMI. Head: No acute traumatic findings.  No open wounds, areas of edema noted.  Patient is tender to palpation in the right occipital skull region.  No palpable abnormality or crepitus.  No battle signs, raccoon eyes, serosanguineous fluid drainage from the ears or nares.  Neck: No stridor. No cervical spine tenderness to palpation.  Cardiovascular:  Good peripheral perfusion Respiratory: Normal respiratory effort without tachypnea or retractions. Lungs CTAB. Good air entry to the bases with no  decreased or absent breath sounds Gastrointestinal: Bowel sounds 4 quadrants. Soft and nontender to palpation. No guarding or rigidity. No palpable masses. No distention.  Musculoskeletal: Full range of motion to all extremities.  Palpation along the shoulder reveals tenderness along the anterior aspect of the shoulder.  This extends into the anterior chest wall and down the right lateral ribs.  There is no palpable abnormality.  Limited range of motion due to pain.  No open wounds are identified.  Radial pulses sensation intact in the upper extremity.  Along the ribs there is no paradoxical chest wall movement.  Underlying breath sounds bilaterally.  No crepitus or subcutaneous emphysema appreciated.  Patient does have ecchymosis along the anterior chest wall. Neurologic:  No gross focal neurologic deficits are appreciated.  Cranial nerves II through XII grossly intact. Skin:   No rash noted Other:   ED Results / Procedures / Treatments   Labs (all labs ordered are listed, but only abnormal results are displayed) Labs Reviewed - No data to display   EKG     RADIOLOGY  I personally viewed, evaluated, and interpreted these images as part of my medical decision  making, as well as reviewing the written report by the radiologist.  ED Provider Interpretation: No acute traumatic finding of the CT of the head, cervical spine, right shoulder or chest x-ray with rib series.  DG Ribs Unilateral W/Chest Right  Result Date: 06/24/2021 CLINICAL DATA:  Right rib pain EXAM: RIGHT RIBS AND CHEST - 3 VIEW COMPARISON:  Chest x-ray dated July 26, 2015 FINDINGS: No fracture or other bone lesions are seen involving the ribs. Dextrocurvature of the thoracic spine. There is no evidence of pneumothorax or pleural effusion. Both lungs are clear. Heart size and mediastinal contours are within normal limits for technique. IMPRESSION: No evidence of displaced rib fracture. Electronically Signed   By: Allegra Lai M.D.   On: 06/24/2021 17:52   DG Shoulder Right  Result Date: 06/24/2021 CLINICAL DATA:  fall EXAM: RIGHT SHOULDER - 2+ VIEW COMPARISON:  None Available. FINDINGS: There is no evidence of fracture or dislocation. Degenerative changes of the humeral head. No aggressive appearing focal bone abnormality. Soft tissues are unremarkable. IMPRESSION: Negative. Electronically Signed   By: Tish Frederickson M.D.   On: 06/24/2021 16:27   CT HEAD WO CONTRAST ( )  Result Date: 06/24/2021 CLINICAL DATA:  Head trauma, minor (Age >= 65y); Neck trauma (Age >= 65y) EXAM: CT HEAD WITHOUT CONTRAST CT CERVICAL SPINE WITHOUT CONTRAST TECHNIQUE: Multidetector CT imaging of the head and cervical spine was performed following the standard protocol without intravenous contrast. Multiplanar CT image reconstructions of the cervical spine were also generated. RADIATION DOSE REDUCTION: This exam was performed according to the departmental dose-optimization program which includes automated exposure control, adjustment of the mA and/or kV according to patient size and/or use of iterative reconstruction technique. COMPARISON:  None Available. FINDINGS: CT HEAD FINDINGS Brain: No evidence of acute infarction, hemorrhage, hydrocephalus, extra-axial collection or mass lesion/mass effect. Vascular: No hyperdense vessel identified. Skull: No acute fracture. Sinuses/Orbits: No acute findings. Other: No mastoid effusions. CT CERVICAL SPINE FINDINGS Alignment: Straightening. Slight degenerative anterolisthesis of C4 on C5. Otherwise, no substantial sagittal subluxation. Skull base and vertebrae: No acute fracture. Vertebral body heights are maintained. Soft tissues and spinal canal: No prevertebral fluid or swelling. No visible canal hematoma. Disc levels: Moderate to severe multilevel degenerative disc disease with disc height loss and endplate spurring. Multilevel facet and uncovertebral hypertrophy. Upper chest: Visualized lung  apices are clear. Other: Subcentimeter thyroid nodule, which does not require further imaging follow-up (ref: J Am Coll Radiol. 2015 Feb;12(2): 143-50). IMPRESSION: 1. No evidence of acute intracranial abnormality. 2. No evidence of acute fracture or traumatic malalignment in the cervical spine. Electronically Signed   By: Feliberto Harts M.D.   On: 06/24/2021 16:16   CT Cervical Spine Wo Contrast  Result Date: 06/24/2021 CLINICAL DATA:  Head trauma, minor (Age >= 65y); Neck trauma (Age >= 65y) EXAM: CT HEAD WITHOUT CONTRAST CT CERVICAL SPINE WITHOUT CONTRAST TECHNIQUE: Multidetector CT imaging of the head and cervical spine was performed following the standard protocol without intravenous contrast. Multiplanar CT image reconstructions of the cervical spine were also generated. RADIATION DOSE REDUCTION: This exam was performed according to the departmental dose-optimization program which includes automated exposure control, adjustment of the mA and/or kV according to patient size and/or use of iterative reconstruction technique. COMPARISON:  None Available. FINDINGS: CT HEAD FINDINGS Brain: No evidence of acute infarction, hemorrhage, hydrocephalus, extra-axial collection or mass lesion/mass effect. Vascular: No hyperdense vessel identified. Skull: No acute fracture. Sinuses/Orbits: No acute findings. Other: No mastoid effusions. CT CERVICAL SPINE  FINDINGS Alignment: Straightening. Slight degenerative anterolisthesis of C4 on C5. Otherwise, no substantial sagittal subluxation. Skull base and vertebrae: No acute fracture. Vertebral body heights are maintained. Soft tissues and spinal canal: No prevertebral fluid or swelling. No visible canal hematoma. Disc levels: Moderate to severe multilevel degenerative disc disease with disc height loss and endplate spurring. Multilevel facet and uncovertebral hypertrophy. Upper chest: Visualized lung apices are clear. Other: Subcentimeter thyroid nodule, which does not  require further imaging follow-up (ref: J Am Coll Radiol. 2015 Feb;12(2): 143-50). IMPRESSION: 1. No evidence of acute intracranial abnormality. 2. No evidence of acute fracture or traumatic malalignment in the cervical spine. Electronically Signed   By: Feliberto Harts M.D.   On: 06/24/2021 16:16    PROCEDURES:  Critical Care performed: No  Procedures   MEDICATIONS ORDERED IN ED: Medications - No data to display   IMPRESSION / MDM / ASSESSMENT AND PLAN / ED COURSE  I reviewed the triage vital signs and the nursing notes.                              Differential diagnosis includes, but is not limited to, skull fracture, intracranial hemorrhage, concussion, minor head injury, cervical spine fracture, shoulder fracture, shoulder dislocation, rib fracture, pneumothorax  Patient's presentation is most consistent with acute presentation with potential threat to life or bodily function.   Patient's diagnosis is consistent with fall, head contusion, chest wall contusion.  Patient presents to the ED after falling out of bed.  She did hit her head but did not lose consciousness.  Neurologically intact.  Overall exam was reassuring but given the mechanism of injury patient had imaging which is reassuring with no traumatic findings.  Patient is stable for discharge at this time.  Steroid for inflammation control.  Patient is already on tramadol for chronic pain.  Continue same.  Follow-up with primary care as needed.  Return precautions discussed with the patient.. . Patient is given ED precautions to return to the ED for any worsening or new symptoms.        FINAL CLINICAL IMPRESSION(S) / ED DIAGNOSES   Final diagnoses:  Fall, initial encounter  Contusion of scalp, initial encounter  Contusion of right shoulder, initial encounter  Contusion of right chest wall, initial encounter     Rx / DC Orders   ED Discharge Orders          Ordered    predniSONE (DELTASONE) 50 MG tablet   Daily with breakfast        06/24/21 1817             Note:  This document was prepared using Dragon voice recognition software and may include unintentional dictation errors.   Racheal Patches, PA-C 06/24/21 1819    Gilles Chiquito, MD 06/24/21 Ebony Cargo

## 2021-06-24 NOTE — ED Triage Notes (Signed)
Pt states she fell out of bed and hit her head- pt is having pain in her head and in her R shoulder- pt denies taking any blood thinners- pt denies LOC- pt on chronic O2

## 2021-07-07 ENCOUNTER — Encounter: Payer: Self-pay | Admitting: Oncology

## 2021-07-07 ENCOUNTER — Telehealth: Payer: Self-pay | Admitting: Oncology

## 2021-07-07 NOTE — Telephone Encounter (Signed)
Spoke with pt, pt states that she had a fall as to why she missed appt, she has been scheduled for lab,MD, Inf..KJ

## 2021-07-15 ENCOUNTER — Inpatient Hospital Stay: Payer: Medicare Other | Attending: Oncology

## 2021-07-15 DIAGNOSIS — Z87891 Personal history of nicotine dependence: Secondary | ICD-10-CM | POA: Diagnosis not present

## 2021-07-15 DIAGNOSIS — J432 Centrilobular emphysema: Secondary | ICD-10-CM | POA: Diagnosis not present

## 2021-07-15 DIAGNOSIS — K2100 Gastro-esophageal reflux disease with esophagitis, without bleeding: Secondary | ICD-10-CM | POA: Insufficient documentation

## 2021-07-15 DIAGNOSIS — I2510 Atherosclerotic heart disease of native coronary artery without angina pectoris: Secondary | ICD-10-CM | POA: Diagnosis not present

## 2021-07-15 DIAGNOSIS — R918 Other nonspecific abnormal finding of lung field: Secondary | ICD-10-CM | POA: Insufficient documentation

## 2021-07-15 DIAGNOSIS — Z8711 Personal history of peptic ulcer disease: Secondary | ICD-10-CM | POA: Diagnosis not present

## 2021-07-15 DIAGNOSIS — M25519 Pain in unspecified shoulder: Secondary | ICD-10-CM | POA: Diagnosis not present

## 2021-07-15 DIAGNOSIS — G2581 Restless legs syndrome: Secondary | ICD-10-CM | POA: Insufficient documentation

## 2021-07-15 DIAGNOSIS — Z79899 Other long term (current) drug therapy: Secondary | ICD-10-CM | POA: Diagnosis not present

## 2021-07-15 DIAGNOSIS — E538 Deficiency of other specified B group vitamins: Secondary | ICD-10-CM | POA: Diagnosis not present

## 2021-07-15 DIAGNOSIS — D509 Iron deficiency anemia, unspecified: Secondary | ICD-10-CM

## 2021-07-15 DIAGNOSIS — I110 Hypertensive heart disease with heart failure: Secondary | ICD-10-CM | POA: Diagnosis not present

## 2021-07-15 DIAGNOSIS — D500 Iron deficiency anemia secondary to blood loss (chronic): Secondary | ICD-10-CM | POA: Insufficient documentation

## 2021-07-15 DIAGNOSIS — Z9049 Acquired absence of other specified parts of digestive tract: Secondary | ICD-10-CM | POA: Insufficient documentation

## 2021-07-15 DIAGNOSIS — I5022 Chronic systolic (congestive) heart failure: Secondary | ICD-10-CM | POA: Diagnosis not present

## 2021-07-15 DIAGNOSIS — E785 Hyperlipidemia, unspecified: Secondary | ICD-10-CM | POA: Diagnosis not present

## 2021-07-15 LAB — CBC WITH DIFFERENTIAL/PLATELET
Abs Immature Granulocytes: 0.01 10*3/uL (ref 0.00–0.07)
Basophils Absolute: 0 10*3/uL (ref 0.0–0.1)
Basophils Relative: 0 %
Eosinophils Absolute: 0.2 10*3/uL (ref 0.0–0.5)
Eosinophils Relative: 4 %
HCT: 33.2 % — ABNORMAL LOW (ref 36.0–46.0)
Hemoglobin: 10.8 g/dL — ABNORMAL LOW (ref 12.0–15.0)
Immature Granulocytes: 0 %
Lymphocytes Relative: 31 %
Lymphs Abs: 1.5 10*3/uL (ref 0.7–4.0)
MCH: 29.7 pg (ref 26.0–34.0)
MCHC: 32.5 g/dL (ref 30.0–36.0)
MCV: 91.2 fL (ref 80.0–100.0)
Monocytes Absolute: 0.6 10*3/uL (ref 0.1–1.0)
Monocytes Relative: 13 %
Neutro Abs: 2.5 10*3/uL (ref 1.7–7.7)
Neutrophils Relative %: 52 %
Platelets: 235 10*3/uL (ref 150–400)
RBC: 3.64 MIL/uL — ABNORMAL LOW (ref 3.87–5.11)
RDW: 13.1 % (ref 11.5–15.5)
WBC: 4.9 10*3/uL (ref 4.0–10.5)
nRBC: 0 % (ref 0.0–0.2)

## 2021-07-15 LAB — IRON AND TIBC
Iron: 49 ug/dL (ref 28–170)
Saturation Ratios: 14 % (ref 10.4–31.8)
TIBC: 360 ug/dL (ref 250–450)
UIBC: 311 ug/dL

## 2021-07-15 LAB — FERRITIN: Ferritin: 51 ng/mL (ref 11–307)

## 2021-07-18 ENCOUNTER — Ambulatory Visit
Admission: RE | Admit: 2021-07-18 | Discharge: 2021-07-18 | Disposition: A | Discharge status: Home / Self Care | Payer: Medicare Other | Source: Ambulatory Visit | Attending: Oncology | Admitting: Oncology

## 2021-07-18 ENCOUNTER — Inpatient Hospital Stay: Payer: Medicare Other

## 2021-07-18 ENCOUNTER — Ambulatory Visit
Admission: RE | Admit: 2021-07-18 | Discharge: 2021-07-18 | Disposition: A | Discharge status: Home / Self Care | Payer: Medicare Other | Attending: Oncology | Admitting: Oncology

## 2021-07-18 ENCOUNTER — Inpatient Hospital Stay (HOSPITAL_BASED_OUTPATIENT_CLINIC_OR_DEPARTMENT_OTHER): Payer: Medicare Other | Admitting: Oncology

## 2021-07-18 ENCOUNTER — Encounter: Payer: Self-pay | Admitting: Oncology

## 2021-07-18 VITALS — BP 158/76 | HR 83 | Temp 97.4°F | Resp 18 | Wt 117.6 lb

## 2021-07-18 VITALS — BP 152/56 | HR 73 | Resp 20

## 2021-07-18 DIAGNOSIS — M898X1 Other specified disorders of bone, shoulder: Secondary | ICD-10-CM

## 2021-07-18 DIAGNOSIS — D500 Iron deficiency anemia secondary to blood loss (chronic): Secondary | ICD-10-CM

## 2021-07-18 DIAGNOSIS — D509 Iron deficiency anemia, unspecified: Secondary | ICD-10-CM

## 2021-07-18 MED ORDER — SODIUM CHLORIDE 0.9 % IV SOLN
Freq: Once | INTRAVENOUS | Status: AC
  Filled 2021-07-18: qty 250

## 2021-07-18 MED ORDER — IRON SUCROSE 20 MG/ML IV SOLN: 200.0000 mg | Freq: Once | INTRAVENOUS | Status: DC

## 2021-07-18 MED ORDER — SODIUM CHLORIDE 0.9 % IV SOLN
200.0000 mg | Freq: Once | INTRAVENOUS | Status: AC
  Administered 2021-07-18: 200 mg via INTRAVENOUS
  Filled 2021-07-18: qty 10

## 2021-07-18 NOTE — Progress Notes (Signed)
Patient here for follow up. Pt reports she had a fall and wen to ER. She does have a bump on her right clavicle, which she is concerned about.

## 2021-07-18 NOTE — Progress Notes (Signed)
Hematology/Oncology Progress note [phone] [phone]     Clinic day:  10/19/17  Chief Complaint: Tanya Rhodes is a 81 y.o. female with a history of iron deficiency anemia present for follow-up.  PERTINENT HEMATOLOGY HISTORY Patient was previously followed by Dr. Merlene Pulling.   Patient establish care with me on 05/30/2018 when she had a virtual visit with me. Extensive chart review was performed by me.  Patient had a history of gastric ulcer years ago and a colonic polyp status post partial colectomy in 2013.  Colonoscopy on 05/15/2013 reviewed patent ileocolonic anastomosis and internal hemorrhoids.  EGD on 05/15/2013 was normal.  Patient has iron deficiency and has been receiving IV Venofer if ferritin is less than 30. History of B12 deficiency is taking oral B12 supplementation.   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  Smoking history greater than 30-pack-year.  CT chest angiogram 04/01/2014 shows no pulmonary embolism but a 4 mm left upper lobe pulmonary nodule.  Chest CT to 03/04/18 17 revealed a stable 3 mm lingular nodule.  There were new groundglass opacities in the right upper lobe.  Right middle lobe and right lower lobe suggesting infectious or inflammatory process. CT chest 07/30/2015 revealed a stable 3 mm left lower lobe nodule. Patient follows up with [facility] gastroenterology Dr. Mechele Collin.  EGD and colonoscopy in November 2021. EGD showed esophagitis, hematin in the gastric antrum.  Pathology showed reactive gastropathy with focal ulceration and active inflammation. Colonoscopy showed a diverticulosis.  Nonbleeding internal hemorrhoids.  No specimen was collected. Recommend patient to continue follow-up with gastroenterology.  INTERVAL HISTORY Tanya Rhodes is a 81 y.o. female who has above history reviewed by me today presents for follow up visit for iron deficiency anemia. She recently had a fall and went to emergency room.  She noticed a bump  on her right clavicle which she is concerned about.  The area is tender. .  Past Medical History:  Diagnosis Date   Allergic rhinitis    Anxiety    Centrilobular emphysema (HCC)    Chronic systolic CHF (congestive heart failure) (HCC)    Colon polyposis    COPD (chronic obstructive pulmonary disease) (HCC)    Coronary artery disease involving native coronary artery of native heart without angina pectoris    DDD (degenerative disc disease), lumbar    Dysrhythmia    Essential hypertension    GERD (gastroesophageal reflux disease)    HLD (hyperlipidemia)    Hypertension    IDA (iron deficiency anemia)    Low serum vitamin D    Lumbar radiculitis    Lumbar spondylitis (HCC)    Lumbar stenosis with neurogenic claudication    Melena    PUD (peptic ulcer disease)    Restless leg    Rotator cuff rupture    VHD (valvular heart disease)     Past Surgical History:  Procedure Laterality Date   ABDOMINAL HYSTERECTOMY     APPENDECTOMY     BREAST BIOPSY Right 11/20/11   neg [place] core   COLECTOMY     COLONOSCOPY WITH PROPOFOL N/A 12/08/2019   Procedure: COLONOSCOPY WITH PROPOFOL;  Surgeon: Regis Bill, MD;  Location: [facility] END[facility];  Service: Endoscopy;  Laterality: N/A;   CORONARY ANGIOPLASTY WITH STENT PLACEMENT     ESOPHAGOGASTRODUODENOSCOPY (EGD) WITH PROPOFOL N/A 12/08/2019   Procedure: ESOPHAGOGASTRODUODENOSCOPY (EGD) WITH PROPOFOL;  Surgeon: Regis Bill, MD;  Location: [facility] ENDOSCOPY;  Service: Endoscopy;  Laterality: N/A;   TONSILLECTOMY      Family  History  Problem Relation Age of Onset   Heart disease Brother    Heart disease Sister     Social History:  reports that she quit smoking about 27 years ago. Her smoking use included cigarettes. She has a 78.75 pack-year smoking history. She has never used smokeless tobacco. She reports that she does not drink alcohol and does not use drugs.  She has a greater than 30 pack year smoking history.  She quit smoking in  1995.    Allergies:  Allergies  Allergen Reactions   Aspirin Other (See Comments)    GIB   Ropinirole Anxiety and Other (See Comments)    Reaction:  Severe leg pain    Ferrous Fumarate Itching   Ferrous Sulfate Diarrhea and Nausea And Vomiting   Omeprazole Nausea And Vomiting   Oxycodone Other (See Comments)    Reaction:  Makes pt hyper    Prednisone Other (See Comments)    Reaction:  Makes pt hyper     Current Medications: Current Outpatient Medications  Medication Sig Dispense Refill   acetaminophen (TYLENOL) 500 MG tablet Take 500 mg by mouth every 6 (six) hours as needed for mild pain, moderate pain, fever or headache.      albuterol (PROVENTIL HFA;VENTOLIN HFA) 108 (90 Base) MCG/ACT inhaler Inhale 2 puffs into the lungs every 4 (four) hours as needed for wheezing or shortness of breath.     albuterol (PROVENTIL) (2.5 MG/3ML) 0.083% nebulizer solution Take 2.5 mg by nebulization every 6 (six) hours as needed for wheezing or shortness of breath.     ALPRAZolam (XANAX) 0.25 MG tablet Take 0.25 mg by mouth at bedtime as needed for anxiety or sleep.     budesonide-formoterol (SYMBICORT) 160-4.5 MCG/ACT inhaler Inhale 2 puffs into the lungs 2 (two) times daily.     calcium citrate-vitamin D (CITRACAL+D) 315-200 MG-UNIT tablet Take 1 tablet by mouth daily.     cyanocobalamin 1000 MCG tablet Take 1,000 mcg by mouth daily.     escitalopram (LEXAPRO) 10 MG tablet Take 10 mg by mouth daily.  0   fluticasone (FLONASE) 50 MCG/ACT nasal spray Place 2 sprays into both nostrils daily.     furosemide (LASIX) 20 MG tablet Take 20 mg by mouth daily.     gabapentin (NEURONTIN) 300 MG capsule Take 300 mg by mouth daily.     lisinopril (PRINIVIL,ZESTRIL) 2.5 MG tablet Take 2.5 mg by mouth daily.     metoprolol succinate (TOPROL-XL) 25 MG 24 hr tablet Take 25 mg by mouth daily.  1   SPIRIVA RESPIMAT 2.5 MCG/ACT AERS Inhale 2 puffs into the lungs daily.   0   traMADol (ULTRAM) 50 MG tablet Take 75 mg  by mouth 2 (two) times daily as needed for moderate pain.      Vitamin D, Ergocalciferol, (DRISDOL) 50000 units CAPS capsule Take 50,000 Units by mouth every 7 (seven) days. Pt takes on Saturday.     No current facility-administered medications for this visit.    Review of Systems  Constitutional:  Positive for fatigue. Negative for appetite change, chills and fever.  HENT:   Negative for hearing loss and voice change.   Eyes:  Negative for eye problems.  Respiratory:  Negative for chest tightness, cough and shortness of breath.   Cardiovascular:  Negative for chest pain.  Gastrointestinal:  Negative for abdominal distention, abdominal pain and blood in stool.  Endocrine: Negative for hot flashes.  Genitourinary:  Negative for difficulty urinating and frequency.  Musculoskeletal:  Negative for arthralgias.  Skin:  Negative for itching and rash.  Neurological:  Negative for extremity weakness.  Hematological:  Negative for adenopathy.  Psychiatric/Behavioral:  Negative for confusion.       Physical Exam: Blood pressure (!) 158/76, pulse 83, temperature (!) 97.4 F (36.3 C), resp. rate 18, weight 117 lb 9.6 oz (53.3 kg), SpO2 99 %, peak flow (!) 2 L/min. Physical Exam Constitutional:      General: She is not in acute distress.    Appearance: She is not diaphoretic.  HENT:     Head: Normocephalic and atraumatic.     Mouth/Throat:     Pharynx: No oropharyngeal exudate.  Eyes:     General: No scleral icterus.    Pupils: Pupils are equal, round, and reactive to light.  Cardiovascular:     Rate and Rhythm: Normal rate and regular rhythm.     Heart sounds: No murmur heard. Pulmonary:     Effort: Pulmonary effort is normal. No respiratory distress.     Breath sounds: No rales.     Comments: Severely decreased breath sound bilaterally.  Patient uses via nasal cannula oxygen Chest:     Chest wall: No tenderness.  Abdominal:     General: There is no distension.     Palpations:  Abdomen is soft.     Tenderness: There is no abdominal tenderness.  Musculoskeletal:        General: Normal range of motion.     Cervical back: Normal range of motion and neck supple.     Comments: Right clavicle tenderness, prominent  Skin:    General: Skin is warm and dry.     Findings: No erythema.  Neurological:     Mental Status: She is alert and oriented to person, place, and time.     Cranial Nerves: No cranial nerve deficit.     Motor: No abnormal muscle tone.     Coordination: Coordination normal.  Psychiatric:        Mood and Affect: Affect normal.      No visits with results within 3 Day(s) from this visit.  Latest known visit with results is:  Appointment on 07/15/2021  Component Date Value Ref Range Status   Iron 07/15/2021 49  28 - 170 ug/dL Final   TIBC 90/24/0973 360  250 - 450 ug/dL Final   Saturation Ratios 07/15/2021 14  10.4 - 31.8 % Final   UIBC 07/15/2021 311  ug/dL Final   Performed at [facility], [address] [place], [place] [address]   Ferritin 07/15/2021 51  11 - 307 ng/mL Final   Performed at [facility], [address] [place], [place] [address]   WBC 07/15/2021 4.9  4.0 - 10.5 K/uL Final   RBC 07/15/2021 3.64 (L)  3.87 - 5.11 MIL/uL Final   Hemoglobin 07/15/2021 10.8 (L)  12.0 - 15.0 g/dL Final   HCT 34/19/6222 33.2 (L)  36.0 - 46.0 % Final   MCV 07/15/2021 91.2  80.0 - 100.0 fL Final   MCH 07/15/2021 29.7  26.0 - 34.0 pg Final   MCHC 07/15/2021 32.5  30.0 - 36.0 g/dL Final   RDW 97/98/9211 13.1  11.5 - 15.5 % Final   Platelets 07/15/2021 235  150 - 400 K/uL Final   nRBC 07/15/2021 0.0  0.0 - 0.2 % Final   Neutrophils Relative % 07/15/2021 52  % Final   Neutro Abs 07/15/2021 2.5  1.7 - 7.7 K/uL Final  Lymphocytes Relative 07/15/2021 31  % Final   Lymphs Abs 07/15/2021 1.5  0.7 - 4.0 K/uL Final   Monocytes Relative 07/15/2021 13  % Final   Monocytes Absolute 07/15/2021 0.6  0.1 - 1.0 K/uL Final   Eosinophils  Relative 07/15/2021 4  % Final   Eosinophils Absolute 07/15/2021 0.2  0.0 - 0.5 K/uL Final   Basophils Relative 07/15/2021 0  % Final   Basophils Absolute 07/15/2021 0.0  0.0 - 0.1 K/uL Final   Immature Granulocytes 07/15/2021 0  % Final   Abs Immature Granulocytes 07/15/2021 0.01  0.00 - 0.07 K/uL Final   Performed at [facility], [address], [place], [place] [address]    Assessment and plan  1. Iron deficiency anemia due to chronic blood loss   2. Clavicle pain    #History of iron deficiency anemia. Labs are reviewed and discussed with patient. Hemoglobin has decreased to 10.8, iron saturation is borderline at 14, ferritin 51. She cannot tolerate oral iron. Given her history of gastric ulcer, history of partial colectomy, I recommend 1 dose of IV Venofer 200 mg for maintenance.  Right clavicle tenderness status post fall check right clavicle x-ray Recommend warm compress, Tylenol as needed.  Follow  Up in 6 months.   We spent sufficient time to discuss many aspect of care, questions were answered to patient's satisfaction.  Rickard Patience, MD  10/19/2017, 9:09 PM

## 2021-07-18 NOTE — Patient Instructions (Signed)

## 2021-08-09 ENCOUNTER — Other Ambulatory Visit
Admission: RE | Admit: 2021-08-09 | Discharge: 2021-08-09 | Disposition: A | Discharge status: Home / Self Care | Payer: Medicare Other | Source: Ambulatory Visit | Attending: Specialist | Admitting: Specialist

## 2021-08-09 DIAGNOSIS — W19XXXA Unspecified fall, initial encounter: Secondary | ICD-10-CM | POA: Diagnosis not present

## 2021-08-09 DIAGNOSIS — R0602 Shortness of breath: Secondary | ICD-10-CM | POA: Diagnosis present

## 2021-08-09 DIAGNOSIS — R071 Chest pain on breathing: Secondary | ICD-10-CM | POA: Diagnosis present

## 2021-08-09 DIAGNOSIS — X58XXXA Exposure to other specified factors, initial encounter: Secondary | ICD-10-CM | POA: Diagnosis not present

## 2021-08-09 DIAGNOSIS — J449 Chronic obstructive pulmonary disease, unspecified: Secondary | ICD-10-CM | POA: Diagnosis not present

## 2021-08-09 LAB — D-DIMER, QUANTITATIVE: D-Dimer, Quant: 0.77 ug/mL-FEU — ABNORMAL HIGH (ref 0.00–0.50)

## 2021-08-10 ENCOUNTER — Ambulatory Visit
Admission: RE | Admit: 2021-08-10 | Discharge: 2021-08-10 | Disposition: A | Discharge status: Home / Self Care | Payer: Medicare Other | Source: Ambulatory Visit | Attending: Specialist | Admitting: Specialist

## 2021-08-10 ENCOUNTER — Other Ambulatory Visit: Payer: Self-pay | Admitting: Specialist

## 2021-08-10 DIAGNOSIS — R7989 Other specified abnormal findings of blood chemistry: Secondary | ICD-10-CM

## 2021-08-10 MED ORDER — IOHEXOL 350 MG/ML SOLN
75.0000 mL | Freq: Once | INTRAVENOUS | Status: AC | PRN
  Administered 2021-08-10: 75 mL via INTRAVENOUS

## 2022-01-18 ENCOUNTER — Inpatient Hospital Stay: Payer: Medicare Other | Attending: Oncology

## 2022-01-18 DIAGNOSIS — Z87891 Personal history of nicotine dependence: Secondary | ICD-10-CM | POA: Insufficient documentation

## 2022-01-18 DIAGNOSIS — Z79899 Other long term (current) drug therapy: Secondary | ICD-10-CM | POA: Insufficient documentation

## 2022-01-18 DIAGNOSIS — E785 Hyperlipidemia, unspecified: Secondary | ICD-10-CM | POA: Insufficient documentation

## 2022-01-18 DIAGNOSIS — J449 Chronic obstructive pulmonary disease, unspecified: Secondary | ICD-10-CM | POA: Insufficient documentation

## 2022-01-18 DIAGNOSIS — J432 Centrilobular emphysema: Secondary | ICD-10-CM | POA: Insufficient documentation

## 2022-01-18 DIAGNOSIS — I5022 Chronic systolic (congestive) heart failure: Secondary | ICD-10-CM | POA: Insufficient documentation

## 2022-01-18 DIAGNOSIS — Z7951 Long term (current) use of inhaled steroids: Secondary | ICD-10-CM | POA: Insufficient documentation

## 2022-01-18 DIAGNOSIS — K219 Gastro-esophageal reflux disease without esophagitis: Secondary | ICD-10-CM | POA: Insufficient documentation

## 2022-01-18 DIAGNOSIS — E538 Deficiency of other specified B group vitamins: Secondary | ICD-10-CM | POA: Insufficient documentation

## 2022-01-18 DIAGNOSIS — Z8711 Personal history of peptic ulcer disease: Secondary | ICD-10-CM | POA: Insufficient documentation

## 2022-01-18 DIAGNOSIS — Z8719 Personal history of other diseases of the digestive system: Secondary | ICD-10-CM | POA: Insufficient documentation

## 2022-01-18 DIAGNOSIS — I2510 Atherosclerotic heart disease of native coronary artery without angina pectoris: Secondary | ICD-10-CM | POA: Insufficient documentation

## 2022-01-18 DIAGNOSIS — D509 Iron deficiency anemia, unspecified: Secondary | ICD-10-CM | POA: Insufficient documentation

## 2022-01-18 DIAGNOSIS — I110 Hypertensive heart disease with heart failure: Secondary | ICD-10-CM | POA: Insufficient documentation

## 2022-01-19 ENCOUNTER — Inpatient Hospital Stay: Payer: Medicare Other

## 2022-01-19 DIAGNOSIS — Z7951 Long term (current) use of inhaled steroids: Secondary | ICD-10-CM | POA: Diagnosis not present

## 2022-01-19 DIAGNOSIS — E538 Deficiency of other specified B group vitamins: Secondary | ICD-10-CM | POA: Diagnosis not present

## 2022-01-19 DIAGNOSIS — J432 Centrilobular emphysema: Secondary | ICD-10-CM | POA: Diagnosis not present

## 2022-01-19 DIAGNOSIS — Z79899 Other long term (current) drug therapy: Secondary | ICD-10-CM | POA: Diagnosis not present

## 2022-01-19 DIAGNOSIS — Z87891 Personal history of nicotine dependence: Secondary | ICD-10-CM | POA: Diagnosis not present

## 2022-01-19 DIAGNOSIS — K219 Gastro-esophageal reflux disease without esophagitis: Secondary | ICD-10-CM | POA: Diagnosis not present

## 2022-01-19 DIAGNOSIS — Z8719 Personal history of other diseases of the digestive system: Secondary | ICD-10-CM | POA: Diagnosis not present

## 2022-01-19 DIAGNOSIS — D509 Iron deficiency anemia, unspecified: Secondary | ICD-10-CM | POA: Diagnosis not present

## 2022-01-19 DIAGNOSIS — E785 Hyperlipidemia, unspecified: Secondary | ICD-10-CM | POA: Diagnosis not present

## 2022-01-19 DIAGNOSIS — Z8711 Personal history of peptic ulcer disease: Secondary | ICD-10-CM | POA: Diagnosis not present

## 2022-01-19 DIAGNOSIS — J449 Chronic obstructive pulmonary disease, unspecified: Secondary | ICD-10-CM | POA: Diagnosis not present

## 2022-01-19 DIAGNOSIS — I5022 Chronic systolic (congestive) heart failure: Secondary | ICD-10-CM | POA: Diagnosis not present

## 2022-01-19 DIAGNOSIS — D500 Iron deficiency anemia secondary to blood loss (chronic): Secondary | ICD-10-CM

## 2022-01-19 DIAGNOSIS — I110 Hypertensive heart disease with heart failure: Secondary | ICD-10-CM | POA: Diagnosis not present

## 2022-01-19 DIAGNOSIS — I2510 Atherosclerotic heart disease of native coronary artery without angina pectoris: Secondary | ICD-10-CM | POA: Diagnosis not present

## 2022-01-19 LAB — CBC WITH DIFFERENTIAL/PLATELET
Abs Immature Granulocytes: 0.02 10*3/uL (ref 0.00–0.07)
Basophils Absolute: 0 10*3/uL (ref 0.0–0.1)
Basophils Relative: 1 %
Eosinophils Absolute: 0.1 10*3/uL (ref 0.0–0.5)
Eosinophils Relative: 2 %
HCT: 34.1 % — ABNORMAL LOW (ref 36.0–46.0)
Hemoglobin: 11.1 g/dL — ABNORMAL LOW (ref 12.0–15.0)
Immature Granulocytes: 0 %
Lymphocytes Relative: 42 %
Lymphs Abs: 2.4 10*3/uL (ref 0.7–4.0)
MCH: 28.3 pg (ref 26.0–34.0)
MCHC: 32.6 g/dL (ref 30.0–36.0)
MCV: 87 fL (ref 80.0–100.0)
Monocytes Absolute: 0.6 10*3/uL (ref 0.1–1.0)
Monocytes Relative: 11 %
Neutro Abs: 2.4 10*3/uL (ref 1.7–7.7)
Neutrophils Relative %: 44 %
Platelets: 251 10*3/uL (ref 150–400)
RBC: 3.92 MIL/uL (ref 3.87–5.11)
RDW: 13.6 % (ref 11.5–15.5)
WBC: 5.6 10*3/uL (ref 4.0–10.5)
nRBC: 0 % (ref 0.0–0.2)

## 2022-01-19 LAB — RETIC PANEL
Immature Retic Fract: 4 % (ref 2.3–15.9)
RBC.: 3.94 MIL/uL (ref 3.87–5.11)
Retic Count, Absolute: 69.3 10*3/uL (ref 19.0–186.0)
Retic Ct Pct: 1.8 % (ref 0.4–3.1)
Reticulocyte Hemoglobin: 32.4 pg (ref >27.9)

## 2022-01-19 LAB — COMPREHENSIVE METABOLIC PANEL
ALT: 19 U/L (ref 0–44)
AST: 17 U/L (ref 15–41)
Albumin: 4.1 g/dL (ref 3.5–5.0)
Alkaline Phosphatase: 62 U/L (ref 38–126)
Anion gap: 8 (ref 5–15)
BUN: 20 mg/dL (ref 8–23)
CO2: 26 mmol/L (ref 22–32)
Calcium: 8.7 mg/dL — ABNORMAL LOW (ref 8.9–10.3)
Chloride: 100 mmol/L (ref 98–111)
Creatinine, Ser: 0.92 mg/dL (ref 0.44–1.00)
GFR, Estimated: >60 mL/min (ref >60)
Glucose, Bld: 91 mg/dL (ref 70–99)
Potassium: 4.1 mmol/L (ref 3.5–5.1)
Sodium: 134 mmol/L — ABNORMAL LOW (ref 135–145)
Total Bilirubin: 0.2 mg/dL — ABNORMAL LOW (ref 0.3–1.2)
Total Protein: 6.4 g/dL — ABNORMAL LOW (ref 6.5–8.1)

## 2022-01-19 LAB — IRON AND TIBC
Iron: 62 ug/dL (ref 28–170)
Saturation Ratios: 16 % (ref 10.4–31.8)
TIBC: 385 ug/dL (ref 250–450)
UIBC: 323 ug/dL

## 2022-01-19 LAB — FOLATE: Folate: 20.9 ng/mL (ref >5.9)

## 2022-01-19 LAB — FERRITIN: Ferritin: 48 ng/mL (ref 11–307)

## 2022-01-19 LAB — VITAMIN B12: Vitamin B-12: 325 pg/mL (ref 180–914)

## 2022-01-23 ENCOUNTER — Inpatient Hospital Stay (HOSPITAL_BASED_OUTPATIENT_CLINIC_OR_DEPARTMENT_OTHER): Payer: Medicare Other | Admitting: Oncology

## 2022-01-23 ENCOUNTER — Encounter: Payer: Self-pay | Admitting: Oncology

## 2022-01-23 ENCOUNTER — Inpatient Hospital Stay: Payer: Medicare Other

## 2022-01-23 VITALS — BP 174/57 | HR 94 | Temp 96.3°F | Wt 112.5 lb

## 2022-01-23 VITALS — BP 145/48 | HR 73

## 2022-01-23 DIAGNOSIS — D509 Iron deficiency anemia, unspecified: Secondary | ICD-10-CM | POA: Diagnosis not present

## 2022-01-23 DIAGNOSIS — E538 Deficiency of other specified B group vitamins: Secondary | ICD-10-CM

## 2022-01-23 MED ORDER — SODIUM CHLORIDE 0.9 % IV SOLN
INTRAVENOUS | Status: DC
  Filled 2022-01-23 (×2): qty 250

## 2022-01-23 MED ORDER — SODIUM CHLORIDE 0.9 % IV SOLN
200.0000 mg | Freq: Once | INTRAVENOUS | Status: AC
  Administered 2022-01-23: 200 mg via INTRAVENOUS
  Filled 2022-01-23: qty 200

## 2022-01-23 NOTE — Progress Notes (Signed)
Hematology/Oncology Progress note [phone] [phone]     Clinic day:  10/19/17  Chief Complaint: Tanya Rhodes is a 82 y.o. female with a history of iron deficiency anemia present for follow-up.  ASSESSMENT & PLAN:   Iron deficiency anemia #History of iron deficiency anemia. Labs are reviewed and discussed with patient. Hemoglobin 11.1, iron saturation is borderline at 16, ferritin 46. She cannot tolerate oral iron. Given her history of gastric ulcer, history of partial colectomy, I recommend 1 dose of IV Venofer 200 mg for maintenance.  B12 deficiency B12 level 300s.  Recommend patient to continue B12 supplementation   Orders Placed This Encounter  Procedures   CBC with Differential    Standing Status:   Standing    Number of Occurrences:   1    Standing Expiration Date:   01/24/2023   CBC with Differential/Platelet    Standing Status:   Standing    Number of Occurrences:   1    Standing Expiration Date:   01/24/2023   Ferritin    Standing Status:   Standing    Number of Occurrences:   1    Standing Expiration Date:   01/24/2023   Iron and TIBC    Standing Status:   Standing    Number of Occurrences:   1    Standing Expiration Date:   01/24/2023   Vitamin B12    Standing Status:   Standing    Number of Occurrences:   1    Standing Expiration Date:   01/24/2023   Follow up in 6 months.  All questions were answered. The patient knows to call the clinic with any problems, questions or concerns.  Earlie Server, MD, PhD [facility] Hematology Oncology 01/23/2022    PERTINENT HEMATOLOGY HISTORY Patient was previously followed by Dr. Mike Gip.   Patient establish care with me on 05/30/2018 when she had a virtual visit with me. Extensive chart review was performed by me.  Patient had a history of gastric ulcer years ago and a colonic polyp status post partial colectomy in 2013.  Colonoscopy on 05/15/2013 reviewed patent ileocolonic anastomosis and  internal hemorrhoids.  EGD on 05/15/2013 was normal.  Patient has iron deficiency and has been receiving IV Venofer if ferritin is less than 30. History of B12 deficiency is taking oral B12 supplementation.   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  Smoking history greater than 30-pack-year.  CT chest angiogram 04/01/2014 shows no pulmonary embolism but a 4 mm left upper lobe pulmonary nodule.  Chest CT to 03/04/18 17 revealed a stable 3 mm lingular nodule.  There were new groundglass opacities in the right upper lobe.  Right middle lobe and right lower lobe suggesting infectious or inflammatory process. CT chest 07/30/2015 revealed a stable 3 mm left lower lobe nodule. Patient follows up with [facility] gastroenterology Dr. Vira Agar.  EGD and colonoscopy in November 2021. EGD showed esophagitis, hematin in the gastric antrum.  Pathology showed reactive gastropathy with focal ulceration and active inflammation. Colonoscopy showed a diverticulosis.  Nonbleeding internal hemorrhoids.  No specimen was collected. Recommend patient to continue follow-up with gastroenterology.  INTERVAL HISTORY Tanya Rhodes is a 82 y.o. female who has above history reviewed by me today presents for follow up visit for iron deficiency anemia. Chronic respiriatory failure, follows up with pulmonology. SOB is worse.  No other new complaints.  .  Past Medical History:  Diagnosis Date   Allergic rhinitis    Anxiety  Centrilobular emphysema (HCC)    Chronic systolic CHF (congestive heart failure) (HCC)    Colon polyposis    COPD (chronic obstructive pulmonary disease) (HCC)    Coronary artery disease involving native coronary artery of native heart without angina pectoris    DDD (degenerative disc disease), lumbar    Dysrhythmia    Essential hypertension    GERD (gastroesophageal reflux disease)    HLD (hyperlipidemia)    Hypertension    IDA (iron deficiency anemia)    Low serum vitamin D    Lumbar  radiculitis    Lumbar spondylitis (HCC)    Lumbar stenosis with neurogenic claudication    Melena    PUD (peptic ulcer disease)    Restless leg    Rotator cuff rupture    VHD (valvular heart disease)     Past Surgical History:  Procedure Laterality Date   ABDOMINAL HYSTERECTOMY     APPENDECTOMY     BREAST BIOPSY Right 11/20/11   neg [place] core   COLECTOMY     COLONOSCOPY WITH PROPOFOL N/A 12/08/2019   Procedure: COLONOSCOPY WITH PROPOFOL;  Surgeon: Lesly Rubenstein, MD;  Location: [facility] ENDOSCOPY;  Service: Endoscopy;  Laterality: N/A;   CORONARY ANGIOPLASTY WITH STENT PLACEMENT     ESOPHAGOGASTRODUODENOSCOPY (EGD) WITH PROPOFOL N/A 12/08/2019   Procedure: ESOPHAGOGASTRODUODENOSCOPY (EGD) WITH PROPOFOL;  Surgeon: Lesly Rubenstein, MD;  Location: [facility] ENDOSCOPY;  Service: Endoscopy;  Laterality: N/A;   TONSILLECTOMY      Family History  Problem Relation Age of Onset   Heart disease Brother    Heart disease Sister     Social History:  reports that she quit smoking about 28 years ago. Her smoking use included cigarettes. She has a 78.75 pack-year smoking history. She has never used smokeless tobacco. She reports that she does not drink alcohol and does not use drugs.  She has a greater than 30 pack year smoking history.  She quit smoking in 1995.    Allergies:  Allergies  Allergen Reactions   Aspirin Other (See Comments)    GIB   Ropinirole Anxiety and Other (See Comments)    Reaction:  Severe leg pain    Ferrous Fumarate Itching   Ferrous Sulfate Diarrhea and Nausea And Vomiting   Omeprazole Nausea And Vomiting   Oxycodone Other (See Comments)    Reaction:  Makes pt hyper    Prednisone Other (See Comments)    Reaction:  Makes pt hyper     Current Medications: Current Outpatient Medications  Medication Sig Dispense Refill   acetaminophen (TYLENOL) 500 MG tablet Take 500 mg by mouth every 6 (six) hours as needed for mild pain, moderate pain, fever or headache.       albuterol (PROVENTIL HFA;VENTOLIN HFA) 108 (90 Base) MCG/ACT inhaler Inhale 2 puffs into the lungs every 4 (four) hours as needed for wheezing or shortness of breath.     albuterol (PROVENTIL) (2.5 MG/3ML) 0.083% nebulizer solution Take 2.5 mg by nebulization every 6 (six) hours as needed for wheezing or shortness of breath.     ALPRAZolam (XANAX) 0.25 MG tablet Take 0.25 mg by mouth at bedtime as needed for anxiety or sleep.     budesonide-formoterol (SYMBICORT) 160-4.5 MCG/ACT inhaler Inhale 2 puffs into the lungs 2 (two) times daily.     calcium citrate-vitamin D (CITRACAL+D) 315-200 MG-UNIT tablet Take 1 tablet by mouth daily.     cyanocobalamin 1000 MCG tablet Take 1,000 mcg by mouth daily.     escitalopram (  LEXAPRO) 10 MG tablet Take 10 mg by mouth daily.  0   fluticasone (FLONASE) 50 MCG/ACT nasal spray Place 2 sprays into both nostrils daily.     gabapentin (NEURONTIN) 300 MG capsule Take 300 mg by mouth daily.     lisinopril (PRINIVIL,ZESTRIL) 2.5 MG tablet Take 2.5 mg by mouth daily.     metoprolol succinate (TOPROL-XL) 25 MG 24 hr tablet Take 25 mg by mouth daily.  1   SPIRIVA RESPIMAT 2.5 MCG/ACT AERS Inhale 2 puffs into the lungs daily.   0   traMADol (ULTRAM) 50 MG tablet Take 75 mg by mouth 2 (two) times daily as needed for moderate pain.      Vitamin D, Ergocalciferol, (DRISDOL) 50000 units CAPS capsule Take 50,000 Units by mouth every 7 (seven) days. Pt takes on Saturday.     furosemide (LASIX) 20 MG tablet Take 20 mg by mouth daily. (Patient not taking: Reported on 01/23/2022)     No current facility-administered medications for this visit.   Facility-Administered Medications Ordered in Other Visits  Medication Dose Route Frequency Provider Last Rate Last Admin   0.9 %  sodium chloride infusion   Intravenous Continuous Rickard Patience, MD   Stopped at 01/23/22 1627    Review of Systems  Constitutional:  Positive for fatigue. Negative for appetite change, chills and fever.  HENT:    Negative for hearing loss and voice change.   Eyes:  Negative for eye problems.  Respiratory:  Negative for chest tightness, cough and shortness of breath.   Cardiovascular:  Negative for chest pain.  Gastrointestinal:  Negative for abdominal distention, abdominal pain and blood in stool.  Endocrine: Negative for hot flashes.  Genitourinary:  Negative for difficulty urinating and frequency.   Musculoskeletal:  Negative for arthralgias.  Skin:  Negative for itching and rash.  Neurological:  Negative for extremity weakness.  Hematological:  Negative for adenopathy.  Psychiatric/Behavioral:  Negative for confusion.       Physical Exam: Blood pressure (!) 174/57, pulse 94, temperature (!) 96.3 F (35.7 C), temperature source Tympanic, weight 112 lb 8 oz (51 kg), SpO2 100 %, peak flow (!) 2 L/min. Physical Exam Constitutional:      General: She is not in acute distress.    Appearance: She is not diaphoretic.  HENT:     Head: Normocephalic and atraumatic.     Mouth/Throat:     Pharynx: No oropharyngeal exudate.  Eyes:     General: No scleral icterus.    Pupils: Pupils are equal, round, and reactive to light.  Cardiovascular:     Rate and Rhythm: Normal rate and regular rhythm.     Heart sounds: No murmur heard. Pulmonary:     Effort: Pulmonary effort is normal. No respiratory distress.     Breath sounds: No rales.     Comments: Severely decreased breath sound bilaterally.  Patient uses via nasal cannula oxygen Chest:     Chest wall: No tenderness.  Abdominal:     General: There is no distension.     Palpations: Abdomen is soft.     Tenderness: There is no abdominal tenderness.  Musculoskeletal:        General: Normal range of motion.     Cervical back: Normal range of motion and neck supple.     Comments: Right clavicle tenderness, prominent  Skin:    General: Skin is warm and dry.     Findings: No erythema.  Neurological:     Mental Status:  She is alert and oriented to  person, place, and time.     Cranial Nerves: No cranial nerve deficit.     Motor: No abnormal muscle tone.     Coordination: Coordination normal.  Psychiatric:        Mood and Affect: Affect normal.      No visits with results within 3 Day(s) from this visit.  Latest known visit with results is:  Appointment on 01/19/2022  Component Date Value Ref Range Status   Vitamin B-12 01/19/2022 325  180 - 914 pg/mL Final   Comment: (NOTE) This assay is not validated for testing neonatal or myeloproliferative syndrome specimens for Vitamin B12 levels. Performed at [facility], [address] [place], [place] [address]    Ferritin 01/19/2022 48  11 - 307 ng/mL Final   Performed at [facility], [address] [place], [place] [address]   Retic Ct Pct 01/19/2022 1.8  0.4 - 3.1 % Final   RBC. 01/19/2022 3.94  3.87 - 5.11 MIL/uL Final   Retic Count, Absolute 01/19/2022 69.3  19.0 - 186.0 K/uL Final   Immature Retic Fract 01/19/2022 4.0  2.3 - 15.9 % Final   Reticulocyte Hemoglobin 01/19/2022 32.4  >27.9 pg Final   Comment:        Given the high negative predictive value of a RET-He result > 32 pg iron deficiency is essentially excluded. If this patient is anemic other etiologies should be considered. Performed at [facility], [address] [place], [place] [address]    Iron 01/19/2022 62  28 - 170 ug/dL Final   TIBC 91/47/8295 385  250 - 450 ug/dL Final   Saturation Ratios 01/19/2022 16  10.4 - 31.8 % Final   UIBC 01/19/2022 323  ug/dL Final   Performed at [facility], [address] [place], [place] [address]   Folate 01/19/2022 20.9  >5.9 ng/mL Final   Performed at [facility], [address] [place], [place] [address]   WBC 01/19/2022 5.6  4.0 - 10.5 K/uL Final   RBC 01/19/2022 3.92  3.87 - 5.11 MIL/uL Final   Hemoglobin 01/19/2022 11.1 (L)  12.0 - 15.0 g/dL Final   HCT 46/96/2952 34.1 (L)  36.0 - 46.0 % Final   MCV 01/19/2022  87.0  80.0 - 100.0 fL Final   MCH 01/19/2022 28.3  26.0 - 34.0 pg Final   MCHC 01/19/2022 32.6  30.0 - 36.0 g/dL Final   RDW 84/13/2440 13.6  11.5 - 15.5 % Final   Platelets 01/19/2022 251  150 - 400 K/uL Final   nRBC 01/19/2022 0.0  0.0 - 0.2 % Final   Neutrophils Relative % 01/19/2022 44  % Final   Neutro Abs 01/19/2022 2.4  1.7 - 7.7 K/uL Final   Lymphocytes Relative 01/19/2022 42  % Final   Lymphs Abs 01/19/2022 2.4  0.7 - 4.0 K/uL Final   Monocytes Relative 01/19/2022 11  % Final   Monocytes Absolute 01/19/2022 0.6  0.1 - 1.0 K/uL Final   Eosinophils Relative 01/19/2022 2  % Final   Eosinophils Absolute 01/19/2022 0.1  0.0 - 0.5 K/uL Final   Basophils Relative 01/19/2022 1  % Final   Basophils Absolute 01/19/2022 0.0  0.0 - 0.1 K/uL Final   Immature Granulocytes 01/19/2022 0  % Final   Abs Immature Granulocytes 01/19/2022 0.02  0.00 - 0.07 K/uL Final   Performed at [facility], [address] [place], [place] [address]  Sodium 01/19/2022 134 (L)  135 - 145 mmol/L Final   Potassium 01/19/2022 4.1  3.5 - 5.1 mmol/L Final   Chloride 01/19/2022 100  98 - 111 mmol/L Final   CO2 01/19/2022 26  22 - 32 mmol/L Final   Glucose, Bld 01/19/2022 91  70 - 99 mg/dL Final   Glucose reference range applies only to samples taken after fasting for at least 8 hours.   BUN 01/19/2022 20  8 - 23 mg/dL Final   Creatinine, Ser 01/19/2022 0.92  0.44 - 1.00 mg/dL Final   Calcium 25/05/3974 8.7 (L)  8.9 - 10.3 mg/dL Final   Total Protein 73/41/9379 6.4 (L)  6.5 - 8.1 g/dL Final   Albumin 02/40/9735 4.1  3.5 - 5.0 g/dL Final   AST 32/99/2426 17  15 - 41 U/L Final   ALT 01/19/2022 19  0 - 44 U/L Final   Alkaline Phosphatase 01/19/2022 62  38 - 126 U/L Final   Total Bilirubin 01/19/2022 0.2 (L)  0.3 - 1.2 mg/dL Final   GFR, Estimated 01/19/2022 >60  >60 mL/min Final   Comment: (NOTE) Calculated using the CKD-EPI Creatinine Equation (2021)    Anion gap 01/19/2022 8  5 - 15 Final   Performed  at [facility], [address] [place], [place] [address]

## 2022-01-23 NOTE — Assessment & Plan Note (Signed)
#  History of iron deficiency anemia. Labs are reviewed and discussed with patient. Hemoglobin 11.1, iron saturation is borderline at 16, ferritin 46. She cannot tolerate oral iron. Given her history of gastric ulcer, history of partial colectomy, I recommend 1 dose of IV Venofer 200 mg for maintenance.

## 2022-01-23 NOTE — Patient Instructions (Signed)
Iron Sucrose Injection What is this medication? IRON SUCROSE (EYE ern SOO krose) treats low levels of iron (iron deficiency anemia) in people with kidney disease. Iron is a mineral that plays an important role in making red blood cells, which carry oxygen from your lungs to the rest of your body. This medicine may be used for other purposes; ask your health care provider or pharmacist if you have questions. COMMON BRAND NAME(S): Venofer What should I tell my care team before I take this medication? They need to know if you have any of these conditions: Anemia not caused by low iron levels Heart disease High levels of iron in the blood Kidney disease Liver disease An unusual or allergic reaction to iron, other medications, foods, dyes, or preservatives Pregnant or trying to get pregnant Breastfeeding How should I use this medication? This medication is for infusion into a vein. It is given in a hospital or clinic setting. Talk to your care team about the use of this medication in children. While this medication may be prescribed for children as young as 2 years for selected conditions, precautions do apply. Overdosage: If you think you have taken too much of this medicine contact a poison control center or emergency room at once. NOTE: This medicine is only for you. Do not share this medicine with others. What if I miss a dose? Keep appointments for follow-up doses. It is important not to miss your dose. Call your care team if you are unable to keep an appointment. What may interact with this medication? Do not take this medication with any of the following: Deferoxamine Dimercaprol Other iron products This medication may also interact with the following: Chloramphenicol Deferasirox This list may not describe all possible interactions. Give your health care provider a list of all the medicines, herbs, non-prescription drugs, or dietary supplements you use. Also tell them if you smoke,  drink alcohol, or use illegal drugs. Some items may interact with your medicine. What should I watch for while using this medication? Visit your care team regularly. Tell your care team if your symptoms do not start to get better or if they get worse. You may need blood work done while you are taking this medication. You may need to follow a special diet. Talk to your care team. Foods that contain iron include: whole grains/cereals, dried fruits, beans, or peas, leafy green vegetables, and organ meats (liver, kidney). What side effects may I notice from receiving this medication? Side effects that you should report to your care team as soon as possible: Allergic reactions--skin rash, itching, hives, swelling of the face, lips, tongue, or throat Low blood pressure--dizziness, feeling faint or lightheaded, blurry vision Shortness of breath Side effects that usually do not require medical attention (report to your care team if they continue or are bothersome): Flushing Headache Joint pain Muscle pain Nausea Pain, redness, or irritation at injection site This list may not describe all possible side effects. Call your doctor for medical advice about side effects. You may report side effects to FDA at 1-800-FDA-1088. Where should I keep my medication? This medication is given in a hospital or clinic and will not be stored at home. NOTE: This sheet is a summary. It may not cover all possible information. If you have questions about this medicine, talk to your doctor, pharmacist, or health care provider.  2023 Elsevier/Gold Standard (2020-04-08 00:00:00)

## 2022-01-23 NOTE — Assessment & Plan Note (Signed)
B12 level 300s.  Recommend patient to continue B12 supplementation

## 2022-06-24 ENCOUNTER — Encounter: Payer: Self-pay | Admitting: Oncology

## 2022-06-26 ENCOUNTER — Other Ambulatory Visit: Payer: Self-pay | Admitting: Internal Medicine

## 2022-06-26 DIAGNOSIS — Z1231 Encounter for screening mammogram for malignant neoplasm of breast: Secondary | ICD-10-CM

## 2022-07-05 ENCOUNTER — Ambulatory Visit
Admission: RE | Admit: 2022-07-05 | Discharge: 2022-07-05 | Disposition: A | Discharge status: Home / Self Care | Payer: 59 | Source: Ambulatory Visit | Attending: Internal Medicine | Admitting: Internal Medicine

## 2022-07-05 DIAGNOSIS — Z1231 Encounter for screening mammogram for malignant neoplasm of breast: Secondary | ICD-10-CM | POA: Insufficient documentation

## 2022-07-11 ENCOUNTER — Other Ambulatory Visit: Payer: Self-pay | Admitting: Internal Medicine

## 2022-07-11 DIAGNOSIS — N63 Unspecified lump in unspecified breast: Secondary | ICD-10-CM

## 2022-07-11 DIAGNOSIS — R928 Other abnormal and inconclusive findings on diagnostic imaging of breast: Secondary | ICD-10-CM

## 2022-07-14 ENCOUNTER — Ambulatory Visit
Admission: RE | Admit: 2022-07-14 | Discharge: 2022-07-14 | Disposition: A | Discharge status: Home / Self Care | Payer: 59 | Source: Ambulatory Visit | Attending: Internal Medicine | Admitting: Internal Medicine

## 2022-07-14 DIAGNOSIS — R928 Other abnormal and inconclusive findings on diagnostic imaging of breast: Secondary | ICD-10-CM

## 2022-07-14 DIAGNOSIS — N63 Unspecified lump in unspecified breast: Secondary | ICD-10-CM

## 2022-07-18 ENCOUNTER — Other Ambulatory Visit: Payer: Self-pay | Admitting: Internal Medicine

## 2022-07-18 DIAGNOSIS — N63 Unspecified lump in unspecified breast: Secondary | ICD-10-CM

## 2022-07-18 DIAGNOSIS — R928 Other abnormal and inconclusive findings on diagnostic imaging of breast: Secondary | ICD-10-CM

## 2022-07-21 ENCOUNTER — Other Ambulatory Visit: Payer: Self-pay | Admitting: Internal Medicine

## 2022-07-21 ENCOUNTER — Ambulatory Visit
Admission: RE | Admit: 2022-07-21 | Discharge: 2022-07-21 | Disposition: A | Discharge status: Home / Self Care | Payer: 59 | Source: Ambulatory Visit | Attending: Internal Medicine | Admitting: Internal Medicine

## 2022-07-21 DIAGNOSIS — N6091 Unspecified benign mammary dysplasia of right breast: Secondary | ICD-10-CM

## 2022-07-21 DIAGNOSIS — N63 Unspecified lump in unspecified breast: Secondary | ICD-10-CM | POA: Insufficient documentation

## 2022-07-21 DIAGNOSIS — R928 Other abnormal and inconclusive findings on diagnostic imaging of breast: Secondary | ICD-10-CM | POA: Insufficient documentation

## 2022-07-21 HISTORY — DX: Unspecified benign mammary dysplasia of right breast: N60.91

## 2022-07-21 HISTORY — PX: BREAST BIOPSY: SHX20

## 2022-07-21 MED ORDER — LIDOCAINE HCL 1 % IJ SOLN
2.0000 mL | Freq: Once | INTRAMUSCULAR | Status: AC
  Administered 2022-07-21: 2 mL via INTRADERMAL

## 2022-07-21 MED ORDER — LIDOCAINE HCL 1 % IJ SOLN
2.0000 mL | Freq: Once | INTRAMUSCULAR | Status: AC
  Administered 2022-07-21: 2 mL via INTRADERMAL
  Filled 2022-07-21: qty 2

## 2022-07-21 MED ORDER — LIDOCAINE-EPINEPHRINE 1 %-1:100000 IJ SOLN
5.0000 mL | Freq: Once | INTRAMUSCULAR | Status: AC
  Administered 2022-07-21: 5 mL

## 2022-07-21 MED ORDER — LIDOCAINE-EPINEPHRINE 1 %-1:100000 IJ SOLN
6.0000 mL | Freq: Once | INTRAMUSCULAR | Status: AC
  Administered 2022-07-21: 6 mL
  Filled 2022-07-21: qty 6

## 2022-07-24 ENCOUNTER — Inpatient Hospital Stay: Payer: 59

## 2022-07-25 ENCOUNTER — Encounter: Payer: Self-pay | Admitting: *Deleted

## 2022-07-25 ENCOUNTER — Inpatient Hospital Stay: Payer: 59 | Attending: Oncology

## 2022-07-25 DIAGNOSIS — Z8719 Personal history of other diseases of the digestive system: Secondary | ICD-10-CM | POA: Diagnosis not present

## 2022-07-25 DIAGNOSIS — J432 Centrilobular emphysema: Secondary | ICD-10-CM | POA: Insufficient documentation

## 2022-07-25 DIAGNOSIS — Z87891 Personal history of nicotine dependence: Secondary | ICD-10-CM | POA: Insufficient documentation

## 2022-07-25 DIAGNOSIS — I110 Hypertensive heart disease with heart failure: Secondary | ICD-10-CM | POA: Diagnosis not present

## 2022-07-25 DIAGNOSIS — N6099 Unspecified benign mammary dysplasia of unspecified breast: Secondary | ICD-10-CM | POA: Diagnosis not present

## 2022-07-25 DIAGNOSIS — K219 Gastro-esophageal reflux disease without esophagitis: Secondary | ICD-10-CM | POA: Insufficient documentation

## 2022-07-25 DIAGNOSIS — E785 Hyperlipidemia, unspecified: Secondary | ICD-10-CM | POA: Diagnosis not present

## 2022-07-25 DIAGNOSIS — J449 Chronic obstructive pulmonary disease, unspecified: Secondary | ICD-10-CM | POA: Diagnosis not present

## 2022-07-25 DIAGNOSIS — Z79899 Other long term (current) drug therapy: Secondary | ICD-10-CM | POA: Insufficient documentation

## 2022-07-25 DIAGNOSIS — I509 Heart failure, unspecified: Secondary | ICD-10-CM | POA: Diagnosis not present

## 2022-07-25 DIAGNOSIS — D509 Iron deficiency anemia, unspecified: Secondary | ICD-10-CM | POA: Diagnosis not present

## 2022-07-25 DIAGNOSIS — F17210 Nicotine dependence, cigarettes, uncomplicated: Secondary | ICD-10-CM | POA: Insufficient documentation

## 2022-07-25 DIAGNOSIS — I2510 Atherosclerotic heart disease of native coronary artery without angina pectoris: Secondary | ICD-10-CM | POA: Diagnosis not present

## 2022-07-25 DIAGNOSIS — Z7951 Long term (current) use of inhaled steroids: Secondary | ICD-10-CM | POA: Diagnosis not present

## 2022-07-25 DIAGNOSIS — Z8711 Personal history of peptic ulcer disease: Secondary | ICD-10-CM | POA: Insufficient documentation

## 2022-07-25 DIAGNOSIS — I5022 Chronic systolic (congestive) heart failure: Secondary | ICD-10-CM | POA: Diagnosis not present

## 2022-07-25 LAB — CBC WITH DIFFERENTIAL/PLATELET
Abs Immature Granulocytes: 0.01 10*3/uL (ref 0.00–0.07)
Basophils Absolute: 0 10*3/uL (ref 0.0–0.1)
Basophils Relative: 1 %
Eosinophils Absolute: 0.1 10*3/uL (ref 0.0–0.5)
Eosinophils Relative: 2 %
HCT: 29.8 % — ABNORMAL LOW (ref 36.0–46.0)
Hemoglobin: 9.7 g/dL — ABNORMAL LOW (ref 12.0–15.0)
Immature Granulocytes: 0 %
Lymphocytes Relative: 45 %
Lymphs Abs: 1.9 10*3/uL (ref 0.7–4.0)
MCH: 27.2 pg (ref 26.0–34.0)
MCHC: 32.6 g/dL (ref 30.0–36.0)
MCV: 83.5 fL (ref 80.0–100.0)
Monocytes Absolute: 0.4 10*3/uL (ref 0.1–1.0)
Monocytes Relative: 8 %
Neutro Abs: 1.9 10*3/uL (ref 1.7–7.7)
Neutrophils Relative %: 44 %
Platelets: 246 10*3/uL (ref 150–400)
RBC: 3.57 MIL/uL — ABNORMAL LOW (ref 3.87–5.11)
RDW: 13.6 % (ref 11.5–15.5)
WBC: 4.3 10*3/uL (ref 4.0–10.5)
nRBC: 0 % (ref 0.0–0.2)

## 2022-07-25 LAB — VITAMIN B12: Vitamin B-12: 552 pg/mL (ref 180–914)

## 2022-07-25 LAB — IRON AND TIBC
Iron: 48 ug/dL (ref 28–170)
Saturation Ratios: 11 % (ref 10.4–31.8)
TIBC: 441 ug/dL (ref 250–450)
UIBC: 393 ug/dL

## 2022-07-25 LAB — FERRITIN: Ferritin: 6 ng/mL — ABNORMAL LOW (ref 11–307)

## 2022-07-25 NOTE — Progress Notes (Signed)
Referral recieved from [facility] for benign breast mass.  Per Randa Lynn, patient does not want to be called until tomorrow to discuss sugical referral.

## 2022-07-26 ENCOUNTER — Inpatient Hospital Stay: Payer: 59

## 2022-07-26 ENCOUNTER — Inpatient Hospital Stay: Payer: 59 | Admitting: Oncology

## 2022-07-26 ENCOUNTER — Encounter: Payer: Self-pay | Admitting: Oncology

## 2022-07-26 VITALS — BP 124/52 | HR 78 | Temp 96.9°F | Resp 18 | Wt 106.6 lb

## 2022-07-26 VITALS — BP 122/47 | HR 75

## 2022-07-26 DIAGNOSIS — E538 Deficiency of other specified B group vitamins: Secondary | ICD-10-CM

## 2022-07-26 DIAGNOSIS — D500 Iron deficiency anemia secondary to blood loss (chronic): Secondary | ICD-10-CM | POA: Diagnosis not present

## 2022-07-26 DIAGNOSIS — D509 Iron deficiency anemia, unspecified: Secondary | ICD-10-CM | POA: Diagnosis not present

## 2022-07-26 DIAGNOSIS — N6099 Unspecified benign mammary dysplasia of unspecified breast: Secondary | ICD-10-CM

## 2022-07-26 MED ORDER — SODIUM CHLORIDE 0.9 % IV SOLN
200.0000 mg | Freq: Once | INTRAVENOUS | Status: AC
  Administered 2022-07-26: 200 mg via INTRAVENOUS
  Filled 2022-07-26: qty 200

## 2022-07-26 MED ORDER — SODIUM CHLORIDE 0.9 % IV SOLN
Freq: Once | INTRAVENOUS | Status: AC
  Filled 2022-07-26: qty 250

## 2022-07-26 NOTE — Progress Notes (Signed)
Pt here for follow up. Reports that se had biopsy on right breast on Monday.

## 2022-07-26 NOTE — Progress Notes (Deleted)
Pt declined 30 minute post observation. Pt verbalized understanding of risks.

## 2022-07-26 NOTE — Assessment & Plan Note (Addendum)
#  History of iron deficiency anemia. Labs are reviewed and discussed with patient. Lab Results  Component Value Date   HGB 9.7 (L) 07/25/2022   TIBC 441 07/25/2022   IRONPCTSAT 11 07/25/2022   FERRITIN 6 (L) 07/25/2022     She cannot tolerate oral iron. Recommend IV Venofer weekly x 4

## 2022-07-26 NOTE — Assessment & Plan Note (Signed)
-  continue B12 supplementation.

## 2022-07-26 NOTE — Progress Notes (Signed)
Hematology/Oncology Progress note [phone] [phone]     Clinic day:  10/19/17  Chief Complaint: Tanya Rhodes is a 82 y.o. female presents to follow-up for iron deficiency anemia, atypical ductal hyperplasia of the breast.  ASSESSMENT & PLAN:   Iron deficiency anemia #History of iron deficiency anemia. Labs are reviewed and discussed with patient. Lab Results  Component Value Date   HGB 9.7 (L) 07/25/2022   TIBC 441 07/25/2022   IRONPCTSAT 11 07/25/2022   FERRITIN 6 (L) 07/25/2022     She cannot tolerate oral iron. Recommend IV Venofer weekly x 4  B12 deficiency continue B12 supplementation  Atypical ductal hyperplasia of breast Mammogram results and biopsy pathology were reviewed and discussed with patient. Atypical ductal hyperplasia is associated with a generalized, bilateral increase in breast cancer risk. I recommend patient to establish care with surgery for evaluation of resection. After resection, I recommend annual mammogram surveillance.   Will also plan to discuss with her about chemoprevention option.   Orders Placed This Encounter  Procedures   CBC with Differential (Cancer Center Only)    Standing Status:   Future    Standing Expiration Date:   07/26/2023   Ferritin    Standing Status:   Future    Standing Expiration Date:   07/26/2023   Iron and TIBC    Standing Status:   Future    Standing Expiration Date:   07/26/2023   Ambulatory referral to General Surgery    Referral Priority:   Routine    Referral Type:   Surgical    Referral Reason:   Specialty Services Required    Referred to Provider:   Carolan Shiver, MD    Requested Specialty:   General Surgery    Number of Visits Requested:   1   Follow up in 3 months.  All questions were answered. The patient knows to call the clinic with any problems, questions or concerns.  Rickard Patience, MD, PhD [facility] Hematology Oncology 07/26/2022    PERTINENT HEMATOLOGY  HISTORY Patient was previously followed by Dr. Merlene Pulling.   Patient establish care with me on 05/30/2018 when she had a virtual visit with me. Extensive chart review was performed by me.  Patient had a history of gastric ulcer years ago and a colonic polyp status post partial colectomy in 2013.  Colonoscopy on 05/15/2013 reviewed patent ileocolonic anastomosis and internal hemorrhoids.  EGD on 05/15/2013 was normal.  Patient has iron deficiency and has been receiving IV Venofer if ferritin is less than 30. History of B12 deficiency is taking oral B12 supplementation.   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  Smoking history greater than 30-pack-year.  CT chest angiogram 04/01/2014 shows no pulmonary embolism but a 4 mm left upper lobe pulmonary nodule.  Chest CT to 03/04/18 17 revealed a stable 3 mm lingular nodule.  There were new groundglass opacities in the right upper lobe.  Right middle lobe and right lower lobe suggesting infectious or inflammatory process. CT chest 07/30/2015 revealed a stable 3 mm left lower lobe nodule. Patient follows up with [facility] gastroenterology Dr. Mechele Collin.  EGD and colonoscopy in November 2021. EGD showed esophagitis, hematin in the gastric antrum.  Pathology showed reactive gastropathy with focal ulceration and active inflammation. Colonoscopy showed a diverticulosis.  Nonbleeding internal hemorrhoids.  No specimen was collected. Recommend patient to continue follow-up with gastroenterology.  INTERVAL HISTORY Tanya Rhodes is a 81 y.o. female who has above history reviewed by me today presents  for follow up visit for iron deficiency anemia. Chronic respiriatory failure, follows up with pulmonology.  Chronic shortness of breath. 07/05/2022, bilateral screening mammogram showed right breast mass warrants further evaluation. 07/14/2022 unilateral right diagnostic mammogram showed indeterminate right breast mass at 11 o'clock position 3 cm from nipple.  No suspicious  right axillary lymphadenopathy. Patient underwent ultrasound-guided breast biopsy.  Pathology showed 1. Breast, right, needle core biopsy, 10:00 4cmfn, heart clip - FIBROCYSTIC CHANGES INCLUDING CYSTIC DILATATION, COLUMNAR CELL CHANGE AND STROMAL FIBROSIS - NEGATIVE FOR MALIGNANCY 2. Breast, right, needle core biopsy, 11:00 3 cmfn, venus clip - ATYPICAL DUCTAL HYPERPLASIA (ADH) INVOLVING AN INTRADUCTAL PAPILLOMA WITH FOCAL SCLEROSIS - FIBROCYSTIC CHANGES - SEE NOTE     Past Medical History:  Diagnosis Date   Allergic rhinitis    Anxiety    Centrilobular emphysema (HCC)    Chronic systolic CHF (congestive heart failure) (HCC)    Colon polyposis    COPD (chronic obstructive pulmonary disease) (HCC)    Coronary artery disease involving native coronary artery of native heart without angina pectoris    DDD (degenerative disc disease), lumbar    Dysrhythmia    Essential hypertension    GERD (gastroesophageal reflux disease)    HLD (hyperlipidemia)    Hypertension    IDA (iron deficiency anemia)    Low serum vitamin D    Lumbar radiculitis    Lumbar spondylitis (HCC)    Lumbar stenosis with neurogenic claudication    Melena    PUD (peptic ulcer disease)    Restless leg    Rotator cuff rupture    VHD (valvular heart disease)     Past Surgical History:  Procedure Laterality Date   ABDOMINAL HYSTERECTOMY     APPENDECTOMY     BREAST BIOPSY Right 11/20/2011   neg [place] core   BREAST BIOPSY Right 07/21/2022   [place] Bx, Venus clip, path pending   BREAST BIOPSY Right 07/21/2022   [place] BX, Heart Clip, path pending   BREAST BIOPSY Right 07/21/2022   [place] RT BREAST BX W LOC DEV EA ADD LESION IMG BX SPEC US GUIDE 07/21/2022 [facility]   BREAST BIOPSY Right 07/21/2022   [place] RT BREAST BX W LOC DEV 1ST LESION IMG BX SPEC US GUIDE 07/21/2022 [facility]   COLECTOMY     COLONOSCOPY WITH PROPOFOL N/A 12/08/2019   Procedure: COLONOSCOPY WITH PROPOFOL;  Surgeon: Regis Bill, MD;   Location: [facility];  Service: Endoscopy;  Laterality: N/A;   CORONARY ANGIOPLASTY WITH STENT PLACEMENT     ESOPHAGOGASTRODUODENOSCOPY (EGD) WITH PROPOFOL N/A 12/08/2019   Procedure: ESOPHAGOGASTRODUODENOSCOPY (EGD) WITH PROPOFOL;  Surgeon: Regis Bill, MD;  Location: [facility] ENDOSCOPY;  Service: Endoscopy;  Laterality: N/A;   TONSILLECTOMY      Family History  Problem Relation Age of Onset   Heart disease Sister    Cancer Brother        prostate cancer   Heart disease Brother     Social History:  reports that she quit smoking about 28 years ago. Her smoking use included cigarettes. She started smoking about 81 years ago. She has a 78.8 pack-year smoking history. She has never used smokeless tobacco. She reports that she does not drink alcohol and does not use drugs.  She has a greater than 30 pack year smoking history.  She quit smoking in 1995.    Allergies:  Allergies  Allergen Reactions   Aspirin Other (See Comments)    GIB   Ropinirole Anxiety and Other (See  Comments)    Reaction:  Severe leg pain    Ferrous Fumarate Itching   Ferrous Sulfate Diarrhea and Nausea And Vomiting   Omeprazole Nausea And Vomiting   Oxycodone Other (See Comments)    Reaction:  Makes pt hyper    Prednisone Other (See Comments)    Reaction:  Makes pt hyper     Current Medications: Current Outpatient Medications  Medication Sig Dispense Refill   acetaminophen (TYLENOL) 500 MG tablet Take 500 mg by mouth every 6 (six) hours as needed for mild pain, moderate pain, fever or headache.      albuterol (PROVENTIL HFA;VENTOLIN HFA) 108 (90 Base) MCG/ACT inhaler Inhale 2 puffs into the lungs every 4 (four) hours as needed for wheezing or shortness of breath.     albuterol (PROVENTIL) (2.5 MG/3ML) 0.083% nebulizer solution Take 2.5 mg by nebulization every 6 (six) hours as needed for wheezing or shortness of breath.     ALPRAZolam (XANAX) 0.25 MG tablet Take 0.25 mg by mouth at bedtime as needed for  anxiety or sleep.     budesonide-formoterol (SYMBICORT) 160-4.5 MCG/ACT inhaler Inhale 2 puffs into the lungs 2 (two) times daily.     calcium citrate-vitamin D (CITRACAL+D) 315-200 MG-UNIT tablet Take 1 tablet by mouth daily.     cyanocobalamin 1000 MCG tablet Take 1,000 mcg by mouth daily.     escitalopram (LEXAPRO) 10 MG tablet Take 10 mg by mouth daily.  0   fluticasone (FLONASE) 50 MCG/ACT nasal spray Place 2 sprays into both nostrils daily.     furosemide (LASIX) 20 MG tablet Take 20 mg by mouth daily.     gabapentin (NEURONTIN) 300 MG capsule Take 300 mg by mouth daily.     lisinopril (PRINIVIL,ZESTRIL) 2.5 MG tablet Take 2.5 mg by mouth daily.     metoprolol succinate (TOPROL-XL) 25 MG 24 hr tablet Take 25 mg by mouth daily.  1   SPIRIVA RESPIMAT 2.5 MCG/ACT AERS Inhale 2 puffs into the lungs daily.   0   traMADol (ULTRAM) 50 MG tablet Take 75 mg by mouth 2 (two) times daily as needed for moderate pain.      Vitamin D, Ergocalciferol, (DRISDOL) 50000 units CAPS capsule Take 50,000 Units by mouth every 7 (seven) days. Pt takes on Saturday.     No current facility-administered medications for this visit.    Review of Systems  Constitutional:  Positive for fatigue. Negative for appetite change, chills and fever.  HENT:   Negative for hearing loss and voice change.   Eyes:  Negative for eye problems.  Respiratory:  Negative for chest tightness, cough and shortness of breath.   Cardiovascular:  Negative for chest pain.  Gastrointestinal:  Negative for abdominal distention, abdominal pain and blood in stool.  Endocrine: Negative for hot flashes.  Genitourinary:  Negative for difficulty urinating and frequency.   Musculoskeletal:  Negative for arthralgias.  Skin:  Negative for itching and rash.  Neurological:  Negative for extremity weakness.  Hematological:  Negative for adenopathy.  Psychiatric/Behavioral:  Negative for confusion.       Physical Exam: Blood pressure (!) 124/52,  pulse 78, temperature (!) 96.9 F (36.1 C), resp. rate 18, weight 106 lb 9.6 oz (48.4 kg), SpO2 99%. Physical Exam Constitutional:      General: She is not in acute distress.    Appearance: She is not diaphoretic.  HENT:     Head: Normocephalic and atraumatic.     Mouth/Throat:     Pharynx:  No oropharyngeal exudate.  Eyes:     General: No scleral icterus.    Pupils: Pupils are equal, round, and reactive to light.  Cardiovascular:     Rate and Rhythm: Normal rate and regular rhythm.     Heart sounds: No murmur heard. Pulmonary:     Effort: Pulmonary effort is normal. No respiratory distress.     Breath sounds: No rales.     Comments: Severely decreased breath sound bilaterally.  Patient uses via nasal cannula oxygen Chest:     Chest wall: No tenderness.  Abdominal:     General: There is no distension.     Palpations: Abdomen is soft.     Tenderness: There is no abdominal tenderness.  Musculoskeletal:        General: Normal range of motion.     Cervical back: Normal range of motion and neck supple.     Comments: Right clavicle tenderness, prominent  Skin:    General: Skin is warm and dry.     Findings: No erythema.  Neurological:     Mental Status: She is alert and oriented to person, place, and time.     Cranial Nerves: No cranial nerve deficit.     Motor: No abnormal muscle tone.     Coordination: Coordination normal.  Psychiatric:        Mood and Affect: Affect normal.      Appointment on 07/25/2022  Component Date Value Ref Range Status   Vitamin B-12 07/25/2022 552  180 - 914 pg/mL Final   Comment: (NOTE) This assay is not validated for testing neonatal or myeloproliferative syndrome specimens for Vitamin B12 levels. Performed at [facility], [address] [place], [place] [address]    Iron 07/25/2022 48  28 - 170 ug/dL Final   TIBC 27/25/3664 441  250 - 450 ug/dL Final   Saturation Ratios 07/25/2022 11  10.4 - 31.8 % Final   UIBC 07/25/2022 393   ug/dL Final   Performed at [facility], [address] [place], [place] [address]   Ferritin 07/25/2022 6 (L)  11 - 307 ng/mL Final   Performed at [facility], [address] [place], [place] [address]   WBC 07/25/2022 4.3  4.0 - 10.5 K/uL Final   RBC 07/25/2022 3.57 (L)  3.87 - 5.11 MIL/uL Final   Hemoglobin 07/25/2022 9.7 (L)  12.0 - 15.0 g/dL Final   HCT 63/87/5643 29.8 (L)  36.0 - 46.0 % Final   MCV 07/25/2022 83.5  80.0 - 100.0 fL Final   MCH 07/25/2022 27.2  26.0 - 34.0 pg Final   MCHC 07/25/2022 32.6  30.0 - 36.0 g/dL Final   RDW 32/95/1884 13.6  11.5 - 15.5 % Final   Platelets 07/25/2022 246  150 - 400 K/uL Final   nRBC 07/25/2022 0.0  0.0 - 0.2 % Final   Neutrophils Relative % 07/25/2022 44  % Final   Neutro Abs 07/25/2022 1.9  1.7 - 7.7 K/uL Final   Lymphocytes Relative 07/25/2022 45  % Final   Lymphs Abs 07/25/2022 1.9  0.7 - 4.0 K/uL Final   Monocytes Relative 07/25/2022 8  % Final   Monocytes Absolute 07/25/2022 0.4  0.1 - 1.0 K/uL Final   Eosinophils Relative 07/25/2022 2  % Final   Eosinophils Absolute 07/25/2022 0.1  0.0 - 0.5 K/uL Final   Basophils Relative 07/25/2022 1  % Final   Basophils Absolute 07/25/2022 0.0  0.0 - 0.1 K/uL Final  Immature Granulocytes 07/25/2022 0  % Final   Abs Immature Granulocytes 07/25/2022 0.01  0.00 - 0.07 K/uL Final   Performed at [facility], [address] [place], [place] [address]

## 2022-07-26 NOTE — Assessment & Plan Note (Addendum)
Mammogram results and biopsy pathology were reviewed and discussed with patient. Atypical ductal hyperplasia is associated with a generalized, bilateral increase in breast cancer risk. I recommend patient to establish care with surgery for evaluation of resection. After resection, I recommend annual mammogram surveillance.   Will also plan to discuss with her about chemoprevention option.

## 2022-07-27 ENCOUNTER — Telehealth: Payer: Self-pay

## 2022-07-27 NOTE — Telephone Encounter (Signed)
Call placed to Tanya Rhodes to arrange surgery referral for breast finding. She states she saw Dr. Cathie Hoops 7/17 and she has sent referral to [facility] surgery. Referral noted. No further navigation needs at this time.

## 2022-08-03 ENCOUNTER — Inpatient Hospital Stay: Payer: 59

## 2022-08-03 VITALS — BP 121/48 | HR 75 | Temp 97.6°F | Resp 16

## 2022-08-03 DIAGNOSIS — D509 Iron deficiency anemia, unspecified: Secondary | ICD-10-CM

## 2022-08-03 MED ORDER — SODIUM CHLORIDE 0.9 % IV SOLN
INTRAVENOUS | Status: DC
  Filled 2022-08-03: qty 250

## 2022-08-03 MED ORDER — SODIUM CHLORIDE 0.9 % IV SOLN
200.0000 mg | Freq: Once | INTRAVENOUS | Status: AC
  Administered 2022-08-03: 200 mg via INTRAVENOUS
  Filled 2022-08-03: qty 200

## 2022-08-03 NOTE — Patient Instructions (Signed)
Iron Sucrose Injection What is this medication? IRON SUCROSE (EYE ern SOO krose) treats low levels of iron (iron deficiency anemia) in people with kidney disease. Iron is a mineral that plays an important role in making red blood cells, which carry oxygen from your lungs to the rest of your body. This medicine may be used for other purposes; ask your health care provider or pharmacist if you have questions. COMMON BRAND NAME(S): Venofer What should I tell my care team before I take this medication? They need to know if you have any of these conditions: Anemia not caused by low iron levels Heart disease High levels of iron in the blood Kidney disease Liver disease An unusual or allergic reaction to iron, other medications, foods, dyes, or preservatives Pregnant or trying to get pregnant Breastfeeding How should I use this medication? This medication is for infusion into a vein. It is given in a hospital or clinic setting. Talk to your care team about the use of this medication in children. While this medication may be prescribed for children as young as 2 years for selected conditions, precautions do apply. Overdosage: If you think you have taken too much of this medicine contact a poison control center or emergency room at once. NOTE: This medicine is only for you. Do not share this medicine with others. What if I miss a dose? Keep appointments for follow-up doses. It is important not to miss your dose. Call your care team if you are unable to keep an appointment. What may interact with this medication? Do not take this medication with any of the following: Deferoxamine Dimercaprol Other iron products This medication may also interact with the following: Chloramphenicol Deferasirox This list may not describe all possible interactions. Give your health care provider a list of all the medicines, herbs, non-prescription drugs, or dietary supplements you use. Also tell them if you smoke,  drink alcohol, or use illegal drugs. Some items may interact with your medicine. What should I watch for while using this medication? Visit your care team regularly. Tell your care team if your symptoms do not start to get better or if they get worse. You may need blood work done while you are taking this medication. You may need to follow a special diet. Talk to your care team. Foods that contain iron include: whole grains/cereals, dried fruits, beans, or peas, leafy green vegetables, and organ meats (liver, kidney). What side effects may I notice from receiving this medication? Side effects that you should report to your care team as soon as possible: Allergic reactions--skin rash, itching, hives, swelling of the face, lips, tongue, or throat Low blood pressure--dizziness, feeling faint or lightheaded, blurry vision Shortness of breath Side effects that usually do not require medical attention (report to your care team if they continue or are bothersome): Flushing Headache Joint pain Muscle pain Nausea Pain, redness, or irritation at injection site This list may not describe all possible side effects. Call your doctor for medical advice about side effects. You may report side effects to FDA at 1-800-FDA-1088. Where should I keep my medication? This medication is given in a hospital or clinic. It will not be stored at home. NOTE: This sheet is a summary. It may not cover all possible information. If you have questions about this medicine, talk to your doctor, pharmacist, or health care provider.  2024 Elsevier/Gold Standard (2022-06-02 00:00:00)

## 2022-08-10 ENCOUNTER — Inpatient Hospital Stay: Payer: 59

## 2022-08-14 ENCOUNTER — Inpatient Hospital Stay: Payer: 59 | Attending: Oncology

## 2022-08-14 VITALS — BP 116/63 | HR 75 | Temp 97.5°F | Resp 16

## 2022-08-14 DIAGNOSIS — Z79899 Other long term (current) drug therapy: Secondary | ICD-10-CM | POA: Insufficient documentation

## 2022-08-14 DIAGNOSIS — D509 Iron deficiency anemia, unspecified: Secondary | ICD-10-CM | POA: Insufficient documentation

## 2022-08-14 MED ORDER — SODIUM CHLORIDE 0.9 % IV SOLN
Freq: Once | INTRAVENOUS | Status: AC
  Filled 2022-08-14: qty 250

## 2022-08-14 MED ORDER — SODIUM CHLORIDE 0.9 % IV SOLN
200.0000 mg | Freq: Once | INTRAVENOUS | Status: AC
  Administered 2022-08-14: 200 mg via INTRAVENOUS
  Filled 2022-08-14: qty 200

## 2022-08-16 MED FILL — Iron Sucrose Inj 20 MG/ML (Fe Equiv): INTRAVENOUS | Qty: 10 | Status: AC

## 2022-08-17 ENCOUNTER — Inpatient Hospital Stay: Payer: 59

## 2022-08-22 ENCOUNTER — Inpatient Hospital Stay: Payer: 59

## 2022-08-22 VITALS — BP 138/51 | HR 86 | Resp 17

## 2022-08-22 DIAGNOSIS — D509 Iron deficiency anemia, unspecified: Secondary | ICD-10-CM | POA: Diagnosis not present

## 2022-08-22 MED ORDER — SODIUM CHLORIDE 0.9 % IV SOLN
Freq: Once | INTRAVENOUS | Status: AC
  Filled 2022-08-22: qty 250

## 2022-08-22 MED ORDER — SODIUM CHLORIDE 0.9 % IV SOLN
200.0000 mg | Freq: Once | INTRAVENOUS | Status: AC
  Administered 2022-08-22: 200 mg via INTRAVENOUS
  Filled 2022-08-22: qty 200

## 2022-08-22 MED ORDER — SODIUM CHLORIDE 0.9% FLUSH
10.0000 mL | Freq: Once | INTRAVENOUS | Status: AC | PRN
  Administered 2022-08-22: 10 mL
  Filled 2022-08-22: qty 10

## 2022-08-22 NOTE — Patient Instructions (Signed)
 Iron Sucrose Injection What is this medication? IRON SUCROSE (EYE ern SOO krose) treats low levels of iron (iron deficiency anemia) in people with kidney disease. Iron is a mineral that plays an important role in making red blood cells, which carry oxygen from your lungs to the rest of your body. This medicine may be used for other purposes; ask your health care provider or pharmacist if you have questions. COMMON BRAND NAME(S): Venofer What should I tell my care team before I take this medication? They need to know if you have any of these conditions: Anemia not caused by low iron levels Heart disease High levels of iron in the blood Kidney disease Liver disease An unusual or allergic reaction to iron, other medications, foods, dyes, or preservatives Pregnant or trying to get pregnant Breastfeeding How should I use this medication? This medication is for infusion into a vein. It is given in a hospital or clinic setting. Talk to your care team about the use of this medication in children. While this medication may be prescribed for children as young as 2 years for selected conditions, precautions do apply. Overdosage: If you think you have taken too much of this medicine contact a poison control center or emergency room at once. NOTE: This medicine is only for you. Do not share this medicine with others. What if I miss a dose? Keep appointments for follow-up doses. It is important not to miss your dose. Call your care team if you are unable to keep an appointment. What may interact with this medication? Do not take this medication with any of the following: Deferoxamine Dimercaprol Other iron products This medication may also interact with the following: Chloramphenicol Deferasirox This list may not describe all possible interactions. Give your health care provider a list of all the medicines, herbs, non-prescription drugs, or dietary supplements you use. Also tell them if you smoke,  drink alcohol, or use illegal drugs. Some items may interact with your medicine. What should I watch for while using this medication? Visit your care team regularly. Tell your care team if your symptoms do not start to get better or if they get worse. You may need blood work done while you are taking this medication. You may need to follow a special diet. Talk to your care team. Foods that contain iron include: whole grains/cereals, dried fruits, beans, or peas, leafy green vegetables, and organ meats (liver, kidney). What side effects may I notice from receiving this medication? Side effects that you should report to your care team as soon as possible: Allergic reactions--skin rash, itching, hives, swelling of the face, lips, tongue, or throat Low blood pressure--dizziness, feeling faint or lightheaded, blurry vision Shortness of breath Side effects that usually do not require medical attention (report to your care team if they continue or are bothersome): Flushing Headache Joint pain Muscle pain Nausea Pain, redness, or irritation at injection site This list may not describe all possible side effects. Call your doctor for medical advice about side effects. You may report side effects to FDA at 1-800-FDA-1088. Where should I keep my medication? This medication is given in a hospital or clinic. It will not be stored at home. NOTE: This sheet is a summary. It may not cover all possible information. If you have questions about this medicine, talk to your doctor, pharmacist, or health care provider.  2024 Elsevier/Gold Standard (2022-06-02 00:00:00)

## 2022-08-22 NOTE — Progress Notes (Signed)
Patient tolerated Venofer infusion well. Explained recommendation of 30 min post monitoring. Patient refused to wait post monitoring. Educated on what signs to watch for & to call with any concerns. No questions, discharged. Stable  

## 2022-09-14 ENCOUNTER — Encounter
Admission: EM | Disposition: A | Discharge status: Home / Self Care | Payer: Self-pay | Source: Home / Self Care | Attending: Emergency Medicine

## 2022-09-14 ENCOUNTER — Emergency Department: Payer: 59

## 2022-09-14 ENCOUNTER — Encounter: Payer: Self-pay | Admitting: Emergency Medicine

## 2022-09-14 ENCOUNTER — Observation Stay: Payer: 59 | Admitting: Certified Registered"

## 2022-09-14 ENCOUNTER — Observation Stay
Admission: EM | Admit: 2022-09-14 | Discharge: 2022-09-15 | Disposition: A | Discharge status: Home / Self Care | Payer: 59 | Attending: General Surgery | Admitting: General Surgery

## 2022-09-14 ENCOUNTER — Other Ambulatory Visit: Payer: Self-pay

## 2022-09-14 DIAGNOSIS — I5022 Chronic systolic (congestive) heart failure: Secondary | ICD-10-CM | POA: Diagnosis not present

## 2022-09-14 DIAGNOSIS — K810 Acute cholecystitis: Principal | ICD-10-CM | POA: Diagnosis present

## 2022-09-14 DIAGNOSIS — Z79899 Other long term (current) drug therapy: Secondary | ICD-10-CM | POA: Insufficient documentation

## 2022-09-14 DIAGNOSIS — I110 Hypertensive heart disease with heart failure: Secondary | ICD-10-CM | POA: Diagnosis not present

## 2022-09-14 DIAGNOSIS — I2510 Atherosclerotic heart disease of native coronary artery without angina pectoris: Secondary | ICD-10-CM | POA: Diagnosis not present

## 2022-09-14 DIAGNOSIS — Z87891 Personal history of nicotine dependence: Secondary | ICD-10-CM | POA: Insufficient documentation

## 2022-09-14 DIAGNOSIS — K8012 Calculus of gallbladder with acute and chronic cholecystitis without obstruction: Principal | ICD-10-CM | POA: Insufficient documentation

## 2022-09-14 DIAGNOSIS — J449 Chronic obstructive pulmonary disease, unspecified: Secondary | ICD-10-CM | POA: Insufficient documentation

## 2022-09-14 DIAGNOSIS — R1011 Right upper quadrant pain: Secondary | ICD-10-CM | POA: Diagnosis present

## 2022-09-14 HISTORY — PX: CHOLECYSTECTOMY: SHX55

## 2022-09-14 HISTORY — PX: ROBOTIC ASSISTED LAPAROSCOPIC CHOLECYSTECTOMY: SHX6521

## 2022-09-14 LAB — CBC
HCT: 39.3 % (ref 36.0–46.0)
Hemoglobin: 13 g/dL (ref 12.0–15.0)
MCH: 28.1 pg (ref 26.0–34.0)
MCHC: 33.1 g/dL (ref 30.0–36.0)
MCV: 84.9 fL (ref 80.0–100.0)
Platelets: 310 10*3/uL (ref 150–400)
RBC: 4.63 MIL/uL (ref 3.87–5.11)
RDW: 14.2 % (ref 11.5–15.5)
WBC: 11.3 10*3/uL — ABNORMAL HIGH (ref 4.0–10.5)
nRBC: 0 % (ref 0.0–0.2)

## 2022-09-14 LAB — LIPASE, BLOOD: Lipase: 34 U/L (ref 11–51)

## 2022-09-14 LAB — COMPREHENSIVE METABOLIC PANEL
ALT: 14 U/L (ref 0–44)
AST: 21 U/L (ref 15–41)
Albumin: 4.3 g/dL (ref 3.5–5.0)
Alkaline Phosphatase: 61 U/L (ref 38–126)
Anion gap: 12 (ref 5–15)
BUN: 18 mg/dL (ref 8–23)
CO2: 23 mmol/L (ref 22–32)
Calcium: 9 mg/dL (ref 8.9–10.3)
Chloride: 93 mmol/L — ABNORMAL LOW (ref 98–111)
Creatinine, Ser: 0.76 mg/dL (ref 0.44–1.00)
GFR, Estimated: >60 mL/min (ref >60)
Glucose, Bld: 154 mg/dL — ABNORMAL HIGH (ref 70–99)
Potassium: 3.9 mmol/L (ref 3.5–5.1)
Sodium: 128 mmol/L — ABNORMAL LOW (ref 135–145)
Total Bilirubin: 0.9 mg/dL (ref 0.3–1.2)
Total Protein: 7.1 g/dL (ref 6.5–8.1)

## 2022-09-14 LAB — SURGICAL PCR SCREEN
MRSA, PCR: NEGATIVE
Staphylococcus aureus: POSITIVE — AB

## 2022-09-14 SURGERY — CHOLECYSTECTOMY, ROBOT-ASSISTED, LAPAROSCOPIC
Anesthesia: General | Site: Abdomen

## 2022-09-14 MED ORDER — LIDOCAINE HCL (PF) 2 % IJ SOLN
INTRAMUSCULAR | Status: AC
  Filled 2022-09-14: qty 5

## 2022-09-14 MED ORDER — SUGAMMADEX SODIUM 200 MG/2ML IV SOLN
INTRAVENOUS | Status: DC | PRN
  Administered 2022-09-14: 100 mg via INTRAVENOUS

## 2022-09-14 MED ORDER — ALBUTEROL SULFATE (2.5 MG/3ML) 0.083% IN NEBU: 2.5000 mg | INHALATION_SOLUTION | Freq: Four times a day (QID) | RESPIRATORY_TRACT | Status: DC | PRN

## 2022-09-14 MED ORDER — MORPHINE SULFATE (PF) 4 MG/ML IV SOLN
4.0000 mg | Freq: Once | INTRAVENOUS | Status: AC
  Administered 2022-09-14: 4 mg via INTRAVENOUS
  Filled 2022-09-14: qty 1

## 2022-09-14 MED ORDER — INDOCYANINE GREEN 25 MG IV SOLR
1.2500 mg | Freq: Once | INTRAVENOUS | Status: AC
  Administered 2022-09-14: 1.25 mg via INTRAVENOUS

## 2022-09-14 MED ORDER — SODIUM CHLORIDE 0.9 % IV SOLN
1.0000 g | INTRAVENOUS | Status: AC
  Administered 2022-09-14: 1 g via INTRAVENOUS
  Filled 2022-09-14: qty 10

## 2022-09-14 MED ORDER — METOPROLOL SUCCINATE ER 25 MG PO TB24
25.0000 mg | ORAL_TABLET | Freq: Every day | ORAL | Status: DC
  Administered 2022-09-14 – 2022-09-15 (×2): 25 mg via ORAL
  Filled 2022-09-14 (×2): qty 1

## 2022-09-14 MED ORDER — ONDANSETRON 4 MG PO TBDP: 4.0000 mg | ORAL_TABLET | Freq: Four times a day (QID) | ORAL | Status: DC | PRN

## 2022-09-14 MED ORDER — VASOPRESSIN 20 UNIT/ML IV SOLN
INTRAVENOUS | Status: DC | PRN
  Administered 2022-09-14: 1 [IU] via INTRAVENOUS
  Administered 2022-09-14: .5 [IU] via INTRAVENOUS

## 2022-09-14 MED ORDER — ACETAMINOPHEN 10 MG/ML IV SOLN
INTRAVENOUS | Status: DC | PRN
  Administered 2022-09-14: 750 mg via INTRAVENOUS

## 2022-09-14 MED ORDER — MIDAZOLAM HCL 2 MG/2ML IJ SOLN
INTRAMUSCULAR | Status: AC
  Filled 2022-09-14: qty 2

## 2022-09-14 MED ORDER — FLUTICASONE PROPIONATE 50 MCG/ACT NA SUSP: 2.0000 | Freq: Every day | NASAL | Status: DC | PRN

## 2022-09-14 MED ORDER — DEXAMETHASONE SODIUM PHOSPHATE 10 MG/ML IJ SOLN
INTRAMUSCULAR | Status: DC | PRN
  Administered 2022-09-14: 5 mg via INTRAVENOUS

## 2022-09-14 MED ORDER — LISINOPRIL 2.5 MG PO TABS
2.5000 mg | ORAL_TABLET | Freq: Every day | ORAL | Status: DC
  Administered 2022-09-14 – 2022-09-15 (×2): 2.5 mg via ORAL
  Filled 2022-09-14 (×2): qty 1

## 2022-09-14 MED ORDER — OXYCODONE HCL 5 MG/5ML PO SOLN: 5.0000 mg | Freq: Once | ORAL | Status: DC | PRN

## 2022-09-14 MED ORDER — ONDANSETRON HCL 4 MG/2ML IJ SOLN
4.0000 mg | Freq: Four times a day (QID) | INTRAMUSCULAR | Status: DC | PRN
  Administered 2022-09-14: 4 mg via INTRAVENOUS
  Filled 2022-09-14: qty 2

## 2022-09-14 MED ORDER — PIPERACILLIN-TAZOBACTAM 3.375 G IVPB
3.3750 g | Freq: Three times a day (TID) | INTRAVENOUS | Status: DC
  Administered 2022-09-14 – 2022-09-15 (×3): 3.375 g via INTRAVENOUS
  Filled 2022-09-14 (×3): qty 50

## 2022-09-14 MED ORDER — GABAPENTIN 300 MG PO CAPS
300.0000 mg | ORAL_CAPSULE | Freq: Every day | ORAL | Status: DC
  Administered 2022-09-14: 300 mg via ORAL
  Filled 2022-09-14 (×2): qty 1

## 2022-09-14 MED ORDER — PHENYLEPHRINE HCL-NACL 20-0.9 MG/250ML-% IV SOLN
INTRAVENOUS | Status: AC
  Filled 2022-09-14: qty 250

## 2022-09-14 MED ORDER — ALBUTEROL SULFATE HFA 108 (90 BASE) MCG/ACT IN AERS: 2.0000 | INHALATION_SPRAY | RESPIRATORY_TRACT | Status: DC | PRN

## 2022-09-14 MED ORDER — BUPIVACAINE HCL (PF) 0.25 % IJ SOLN
INTRAMUSCULAR | Status: AC
  Filled 2022-09-14: qty 30

## 2022-09-14 MED ORDER — ACETAMINOPHEN 10 MG/ML IV SOLN
INTRAVENOUS | Status: AC
  Filled 2022-09-14: qty 100

## 2022-09-14 MED ORDER — ALPRAZOLAM 0.25 MG PO TABS: 0.2500 mg | ORAL_TABLET | Freq: Every evening | ORAL | Status: DC | PRN

## 2022-09-14 MED ORDER — FENTANYL CITRATE (PF) 100 MCG/2ML IJ SOLN
INTRAMUSCULAR | Status: DC | PRN
  Administered 2022-09-14 (×4): 25 ug via INTRAVENOUS

## 2022-09-14 MED ORDER — METRONIDAZOLE 500 MG/100ML IV SOLN
500.0000 mg | Freq: Once | INTRAVENOUS | Status: AC
  Administered 2022-09-14: 500 mg via INTRAVENOUS
  Filled 2022-09-14: qty 100

## 2022-09-14 MED ORDER — ROCURONIUM BROMIDE 100 MG/10ML IV SOLN
INTRAVENOUS | Status: DC | PRN
  Administered 2022-09-14: 60 mg via INTRAVENOUS

## 2022-09-14 MED ORDER — DEXAMETHASONE SODIUM PHOSPHATE 10 MG/ML IJ SOLN
INTRAMUSCULAR | Status: AC
  Filled 2022-09-14: qty 1

## 2022-09-14 MED ORDER — 0.9 % SODIUM CHLORIDE (POUR BTL) OPTIME
TOPICAL | Status: DC | PRN
  Administered 2022-09-14: 500 mL

## 2022-09-14 MED ORDER — TRAMADOL HCL 50 MG PO TABS: 50.0000 mg | ORAL_TABLET | Freq: Four times a day (QID) | ORAL | Status: DC | PRN

## 2022-09-14 MED ORDER — FUROSEMIDE 20 MG PO TABS
20.0000 mg | ORAL_TABLET | Freq: Every day | ORAL | Status: DC
  Administered 2022-09-14 – 2022-09-15 (×2): 20 mg via ORAL
  Filled 2022-09-14 (×2): qty 1

## 2022-09-14 MED ORDER — FENTANYL CITRATE (PF) 100 MCG/2ML IJ SOLN
INTRAMUSCULAR | Status: AC
  Filled 2022-09-14: qty 2

## 2022-09-14 MED ORDER — ENOXAPARIN SODIUM 40 MG/0.4ML IJ SOSY
40.0000 mg | PREFILLED_SYRINGE | INTRAMUSCULAR | Status: DC
  Administered 2022-09-14 – 2022-09-15 (×2): 40 mg via SUBCUTANEOUS
  Filled 2022-09-14 (×2): qty 0.4

## 2022-09-14 MED ORDER — TIOTROPIUM BROMIDE MONOHYDRATE 18 MCG IN CAPS
1.0000 | ORAL_CAPSULE | Freq: Every day | RESPIRATORY_TRACT | Status: DC
  Administered 2022-09-14 – 2022-09-15 (×2): 18 ug via RESPIRATORY_TRACT
  Filled 2022-09-14 (×2): qty 5

## 2022-09-14 MED ORDER — BUPIVACAINE-EPINEPHRINE 0.25% -1:200000 IJ SOLN
INTRAMUSCULAR | Status: DC | PRN
  Administered 2022-09-14: 20 mL

## 2022-09-14 MED ORDER — SODIUM CHLORIDE 0.9 % IV SOLN: Freq: Once | INTRAVENOUS | Status: AC

## 2022-09-14 MED ORDER — SODIUM CHLORIDE 0.9 % IV SOLN: INTRAVENOUS | Status: DC

## 2022-09-14 MED ORDER — PROPOFOL 10 MG/ML IV BOLUS
INTRAVENOUS | Status: DC | PRN
  Administered 2022-09-14: 150 mg via INTRAVENOUS

## 2022-09-14 MED ORDER — INDOCYANINE GREEN 25 MG IV SOLR
INTRAVENOUS | Status: AC
  Filled 2022-09-14: qty 10

## 2022-09-14 MED ORDER — FENTANYL CITRATE (PF) 100 MCG/2ML IJ SOLN: 25.0000 ug | INTRAMUSCULAR | Status: DC | PRN

## 2022-09-14 MED ORDER — ROCURONIUM BROMIDE 10 MG/ML (PF) SYRINGE
PREFILLED_SYRINGE | INTRAVENOUS | Status: AC
  Filled 2022-09-14: qty 10

## 2022-09-14 MED ORDER — EPHEDRINE SULFATE (PRESSORS) 50 MG/ML IJ SOLN
INTRAMUSCULAR | Status: DC | PRN
  Administered 2022-09-14: 10 mg via INTRAVENOUS

## 2022-09-14 MED ORDER — PHENYLEPHRINE HCL (PRESSORS) 10 MG/ML IV SOLN
INTRAVENOUS | Status: DC | PRN
  Administered 2022-09-14: 160 ug via INTRAVENOUS
  Administered 2022-09-14: 80 ug via INTRAVENOUS
  Administered 2022-09-14: 160 ug via INTRAVENOUS
  Administered 2022-09-14 (×2): 80 ug via INTRAVENOUS

## 2022-09-14 MED ORDER — ONDANSETRON HCL 4 MG/2ML IJ SOLN
INTRAMUSCULAR | Status: AC
  Filled 2022-09-14: qty 2

## 2022-09-14 MED ORDER — MOMETASONE FURO-FORMOTEROL FUM 200-5 MCG/ACT IN AERO
2.0000 | INHALATION_SPRAY | Freq: Two times a day (BID) | RESPIRATORY_TRACT | Status: DC
  Filled 2022-09-14 (×2): qty 8.8

## 2022-09-14 MED ORDER — MUPIROCIN 2 % EX OINT
1.0000 | TOPICAL_OINTMENT | Freq: Two times a day (BID) | CUTANEOUS | Status: DC
  Filled 2022-09-14: qty 22

## 2022-09-14 MED ORDER — LIDOCAINE HCL (CARDIAC) PF 100 MG/5ML IV SOSY
PREFILLED_SYRINGE | INTRAVENOUS | Status: DC | PRN
  Administered 2022-09-14: 60 mg via INTRAVENOUS

## 2022-09-14 MED ORDER — PROPOFOL 10 MG/ML IV BOLUS
INTRAVENOUS | Status: AC
  Filled 2022-09-14: qty 20

## 2022-09-14 MED ORDER — ESCITALOPRAM OXALATE 10 MG PO TABS
10.0000 mg | ORAL_TABLET | Freq: Every day | ORAL | Status: DC
  Administered 2022-09-14 – 2022-09-15 (×2): 10 mg via ORAL
  Filled 2022-09-14 (×2): qty 1

## 2022-09-14 MED ORDER — ONDANSETRON HCL 4 MG/2ML IJ SOLN
INTRAMUSCULAR | Status: DC | PRN
  Administered 2022-09-14: 4 mg via INTRAVENOUS

## 2022-09-14 MED ORDER — OXYCODONE HCL 5 MG PO TABS: 5.0000 mg | ORAL_TABLET | Freq: Once | ORAL | Status: DC | PRN

## 2022-09-14 MED ORDER — SODIUM CHLORIDE 0.9 % IV BOLUS
500.0000 mL | Freq: Once | INTRAVENOUS | Status: AC
  Administered 2022-09-14: 500 mL via INTRAVENOUS

## 2022-09-14 MED ORDER — PIPERACILLIN-TAZOBACTAM 3.375 G IVPB
INTRAVENOUS | Status: AC
  Filled 2022-09-14: qty 50

## 2022-09-14 MED ORDER — EPINEPHRINE PF 1 MG/ML IJ SOLN
INTRAMUSCULAR | Status: AC
  Filled 2022-09-14: qty 1

## 2022-09-14 MED ORDER — MORPHINE SULFATE (PF) 4 MG/ML IV SOLN
4.0000 mg | INTRAVENOUS | Status: DC | PRN
  Administered 2022-09-14: 4 mg via INTRAVENOUS
  Filled 2022-09-14: qty 1

## 2022-09-14 MED ORDER — ONDANSETRON HCL 4 MG/2ML IJ SOLN
4.0000 mg | INTRAMUSCULAR | Status: AC
  Administered 2022-09-14: 4 mg via INTRAVENOUS
  Filled 2022-09-14: qty 2

## 2022-09-14 SURGICAL SUPPLY — 51 items
ADH SKN CLS APL DERMABOND .7 (GAUZE/BANDAGES/DRESSINGS) ×1
BAG PRESSURE INF REUSE 1000 (BAG) IMPLANT
CANNULA REDUCER 12-8 DVNC XI (CANNULA) ×1 IMPLANT
CATH REDDICK CHOLANGI 4FR 50CM (CATHETERS) IMPLANT
CAUTERY HOOK MNPLR 1.6 DVNC XI (INSTRUMENTS) ×1 IMPLANT
CLIP LIGATING HEM O LOK PURPLE (MISCELLANEOUS) IMPLANT
CLIP LIGATING HEMO O LOK GREEN (MISCELLANEOUS) ×1 IMPLANT
DERMABOND ADVANCED .7 DNX12 (GAUZE/BANDAGES/DRESSINGS) ×1 IMPLANT
DRAPE ARM DVNC X/XI (DISPOSABLE) ×4 IMPLANT
DRAPE C-ARM XRAY 36X54 (DRAPES) IMPLANT
DRAPE COLUMN DVNC XI (DISPOSABLE) ×1 IMPLANT
ELECT REM PT RETURN 9FT ADLT (ELECTROSURGICAL) ×1
ELECTRODE REM PT RTRN 9FT ADLT (ELECTROSURGICAL) ×1 IMPLANT
FORCEPS BPLR 8 MD DVNC XI (FORCEP) ×1 IMPLANT
FORCEPS BPLR R/ABLATION 8 DVNC (INSTRUMENTS) ×1 IMPLANT
FORCEPS PROGRASP DVNC XI (FORCEP) ×1 IMPLANT
GLOVE BIO SURGEON STRL SZ 6.5 (GLOVE) ×2 IMPLANT
GLOVE BIOGEL PI IND STRL 6.5 (GLOVE) ×2 IMPLANT
GOWN STRL REUS W/ TWL LRG LVL3 (GOWN DISPOSABLE) ×3 IMPLANT
GOWN STRL REUS W/TWL LRG LVL3 (GOWN DISPOSABLE) ×3
GRASPER SUT TROCAR 14GX15 (MISCELLANEOUS) ×1 IMPLANT
IRRIGATOR SUCT 8 DISP DVNC XI (IRRIGATION / IRRIGATOR) IMPLANT
IV CATH ANGIO 12GX3 LT BLUE (NEEDLE) IMPLANT
IV NS 1000ML (IV SOLUTION) ×1
IV NS 1000ML BAXH (IV SOLUTION) IMPLANT
KIT PINK PAD W/HEAD ARE REST (MISCELLANEOUS) ×1 IMPLANT
KIT PINK PAD W/HEAD ARM REST (MISCELLANEOUS) ×1 IMPLANT
LABEL OR SOLS (LABEL) ×1 IMPLANT
MANIFOLD NEPTUNE II (INSTRUMENTS) ×1 IMPLANT
NDL HYPO 22X1.5 SAFETY MO (MISCELLANEOUS) ×1 IMPLANT
NDL INSUFFLATION 14GA 120MM (NEEDLE) ×1 IMPLANT
NEEDLE HYPO 22X1.5 SAFETY MO (MISCELLANEOUS) ×1 IMPLANT
NEEDLE INSUFFLATION 14GA 120MM (NEEDLE) ×1 IMPLANT
NS IRRIG 500ML POUR BTL (IV SOLUTION) ×1 IMPLANT
OBTURATOR OPTICAL STND 8 DVNC (TROCAR) ×1
OBTURATOR OPTICALSTD 8 DVNC (TROCAR) ×1 IMPLANT
PACK LAP CHOLECYSTECTOMY (MISCELLANEOUS) ×1 IMPLANT
SEAL UNIV 5-12 XI (MISCELLANEOUS) ×4 IMPLANT
SET TUBE SMOKE EVAC HIGH FLOW (TUBING) ×1 IMPLANT
SOL ELECTROSURG ANTI STICK (MISCELLANEOUS) ×1
SOLUTION ELECTROSURG ANTI STCK (MISCELLANEOUS) ×1 IMPLANT
SPIKE FLUID TRANSFER (MISCELLANEOUS) ×2 IMPLANT
SPONGE T-LAP 4X18 ~~LOC~~+RFID (SPONGE) IMPLANT
SUT MNCRL 4-0 (SUTURE) ×1
SUT MNCRL 4-0 27XMFL (SUTURE) ×1
SUT VICRYL 0 UR6 27IN ABS (SUTURE) ×1 IMPLANT
SUTURE MNCRL 4-0 27XMF (SUTURE) ×1 IMPLANT
SYS BAG RETRIEVAL 10MM (BASKET) ×1
SYSTEM BAG RETRIEVAL 10MM (BASKET) ×1 IMPLANT
TRAP FLUID SMOKE EVACUATOR (MISCELLANEOUS) ×1 IMPLANT
WATER STERILE IRR 500ML POUR (IV SOLUTION) ×1 IMPLANT

## 2022-09-14 NOTE — ED Notes (Signed)
ED Provider at bedside. 

## 2022-09-14 NOTE — H&P (Signed)
SURGICAL CONSULTATION NOTE   HISTORY OF PRESENT ILLNESS (HPI):  82 y.o. female presented to [facility] ED for evaluation of abdominal pain. Patient reports right upper quadrant pain since 2 days ago.  She endorsed that the pain is localized to the right upper quadrant.  No perforation.  Patient cannot identify any alleviating or aggravating factors.  Patient endorsed that the pain has been intensifying.  Denies any chest pain or shortness of breath.  At the ED she was found with tenderness in the right upper quadrant.  Labs shows, leukocytosis.  Normal liver enzymes.  She had an ultrasound of the abdomen that shows cholelithiasis with gallbladder wall thickening and pericholecystic fluid.  There was positive Murphy sign as per ultrasound technician.  I personally evaluated the images.  There is normal bilirubin and liver enzymes.  Surgery is consulted by Dr. York Cerise in this context for evaluation and management of acute cholecystitis.  PAST MEDICAL HISTORY (PMH):  Past Medical History:  Diagnosis Date   Allergic rhinitis    Anxiety    Centrilobular emphysema (HCC)    Chronic systolic CHF (congestive heart failure) (HCC)    Colon polyposis    COPD (chronic obstructive pulmonary disease) (HCC)    Coronary artery disease involving native coronary artery of native heart without angina pectoris    DDD (degenerative disc disease), lumbar    Dysrhythmia    Essential hypertension    GERD (gastroesophageal reflux disease)    HLD (hyperlipidemia)    Hypertension    IDA (iron deficiency anemia)    Low serum vitamin D    Lumbar radiculitis    Lumbar spondylitis (HCC)    Lumbar stenosis with neurogenic claudication    Melena    PUD (peptic ulcer disease)    Restless leg    Rotator cuff rupture    VHD (valvular heart disease)      PAST SURGICAL HISTORY (PSH):  Past Surgical History:  Procedure Laterality Date   ABDOMINAL HYSTERECTOMY     APPENDECTOMY     BREAST BIOPSY Right 11/20/2011   neg  [place] core   BREAST BIOPSY Right 07/21/2022   [place] Bx, Venus clip, path pending   BREAST BIOPSY Right 07/21/2022   [place] BX, Heart Clip, path pending   BREAST BIOPSY Right 07/21/2022   [place] RT BREAST BX W LOC DEV EA ADD LESION IMG BX SPEC US GUIDE 07/21/2022 [facility]   BREAST BIOPSY Right 07/21/2022   [place] RT BREAST BX W LOC DEV 1ST LESION IMG BX SPEC US GUIDE 07/21/2022 [facility]   COLECTOMY     COLONOSCOPY WITH PROPOFOL N/A 12/08/2019   Procedure: COLONOSCOPY WITH PROPOFOL;  Surgeon: Regis Bill, MD;  Location: [facility];  Service: Endoscopy;  Laterality: N/A;   CORONARY ANGIOPLASTY WITH STENT PLACEMENT     ESOPHAGOGASTRODUODENOSCOPY (EGD) WITH PROPOFOL N/A 12/08/2019   Procedure: ESOPHAGOGASTRODUODENOSCOPY (EGD) WITH PROPOFOL;  Surgeon: Regis Bill, MD;  Location: [facility];  Service: Endoscopy;  Laterality: N/A;   TONSILLECTOMY       MEDICATIONS:  Prior to Admission medications   Medication Sig Start Date End Date Taking? Authorizing Provider  pantoprazole (PROTONIX) 40 MG tablet Take 1 tablet by mouth daily. 08/31/22  Yes [provider]  acetaminophen (TYLENOL) 500 MG tablet Take 500 mg by mouth every 6 (six) hours as needed for mild pain, moderate pain, fever or headache.     [provider]  albuterol (PROVENTIL HFA;VENTOLIN HFA) 108 (90 Base) MCG/ACT inhaler Inhale 2 puffs into the  lungs every 4 (four) hours as needed for wheezing or shortness of breath.    [provider]  albuterol (PROVENTIL) (2.5 MG/3ML) 0.083% nebulizer solution Take 2.5 mg by nebulization every 6 (six) hours as needed for wheezing or shortness of breath.    [provider]  ALPRAZolam Prudy Feeler) 0.5 MG tablet Take 0.5 mg by mouth daily as needed.    [provider]  budesonide-formoterol (SYMBICORT) 160-4.5 MCG/ACT inhaler Inhale 2 puffs into the lungs 2 (two) times daily.    [provider]  calcium citrate-vitamin D (CITRACAL+D)  315-200 MG-UNIT tablet Take 1 tablet by mouth daily.    [provider]  cyanocobalamin 1000 MCG tablet Take 1,000 mcg by mouth daily.    [provider]  escitalopram (LEXAPRO) 10 MG tablet Take 10 mg by mouth daily. 03/22/15   [provider]  fluticasone (FLONASE) 50 MCG/ACT nasal spray Place 2 sprays into both nostrils daily.    [provider]  furosemide (LASIX) 20 MG tablet Take 20 mg by mouth daily.    [provider]  gabapentin (NEURONTIN) 300 MG capsule Take 300 mg by mouth daily. 10/14/20   [provider]  lisinopril (PRINIVIL,ZESTRIL) 2.5 MG tablet Take 2.5 mg by mouth daily.    [provider]  metoprolol succinate (TOPROL-XL) 25 MG 24 hr tablet Take 25 mg by mouth daily. 04/29/15   [provider]  SPIRIVA RESPIMAT 2.5 MCG/ACT AERS Inhale 2 puffs into the lungs daily.  03/02/15   [provider]  traMADol (ULTRAM) 50 MG tablet Take 75 mg by mouth 2 (two) times daily as needed for moderate pain.     [provider]  Vitamin D, Ergocalciferol, (DRISDOL) 50000 units CAPS capsule Take 50,000 Units by mouth every 7 (seven) days. Pt takes on Saturday.    [provider]     ALLERGIES:  Allergies  Allergen Reactions   Aspirin Other (See Comments)    GIB   Ropinirole Anxiety and Other (See Comments)    Reaction:  Severe leg pain    Ferrous Fumarate Itching   Ferrous Sulfate Diarrhea and Nausea And Vomiting   Omeprazole Nausea And Vomiting   Oxycodone Other (See Comments)    Reaction:  Makes pt hyper    Prednisone Other (See Comments)    Reaction:  Makes pt hyper      SOCIAL HISTORY:  Social History   Socioeconomic History   Marital status: Widowed    Spouse name: Not on file   Number of children: Not on file   Years of education: Not on file   Highest education level: Not on file  Occupational History   Not on file  Tobacco Use   Smoking status: Former    Current packs/day:  0.00    Average packs/day: 1.5 packs/day for 52.5 years (78.8 ttl pk-yrs)    Types: Cigarettes    Start date: 05/10/1941    Quit date: 11/09/1993    Years since quitting: 28.8   Smokeless tobacco: Never  Vaping Use   Vaping status: Never Used  Substance and Sexual Activity   Alcohol use: No    Alcohol/week: 0.0 standard drinks of alcohol   Drug use: No   Sexual activity: Not on file  Other Topics Concern   Not on file  Social History Narrative   Not on file   Social Determinants of Health   Financial Resource Strain: Low Risk  (06/23/2022)   Received from [facility], [employer]   Overall Financial Resource Strain (CARDIA)    Difficulty of Paying Living Expenses: Not hard at all  Food Insecurity: No Food Insecurity (09/14/2022)   Hunger Vital Sign    Worried About Running Out of Food in the Last Year: Never true    Ran Out of Food in the Last Year: Never true  Transportation Needs: No Transportation Needs (09/14/2022)   PRAPARE - Administrator, Civil Service (Medical): No    Lack of Transportation (Non-Medical): No  Physical Activity: Not on file  Stress: Not on file  Social Connections: Not on file  Intimate Partner Violence: Not At Risk (09/14/2022)   Humiliation, Afraid, Rape, and Kick questionnaire    Fear of Current or Ex-Partner: No    Emotionally Abused: No    Physically Abused: No    Sexually Abused: No     FAMILY HISTORY:  Family History  Problem Relation Age of Onset   Heart disease Sister    Cancer Brother        prostate cancer   Heart disease Brother      REVIEW OF SYSTEMS:  Constitutional: denies weight loss, fever, chills, or sweats  Eyes: denies any other vision changes, history of eye injury  ENT: denies sore throat, hearing problems  Respiratory: denies shortness of breath, wheezing  Cardiovascular: denies chest pain, palpitations  Gastrointestinal: positive abdominal pain, nausea and  vomitnig Genitourinary: denies burning with urination or urinary frequency Musculoskeletal: denies any other joint pains or cramps  Skin: denies any other rashes or skin discolorations  Neurological: denies any other headache, dizziness, weakness  Psychiatric: denies any other depression, anxiety   All other review of systems were negative   VITAL SIGNS:  Temp:  [98.3 F (36.8 C)-98.4 F (36.9 C)] 98.4 F (36.9 C) (09/05 0740) Pulse Rate:  [98-103] 103 (09/05 0700) Resp:  [17-18] 17 (09/05 0534) BP: (144-182)/(76-87) 158/76 (09/05 0700) SpO2:  [97 %-100 %] 97 % (09/05 0700) Weight:  [48.1 kg] 48.1 kg (09/05 0215)     Height: 5' (152.4 cm) Weight: 48.1 kg BMI (Calculated): 20.7   INTAKE/OUTPUT:  This shift: Total I/O In: 268.3 [I.V.:168.3; IV Piggyback:100] Out: -   Last 2 shifts: @IOLAST2SHIFTS @   PHYSICAL EXAM:  Constitutional:  -- Normal body habitus  -- Awake, alert, and oriented x3  Eyes:  -- Pupils equally round and reactive to light  -- No scleral icterus  Ear, nose, and throat:  -- No jugular venous distension  Pulmonary:  -- No crackles  -- Equal breath sounds bilaterally -- Breathing non-labored at rest Cardiovascular:  -- S1, S2 present  -- No pericardial rubs Gastrointestinal:  -- Abdomen soft, tender to palpation in the right upper quadrant, non-distended, no guarding or rebound tenderness -- No abdominal masses appreciated, pulsatile or otherwise  Musculoskeletal and Integumentary:  -- Wounds: None appreciated -- Extremities: B/L UE and LE FROM, hands and feet warm, no edema  Neurologic:  -- Motor function: intact and symmetric -- Sensation: intact and symmetric   Labs:     Latest Ref Rng & Units 09/14/2022    2:17 AM 07/25/2022    1:20 PM 01/19/2022   11:42 AM  CBC  WBC 4.0 - 10.5 K/uL 11.3  4.3  5.6   Hemoglobin 12.0 - 15.0 g/dL 16.1  9.7  09.6   Hematocrit 36.0 - 46.0 % 39.3  29.8  34.1   Platelets 150 - 400 K/uL 310  246  251       Latest  Ref Rng & Units 09/14/2022    2:17 AM 01/19/2022   11:42 AM 02/15/2021    2:04 PM  CMP  Glucose 70 - 99 mg/dL 161  91  096   BUN 8 - 23 mg/dL 18  20  20    Creatinine 0.44 - 1.00 mg/dL 0.45  4.09  8.11   Sodium 135 - 145 mmol/L 128  134  135   Potassium 3.5 - 5.1 mmol/L 3.9  4.1  3.8   Chloride 98 - 111 mmol/L 93  100  101   CO2 22 - 32 mmol/L 23  26  26    Calcium 8.9 - 10.3 mg/dL 9.0  8.7  9.0   Total Protein 6.5 - 8.1 g/dL 7.1  6.4  6.5   Total Bilirubin 0.3 - 1.2 mg/dL 0.9  0.2  <9.1   Alkaline Phos 38 - 126 U/L 61  62  59   AST 15 - 41 U/L 21  17  21    ALT 0 - 44 U/L 14  19  27      Imaging studies:  EXAM: ULTRASOUND ABDOMEN LIMITED RIGHT UPPER QUADRANT   COMPARISON:  None Available.   FINDINGS: Gallbladder:   There are layering stones in the gallbladder, largest is 1.3 cm. The gallbladder wall measuring 9.2 mm free wall thickness with positive sonographic Murphy's sign and pericholecystic fluid.   The findings are consistent with acute cholecystitis. Surgical consult is recommended.   Common bile duct:   Diameter: Dilated measuring 11 mm. No stone is seen in the proximal to mid duct but the distal third of the duct is obscured by bowel gas. No intrahepatic biliary prominence is seen.   Liver:   No focal lesion identified. Within normal limits in parenchymal echogenicity. Portal vein is patent on color Doppler imaging with normal direction of blood flow towards the liver.   Other: Small right pleural effusion.   IMPRESSION: 1. Cholelithiasis with gallbladder wall thickening, pericholecystic fluid and positive sonographic Murphy's sign. The findings are consistent with acute cholecystitis. Surgical consult recommended. 2. Dilated common bile duct measuring 11 mm. No stone is seen in the proximal to mid duct but the distal third of the duct is obscured by bowel gas. Laboratory and clinical correlation advised. 3. Small right pleural effusion.     Electronically  Signed   By: Almira Bar M.D.   On: 09/14/2022 05:38  Assessment/Plan:  82 y.o. female with acute cholecystitis, complicated by pertinent comorbidities including COPD, CHF.  Patient with history, physical exam and images consistent with acute cholecystitis. Patient oriented about diagnosis and surgical management as treatment.   Patient had a recent cardiovascular evaluation for cardiac clearance for excisional biopsy of the breast.  She was cleared for that surgery.  She denies any chest pain.  She denies any shortness of breath.  She use home oxygen as needed.  Discussed the risk of surgery including post-op infxn, seroma, biloma, chronic pain, poor-delayed wound healing, retained gallstone, conversion to open procedure, post-op SBO or ileus, and need for additional procedures to address said risks.  The risks of general anesthetic including MI, CVA, sudden death or even reaction to anesthetic medications also discussed. Alternatives include continued observation.  Benefits include possible symptom relief, prevention of complications including acute cholecystitis, pancreatitis.  Gae Gallop, MD

## 2022-09-14 NOTE — Progress Notes (Signed)
Yellow MEWS intervention initiated surgeon, charge RN, nurse tech and primary care RN are aware of protocol.   09/14/22 1520  Assess: MEWS Score  Temp 97.6 F (36.4 C)  BP (!) 81/40  MAP (mmHg) (!) 52  Pulse Rate 80  Resp 16  SpO2 97 %  O2 Device Nasal Cannula  O2 Flow Rate (L/min) 2 L/min  Assess: MEWS Score  MEWS Temp 0  MEWS Systolic 1  MEWS Pulse 0  MEWS RR 0  MEWS LOC 0  MEWS Score 1  MEWS Score Color Green  Assess: if the MEWS score is Yellow or Red  Were vital signs accurate and taken at a resting state? Yes  Does the patient meet 2 or more of the SIRS criteria? No  MEWS guidelines implemented  Yes, yellow  Treat  MEWS Interventions Considered administering scheduled or prn medications/treatments as ordered  Take Vital Signs  Increase Vital Sign Frequency  Yellow: Q2hr x1, continue Q4hrs until patient remains green for 12hrs  Escalate  MEWS: Escalate Yellow: Discuss with charge nurse and consider notifying provider and/or RRT  Notify: Charge Nurse/RN  Name of Charge Nurse/RN Notified [place], RN  Provider Notification  Provider Name/Title Carolan Shiver, MD  Date Provider Notified 09/14/22  Time Provider Notified 1525  Method of Notification Page  Notification Reason Change in status  Provider response No new orders  Date of Provider Response 09/14/22  Notify: Rapid Response  Name of Rapid Response RN Notified n/a  Assess: SIRS CRITERIA  SIRS Temperature  0  SIRS Pulse 0  SIRS Respirations  0  SIRS WBC 0  SIRS Score Sum  0

## 2022-09-14 NOTE — Op Note (Signed)
Preoperative diagnosis: Acute cholecystitis  Postoperative diagnosis: Same  Procedure: Robotic Assisted Laparoscopic Cholecystectomy.   Anesthesia: GETA   Surgeon: Dr. Hazle Quant  Wound Classification: Clean Contaminated  Indications: Patient is a 82 y.o. female developed right upper quadrant pain, nausea, vomiting, leukocytosis and on workup was found to have cholecystitis. Robotic Assisted Laparoscopic cholecystectomy was elected.  Findings: Severe pericholecystic edema Critical view of safety achieved Cystic duct and artery identified, ligated and divided Adequate hemostasis         Description of procedure: The patient was placed on the operating table in the supine position. General anesthesia was induced. A time-out was completed verifying correct patient, procedure, site, positioning, and implant(s) and/or special equipment prior to beginning this procedure. An orogastric tube was placed. The abdomen was prepped and draped in the usual sterile fashion.  An incision was made in a natural skin line below the umbilicus.  The fascia was elevated and the Veress needle inserted. Proper position was confirmed by aspiration and saline meniscus test.  The abdomen was insufflated with carbon dioxide to a pressure of 15 mmHg. The patient tolerated insufflation well. A 8-mm trocar was then inserted in optiview fashion.  The laparoscope was inserted and the abdomen inspected. No injuries from initial trocar placement were noted. Additional trocars were then inserted in the following locations: an 8-mm trocar in the left lateral abdomen, and another two 8-mm trocars to the right side of the abdomen 5 cm appart. The umbilical trocar was changed to a 12 mm trocar all under direct visualization. The abdomen was inspected and no abnormalities were found. The table was placed in the reverse Trendelenburg position with the right side up. The robotic arms were docked and target anatomy identified.  Instrument inserted under direct visualization.  Filmy adhesions between the gallbladder and omentum, duodenum and transverse colon were lysed with electrocautery. The dome of the gallbladder was grasped with a prograsp and retracted over the dome of the liver. The infundibulum was also grasped with an atraumatic grasper and retracted toward the right lower quadrant. This maneuver exposed Calot's triangle. The peritoneum overlying the gallbladder infundibulum was then incised and the cystic duct and cystic artery identified and circumferentially dissected. Critical view of safety reviewed before ligating any structure. Firefly images taken to visualize biliary ducts. The cystic duct and cystic artery were then doubly clipped and divided close to the gallbladder.  The gallbladder was then dissected from its peritoneal attachments by electrocautery. Hemostasis was checked and the gallbladder and contained stones were removed using an endoscopic retrieval bag. The gallbladder was passed off the table as a specimen. The gallbladder fossa was copiously irrigated with saline and hemostasis was obtained. There was no evidence of bleeding from the gallbladder fossa or cystic artery or leakage of the bile from the cystic duct stump. Secondary trocars were removed under direct vision. No bleeding was noted. The robotic arms were undoked. The scope was withdrawn and the umbilical trocar removed. The abdomen was allowed to collapse. The fascia of the 12mm trocar sites was closed with figure-of-eight 0 vicryl sutures. The skin was closed with subcuticular sutures of 4-0 monocryl and topical skin adhesive. The orogastric tube was removed.  The patient tolerated the procedure well and was taken to the postanesthesia care unit in stable condition.   Specimen: Gallbladder  Complications: None  EBL: 10 mL

## 2022-09-14 NOTE — Anesthesia Preprocedure Evaluation (Signed)
Anesthesia Evaluation  Patient identified by MRN, date of birth, ID band Patient awake    Reviewed: Allergy & Precautions, NPO status , Patient's Chart, lab work & pertinent test results  History of Anesthesia Complications Negative for: history of anesthetic complications  Airway Mallampati: III  TM Distance: <3 FB Neck ROM: full    Dental  (+) Missing   Pulmonary shortness of breath, COPD,  COPD inhaler and oxygen dependent, former smoker   Pulmonary exam normal        Cardiovascular hypertension, + CAD, + Cardiac Stents and +CHF  (-) Past MI Normal cardiovascular exam+ dysrhythmias      Neuro/Psych  Neuromuscular disease  negative psych ROS   GI/Hepatic Neg liver ROS, PUD,GERD  Controlled,,  Endo/Other  negative endocrine ROS    Renal/GU      Musculoskeletal   Abdominal   Peds  Hematology negative hematology ROS (+)   Anesthesia Other Findings Past Medical History: No date: Allergic rhinitis No date: Anxiety No date: Centrilobular emphysema (HCC) No date: Chronic systolic CHF (congestive heart failure) (HCC) No date: Colon polyposis No date: COPD (chronic obstructive pulmonary disease) (HCC) No date: Coronary artery disease involving native coronary artery of  native heart without angina pectoris No date: DDD (degenerative disc disease), lumbar No date: Dysrhythmia No date: Essential hypertension No date: GERD (gastroesophageal reflux disease) No date: HLD (hyperlipidemia) No date: Hypertension No date: IDA (iron deficiency anemia) No date: Low serum vitamin D No date: Lumbar radiculitis No date: Lumbar spondylitis (HCC) No date: Lumbar stenosis with neurogenic claudication No date: Melena No date: PUD (peptic ulcer disease) No date: Restless leg No date: Rotator cuff rupture No date: VHD (valvular heart disease)  Past Surgical History: No date: ABDOMINAL HYSTERECTOMY No date:  APPENDECTOMY 11/20/2011: BREAST BIOPSY; Right     Comment:  neg [place] core 07/21/2022: BREAST BIOPSY; Right     Comment:  [place] Bx, Venus clip, path pending 07/21/2022: BREAST BIOPSY; Right     Comment:  [place] BX, Heart Clip, path pending 07/21/2022: BREAST BIOPSY; Right     Comment:  [place] RT BREAST BX W LOC DEV EA ADD LESION IMG BX SPEC [place]               GUIDE 07/21/2022 [facility] 07/21/2022: BREAST BIOPSY; Right     Comment:  [place] RT BREAST BX W LOC DEV 1ST LESION IMG BX SPEC [place]               GUIDE 07/21/2022 [facility] No date: COLECTOMY 12/08/2019: COLONOSCOPY WITH PROPOFOL; N/A     Comment:  Procedure: COLONOSCOPY WITH PROPOFOL;  Surgeon:               Regis Bill, MD;  Location: [facility];                Service: Endoscopy;  Laterality: N/A; No date: CORONARY ANGIOPLASTY WITH STENT PLACEMENT 12/08/2019: ESOPHAGOGASTRODUODENOSCOPY (EGD) WITH PROPOFOL; N/A     Comment:  Procedure: ESOPHAGOGASTRODUODENOSCOPY (EGD) WITH               PROPOFOL;  Surgeon: Regis Bill, MD;  Location:               [facility];  Service: Endoscopy;  Laterality: N/A; No date: TONSILLECTOMY  BMI    Body Mass Index: 20.70 kg/m      Reproductive/Obstetrics negative OB ROS  Anesthesia Physical Anesthesia Plan  ASA: 4  Anesthesia Plan: General ETT   Post-op Pain Management:    Induction: Intravenous  PONV Risk Score and Plan: Ondansetron, Dexamethasone, Midazolam and Treatment may vary due to age or medical condition  Airway Management Planned: Oral ETT  Additional Equipment:   Intra-op Plan:   Post-operative Plan: Extubation in OR and Possible Post-op intubation/ventilation  Informed Consent: I have reviewed the patients History and Physical, chart, labs and discussed the procedure including the risks, benefits and alternatives for the proposed anesthesia with the patient or authorized representative who has indicated  his/her understanding and acceptance.     Dental Advisory Given  Plan Discussed with: Anesthesiologist, CRNA and Surgeon  Anesthesia Plan Comments: (Patient consented for risks of anesthesia including but not limited to:  - adverse reactions to medications - damage to eyes, teeth, lips or other oral mucosa - nerve damage due to positioning  - sore throat or hoarseness - Damage to heart, brain, nerves, lungs, other parts of body or loss of life  Patient voiced understanding.)       Anesthesia Quick Evaluation

## 2022-09-14 NOTE — Anesthesia Procedure Notes (Signed)
Procedure Name: Intubation Date/Time: 09/14/2022 1:20 PM  Performed by: Malva Cogan, CRNAPre-anesthesia Checklist: Patient identified, Patient being monitored, Emergency Drugs available and Suction available Patient Re-evaluated:Patient Re-evaluated prior to induction Oxygen Delivery Method: Circle system utilized Preoxygenation: Pre-oxygenation with 100% oxygen Induction Type: IV induction Ventilation: Mask ventilation without difficulty and Oral airway inserted - appropriate to patient size Laryngoscope Size: Mac and 3 Grade View: Grade I Tube type: Oral Tube size: 7.0 mm Number of attempts: 1 Airway Equipment and Method: Stylet Placement Confirmation: ETT inserted through vocal cords under direct vision, positive ETCO2, breath sounds checked- equal and bilateral and CO2 detector Secured at: 21 cm Tube secured with: Tape Dental Injury: Teeth and Oropharynx as per pre-operative assessment

## 2022-09-14 NOTE — ED Notes (Signed)
Pt dressed out in hospital gown and socks. All personal belongings placed in pt belonging bag.

## 2022-09-14 NOTE — Plan of Care (Signed)

## 2022-09-14 NOTE — Anesthesia Postprocedure Evaluation (Signed)
Anesthesia Post Note  Patient: Tanya Rhodes  Procedure(s) Performed: XI ROBOTIC ASSISTED LAPAROSCOPIC CHOLECYSTECTOMY (Abdomen) INDOCYANINE GREEN FLUORESCENCE IMAGING (ICG) (Abdomen)  Patient location during evaluation: PACU Anesthesia Type: General Level of consciousness: awake and alert Pain management: pain level controlled Vital Signs Assessment: post-procedure vital signs reviewed and stable Respiratory status: spontaneous breathing, nonlabored ventilation, respiratory function stable and patient connected to nasal cannula oxygen Cardiovascular status: blood pressure returned to baseline and stable Postop Assessment: no apparent nausea or vomiting Anesthetic complications: no  No notable events documented.   Last Vitals:  Vitals:   09/14/22 1445 09/14/22 1500  BP: (!) 121/51   Pulse: 88 89  Resp: 17 15  Temp: 37.2 C 37.2 C  SpO2: 96% 97%    Last Pain:  Vitals:   09/14/22 1445  TempSrc:   PainSc: 0-No pain                 Stephanie Coup

## 2022-09-14 NOTE — Progress Notes (Signed)
Surgeon notified about the patient's vital after gallbladder removal.   09/14/22 1520 09/14/22 1522  Vitals  Temp 97.6 F (36.4 C)  --   Temp Source Oral  --   BP (!) 81/40 (!) 89/42  MAP (mmHg) (!) 52 (!) 58  BP Location Right Arm Left Arm  BP Method Automatic Automatic  Patient Position (if appropriate) Lying Lying  Pulse Rate 80 81  Pulse Rate Source Monitor Monitor  Resp 16  --   MEWS COLOR  MEWS Score Color Green Green  Oxygen Therapy  SpO2 97 % 96 %  O2 Device Nasal Cannula  --   O2 Flow Rate (L/min) 2 L/min  --

## 2022-09-14 NOTE — ED Triage Notes (Signed)
Pt presents ambulatory to triage via POV with complaints of lower abdominal pain x 2 days with associated N/V. She notes trying OTC meds without any improvement in her sx. Rates her pain 10/10. A&Ox4 at this time. Denies CP or SOB.    Of note, hx of COPD wears 2L chronically.

## 2022-09-14 NOTE — Transfer of Care (Signed)
Immediate Anesthesia Transfer of Care Note  Patient: Tanya Rhodes  Procedure(s) Performed: XI ROBOTIC ASSISTED LAPAROSCOPIC CHOLECYSTECTOMY (Abdomen) INDOCYANINE GREEN FLUORESCENCE IMAGING (ICG) (Abdomen)  Patient Location: PACU  Anesthesia Type:General  Level of Consciousness: drowsy  Airway & Oxygen Therapy: Patient Spontanous Breathing and Patient connected to face mask oxygen  Post-op Assessment: Report given to RN and Post -op Vital signs reviewed and stable  Post vital signs: Reviewed and stable  Last Vitals:  Vitals Value Taken Time  BP 159/67 09/14/22 1427  Temp    Pulse 96 09/14/22 1429  Resp 18 09/14/22 1429  SpO2 100 % 09/14/22 1429  Vitals shown include unfiled device data.  Last Pain:  Vitals:   09/14/22 1224  TempSrc: Temporal  PainSc: 10-Worst pain ever      Patients Stated Pain Goal: 0 (09/14/22 0848)  Complications: No notable events documented.

## 2022-09-14 NOTE — ED Provider Notes (Signed)
[facility] Provider Note    Event Date/Time   First MD Initiated Contact with Patient 09/14/22 [phone]     (approximate)   History   Abdominal Pain   HPI Tanya Rhodes is a 82 y.o. female who presents for evaluation of gradually worsening abdominal pain for about 2 days.  She said that it started relatively mild but it is become severe.  She thinks it is in her lower abdomen but she cannot tell exactly.  She has had nausea and vomiting as well.  She denies fever, chest pain, and shortness of breath.  The pain is constant and goes through to her back.  She has never had any abdominal surgeries including cholecystectomy.     Physical Exam   Triage Vital Signs: ED Triage Vitals  Encounter Vitals Group     BP 09/14/22 0214 (!) 144/83     Systolic BP Percentile --      Diastolic BP Percentile --      Pulse Rate 09/14/22 0214 99     Resp 09/14/22 0214 18     Temp 09/14/22 0214 98.3 F (36.8 C)     Temp src --      SpO2 09/14/22 0214 98 %     Weight 09/14/22 0215 48.1 kg (106 lb)     Height 09/14/22 0215 1.524 m (5')     Head Circumference --      Peak Flow --      Pain Score 09/14/22 0214 9     Pain Loc --      Pain Education --      Exclude from Growth Chart --     Most recent vital signs: Vitals:   09/14/22 0214 09/14/22 0534  BP: (!) 144/83 (!) 168/79  Pulse: 99 98  Resp: 18 17  Temp: 98.3 F (36.8 C)   SpO2: 98% 100%    General: Awake, alert, but appears quite uncomfortable and in pain. CV:  Good peripheral perfusion.  Borderline tachycardia, regular rhythm.  Normal heart sounds. Resp:  Normal effort. Speaking easily and comfortably, no accessory muscle usage nor intercostal retractions.  Lungs clear to auscultation. Abd:  No distention.  No lower abdominal tenderness to palpation including in the periumbilical region.  However she has substantial tenderness in the epigastrium and right upper quadrant with guarding and positive Murphy  sign.  No palpable pulsatile abdominal masses, no bruit.   ED Results / Procedures / Treatments   Labs (all labs ordered are listed, but only abnormal results are displayed) Labs Reviewed  COMPREHENSIVE METABOLIC PANEL - Abnormal; Notable for the following components:      Result Value   Sodium 128 (*)    Chloride 93 (*)    Glucose, Bld 154 (*)    All other components within normal limits  CBC - Abnormal; Notable for the following components:   WBC 11.3 (*)    All other components within normal limits  LIPASE, BLOOD  URINALYSIS, ROUTINE W REFLEX MICROSCOPIC     RADIOLOGY I viewed and interpreted the patient's ultrasound of the aorta and the right upper quadrant.  See hospital course for details, but ultrasound is consistent with cholecystitis.   PROCEDURES:  Critical Care performed: No  .1-3 Lead EKG Interpretation  Performed by: Loleta Rose, MD Authorized by: Loleta Rose, MD     Interpretation: normal     ECG rate:  98   ECG rate assessment: normal     Rhythm: sinus  rhythm     Ectopy: none     Conduction: normal       IMPRESSION / MDM / ASSESSMENT AND PLAN / ED COURSE  I reviewed the triage vital signs and the nursing notes.                              Differential diagnosis includes, but is not limited to, biliary disease including cholecystitis, pancreatitis, AAS/AAA, SBO/ileus, appendicitis, diverticulitis.  Patient's presentation is most consistent with acute presentation with potential threat to life or bodily function.  Labs/studies ordered: CBC, lipase, CMP, ultrasound of the aorta and ultrasound of the right upper quadrant.  Interventions/Medications given:  Medications  metroNIDAZOLE (FLAGYL) IVPB 500 mg (500 mg Intravenous New Bag/Given 09/14/22 0605)  morphine (PF) 4 MG/ML injection 4 mg (4 mg Intravenous Given 09/14/22 0350)  ondansetron (ZOFRAN) injection 4 mg (4 mg Intravenous Given 09/14/22 0350)  sodium chloride 0.9 % bolus 500 mL (0 mLs  Intravenous Stopped 09/14/22 0605)  cefTRIAXone (ROCEPHIN) 1 g in sodium chloride 0.9 % 100 mL IVPB (0 g Intravenous Stopped 09/14/22 0702)  0.9 %  sodium chloride infusion ( Intravenous New Bag/Given 09/14/22 0605)  morphine (PF) 4 MG/ML injection 4 mg (4 mg Intravenous Given 09/14/22 0602)    (Note:  hospital course my include additional interventions and/or labs/studies not listed above.)   Patient appears to be in pain and I ordered for morphine 4 mg IV and Zofran 4 mg IV.  Her labs are notable for mild hyponatremia and I ordered a 500 mL normal saline IV bolus.  Initially when I saw the patient anticipate ordering a CT scan, but she is very selectively tender in the right upper quadrant with positive Murphy sign.  Although she has no transaminitis nor elevation of lipase, I think she would benefit initially from an ultrasound to rule out cholecystitis.  If her ultrasound does not reveal a likely cause of her symptoms, I will proceed with a CT of the abdomen and pelvis.  I also ordered an ultrasound of her aorta to look for evidence of AAA.  The patient is on the cardiac monitor to evaluate for evidence of arrhythmia and/or significant heart rate changes.  Clinical Course as of 09/14/22 0704  Thu Sep 14, 2022  6962 US Aorta I viewed and interpreted the patient's aortic ultrasound.  I see no evidence of AAA.  Radiology agrees no acute findings. [CF]  0544 US ABDOMEN LIMITED RUQ (LIVER/GB) I viewed and interpreted the patient's right upper quadrant ultrasound.  It is concerning for cholecystitis based on the gallbladder wall thickening and some pericholecystic fluid.  Radiologist also mention that the common bile duct is slightly dilated.  However, she has no clinical or laboratory signs or symptoms to suggest choledocholithiasis, with normal LFTs including her total bilirubin, no jaundice, no scleral icterus, etc.  I consulted Dr. Maia Plan with general surgery and explained the situation.  He will  review the case and proceed with recommendations. [CF]  [phone] Reassessed patient.  She is still hurting and I am ordering another 4 mg of IV morphine.  I explained to her the diagnosis of cholecystitis and that I had spoken with Dr. Maia Plan and he will be evaluating her case. [CF]  0600 Of note, I also begin empiric treatment with ceftriaxone 1 g IV and metronidazole 500 mg IV. [CF]  0703 Confirmed with Dr. Maia Plan that he has seen the patient in person  and will put in admission orders [CF]    Clinical Course User Index [CF] Loleta Rose, MD     FINAL CLINICAL IMPRESSION(S) / ED DIAGNOSES   Final diagnoses:  Acute cholecystitis     Rx / DC Orders   ED Discharge Orders     None        Note:  This document was prepared using Dragon voice recognition software and may include unintentional dictation errors.   Loleta Rose, MD 09/14/22 [phone]

## 2022-09-15 MED ORDER — HYDROCODONE-ACETAMINOPHEN 5-325 MG PO TABS: 1.0000 | ORAL_TABLET | ORAL | 0 refills | Status: AC | PRN

## 2022-09-15 MED ORDER — ACETAMINOPHEN 325 MG PO TABS
650.0000 mg | ORAL_TABLET | Freq: Four times a day (QID) | ORAL | Status: DC | PRN
  Administered 2022-09-15: 650 mg via ORAL
  Filled 2022-09-15: qty 2

## 2022-09-15 NOTE — Discharge Summary (Signed)
Patient ID: Tanya Rhodes MRN: [id number] DOB/AGE: 13-Jun-1940 82 y.o.  Admit date: 09/14/2022 Discharge date: 09/15/2022   Discharge Diagnoses:  Principal Problem:   Acute cholecystitis   Procedures: Robotic assisted laparoscopic cholecystectomy  Hospital Course: Patient admitted with acute cholecystitis.  She underwent robotic cholecystectomy.  She has been recovering well.  The pain is controlled.  Patient tolerating diet.  Patient ambulating.  Patient doing well.  The incision is dry and clean.  Patient denies any chest pain or shortness of breath.  No sign of complications.  Physical Exam Vitals reviewed.  HENT:     Head: Normocephalic.  Cardiovascular:     Rate and Rhythm: Normal rate and regular rhythm.  Pulmonary:     Effort: Pulmonary effort is normal.     Breath sounds: Normal breath sounds.  Abdominal:     General: Abdomen is flat and protuberant. Bowel sounds are normal.     Palpations: Abdomen is soft.  Neurological:     Mental Status: She is alert.      Consults: None  Disposition:    Allergies as of 09/15/2022       Reactions   Aspirin Other (See Comments)   GIB   Ropinirole Anxiety, Other (See Comments)   Reaction:  Severe leg pain    Ferrous Fumarate Itching   Ferrous Sulfate Diarrhea, Nausea And Vomiting   Omeprazole Nausea And Vomiting   Oxycodone Other (See Comments)   Reaction:  Makes pt hyper    Prednisone Other (See Comments)   Reaction:  Makes pt hyper         Medication List     TAKE these medications    acetaminophen 500 MG tablet Commonly known as: TYLENOL Take 500 mg by mouth every 6 (six) hours as needed for mild pain, moderate pain, fever or headache.   albuterol 108 (90 Base) MCG/ACT inhaler Commonly known as: VENTOLIN HFA Inhale 2 puffs into the lungs every 4 (four) hours as needed for wheezing or shortness of breath.   albuterol (2.5 MG/3ML) 0.083% nebulizer solution Commonly known as: PROVENTIL Take 2.5 mg by  nebulization every 6 (six) hours as needed for wheezing or shortness of breath.   ALPRAZolam 0.5 MG tablet Commonly known as: XANAX Take 0.5 mg by mouth daily as needed.   budesonide-formoterol 160-4.5 MCG/ACT inhaler Commonly known as: SYMBICORT Inhale 2 puffs into the lungs 2 (two) times daily.   calcium citrate-vitamin D 315-200 MG-UNIT tablet Commonly known as: CITRACAL+D Take 1 tablet by mouth daily.   cyanocobalamin 1000 MCG tablet Take 1,000 mcg by mouth daily.   escitalopram 10 MG tablet Commonly known as: LEXAPRO Take 10 mg by mouth daily.   fluticasone 50 MCG/ACT nasal spray Commonly known as: FLONASE Place 2 sprays into both nostrils daily.   furosemide 20 MG tablet Commonly known as: LASIX Take 20 mg by mouth daily.   gabapentin 300 MG capsule Commonly known as: NEURONTIN Take 300 mg by mouth daily.   HYDROcodone-acetaminophen 5-325 MG tablet Commonly known as: Norco Take 1 tablet by mouth every 4 (four) hours as needed for up to 3 days for moderate pain.   lisinopril 2.5 MG tablet Commonly known as: ZESTRIL Take 2.5 mg by mouth daily.   metoprolol succinate 25 MG 24 hr tablet Commonly known as: TOPROL-XL Take 25 mg by mouth daily.   pantoprazole 40 MG tablet Commonly known as: PROTONIX Take 1 tablet by mouth daily.   Spiriva Respimat 2.5 MCG/ACT Aers Generic drug: Tiotropium  Bromide Monohydrate Inhale 2 puffs into the lungs daily.   traMADol 50 MG tablet Commonly known as: ULTRAM Take 75 mg by mouth 2 (two) times daily as needed for moderate pain.   Vitamin D (Ergocalciferol) 1.25 MG (50000 UNIT) Caps capsule Commonly known as: DRISDOL Take 50,000 Units by mouth every 7 (seven) days. Pt takes on Saturday.        Follow-up Information     Carolan Shiver, MD Follow up in 2 week(s).   Specialty: General Surgery Why: Follow up after cholecystectomy Contact information: [address] [place] [address] [phone]

## 2022-09-15 NOTE — Progress Notes (Signed)
IV removed. Discharge education completed. Patient being assisted to get dressed by staff. Awaiting son to arrive for transport home.  Tanya Rhodes

## 2022-09-15 NOTE — Plan of Care (Signed)
Adequate for discharge, Resolve care plan.  Cornell Barman Izaac Reisig

## 2022-09-15 NOTE — Care Management Obs Status (Signed)
MEDICARE OBSERVATION STATUS NOTIFICATION   Patient Details  Name: Tanya Rhodes MRN: [id number] Date of Birth: 1940/02/02   Medicare Observation Status Notification Given:  Yes    Margarito Liner, LCSW 09/15/2022, 9:39 AM

## 2022-09-15 NOTE — Progress Notes (Signed)
Mobility Specialist - Progress Note   Pre-mobility: SpO2(99) During mobility: HR(82),SpO2(93)      09/15/22 0949  Mobility  Activity Ambulated with assistance in hallway;Stood at bedside  Level of Assistance Standby assist, set-up cues, supervision of patient - no hands on  Assistive Device Other (Comment) (IV Pole hold)  Distance Ambulated (ft) 140 ft  Range of Motion/Exercises Active  Activity Response Tolerated well  Mobility Referral Yes  $Mobility charge 1 Mobility  Mobility Specialist Start Time (ACUTE ONLY) [id number]  Mobility Specialist Stop Time (ACUTE ONLY) 0949  Mobility Specialist Time Calculation (min) (ACUTE ONLY) 25 min   Pt resting in bed on 2L upon entry. Pt STS and ambulates to hallway around NS holding onto IV pole for additional stability SBA. Pt took x2 standing rest breaks during ambulation. Pt returned to bed and left with needs in reach.   Johnathan Hausen Mobility Specialist 09/15/22, 9:53 AM

## 2022-09-15 NOTE — Discharge Instructions (Signed)

## 2022-09-15 NOTE — TOC CM/SW Note (Signed)
Transition of Care [facility]) - Inpatient Brief Assessment   Patient Details  Name: Tanya Rhodes MRN: [id number] Date of Birth: 12-24-1940  [facility] CM/SW Contact:    Margarito Liner, LCSW Phone Number: 09/15/2022, 10:52 AM   Clinical Narrative: Patient has orders to discharge home today. Chart reviewed. No TOC needs identified. Patient stated her son will transport her home and bring her oxygen for the ride. CSW signing off.  Transition of Care Asessment: Insurance and Status: Insurance coverage has been reviewed Patient has primary care physician: Yes Home environment has been reviewed: Scientist, clinical (histocompatibility and immunogenetics) Home Services: No current home services Social Determinants of Health Reivew: SDOH reviewed no interventions necessary Readmission risk has been reviewed: Yes Transition of care needs: no transition of care needs at this time

## 2022-09-18 NOTE — Group Note (Deleted)

## 2022-09-29 ENCOUNTER — Other Ambulatory Visit: Payer: Self-pay | Admitting: General Surgery

## 2022-09-29 ENCOUNTER — Ambulatory Visit: Payer: Self-pay | Admitting: General Surgery

## 2022-09-29 DIAGNOSIS — N6091 Unspecified benign mammary dysplasia of right breast: Secondary | ICD-10-CM

## 2022-09-29 DIAGNOSIS — R928 Other abnormal and inconclusive findings on diagnostic imaging of breast: Secondary | ICD-10-CM

## 2022-09-29 NOTE — H&P (View-Only) (Signed)
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  Patient previously found with atypical ductal hyperplasia of the right breast.  She was getting workup for excisional biopsy.  She was sent for clearance for cardiology.  She was evaluated by cardiology.  She has been stable from her cardiac conditions.  They assess that she is acceptable risk for excisional biopsy of the right breast.  Patient had recent acute cholecystitis and she underwent robotic cholecystectomy.  She tolerated the procedure well.      PAST MEDICAL HISTORY:  Past Medical History:  Diagnosis Date   Acid reflux    Allergic rhinitis    Anemia    Anxiety    Arrhythmia    Breast cancer (CMS/HHS-HCC) 07/2022   Cataract cortical, senile    CHF (congestive heart failure) (CMS/HHS-HCC)    Colon polyp    Colon polyposis    Personal history of adenomatous colon polyposis. History of tubular adenomatous polyp removed in pieces over several colonoscopies 2008.  History of multiple other tubular adenomatous polyps.   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 Shelle Iron   EGD  05/15/2013   Normal - no repeat per Dr. Shelle Iron   COLONOSCOPY  12/08/2019   Diverticulosis/Otherwise normal/No repeat due to age/CTL   EGD  12/08/2019   Esophagitis/No repeat/CTL   ROBOT ASSISTED LAPAROSCOPIC CHOLECYSTECTOMY  09/15/2022   ARTHROSCOPIC ROTATOR CUFF REPAIR  1022/2010   subacromial decompression right shoulder plus mini incision RCR   CARDIAC CATHETERIZATION     CATARACT EXTRACTION     COLONOSCOPY  04/04/2002, 03/05/2006, 05/23/2006, 08/14/2007, 05/15/2011   ENDOSCOPIC CARPAL TUNNEL RELEASE     Left   TONSILLECTOMY     UPPER GASTROINTESTINAL ENDOSCOPY     UPPER GASTROINTESTINAL ENDOSCOPY           MEDICATIONS:  Outpatient Encounter Medications as of 09/29/2022  Medication Sig Dispense Refill   acetaminophen (TYLENOL) 500 MG tablet Take 500 mg by mouth every 6 (six) hours as needed for Pain.     albuterol (PROVENTIL) 2.5 mg /3 mL (0.083 %) nebulizer solution USE 1 VAIL VIA NEBULIZER EVERY 6 HOUR AS NEEDED FOR WHEEZING 375 mL 5   albuterol MDI, PROVENTIL, VENTOLIN, PROAIR,  HFA 90 mcg/actuation inhaler INHALE 2 PUFFS INTO THE LUNGS EVERY 4 HORUS AS NEEDED FOR WHEEZING 8.5 each 4   ALPRAZolam (XANAX) 0.5 MG tablet Take 1 tablet (0.5 mg total) by mouth once daily as needed for Sleep 30 tablet 2   calcium citrate-vitamin D3 (CITRACAL+D) 315-200 mg-unit tablet Take 2 tablets by mouth once daily     cyanocobalamin (VITAMIN B12) 1000 MCG tablet Take 1,000 mcg by mouth once daily.     escitalopram oxalate (LEXAPRO) 10 MG tablet TAKE 1 TABLET (10 MG TOTAL) BY MOUTH EVERY DAY FOR 180 DAYS 90 tablet 1   fluticasone propionate (FLONASE) 50 mcg/actuation nasal spray SHAKE LIQUID AND USE 2 SPRAYS IN EACH  NOSTRIL EVERY DAY 48 g 3   FUROsemide (LASIX) 20 MG tablet TAKE 1 TABLET(20 MG) BY MOUTH EVERY DAY 30 tablet 5   gabapentin (NEURONTIN) 300 MG capsule TAKE 1 CAPSULE (300 MG TOTAL) BY MOUTH AT BEDTIME FOR 180 DAYS 90 capsule 1   L.acid/L.casei/B.bif/B.lon/FOS (PROBIOTIC BLEND ORAL) Take 1 capsule by mouth once daily     lisinopriL (ZESTRIL) 2.5 MG tablet TAKE 1 TABLET BY MOUTH EVERY DAY 90 tablet 1   metoprolol succinate (TOPROL-XL) 25 MG XL tablet Take 1 tablet (25 mg total) by mouth once daily 90 tablet 1   pantoprazole (PROTONIX) 40 MG DR tablet take 1 tablet by mouth every day 90 tablet 1   simvastatin (ZOCOR) 20 MG tablet Take 1 tablet (20 mg total) by mouth at bedtime 90 tablet 1   tiotropium bromide (SPIRIVA RESPIMAT) 2.5 mcg/actuation inhalation spray Inhale 2 inhalations (5 mcg total) into the lungs once daily 4 g 5   traMADoL (ULTRAM) 50 mg tablet Take 1 tablet (50 mg total) by mouth once daily as needed for Pain 30 tablet 2   budesonide-formoteroL (SYMBICORT) 160-4.5 mcg/actuation inhaler Inhale 2 inhalations into the lungs 2 (two) times daily for 180 days 10.2 g 11   ergocalciferol, vitamin D2, 1,250 mcg (50,000 unit) capsule TAKE 1 CAPSULE (50,000 UNITS TOTAL) BY MOUTH EVERY 7 (SEVEN) DAYS FOR 180 DAYS 12 capsule 1   No facility-administered encounter medications on file as of 09/29/2022.     ALLERGIES:   Aspirin, Ferretts [ferrous fumarate], Ferrous sulfate, Omeprazole, Oxycodone, Prednisone, and Requip [ropinirole]   SOCIAL HISTORY:  Social History   Socioeconomic History   Marital status: Widowed  Tobacco Use   Smoking status: Former    Current packs/day: 0.00    Average packs/day: 1.5 packs/day for 35.0 years (52.5 ttl pk-yrs)    Types: Cigarettes    Start date: 11/10/1958    Quit date: 11/09/1993    Years since quitting: 28.9   Smokeless tobacco: Never   Tobacco comments:    Quit Smoking 1995  Vaping Use   Vaping status: Never Used  Substance and Sexual Activity    Alcohol use: No    Alcohol/week: 0.0 standard drinks of alcohol   Drug use: No   Sexual activity: Not Currently  Social History Narrative   Widowed in 2011.  Two kids.  Two grandkids, two greats.  Job: retried from [employer].  Exercise: walks.  Wears seatbelt.  Avoids sun.  Dentist: not currently.        Advanced Diretives: her kids know her wishes.     Social Determinants of Health   Financial Resource Strain: Low Risk  (06/23/2022)   Overall Financial Resource Strain (CARDIA)    Difficulty of Paying Living Expenses: Not hard at all  Food Insecurity: No  Food Insecurity (09/14/2022)   Received from [facility]   Hunger Vital Sign    Worried About Running Out of Food in the Last Year: Never true    Ran Out of Food in the Last Year: Never true  Transportation Needs: No Transportation Needs (09/14/2022)   Received from [facility] - Transportation    Lack of Transportation (Medical): No    Lack of Transportation (Non-Medical): No    FAMILY HISTORY:  Family History  Problem Relation Name Age of Onset   Alzheimer's disease Mother Wiliam Ke    Other Mother Wiliam Ke        Blood Clot   Stroke Mother Wiliam Ke    Melanoma Brother Laurence Aly    Colon polyps Brother Alexia Freestone Guthrie    Coronary Artery Disease (Blocked arteries around heart) Brother Laurence Aly    Prostate cancer Brother Alexia Freestone Guthrie    Melanoma Other Nephews    High blood pressure (Hypertension) Other Nephews    Alcohol abuse Father     Stroke Sister     Myocardial Infarction (Heart attack) Brother     Heart disease Brother     Colon polyps Brother     Skin cancer Brother Gene Roland Guthrie    No Known Problems Brother 1/2 Brother    Alzheimer's disease Sister Inis Sizer. [place]    Diabetes Sister     Aneurysm Sister     Clotting disorder Sister Modena Jansky    Colon polyps Sister Modena Jansky    Gout Son Gout    No Known Problems Son      Colon polyps Sister Velsie Mcadams    Stroke Brother ---------------------------------------    Celiac disease Neg Hx       GENERAL REVIEW OF SYSTEMS:   General ROS: negative for - chills, fatigue, fever, weight gain or weight loss Allergy and Immunology ROS: negative for - hives  Hematological and Lymphatic ROS: negative for - bleeding problems or bruising, negative for palpable nodes Endocrine ROS: negative for - heat or cold intolerance, hair changes Respiratory ROS: negative for - cough, shortness of breath or wheezing Cardiovascular ROS: no chest pain or palpitations GI ROS: negative for nausea, vomiting, abdominal pain, diarrhea, constipation Musculoskeletal ROS: negative for - joint swelling or muscle pain Neurological ROS: negative for - confusion, syncope Dermatological ROS: negative for pruritus and rash  PHYSICAL EXAM:  Vitals:   09/29/22 0848  BP: 109/54  Pulse: 82  .  Ht:152.4 cm (5') Wt:47.2 kg (104 lb) ZOX:WRUE surface area is 1.41 meters squared. Body mass index is 20.31 kg/m.Marland Kitchen   GENERAL: Alert, active, oriented x3  HEENT: Pupils equal reactive to light. Extraocular movements are intact. Sclera clear. Palpebral conjunctiva normal red color.Pharynx clear.  NECK: Supple with no palpable mass and no adenopathy.  LUNGS: Sound clear with no rales rhonchi or wheezes.  HEART: Regular rhythm S1 and S2 without murmur.  BREAST: Both breast examined in the sitting and supine position.  There is no palpable masses, skin changes, nipple retraction or nipple discharge.  ABDOMEN: Soft and depressible, nontender with no palpable mass, no hepatomegaly. Wounds dry and clean.  EXTREMITIES: Well-developed well-nourished symmetrical with no dependent edema.  NEUROLOGICAL: Awake alert oriented, facial expression symmetrical, moving all extremities.      IMPRESSION:     Atypical ductal hyperplasia of right breast [N60.91]  -Patient again oriented about the benefit of  excisional biopsy  to rule out malignancy -Patient reports she understand and agree to proceed with radiofrequency tag excisional biopsy of the right breast -Cardiac clearance granted -Patient recovering well from robotic cholecystectomy.  Having some diarrhea.  Discussed to take Imodium.         PLAN:  1.  Right breast radiofrequency tag excisional biopsy (19125) 2.  Cardiology clearance done 3.  Hold aspirin or any blood thinners 5 days before the surgery 4.  Contact us if you have any concern  Patient verbalized understanding, all questions were answered, and were agreeable with the plan outlined above.   Carolan Shiver, MD  Electronically signed by Carolan Shiver, MD

## 2022-09-29 NOTE — H&P (Signed)
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  Patient previously found with atypical ductal hyperplasia of the right breast.  She was getting workup for excisional biopsy.  She was sent for clearance for cardiology.  She was evaluated by cardiology.  She has been stable from her cardiac conditions.  They assess that she is acceptable risk for excisional biopsy of the right breast.  Patient had recent acute cholecystitis and she underwent robotic cholecystectomy.  She tolerated the procedure well.      PAST MEDICAL HISTORY:  Past Medical History:  Diagnosis Date   Acid reflux    Allergic rhinitis    Anemia    Anxiety    Arrhythmia    Breast cancer (CMS/HHS-HCC) 07/2022   Cataract cortical, senile    CHF (congestive heart failure) (CMS/HHS-HCC)    Colon polyp    Colon polyposis    Personal history of adenomatous colon polyposis. History of tubular adenomatous polyp removed in pieces over several colonoscopies 2008.  History of multiple other tubular adenomatous polyps.   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 Shelle Iron   EGD  05/15/2013   Normal - no repeat per Dr. Shelle Iron   COLONOSCOPY  12/08/2019   Diverticulosis/Otherwise normal/No repeat due to age/CTL   EGD  12/08/2019   Esophagitis/No repeat/CTL   ROBOT ASSISTED LAPAROSCOPIC CHOLECYSTECTOMY  09/15/2022   ARTHROSCOPIC ROTATOR CUFF REPAIR  1022/2010   subacromial decompression right shoulder plus mini incision RCR   CARDIAC CATHETERIZATION     CATARACT EXTRACTION     COLONOSCOPY  04/04/2002, 03/05/2006, 05/23/2006, 08/14/2007, 05/15/2011   ENDOSCOPIC CARPAL TUNNEL RELEASE     Left   TONSILLECTOMY     UPPER GASTROINTESTINAL ENDOSCOPY     UPPER GASTROINTESTINAL ENDOSCOPY           MEDICATIONS:  Outpatient Encounter Medications as of 09/29/2022  Medication Sig Dispense Refill   acetaminophen (TYLENOL) 500 MG tablet Take 500 mg by mouth every 6 (six) hours as needed for Pain.     albuterol (PROVENTIL) 2.5 mg /3 mL (0.083 %) nebulizer solution USE 1 VAIL VIA NEBULIZER EVERY 6 HOUR AS NEEDED FOR WHEEZING 375 mL 5   albuterol MDI, PROVENTIL, VENTOLIN, PROAIR,  HFA 90 mcg/actuation inhaler INHALE 2 PUFFS INTO THE LUNGS EVERY 4 HORUS AS NEEDED FOR WHEEZING 8.5 each 4   ALPRAZolam (XANAX) 0.5 MG tablet Take 1 tablet (0.5 mg total) by mouth once daily as needed for Sleep 30 tablet 2   calcium citrate-vitamin D3 (CITRACAL+D) 315-200 mg-unit tablet Take 2 tablets by mouth once daily     cyanocobalamin (VITAMIN B12) 1000 MCG tablet Take 1,000 mcg by mouth once daily.     escitalopram oxalate (LEXAPRO) 10 MG tablet TAKE 1 TABLET (10 MG TOTAL) BY MOUTH EVERY DAY FOR 180 DAYS 90 tablet 1   fluticasone propionate (FLONASE) 50 mcg/actuation nasal spray SHAKE LIQUID AND USE 2 SPRAYS IN EACH  NOSTRIL EVERY DAY 48 g 3   FUROsemide (LASIX) 20 MG tablet TAKE 1 TABLET(20 MG) BY MOUTH EVERY DAY 30 tablet 5   gabapentin (NEURONTIN) 300 MG capsule TAKE 1 CAPSULE (300 MG TOTAL) BY MOUTH AT BEDTIME FOR 180 DAYS 90 capsule 1   L.acid/L.casei/B.bif/B.lon/FOS (PROBIOTIC BLEND ORAL) Take 1 capsule by mouth once daily     lisinopriL (ZESTRIL) 2.5 MG tablet TAKE 1 TABLET BY MOUTH EVERY DAY 90 tablet 1   metoprolol succinate (TOPROL-XL) 25 MG XL tablet Take 1 tablet (25 mg total) by mouth once daily 90 tablet 1   pantoprazole (PROTONIX) 40 MG DR tablet take 1 tablet by mouth every day 90 tablet 1   simvastatin (ZOCOR) 20 MG tablet Take 1 tablet (20 mg total) by mouth at bedtime 90 tablet 1   tiotropium bromide (SPIRIVA RESPIMAT) 2.5 mcg/actuation inhalation spray Inhale 2 inhalations (5 mcg total) into the lungs once daily 4 g 5   traMADoL (ULTRAM) 50 mg tablet Take 1 tablet (50 mg total) by mouth once daily as needed for Pain 30 tablet 2   budesonide-formoteroL (SYMBICORT) 160-4.5 mcg/actuation inhaler Inhale 2 inhalations into the lungs 2 (two) times daily for 180 days 10.2 g 11   ergocalciferol, vitamin D2, 1,250 mcg (50,000 unit) capsule TAKE 1 CAPSULE (50,000 UNITS TOTAL) BY MOUTH EVERY 7 (SEVEN) DAYS FOR 180 DAYS 12 capsule 1   No facility-administered encounter medications on file as of 09/29/2022.     ALLERGIES:   Aspirin, Ferretts [ferrous fumarate], Ferrous sulfate, Omeprazole, Oxycodone, Prednisone, and Requip [ropinirole]   SOCIAL HISTORY:  Social History   Socioeconomic History   Marital status: Widowed  Tobacco Use   Smoking status: Former    Current packs/day: 0.00    Average packs/day: 1.5 packs/day for 35.0 years (52.5 ttl pk-yrs)    Types: Cigarettes    Start date: 11/10/1958    Quit date: 11/09/1993    Years since quitting: 28.9   Smokeless tobacco: Never   Tobacco comments:    Quit Smoking 1995  Vaping Use   Vaping status: Never Used  Substance and Sexual Activity    Alcohol use: No    Alcohol/week: 0.0 standard drinks of alcohol   Drug use: No   Sexual activity: Not Currently  Social History Narrative   Widowed in 2011.  Two kids.  Two grandkids, two greats.  Job: retried from [employer].  Exercise: walks.  Wears seatbelt.  Avoids sun.  Dentist: not currently.        Advanced Diretives: her kids know her wishes.     Social Determinants of Health   Financial Resource Strain: Low Risk  (06/23/2022)   Overall Financial Resource Strain (CARDIA)    Difficulty of Paying Living Expenses: Not hard at all  Food Insecurity: No  Food Insecurity (09/14/2022)   Received from [facility]   Hunger Vital Sign    Worried About Running Out of Food in the Last Year: Never true    Ran Out of Food in the Last Year: Never true  Transportation Needs: No Transportation Needs (09/14/2022)   Received from [facility] - Transportation    Lack of Transportation (Medical): No    Lack of Transportation (Non-Medical): No    FAMILY HISTORY:  Family History  Problem Relation Name Age of Onset   Alzheimer's disease Mother Wiliam Ke    Other Mother Wiliam Ke        Blood Clot   Stroke Mother Wiliam Ke    Melanoma Brother Laurence Aly    Colon polyps Brother Alexia Freestone Guthrie    Coronary Artery Disease (Blocked arteries around heart) Brother Laurence Aly    Prostate cancer Brother Alexia Freestone Guthrie    Melanoma Other Nephews    High blood pressure (Hypertension) Other Nephews    Alcohol abuse Father     Stroke Sister     Myocardial Infarction (Heart attack) Brother     Heart disease Brother     Colon polyps Brother     Skin cancer Brother Gene Roland Guthrie    No Known Problems Brother 1/2 Brother    Alzheimer's disease Sister Inis Sizer. [place]    Diabetes Sister     Aneurysm Sister     Clotting disorder Sister Modena Jansky    Colon polyps Sister Modena Jansky    Gout Son Gout    No Known Problems Son      Colon polyps Sister Velsie Mcadams    Stroke Brother ---------------------------------------    Celiac disease Neg Hx       GENERAL REVIEW OF SYSTEMS:   General ROS: negative for - chills, fatigue, fever, weight gain or weight loss Allergy and Immunology ROS: negative for - hives  Hematological and Lymphatic ROS: negative for - bleeding problems or bruising, negative for palpable nodes Endocrine ROS: negative for - heat or cold intolerance, hair changes Respiratory ROS: negative for - cough, shortness of breath or wheezing Cardiovascular ROS: no chest pain or palpitations GI ROS: negative for nausea, vomiting, abdominal pain, diarrhea, constipation Musculoskeletal ROS: negative for - joint swelling or muscle pain Neurological ROS: negative for - confusion, syncope Dermatological ROS: negative for pruritus and rash  PHYSICAL EXAM:  Vitals:   09/29/22 0848  BP: 109/54  Pulse: 82  .  Ht:152.4 cm (5') Wt:47.2 kg (104 lb) ZOX:WRUE surface area is 1.41 meters squared. Body mass index is 20.31 kg/m.Marland Kitchen   GENERAL: Alert, active, oriented x3  HEENT: Pupils equal reactive to light. Extraocular movements are intact. Sclera clear. Palpebral conjunctiva normal red color.Pharynx clear.  NECK: Supple with no palpable mass and no adenopathy.  LUNGS: Sound clear with no rales rhonchi or wheezes.  HEART: Regular rhythm S1 and S2 without murmur.  BREAST: Both breast examined in the sitting and supine position.  There is no palpable masses, skin changes, nipple retraction or nipple discharge.  ABDOMEN: Soft and depressible, nontender with no palpable mass, no hepatomegaly. Wounds dry and clean.  EXTREMITIES: Well-developed well-nourished symmetrical with no dependent edema.  NEUROLOGICAL: Awake alert oriented, facial expression symmetrical, moving all extremities.      IMPRESSION:     Atypical ductal hyperplasia of right breast [N60.91]  -Patient again oriented about the benefit of  excisional biopsy  to rule out malignancy -Patient reports she understand and agree to proceed with radiofrequency tag excisional biopsy of the right breast -Cardiac clearance granted -Patient recovering well from robotic cholecystectomy.  Having some diarrhea.  Discussed to take Imodium.         PLAN:  1.  Right breast radiofrequency tag excisional biopsy (19125) 2.  Cardiology clearance done 3.  Hold aspirin or any blood thinners 5 days before the surgery 4.  Contact us if you have any concern  Patient verbalized understanding, all questions were answered, and were agreeable with the plan outlined above.   Carolan Shiver, MD  Electronically signed by Carolan Shiver, MD

## 2022-10-05 ENCOUNTER — Ambulatory Visit
Admission: RE | Admit: 2022-10-05 | Discharge: 2022-10-05 | Disposition: A | Discharge status: Home / Self Care | Payer: 59 | Source: Ambulatory Visit | Attending: General Surgery | Admitting: General Surgery

## 2022-10-05 ENCOUNTER — Encounter
Admission: RE | Admit: 2022-10-05 | Discharge: 2022-10-05 | Disposition: A | Discharge status: Home / Self Care | Payer: 59 | Source: Ambulatory Visit | Attending: General Surgery | Admitting: General Surgery

## 2022-10-05 DIAGNOSIS — N6091 Unspecified benign mammary dysplasia of right breast: Secondary | ICD-10-CM | POA: Diagnosis present

## 2022-10-05 DIAGNOSIS — R928 Other abnormal and inconclusive findings on diagnostic imaging of breast: Secondary | ICD-10-CM | POA: Diagnosis present

## 2022-10-05 HISTORY — DX: Acute cholecystitis: K81.0

## 2022-10-05 HISTORY — DX: Atherosclerosis of aorta: I70.0

## 2022-10-05 HISTORY — DX: Personal history of nicotine dependence: Z87.891

## 2022-10-05 HISTORY — DX: Solitary pulmonary nodule: R91.1

## 2022-10-05 HISTORY — DX: Deficiency of other specified B group vitamins: E53.8

## 2022-10-05 HISTORY — PX: BREAST BIOPSY: SHX20

## 2022-10-05 HISTORY — DX: Dyspnea, unspecified: R06.00

## 2022-10-05 HISTORY — DX: Dilated cardiomyopathy: I42.0

## 2022-10-05 MED ORDER — LIDOCAINE HCL 1 % IJ SOLN
10.0000 mL | Freq: Once | INTRAMUSCULAR | Status: AC
  Administered 2022-10-05: 10 mL
  Filled 2022-10-05: qty 10

## 2022-10-05 NOTE — Patient Instructions (Signed)
Your procedure is scheduled on:10-13-22 Friday Report to the Registration Desk on the 1st floor of the [facility].Then proceed to the 2nd floor Surgery Desk To find out your arrival time, please call [phone] between 1PM - 3PM on:10-12-22 Thursday If your arrival time is 6:00 am, do not arrive before that time as the [facility] entrance doors do not open until 6:00 am.  REMEMBER: Instructions that are not followed completely may result in serious medical risk, up to and including death; or upon the discretion of your surgeon and anesthesiologist your surgery may need to be rescheduled.  Do not eat food OR drink any liquids after midnight the night before surgery.  No gum chewing or hard candies.  One week prior to surgery: Stop Anti-inflammatories (NSAIDS) such as Advil, Aleve, Ibuprofen, Motrin, Naproxen, Naprosyn and Aspirin based products such as Excedrin, Goody's Powder, BC Powder.You may however, take Tylenol if needed for pain up until the day of surgery. Stop ANY OVER THE COUNTER supplements/vitamins NOW (10-05-22) until after surgery (Vitamin B12)   Continue taking all prescribed medications   TAKE ONLY THESE MEDICATIONS THE MORNING OF SURGERY WITH A SIP OF WATER: -pantoprazole (PROTONIX)  -You may take ALPRAZolam Prudy Feeler) if needed for anxiety  Use your Albuterol Nebulizer, budesonide-formoterol (SYMBICORT) Inhaler and Spiriva Inhaler the morning of surgery and bring your Albuterol Inhaler to the hospital  No Alcohol for 24 hours before or after surgery.  No Smoking including e-cigarettes for 24 hours before surgery.  No chewable tobacco products for at least 6 hours before surgery.  No nicotine patches on the day of surgery.  Do not use any "recreational" drugs for at least a week (preferably 2 weeks) before your surgery.  Please be advised that the combination of cocaine and anesthesia may have negative outcomes, up to and including death. If you test positive for  cocaine, your surgery will be cancelled.  On the morning of surgery brush your teeth with toothpaste and water, you may rinse your mouth with mouthwash if you wish. Do not swallow any toothpaste or mouthwash.  Use CHG Soap as directed on instruction sheet.  Do not wear jewelry, make-up, hairpins, clips or nail polish.  For welded (permanent) jewelry: bracelets, anklets, waist bands, etc.  Please have this removed prior to surgery.  If it is not removed, there is a chance that hospital personnel will need to cut it off on the day of surgery.  Do not wear lotions, powders, or perfumes.   Do not shave body hair from the neck down 48 hours before surgery.  Contact lenses, hearing aids and dentures may not be worn into surgery.  Do not bring valuables to the hospital. [facility] is not responsible for any missing/lost belongings or valuables.   Notify your doctor if there is any change in your medical condition (cold, fever, infection).  Wear comfortable clothing (specific to your surgery type) to the hospital.  After surgery, you can help prevent lung complications by doing breathing exercises.  Take deep breaths and cough every 1-2 hours. Your doctor may order a device called an Incentive Spirometer to help you take deep breaths. When coughing or sneezing, hold a pillow firmly against your incision with both hands. This is called "splinting." Doing this helps protect your incision. It also decreases belly discomfort.  If you are being admitted to the hospital overnight, leave your suitcase in the car. After surgery it may be brought to your room.  In case of increased  patient census, it may be necessary for you, the patient, to continue your postoperative care in the Same Day Surgery department.  If you are being discharged the day of surgery, you will not be allowed to drive home. You will need a responsible individual to drive you home and stay with you for 24 hours after surgery.    If you are taking public transportation, you will need to have a responsible individual with you.  Please call the Pre-admissions Testing Dept. at [phone] if you have any questions about these instructions.  Surgery Visitation Policy:  Patients having surgery or a procedure may have two visitors.  Children under the age of 27 must have an adult with them who is not the patient.     Preparing for Surgery with CHLORHEXIDINE GLUCONATE (CHG) Soap  Chlorhexidine Gluconate (CHG) Soap  o An antiseptic cleaner that kills germs and bonds with the skin to continue killing germs even after washing  o Used for showering the night before surgery and morning of surgery  Before surgery, you can play an important role by reducing the number of germs on your skin.  CHG (Chlorhexidine gluconate) soap is an antiseptic cleanser which kills germs and bonds with the skin to continue killing germs even after washing.  Please do not use if you have an allergy to CHG or antibacterial soaps. If your skin becomes reddened/irritated stop using the CHG.  1. Shower the NIGHT BEFORE SURGERY and the MORNING OF SURGERY with CHG soap.  2. If you choose to wash your hair, wash your hair first as usual with your normal shampoo.  3. After shampooing, rinse your hair and body thoroughly to remove the shampoo.  4. Use CHG as you would any other liquid soap. You can apply CHG directly to the skin and wash gently with a scrungie or a clean washcloth.  5. Apply the CHG soap to your body only from the neck down. Do not use on open wounds or open sores. Avoid contact with your eyes, ears, mouth, and genitals (private parts). Wash face and genitals (private parts) with your normal soap.  6. Wash thoroughly, paying special attention to the area where your surgery will be performed.  7. Thoroughly rinse your body with warm water.  8. Do not shower/wash with your normal soap after using and rinsing off the CHG  soap.  9. Pat yourself dry with a clean towel.  10. Wear clean pajamas to bed the night before surgery.  12. Place clean sheets on your bed the night of your first shower and do not sleep with pets.  13. Shower again with the CHG soap on the day of surgery prior to arriving at the hospital.  14. Do not apply any deodorants/lotions/powders.  15. Please wear clean clothes to the hospital.

## 2022-10-10 ENCOUNTER — Encounter: Payer: Self-pay | Admitting: General Surgery

## 2022-10-10 NOTE — Progress Notes (Signed)
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 Clinical nursing staff has updated patient's PMH/PSHx, current medication list, and drug allergies/intolerances to ensure comprehensive history available to assist in medical decision making as it pertains to the aforementioned surgical procedure and anticipated anesthetic course. Extensive review of available clinical information personally performed. Tanya Rhodes PMH and PSHx updated with any diagnoses/procedures that  may have been inadvertently omitted during her intake with the pre-admission testing department's nursing staff.  Clinical Discussion:  Tanya Rhodes is a 82 y.o. female who is submitted for pre-surgical anesthesia review and clearance prior to her undergoing the above procedure. Patient is a Former Smoker (78.8 pack years; quit 11/1993). Pertinent PMH includes: CAD, dilated cardiomyopathy, CHF, LBBB, aortic atherosclerosis, HTN, HLD, COPD (on supplemental oxygen), GERD (on daily PPI), PUD, IDA, lumbar DDD, OA, RLS, depression, anxiety (on BZO).  Patient is followed by cardiology Darrold Junker, MD). She was last seen in the cardiology clinic on 08/28/2022; notes reviewed. At the time of her clinic visit, patient doing well overall from a cardiovascular perspective. Patient with complaints of chronic exertional dyspnea related to her underlying COPD  diagnosis. Patient is jointly managed by pulmonary medicine Meredeth Ide, MD). Patient also has peripheral edema reported to be stable and at baseline.Patient denied any chest pain, PND, orthopnea, palpitations, weakness, fatigue, vertiginous symptoms, or presyncope/syncope. Patient with a past medical history significant for cardiovascular diagnoses. Documented physical exam was grossly benign, providing no evidence of acute exacerbation and/or decompensation of the patient's known cardiovascular conditions.  Diagnostic LEFT heart catheterization on 07/06/2008 revealing multivessel CAD; 20% mid LAD and 95% proximal RCA.  PCI was subsequently performed placing a 3.5 x 18 mm vision BMS to the proximal RCA.  Procedure yielded excellent angiographic result and TIMI-3 flow.  Most recent TTE was performed on 08/24/2022 revealing a mildly reduced left ventricular systolic function with an EF of 40%.  There was septal hypokinesis. Left ventricular diastolic Doppler parameters consistent with abnormal relaxation (G1DD).  Right ventricular size and function normal.  There was trivial to mild aortic, mitral, and tricuspid valve regurgitation. All transvalvular gradients were noted to be normal providing no evidence suggestive of valvular stenosis. Aorta normal in size with no evidence of aneurysmal dilatation.  Most recent myocardial perfusion imaging study was performed on 08/24/2022 revealing a mildly reduced left ventricular systolic function with an EF of 49%.  There were no regional wall motion abnormalities.  SPECT images demonstrated a small reversible perfusion abnormality of mild intensity present in the inferior region on stress images.  Findings consistent with mild inferior ischemia.  Despite presence of an older generation BMS, patient is not on any type of daily OAC or antithrombotic therapies. Blood pressure reasonably controlled at 130/54 mmHg on currently prescribed diuretic (furosemide) and ACEi  (lisinopril) therapies.  Patient is on patient not currently taking any type of lipid-lowering therapies for her HLD diagnosis and further ASCVD prevention. Patient does not have  an OSAH diagnosis.  Patient making efforts to maintain an active lifestyle, however she does not have a formal exercise regimen.  She is somewhat limited by her age, respiratory status, and arthritides.  With that said, she is able to complete all of her ADLs/IADLs without significant cardiovascular limitation.  Per the DASI, patient is able to achieve at least 4 METS of physical activity without experiencing any significant degree of angina/anginal equivalent symptoms.  No changes were made to her medication regimen.  Patient to follow-up with outpatient cardiology in 3 months or sooner if needed.  Tanya Rhodes underwent core needle biopsy of the RIGHT breast on 07/21/2022.  Pathology (+) for atypical ductal hyperplasia. Subsequent IHC testing revealed loss of CK5/6 in a ER/PR (+) sample. Patient scheduled for consult with general surgery. Following consultation, the decision has been made to pursue surgical resection. Patient has been scheduled for an BREAST BIOPSY WITH RADIO FREQUENCY LOCALIZER (Right: Breast) on 10/13/2022 with Dr. Carolan Shiver, MD.  Given patient's past medical history significant for cardiovascular and cardiopulmonary diagnoses, presurgical clearances have been sought from patient's primary cardiologist and pulmonary medicine providers.  Specialty clearances were obtained as follows.  Per pulmonary medicine Meredeth Ide, MD), "right breast mass for resection (atypical ductal hyperplasia of breast). Pulmonary wise is LOW to MODERATE risk. Patient is aware that she is cleared for the procedure".  Per cardiology Darrold Junker, MD), "this patient is optimized for surgery and may proceed with the planned procedural course with an ACCEPTABLE risk of significant perioperative cardiovascular complications".  In  review of her medication reconciliation, the patient is not noted to be taking any type of anticoagulation or antiplatelet therapies that would need to be held during her perioperative course.  Patient denies previous perioperative complications with anesthesia in the past. In review of the available records, it is noted that patient underwent a general anesthetic course here at [facility] in 09/2022 without documented complications.      10/05/2022    1:00 PM 09/15/2022    8:12 AM 09/15/2022   12:06 AM  Vitals with BMI  Height 5\' 1"     Weight 106 lbs 1 oz    BMI 20.05    Systolic  110 105  Diastolic  99 44  Pulse  75 70    Providers/Specialists:   NOTE: Primary physician provider listed below. Patient may have been seen by APP or partner within same practice.   PROVIDER ROLE / SPECIALTY LAST Beverely Pace, MD General Surgery (Surgeon) 09/29/2022  Barbette Reichmann, MD Primary Care Provider 06/23/2022  Marcina Millard, MD Cardiology 08/28/2022  Ned Clines, MD Pulmonary Medicine 08/22/2022   Allergies:  Aspirin, Ropinirole, Ferrous fumarate, Ferrous sulfate, Omeprazole, Oxycodone, and Prednisone  Current Home Medications:   No current facility-administered medications for this encounter.    acetaminophen (TYLENOL) 500 MG tablet   albuterol (PROVENTIL HFA;VENTOLIN HFA) 108 (90 Base) MCG/ACT inhaler   albuterol (PROVENTIL) (2.5 MG/3ML) 0.083% nebulizer solution   ALPRAZolam (XANAX) 0.5 MG tablet   budesonide-formoterol (SYMBICORT) 160-4.5 MCG/ACT inhaler   cyanocobalamin 1000 MCG tablet   escitalopram (LEXAPRO) 10 MG tablet   fluticasone (FLONASE) 50 MCG/ACT nasal spray   furosemide (LASIX) 20 MG tablet   gabapentin (NEURONTIN) 300 MG capsule   lisinopril (PRINIVIL,ZESTRIL) 2.5 MG tablet   loperamide (IMODIUM) 2 MG capsule   OXYGEN   pantoprazole (PROTONIX) 40 MG tablet   SPIRIVA RESPIMAT 2.5 MCG/ACT AERS    traMADol (ULTRAM) 50  MG tablet   Vitamin D, Ergocalciferol, (DRISDOL) 50000 units CAPS capsule   History:   Past Medical History:  Diagnosis Date   Acute cholecystitis    Adenomatous polyp of colon    Allergic rhinitis    Anxiety    a.) on BZO PRN (alprazolam)   Aortic atherosclerosis (HCC)    Atypical ductal hyperplasia of breast    Centrilobular emphysema (HCC)    Chronic systolic CHF (congestive heart failure) (HCC)    a.) TTE 03/31/2015: EF 20-25%, diffuse HK, mod LAE, sev LVE, mild RAE/RVE, mod TR; b.) MV 05/14/2015: EF 32%; c.) TTE 12/12/2016: EF 45%, no RWMAs, norm RVSF, mild MR/TR; d.) TTE 08/24/2022: EF 40%, sep HK, G1DD, norm RVSF, triv AR/TR, mild MR; e.) MV 08/24/2022: EF 49%   Colon polyposis    COPD (chronic obstructive pulmonary disease) (HCC)    Coronary artery disease involving native coronary artery of native heart without angina pectoris    a.) LHC 07/06/2008: 20% mLAD, 95% pRCA (3.5 x 18 mm Vision BMS); b.) MV 05/14/2015: no ischemia; c.) MV 08/24/2022: no ischemia   Cortical senile cataract    DDD (degenerative disc disease), lumbar    Depression    Dilated cardiomyopathy (HCC)    a.) TTE 03/31/2015: EF 20-25%; b.) MV 05/14/2015: EF 32%; c.) TTE 12/12/2016: EF 45%; d.) TTE 08/24/2022: EF 40%; e.) MV 08/24/2022: EF 49%   Dyspnea    Former smoker    GERD (gastroesophageal reflux disease)    HLD (hyperlipidemia)    Hypertension    IDA (iron deficiency anemia)    Levoscoliosis of thoracic spine    Low serum vitamin D    Lumbar radiculitis    Lumbar spondylitis (HCC)    Lumbar stenosis with neurogenic claudication    Melena    Osteoarthritis    PUD (peptic ulcer disease)    Pulmonary nodule    Restless leg    Status post left rotator cuff repair    Supplemental oxygen dependent    a.) 2L/[place] continuous   Unintentional weight loss    Vitamin B12 deficiency    Past Surgical History:  Procedure Laterality Date   ABDOMINAL HYSTERECTOMY     APPENDECTOMY      BREAST BIOPSY Right 11/20/2011   neg [place] core   BREAST BIOPSY Right 07/21/2022   [place] Bx, Venus clip, path pending   BREAST BIOPSY Right 07/21/2022   [place] BX, Heart Clip, path pending   BREAST BIOPSY Right 07/21/2022   [place] RT BREAST BX W LOC DEV EA ADD LESION IMG BX SPEC US GUIDE 07/21/2022 [facility]   BREAST BIOPSY Right 07/21/2022   [place] RT BREAST BX W LOC DEV 1ST LESION IMG BX SPEC US GUIDE 07/21/2022 [facility]   BREAST BIOPSY Right 10/05/2022   MM RT RADIO FREQUENCY TAG LOC MAMMO GUIDE 10/05/2022 [facility]   CHOLECYSTECTOMY  09/14/2022   COLECTOMY     COLONOSCOPY WITH PROPOFOL N/A 12/08/2019   Procedure: COLONOSCOPY WITH PROPOFOL;  Surgeon: Regis Bill, MD;  Location: [facility] ENDOSCOPY;  Service: Endoscopy;  Laterality: N/A;   CORONARY ANGIOPLASTY WITH STENT PLACEMENT     ESOPHAGOGASTRODUODENOSCOPY (EGD) WITH PROPOFOL N/A 12/08/2019   Procedure: ESOPHAGOGASTRODUODENOSCOPY (EGD) WITH PROPOFOL;  Surgeon: Regis Bill, MD;  Location: [facility] ENDOSCOPY;  Service: Endoscopy;  Laterality: N/A;   TONSILLECTOMY     Family History  Problem Relation Age of Onset   Heart disease Sister    Cancer Brother        prostate  cancer   Heart disease Brother    Social History   Tobacco Use   Smoking status: Former    Current packs/day: 0.00    Average packs/day: 1.5 packs/day for 52.5 years (78.8 ttl pk-yrs)    Types: Cigarettes    Start date: 05/10/1941    Quit date: 11/09/1993    Years since quitting: 28.9   Smokeless tobacco: Never  Vaping Use   Vaping status: Never Used  Substance Use Topics   Alcohol use: No    Alcohol/week: 0.0 standard drinks of alcohol   Drug use: No    Pertinent Clinical Results:  LABS:   No visits with results within 3 Day(s) from this visit.  Latest known visit with results is:  Admission on 09/14/2022, Discharged on 09/15/2022  Component Date Value Ref Range Status   Lipase 09/14/2022 34  11 - 51 U/L Final   Performed at [facility], [address] [place], [place] [address]   Sodium 09/14/2022 128 (L)  135 - 145 mmol/L Final   Potassium 09/14/2022 3.9  3.5 - 5.1 mmol/L Final   Chloride 09/14/2022 93 (L)  98 - 111 mmol/L Final   CO2 09/14/2022 23  22 - 32 mmol/L Final   Glucose, Bld 09/14/2022 154 (H)  70 - 99 mg/dL Final   Glucose reference range applies only to samples taken after fasting for at least 8 hours.   BUN 09/14/2022 18  8 - 23 mg/dL Final   Creatinine, Ser 09/14/2022 0.76  0.44 - 1.00 mg/dL Final   Calcium 34/74/2595 9.0  8.9 - 10.3 mg/dL Final   Total Protein 63/87/5643 7.1  6.5 - 8.1 g/dL Final   Albumin 32/95/1884 4.3  3.5 - 5.0 g/dL Final   AST 16/60/6301 21  15 - 41 U/L Final   ALT 09/14/2022 14  0 - 44 U/L Final   Alkaline Phosphatase 09/14/2022 61  38 - 126 U/L Final   Total Bilirubin 09/14/2022 0.9  0.3 - 1.2 mg/dL Final   GFR, Estimated 09/14/2022 >60  >60 mL/min Final   Comment: (NOTE) Calculated using the CKD-EPI Creatinine Equation (2021)    Anion gap 09/14/2022 12  5 - 15 Final   Performed at [facility], [address] [place], [place] [address]   WBC 09/14/2022 11.3 (H)  4.0 - 10.5 K/uL Final   RBC 09/14/2022 4.63  3.87 - 5.11 MIL/uL Final   Hemoglobin 09/14/2022 13.0  12.0 - 15.0 g/dL Final   HCT 32/35/5732 39.3  36.0 - 46.0 % Final   MCV 09/14/2022 84.9  80.0 - 100.0 fL Final   MCH 09/14/2022 28.1  26.0 - 34.0 pg Final   MCHC 09/14/2022 33.1  30.0 - 36.0 g/dL Final   RDW 20/25/4270 14.2  11.5 - 15.5 % Final   Platelets 09/14/2022 310  150 - 400 K/uL Final   nRBC 09/14/2022 0.0  0.0 - 0.2 % Final   Performed at [facility], [address] [place], [place] [address]   MRSA, PCR 09/14/2022 NEGATIVE  NEGATIVE Final   Staphylococcus aureus 09/14/2022 POSITIVE (A)  NEGATIVE Final   Comment: (NOTE) The Xpert SA Assay (FDA approved for NASAL specimens in patients 16 years of age and older), is one component of a comprehensive surveillance program.  It is not intended to diagnose infection nor to guide or monitor treatment. Performed at [facility], [address] [place], [place] [address]     ECG: Date: 08/10/2022 Time  ECG obtained: 1429 PM Rate: 88 bpm Rhythm:  NSR with nonspecific IVCD Axis (leads I and aVF): Normal Intervals: PR 136 ms. QRS 124 ms. QTc 452 ms. ST segment and T wave changes: Inferior ST and T wave abnormality  Comparison: Similar to previous tracing obtained on 08/06/2015 NOTE: Tracing obtained at [facility]; unable for review. Above based on cardiologist's interpretation.    IMAGING / PROCEDURES: MYOCARDIAL PERFUSION IMAGING STUDY (LEXISCAN) performed on 08/24/2022 Mildly reduced left ventricular systolic function with an EF of 49% No significant regional wall motion abnormality No artifact Left ventricular cavity size enlarged Small reversible perfusion abnormality of mild intensity present in the inferior region on stress images consistent with mild inferior ischemia.  TRANSTHORACIC ECHOCARDIOGRAM performed on 08/24/2022 Mildly reduced left ventricular systolic function with an EF of 40% Septal hypokinesis Left ventricular diastolic Doppler parameters consistent with abnormal relaxation (G1DD). Normal right ventricular size and function Trivial AR and TR Mild MR  CT ANGIO CHEST PULMONARY EMBOLISM (PE) W OR WO CONTRAST performed on 08/10/2021 No evidence of pulmonary embolism. Scattered atherosclerotic calcifications including coronary arteries. Aortic atherosclerosis Emphysema  Impression and Plan:  Tanya Rhodes has been referred for pre-anesthesia review and clearance prior to her undergoing the planned anesthetic and procedural courses. Available labs, pertinent testing, and imaging results were personally reviewed by me in preparation for upcoming operative/procedural course. [facility] medical record has been updated following extensive record review and patient  interview with PAT staff.   This patient has been appropriately cleared by cardiology (ACCEPTABLE) and by pulmonary medicine (LOW - MODERATE) with the individually indicated risk of significant perioperative cardiovascular/cardiopulmonary complications.  Based on clinical review performed today (10/10/22), barring any significant acute changes in the patient's overall condition, it is anticipated that she will be able to proceed with the planned surgical intervention. Any acute changes in clinical condition may necessitate her procedure being postponed and/or cancelled. Patient will meet with anesthesia team (MD and/or CRNA) on the day of her procedure for preoperative evaluation/assessment. Questions regarding anesthetic course will be fielded at that time.   Pre-surgical instructions were reviewed with the patient during her PAT appointment, and questions were fielded to satisfaction by PAT clinical staff. She has been instructed on which medications that she will need to hold prior to surgery, as well as the ones that have been deemed safe/appropriate to take on the day of her procedure. As part of the general education provided by PAT, patient made aware both verbally and in writing, that she would need to abstain from the use of any illegal substances during her perioperative course.  She was advised that failure to follow the provided instructions could necessitate case cancellation or result in serious perioperative complications up to and including death. Patient encouraged to contact PAT and/or her surgeon's office to discuss any questions or concerns that may arise prior to surgery; verbalized understanding.   Quentin Mulling, MSN, APRN, FNP-C, CEN [facility]  Perioperative Services Nurse Practitioner Phone: [phone] Fax: [phone] 10/10/22 10:41 AM  NOTE: This note has been prepared using Dragon dictation software. Despite my best ability to proofread, there is always  the potential that unintentional transcriptional errors may still occur from this process.

## 2022-10-13 ENCOUNTER — Other Ambulatory Visit: Payer: Self-pay | Admitting: Pathology

## 2022-10-13 ENCOUNTER — Other Ambulatory Visit: Payer: Self-pay

## 2022-10-13 ENCOUNTER — Encounter: Payer: Self-pay | Admitting: General Surgery

## 2022-10-13 ENCOUNTER — Ambulatory Visit: Payer: 59 | Admitting: Urgent Care

## 2022-10-13 ENCOUNTER — Encounter
Admission: RE | Disposition: A | Discharge status: Home / Self Care | Payer: Self-pay | Source: Home / Self Care | Attending: General Surgery

## 2022-10-13 ENCOUNTER — Ambulatory Visit
Admission: RE | Admit: 2022-10-13 | Discharge: 2022-10-13 | Disposition: A | Discharge status: Home / Self Care | Payer: 59 | Attending: General Surgery | Admitting: General Surgery

## 2022-10-13 ENCOUNTER — Ambulatory Visit
Admission: RE | Admit: 2022-10-13 | Discharge: 2022-10-13 | Disposition: A | Discharge status: Home / Self Care | Payer: 59 | Source: Ambulatory Visit | Attending: General Surgery | Admitting: General Surgery

## 2022-10-13 ENCOUNTER — Ambulatory Visit
Admission: RE | Admit: 2022-10-13 | Discharge: 2022-10-13 | Disposition: A | Discharge status: Home / Self Care | Payer: 59 | Source: Ambulatory Visit | Attending: Pathology | Admitting: Pathology

## 2022-10-13 DIAGNOSIS — F419 Anxiety disorder, unspecified: Secondary | ICD-10-CM | POA: Diagnosis not present

## 2022-10-13 DIAGNOSIS — Z9981 Dependence on supplemental oxygen: Secondary | ICD-10-CM | POA: Diagnosis not present

## 2022-10-13 DIAGNOSIS — N6021 Fibroadenosis of right breast: Secondary | ICD-10-CM | POA: Diagnosis not present

## 2022-10-13 DIAGNOSIS — I2510 Atherosclerotic heart disease of native coronary artery without angina pectoris: Secondary | ICD-10-CM | POA: Diagnosis not present

## 2022-10-13 DIAGNOSIS — N6091 Unspecified benign mammary dysplasia of right breast: Secondary | ICD-10-CM

## 2022-10-13 DIAGNOSIS — Z87891 Personal history of nicotine dependence: Secondary | ICD-10-CM | POA: Insufficient documentation

## 2022-10-13 DIAGNOSIS — K279 Peptic ulcer, site unspecified, unspecified as acute or chronic, without hemorrhage or perforation: Secondary | ICD-10-CM | POA: Diagnosis not present

## 2022-10-13 DIAGNOSIS — I5022 Chronic systolic (congestive) heart failure: Secondary | ICD-10-CM | POA: Insufficient documentation

## 2022-10-13 DIAGNOSIS — M1990 Unspecified osteoarthritis, unspecified site: Secondary | ICD-10-CM | POA: Insufficient documentation

## 2022-10-13 DIAGNOSIS — E785 Hyperlipidemia, unspecified: Secondary | ICD-10-CM | POA: Insufficient documentation

## 2022-10-13 DIAGNOSIS — I110 Hypertensive heart disease with heart failure: Secondary | ICD-10-CM | POA: Insufficient documentation

## 2022-10-13 DIAGNOSIS — J432 Centrilobular emphysema: Secondary | ICD-10-CM | POA: Diagnosis not present

## 2022-10-13 DIAGNOSIS — I700 Atherosclerosis of aorta: Secondary | ICD-10-CM | POA: Insufficient documentation

## 2022-10-13 DIAGNOSIS — K219 Gastro-esophageal reflux disease without esophagitis: Secondary | ICD-10-CM | POA: Diagnosis not present

## 2022-10-13 DIAGNOSIS — I509 Heart failure, unspecified: Secondary | ICD-10-CM | POA: Insufficient documentation

## 2022-10-13 DIAGNOSIS — I447 Left bundle-branch block, unspecified: Secondary | ICD-10-CM | POA: Insufficient documentation

## 2022-10-13 DIAGNOSIS — N6099 Unspecified benign mammary dysplasia of unspecified breast: Secondary | ICD-10-CM

## 2022-10-13 DIAGNOSIS — E538 Deficiency of other specified B group vitamins: Secondary | ICD-10-CM | POA: Diagnosis not present

## 2022-10-13 DIAGNOSIS — R0602 Shortness of breath: Secondary | ICD-10-CM | POA: Insufficient documentation

## 2022-10-13 DIAGNOSIS — R928 Other abnormal and inconclusive findings on diagnostic imaging of breast: Secondary | ICD-10-CM

## 2022-10-13 HISTORY — DX: Depression, unspecified: F32.A

## 2022-10-13 HISTORY — DX: Left bundle-branch block, unspecified: I44.7

## 2022-10-13 HISTORY — DX: Other forms of scoliosis, thoracic region: M41.84

## 2022-10-13 HISTORY — DX: Dependence on supplemental oxygen: Z99.81

## 2022-10-13 HISTORY — DX: Other hemorrhoids: K64.8

## 2022-10-13 HISTORY — PX: BREAST BIOPSY WITH RADIO FREQUENCY LOCALIZER: SHX6895

## 2022-10-13 HISTORY — DX: Benign neoplasm of colon, unspecified: D12.6

## 2022-10-13 HISTORY — DX: Other specified postprocedural states: Z98.890

## 2022-10-13 HISTORY — DX: Cortical age-related cataract, unspecified eye: H25.019

## 2022-10-13 HISTORY — DX: Abnormal weight loss: R63.4

## 2022-10-13 HISTORY — DX: Unspecified osteoarthritis, unspecified site: M19.90

## 2022-10-13 SURGERY — BREAST BIOPSY WITH RADIO FREQUENCY LOCALIZER
Anesthesia: General | Site: Breast | Laterality: Right

## 2022-10-13 MED ORDER — CEFAZOLIN SODIUM-DEXTROSE 2-4 GM/100ML-% IV SOLN
INTRAVENOUS | Status: AC
  Filled 2022-10-13: qty 100

## 2022-10-13 MED ORDER — BUPIVACAINE-EPINEPHRINE (PF) 0.5% -1:200000 IJ SOLN
INTRAMUSCULAR | Status: AC
  Filled 2022-10-13: qty 10

## 2022-10-13 MED ORDER — BUPIVACAINE-EPINEPHRINE (PF) 0.5% -1:200000 IJ SOLN
INTRAMUSCULAR | Status: DC | PRN
  Administered 2022-10-13: 5 mL via PERINEURAL

## 2022-10-13 MED ORDER — DEXAMETHASONE SODIUM PHOSPHATE 10 MG/ML IJ SOLN
INTRAMUSCULAR | Status: AC
  Filled 2022-10-13: qty 1

## 2022-10-13 MED ORDER — BUPIVACAINE-EPINEPHRINE (PF) 0.5% -1:200000 IJ SOLN
INTRAMUSCULAR | Status: AC
  Filled 2022-10-13: qty 20

## 2022-10-13 MED ORDER — STERILE WATER FOR IRRIGATION IR SOLN
Status: DC | PRN
  Administered 2022-10-13: 500 mL

## 2022-10-13 MED ORDER — CHLORHEXIDINE GLUCONATE 0.12 % MT SOLN
OROMUCOSAL | Status: AC
  Filled 2022-10-13: qty 15

## 2022-10-13 MED ORDER — FENTANYL CITRATE (PF) 100 MCG/2ML IJ SOLN: 25.0000 ug | INTRAMUSCULAR | Status: DC | PRN

## 2022-10-13 MED ORDER — PROPOFOL 10 MG/ML IV BOLUS
INTRAVENOUS | Status: AC
  Filled 2022-10-13: qty 20

## 2022-10-13 MED ORDER — CHLORHEXIDINE GLUCONATE 0.12 % MT SOLN
15.0000 mL | Freq: Once | OROMUCOSAL | Status: AC
  Administered 2022-10-13: 15 mL via OROMUCOSAL

## 2022-10-13 MED ORDER — DEXAMETHASONE SODIUM PHOSPHATE 10 MG/ML IJ SOLN
INTRAMUSCULAR | Status: DC | PRN
Start: 2022-10-13 — End: 2022-10-13
  Administered 2022-10-13: 5 mg via INTRAVENOUS

## 2022-10-13 MED ORDER — ONDANSETRON HCL 4 MG/2ML IJ SOLN
INTRAMUSCULAR | Status: AC
  Filled 2022-10-13: qty 2

## 2022-10-13 MED ORDER — CEFAZOLIN SODIUM-DEXTROSE 2-4 GM/100ML-% IV SOLN
2.0000 g | INTRAVENOUS | Status: AC
  Administered 2022-10-13: 2 g via INTRAVENOUS

## 2022-10-13 MED ORDER — LIDOCAINE HCL (PF) 2 % IJ SOLN
INTRAMUSCULAR | Status: DC | PRN
Start: 2022-10-13 — End: 2022-10-13
  Administered 2022-10-13: 60 mg via INTRADERMAL

## 2022-10-13 MED ORDER — FENTANYL CITRATE (PF) 100 MCG/2ML IJ SOLN
INTRAMUSCULAR | Status: DC | PRN
Start: 2022-10-13 — End: 2022-10-13
  Administered 2022-10-13: 25 ug via INTRAVENOUS

## 2022-10-13 MED ORDER — LIDOCAINE HCL (PF) 2 % IJ SOLN
INTRAMUSCULAR | Status: AC
  Filled 2022-10-13: qty 5

## 2022-10-13 MED ORDER — ONDANSETRON HCL 4 MG/2ML IJ SOLN
INTRAMUSCULAR | Status: DC | PRN
Start: 2022-10-13 — End: 2022-10-13
  Administered 2022-10-13: 4 mg via INTRAVENOUS

## 2022-10-13 MED ORDER — LACTATED RINGERS IV SOLN: INTRAVENOUS | Status: DC

## 2022-10-13 MED ORDER — ORAL CARE MOUTH RINSE: 15.0000 mL | Freq: Once | OROMUCOSAL | Status: AC

## 2022-10-13 MED ORDER — EPHEDRINE SULFATE (PRESSORS) 50 MG/ML IJ SOLN
INTRAMUSCULAR | Status: DC | PRN
Start: 2022-10-13 — End: 2022-10-13
  Administered 2022-10-13: 5 mg via INTRAVENOUS

## 2022-10-13 MED ORDER — FENTANYL CITRATE (PF) 100 MCG/2ML IJ SOLN
INTRAMUSCULAR | Status: AC
  Filled 2022-10-13: qty 2

## 2022-10-13 MED ORDER — EPHEDRINE 5 MG/ML INJ
INTRAVENOUS | Status: AC
  Filled 2022-10-13: qty 5

## 2022-10-13 MED ORDER — PROPOFOL 10 MG/ML IV BOLUS
INTRAVENOUS | Status: DC | PRN
Start: 2022-10-13 — End: 2022-10-13
  Administered 2022-10-13: 80 mg via INTRAVENOUS

## 2022-10-13 SURGICAL SUPPLY — 41 items
ADH SKN CLS APL DERMABOND .7 (GAUZE/BANDAGES/DRESSINGS) ×1
APL PRP STRL LF DISP 70% ISPRP (MISCELLANEOUS) ×1
BLADE SURG 15 STRL LF DISP TIS (BLADE) ×1 IMPLANT
BLADE SURG 15 STRL SS (BLADE) ×1
CHLORAPREP W/TINT 26 (MISCELLANEOUS) ×1 IMPLANT
CNTNR URN SCR LID CUP LEK RST (MISCELLANEOUS) ×1 IMPLANT
CONT SPEC 4OZ STRL OR WHT (MISCELLANEOUS) ×1
DERMABOND ADVANCED .7 DNX12 (GAUZE/BANDAGES/DRESSINGS) ×1 IMPLANT
DEVICE DUBIN SPECIMEN MAMMOGRA (MISCELLANEOUS) ×1 IMPLANT
DRAPE LAPAROTOMY TRNSV 106X77 (MISCELLANEOUS) ×1 IMPLANT
ELECT CAUTERY BLADE TIP 2.5 (TIP) ×1
ELECT REM PT RETURN 9FT ADLT (ELECTROSURGICAL) ×1
ELECTRODE CAUTERY BLDE TIP 2.5 (TIP) ×1 IMPLANT
ELECTRODE REM PT RTRN 9FT ADLT (ELECTROSURGICAL) ×1 IMPLANT
GLOVE BIO SURGEON STRL SZ 6.5 (GLOVE) ×1 IMPLANT
GLOVE BIOGEL PI IND STRL 6.5 (GLOVE) ×1 IMPLANT
GOWN STRL REUS W/ TWL LRG LVL3 (GOWN DISPOSABLE) ×3 IMPLANT
GOWN STRL REUS W/TWL LRG LVL3 (GOWN DISPOSABLE) ×3
KIT MARKER MARGIN INK (KITS) IMPLANT
KIT TURNOVER KIT A (KITS) ×1 IMPLANT
LABEL OR SOLS (LABEL) ×1 IMPLANT
MANIFOLD NEPTUNE II (INSTRUMENTS) ×1 IMPLANT
MARKER MARGIN CORRECT CLIP (MARKER) IMPLANT
NDL HYPO 25X1 1.5 SAFETY (NEEDLE) ×1 IMPLANT
NEEDLE HYPO 25X1 1.5 SAFETY (NEEDLE) ×1 IMPLANT
PACK BASIN MINOR ARMC (MISCELLANEOUS) ×1 IMPLANT
RETRACTOR RING XSMALL (MISCELLANEOUS) IMPLANT
RTRCTR WOUND ALEXIS 13CM XS SH (MISCELLANEOUS) ×1
SHEATH BREAST BIOPSY SKIN MKR (SHEATH) ×1 IMPLANT
SUT MNCRL 4-0 (SUTURE) ×1
SUT MNCRL 4-0 27XMFL (SUTURE) ×1
SUT SILK 2 0 SH (SUTURE) ×1 IMPLANT
SUT VIC AB 3-0 SH 27 (SUTURE) ×1
SUT VIC AB 3-0 SH 27X BRD (SUTURE) ×1 IMPLANT
SUTURE MNCRL 4-0 27XMF (SUTURE) ×1 IMPLANT
SYR 10ML LL (SYRINGE) ×1 IMPLANT
SYR BULB IRRIG 60ML STRL (SYRINGE) ×1 IMPLANT
TRAP FLUID SMOKE EVACUATOR (MISCELLANEOUS) ×1 IMPLANT
TRAP NEPTUNE SPECIMEN COLLECT (MISCELLANEOUS) ×1 IMPLANT
WATER STERILE IRR 1000ML POUR (IV SOLUTION) ×1 IMPLANT
WATER STERILE IRR 500ML POUR (IV SOLUTION) ×1 IMPLANT

## 2022-10-13 NOTE — Anesthesia Procedure Notes (Signed)
Procedure Name: LMA Insertion Date/Time: 10/13/2022 9:35 AM  Performed by: Monico Hoar, CRNAPre-anesthesia Checklist: Patient identified, Patient being monitored, Timeout performed, Emergency Drugs available and Suction available Patient Re-evaluated:Patient Re-evaluated prior to induction Oxygen Delivery Method: Circle system utilized Preoxygenation: Pre-oxygenation with 100% oxygen Induction Type: IV induction Ventilation: Mask ventilation without difficulty LMA: LMA inserted LMA Size: 3.0 Number of attempts: 1 Placement Confirmation: breath sounds checked- equal and bilateral and positive ETCO2 Tube secured with: Tape Dental Injury: Teeth and Oropharynx as per pre-operative assessment

## 2022-10-13 NOTE — Anesthesia Preprocedure Evaluation (Signed)
Anesthesia Evaluation  Patient identified by MRN, date of birth, ID band Patient awake    Reviewed: Allergy & Precautions, NPO status , Patient's Chart, lab work & pertinent test results  History of Anesthesia Complications Negative for: history of anesthetic complications  Airway Mallampati: III  TM Distance: <3 FB Neck ROM: full    Dental  (+) Missing   Pulmonary shortness of breath, COPD (2L),  oxygen dependent, former smoker   Pulmonary exam normal        Cardiovascular Exercise Tolerance: Good hypertension, + CAD and +CHF  Normal cardiovascular exam+ dysrhythmias      Neuro/Psych  PSYCHIATRIC DISORDERS       Neuromuscular disease    GI/Hepatic Neg liver ROS, PUD,GERD  Controlled,,  Endo/Other  negative endocrine ROS    Renal/GU      Musculoskeletal   Abdominal   Peds  Hematology negative hematology ROS (+)   Anesthesia Other Findings Past Medical History: No date: Acute cholecystitis     Comment:  a.) s/p robot assisted laparoscopic cholecystectomy               09/14/2022 No date: Adenomatous polyp of colon No date: Allergic rhinitis No date: Anxiety     Comment:  a.) on BZO PRN (alprazolam) No date: Aortic atherosclerosis (HCC) 07/21/2022: Atypical ductal hyperplasia of right breast     Comment:  a.) CNB 07/21/2022 --> pathology (+) for ER/PR + ADH No date: Centrilobular emphysema (HCC) No date: Chronic systolic CHF (congestive heart failure) (HCC)     Comment:  a.) TTE 03/31/2015: EF 20-25%, diffuse HK, mod LAE, sev               LVE, mild RAE/RVE, mod TR; b.) MV 05/14/2015: EF 32%; c.)              TTE 12/12/2016: EF 45%, no RWMAs, norm RVSF, mild MR/TR;               d.) TTE 08/24/2022: EF 40%, sep HK, G1DD, norm RVSF, triv              AR/TR, mild MR; e.) MV 08/24/2022: EF 49% No date: Colon polyposis     Comment:  a.) s/p partial colectomy 2013 No date: COPD (chronic obstructive pulmonary  disease) (HCC) No date: Coronary artery disease involving native coronary artery of  native heart without angina pectoris     Comment:  a.) LHC 07/06/2008: 20% mLAD, 95% pRCA (3.5 x 18 mm               Vision BMS); b.) MV 05/14/2015: no ischemia; c.) MV               08/24/2022: small reversible perfusion abnormality in the              inferior region consistent with mild inferior ischemia. No date: Cortical senile cataract No date: DDD (degenerative disc disease), lumbar No date: Depression No date: Dilated cardiomyopathy (HCC)     Comment:  a.) TTE 03/31/2015: EF 20-25%; b.) MV 05/14/2015: EF               32%; c.) TTE 12/12/2016: EF 45%; d.) TTE 08/24/2022: EF               40%; e.) MV 08/24/2022: EF 49% No date: Dyspnea No date: Former smoker No date: GERD (gastroesophageal reflux disease) No date: HLD (hyperlipidemia) No date: Hypertension No date: IDA (iron deficiency anemia) No date: Internal  hemorrhoids No date: LBBB (left bundle branch block) No date: Levoscoliosis of thoracic spine No date: Low serum vitamin D No date: Lumbar radiculitis No date: Lumbar spondylitis (HCC) No date: Lumbar stenosis with neurogenic claudication No date: Melena No date: Osteoarthritis No date: PUD (peptic ulcer disease) No date: Pulmonary nodule No date: Restless leg No date: Status post left rotator cuff repair No date: Supplemental oxygen dependent     Comment:  a.) 2L/[place] continuous No date: Unintentional weight loss No date: Vitamin B12 deficiency  Past Surgical History: No date: ABDOMINAL HYSTERECTOMY No date: APPENDECTOMY 11/20/2011: BREAST BIOPSY; Right     Comment:  neg [place] core 07/21/2022: BREAST BIOPSY; Right     Comment:  [place] Bx, Venus clip --> pathology (+) for ADH and               fibrocystic changes 07/21/2022: BREAST BIOPSY; Right     Comment:  [place] BX, Heart Clip --> pathology revealed fibrocystic               changes 07/21/2022: BREAST BIOPSY; Right     Comment:   [place] RT BREAST BX W LOC DEV EA ADD LESION IMG BX SPEC [place]               GUIDE 07/21/2022 [facility] 07/21/2022: BREAST BIOPSY; Right     Comment:  [place] RT BREAST BX W LOC DEV 1ST LESION IMG BX SPEC [place]               GUIDE 07/21/2022 [facility] 10/05/2022: BREAST BIOPSY; Right     Comment:  MM RT RADIO FREQUENCY TAG LOC MAMMO GUIDE 10/05/2022               [facility] 12/08/2019: COLONOSCOPY WITH PROPOFOL; N/A     Comment:  Procedure: COLONOSCOPY WITH PROPOFOL;  Surgeon:               Regis Bill, MD;  Location: [facility];                Service: Endoscopy;  Laterality: N/A; 07/06/2008: CORONARY ANGIOPLASTY WITH STENT PLACEMENT; Left     Comment:  Procedure: CORONARY ANGIOPLASTY WITH STENT PLACEMENT;               Location: [facility]; Surgeon: Marcina Millard, MD 12/08/2019: ESOPHAGOGASTRODUODENOSCOPY (EGD) WITH PROPOFOL; N/A     Comment:  Procedure: ESOPHAGOGASTRODUODENOSCOPY (EGD) WITH               PROPOFOL;  Surgeon: Regis Bill, MD;  Location:               [facility];  Service: Endoscopy;  Laterality: N/A; 2013: PARTIAL COLECTOMY; N/A 09/14/2022: ROBOTIC ASSISTED LAPAROSCOPIC CHOLECYSTECTOMY; N/A No date: TONSILLECTOMY     Reproductive/Obstetrics negative OB ROS                             Anesthesia Physical Anesthesia Plan  ASA: 4  Anesthesia Plan: General LMA   Post-op Pain Management:    Induction: Intravenous  PONV Risk Score and Plan: Dexamethasone, Ondansetron, Midazolam and Treatment may vary due to age or medical condition  Airway Management Planned: LMA  Additional Equipment:   Intra-op Plan:   Post-operative Plan: Possible Post-op intubation/ventilation and Extubation in OR  Informed Consent: I have reviewed the patients History and Physical, chart, labs and discussed the procedure including the risks, benefits and alternatives for the proposed anesthesia with the patient or  authorized  representative who has indicated his/her understanding and acceptance.     Dental Advisory Given  Plan Discussed with: Anesthesiologist, CRNA and Surgeon  Anesthesia Plan Comments: (Patient consented for risks of anesthesia including but not limited to:  - adverse reactions to medications - damage to eyes, teeth, lips or other oral mucosa - nerve damage due to positioning  - sore throat or hoarseness - Damage to heart, brain, nerves, lungs, other parts of body or loss of life  Patient voiced understanding.)       Anesthesia Quick Evaluation

## 2022-10-13 NOTE — Transfer of Care (Signed)
Immediate Anesthesia Transfer of Care Note  Patient: CARMITA BOOM  Procedure(s) Performed: BREAST BIOPSY WITH RADIO FREQUENCY LOCALIZER (Right: Breast)  Patient Location: PACU  Anesthesia Type:General  Level of Consciousness: unresponsive  Airway & Oxygen Therapy: Patient Spontanous Breathing and Patient connected to nasal cannula oxygen  Post-op Assessment: Report given to RN and Post -op Vital signs reviewed and stable  Post vital signs: Reviewed and stable  Last Vitals:  Vitals Value Taken Time  BP 153/51 10/13/22 1015  Temp    Pulse 71 10/13/22 1016  Resp 13 10/13/22 1016  SpO2 100 % 10/13/22 1016  Vitals shown include unfiled device data.  Last Pain:  Vitals:   10/13/22 0826  TempSrc: Temporal  PainSc: 0-No pain         Complications: No notable events documented.

## 2022-10-13 NOTE — Anesthesia Postprocedure Evaluation (Signed)
Anesthesia Post Note  Patient: Tanya Rhodes  Procedure(s) Performed: BREAST BIOPSY WITH RADIO FREQUENCY LOCALIZER (Right: Breast)  Patient location during evaluation: PACU Anesthesia Type: General Level of consciousness: awake and alert Pain management: pain level controlled Vital Signs Assessment: post-procedure vital signs reviewed and stable Respiratory status: spontaneous breathing, nonlabored ventilation, respiratory function stable and patient connected to nasal cannula oxygen Cardiovascular status: blood pressure returned to baseline and stable Postop Assessment: no apparent nausea or vomiting Anesthetic complications: no   No notable events documented.   Last Vitals:  Vitals:   10/13/22 1045 10/13/22 1102  BP: (!) 131/97 (!) 129/48  Pulse: 77 77  Resp: 15 16  Temp: 36.6 C 36.5 C  SpO2: 100% 100%    Last Pain:  Vitals:   10/13/22 1102  TempSrc: Temporal  PainSc: 2                  Cleda Mccreedy Hershell Brandl

## 2022-10-13 NOTE — Discharge Instructions (Addendum)
?  Diet: Resume home heart healthy regular diet.  ? ?Activity: No heavy lifting >20 pounds (children, pets, laundry, garbage) or strenuous activity until follow-up, but light activity and walking are encouraged. Do not drive or drink alcohol if taking narcotic pain medications. ? ?Wound care: May shower with soapy water and pat dry (do not rub incisions), but no baths or submerging incision underwater until follow-up. (no swimming)  ? ?Medications: Resume all home medications. For mild to moderate pain: acetaminophen (Tylenol) or ibuprofen (if no kidney disease). Combining Tylenol with alcohol can substantially increase your risk of causing liver disease. Narcotic pain medications, if prescribed, can be used for severe pain, though may cause nausea, constipation, and drowsiness. If you do not need the narcotic pain medication, you do not need to fill the prescription. ? ?Call office ([phone]) at any time if any questions, worsening pain, fevers/chills, bleeding, drainage from incision site, or other concerns. ? ? ?AMBULATORY SURGERY  ?DISCHARGE INSTRUCTIONS ? ? ?The drugs that you were given will stay in your system until tomorrow so for the next 24 hours you should not: ? ?Drive an automobile ?Make any legal decisions ?Drink any alcoholic beverage ? ? ?You may resume regular meals tomorrow.  Today it is better to start with liquids and gradually work up to solid foods. ? ?You may eat anything you prefer, but it is better to start with liquids, then soup and crackers, and gradually work up to solid foods. ? ? ?Please notify your doctor immediately if you have any unusual bleeding, trouble breathing, redness and pain at the surgery site, drainage, fever, or pain not relieved by medication. ? ? ? ?Additional Instructions: ? ? ? ? ? ? ? ?Please contact your physician with any problems or Same Day Surgery at [phone], Monday through Friday 6 am to 4 pm, or [facility] number at [phone].  ?

## 2022-10-13 NOTE — Op Note (Signed)
Preoperative diagnosis: Right breast atypical ductal hyperplasia.  Postoperative diagnosis: Same.   Procedure: Right radiofrequency tag-localized excisional biopsy.                      Anesthesia: GETA  Surgeon: Dr. Hazle Quant  Wound Classification: Clean  Indications: Patient is a 82 y.o. female with a nonpalpable right breast mass noted on mammography with core biopsy demonstrating atypical ductal hyperplasia requires radiofrequency tag-localized excisional biopsy to rule out malignancy.   Findings: 1. Specimen mammography shows marker and tag on specimen   Description of procedure: Preoperative radiofrequency tag localization was performed by radiology. The patient was taken to the operating room and placed supine on the operating table, and after general anesthesia the right chest was prepped and draped in the usual sterile fashion. A time-out was completed verifying correct patient, procedure, site, positioning, and implant(s) and/or special equipment prior to beginning this procedure.  By comparing the localization studies and interrogation with Localizer device, the probable trajectory and location of the mass was visualized. A circumareolar skin incision was planned in such a way as to minimize the amount of dissection to reach the mass.  The skin incision was made. Flaps were raised and the location of the tag was confirmed with Localizer device confirmed. A 2-0 silk figure-of-eight stay suture was placed and used for retraction. Dissection was then taken down circumferentially, taking care to include the entire localizing tag and a wide margin of grossly normal tissue. The specimen and entire localizing tag were removed. The specimen was oriented and sent to radiology with the localization studies. Confirmation was received that the entire target lesion had been resected. The wound was irrigated. Hemostasis was checked. The wound was closed with interrupted sutures of 3-0 Vicryl and  a subcuticular suture of Monocryl 3-0. No attempt was made to close the dead space.    Specimen: Right excisional biopsy                      Complications: None  Estimated Blood Loss: 5 mL

## 2022-10-13 NOTE — Interval H&P Note (Signed)
History and Physical Interval Note:  10/13/2022 9:03 AM  Tanya Rhodes  has presented today for surgery, with the diagnosis of N600.91 Atypical ductal hyperplasia of Rt breast.  The various methods of treatment have been discussed with the patient and family. After consideration of risks, benefits and other options for treatment, the patient has consented to  Procedure(s): BREAST BIOPSY WITH RADIO FREQUENCY LOCALIZER (Right) as a surgical intervention.  The patient's history has been reviewed, patient examined, no change in status, stable for surgery.  I have reviewed the patient's chart and labs.  Questions were answered to the patient's satisfaction.     Carolan Shiver

## 2022-10-14 ENCOUNTER — Encounter: Payer: Self-pay | Admitting: General Surgery

## 2022-10-16 LAB — SURGICAL PATHOLOGY

## 2022-10-30 ENCOUNTER — Inpatient Hospital Stay: Payer: 59 | Attending: Oncology

## 2022-10-30 DIAGNOSIS — D509 Iron deficiency anemia, unspecified: Secondary | ICD-10-CM | POA: Insufficient documentation

## 2022-10-30 DIAGNOSIS — Z7951 Long term (current) use of inhaled steroids: Secondary | ICD-10-CM | POA: Insufficient documentation

## 2022-10-30 DIAGNOSIS — J432 Centrilobular emphysema: Secondary | ICD-10-CM | POA: Insufficient documentation

## 2022-10-30 DIAGNOSIS — Z860101 Personal history of adenomatous and serrated colon polyps: Secondary | ICD-10-CM | POA: Diagnosis not present

## 2022-10-30 DIAGNOSIS — R920 Mammographic microcalcification found on diagnostic imaging of breast: Secondary | ICD-10-CM | POA: Insufficient documentation

## 2022-10-30 DIAGNOSIS — Z87891 Personal history of nicotine dependence: Secondary | ICD-10-CM | POA: Insufficient documentation

## 2022-10-30 DIAGNOSIS — Z23 Encounter for immunization: Secondary | ICD-10-CM | POA: Insufficient documentation

## 2022-10-30 DIAGNOSIS — Z79899 Other long term (current) drug therapy: Secondary | ICD-10-CM | POA: Insufficient documentation

## 2022-10-30 DIAGNOSIS — K219 Gastro-esophageal reflux disease without esophagitis: Secondary | ICD-10-CM | POA: Insufficient documentation

## 2022-10-30 DIAGNOSIS — Z8711 Personal history of peptic ulcer disease: Secondary | ICD-10-CM | POA: Diagnosis not present

## 2022-10-30 DIAGNOSIS — Z9981 Dependence on supplemental oxygen: Secondary | ICD-10-CM | POA: Insufficient documentation

## 2022-10-30 DIAGNOSIS — Z8042 Family history of malignant neoplasm of prostate: Secondary | ICD-10-CM | POA: Insufficient documentation

## 2022-10-30 DIAGNOSIS — E785 Hyperlipidemia, unspecified: Secondary | ICD-10-CM | POA: Insufficient documentation

## 2022-10-30 DIAGNOSIS — M1990 Unspecified osteoarthritis, unspecified site: Secondary | ICD-10-CM | POA: Diagnosis not present

## 2022-10-30 DIAGNOSIS — I5022 Chronic systolic (congestive) heart failure: Secondary | ICD-10-CM | POA: Diagnosis not present

## 2022-10-30 DIAGNOSIS — I2510 Atherosclerotic heart disease of native coronary artery without angina pectoris: Secondary | ICD-10-CM | POA: Diagnosis not present

## 2022-10-30 DIAGNOSIS — N6099 Unspecified benign mammary dysplasia of unspecified breast: Secondary | ICD-10-CM | POA: Diagnosis not present

## 2022-10-30 DIAGNOSIS — I110 Hypertensive heart disease with heart failure: Secondary | ICD-10-CM | POA: Diagnosis not present

## 2022-10-30 DIAGNOSIS — D500 Iron deficiency anemia secondary to blood loss (chronic): Secondary | ICD-10-CM

## 2022-10-30 LAB — CBC WITH DIFFERENTIAL (CANCER CENTER ONLY)
Abs Immature Granulocytes: 0.01 10*3/uL (ref 0.00–0.07)
Basophils Absolute: 0 10*3/uL (ref 0.0–0.1)
Basophils Relative: 1 %
Eosinophils Absolute: 0.2 10*3/uL (ref 0.0–0.5)
Eosinophils Relative: 5 %
HCT: 34.1 % — ABNORMAL LOW (ref 36.0–46.0)
Hemoglobin: 11.2 g/dL — ABNORMAL LOW (ref 12.0–15.0)
Immature Granulocytes: 0 %
Lymphocytes Relative: 40 %
Lymphs Abs: 1.9 10*3/uL (ref 0.7–4.0)
MCH: 28.5 pg (ref 26.0–34.0)
MCHC: 32.8 g/dL (ref 30.0–36.0)
MCV: 86.8 fL (ref 80.0–100.0)
Monocytes Absolute: 0.4 10*3/uL (ref 0.1–1.0)
Monocytes Relative: 9 %
Neutro Abs: 2.1 10*3/uL (ref 1.7–7.7)
Neutrophils Relative %: 45 %
Platelet Count: 214 10*3/uL (ref 150–400)
RBC: 3.93 MIL/uL (ref 3.87–5.11)
RDW: 13.7 % (ref 11.5–15.5)
WBC Count: 4.7 10*3/uL (ref 4.0–10.5)
nRBC: 0 % (ref 0.0–0.2)

## 2022-10-30 LAB — IRON AND TIBC
Iron: 74 ug/dL (ref 28–170)
Saturation Ratios: 21 % (ref 10.4–31.8)
TIBC: 351 ug/dL (ref 250–450)
UIBC: 277 ug/dL

## 2022-10-30 LAB — FERRITIN: Ferritin: 56 ng/mL (ref 11–307)

## 2022-10-31 ENCOUNTER — Inpatient Hospital Stay (HOSPITAL_BASED_OUTPATIENT_CLINIC_OR_DEPARTMENT_OTHER): Payer: 59 | Admitting: Oncology

## 2022-10-31 ENCOUNTER — Encounter: Payer: Self-pay | Admitting: Oncology

## 2022-10-31 ENCOUNTER — Inpatient Hospital Stay: Payer: 59

## 2022-10-31 VITALS — BP 159/81 | HR 93 | Temp 97.8°F | Resp 18 | Wt 104.0 lb

## 2022-10-31 DIAGNOSIS — N6099 Unspecified benign mammary dysplasia of unspecified breast: Secondary | ICD-10-CM

## 2022-10-31 DIAGNOSIS — D509 Iron deficiency anemia, unspecified: Secondary | ICD-10-CM | POA: Diagnosis not present

## 2022-10-31 DIAGNOSIS — D500 Iron deficiency anemia secondary to blood loss (chronic): Secondary | ICD-10-CM

## 2022-10-31 MED ORDER — SODIUM CHLORIDE 0.9% FLUSH
10.0000 mL | Freq: Once | INTRAVENOUS | Status: AC | PRN
  Administered 2022-10-31: 10 mL
  Filled 2022-10-31: qty 10

## 2022-10-31 MED ORDER — IRON SUCROSE 20 MG/ML IV SOLN
200.0000 mg | Freq: Once | INTRAVENOUS | Status: AC
Start: 2022-10-31 — End: 2022-10-31
  Administered 2022-10-31: 200 mg via INTRAVENOUS
  Filled 2022-10-31: qty 10

## 2022-10-31 MED ORDER — INFLUENZA VAC A&B SURF ANT ADJ 0.5 ML IM SUSY
0.5000 mL | PREFILLED_SYRINGE | Freq: Once | INTRAMUSCULAR | Status: AC
  Administered 2022-10-31: 0.5 mL via INTRAMUSCULAR
  Filled 2022-10-31: qty 0.5

## 2022-10-31 NOTE — Progress Notes (Signed)
Hematology/Oncology Progress note [phone] [phone]   Chief Complaint: Tanya Rhodes is a 82 y.o. female presents to follow-up for iron deficiency anemia, atypical ductal hyperplasia of the breast.  ASSESSMENT & PLAN:   Iron deficiency anemia #History of iron deficiency anemia. Labs are reviewed and discussed with patient. Lab Results  Component Value Date   HGB 11.2 (L) 10/30/2022   TIBC 351 10/30/2022   IRONPCTSAT 21 10/30/2022   FERRITIN 56 10/30/2022     She cannot tolerate oral iron. Recommend IV Venofer x 1 to further improve iron store.   Atypical ductal hyperplasia of breast Atypical ductal hyperplasia is associated with a generalized, bilateral increase in breast cancer risk. S/p surgical excision.  I recommend annual mammogram surveillance.   We discussed about option of chemoprevention.  Given patient's age, multiple other comorbidities, she decision was made not to proceed with chemoprevention.   Orders Placed This Encounter  Procedures   CBC with Differential (Cancer Center Only)    Standing Status:   Future    Standing Expiration Date:   10/31/2023   Iron and TIBC    Standing Status:   Future    Standing Expiration Date:   10/31/2023   Ferritin    Standing Status:   Future    Standing Expiration Date:   10/31/2023   Retic Panel    Standing Status:   Future    Standing Expiration Date:   10/31/2023   Follow up in 6 months.  All questions were answered. The patient knows to call the clinic with any problems, questions or concerns.  Rickard Patience, MD, PhD [facility] Hematology Oncology 10/31/2022    PERTINENT HEMATOLOGY HISTORY Patient was previously followed by Dr. Merlene Pulling.   Patient establish care with me on 05/30/2018 when she had a virtual visit with me. Extensive chart review was performed by me.  Patient had a history of gastric ulcer years ago and a colonic polyp status post partial colectomy in 2013.  Colonoscopy on  05/15/2013 reviewed patent ileocolonic anastomosis and internal hemorrhoids.  EGD on 05/15/2013 was normal.  Patient has iron deficiency and has been receiving IV Venofer if ferritin is less than 30. History of B12 deficiency is taking oral B12 supplementation.   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  Smoking history greater than 30-pack-year.  CT chest angiogram 04/01/2014 shows no pulmonary embolism but a 4 mm left upper lobe pulmonary nodule.  Chest CT to 03/04/18 17 revealed a stable 3 mm lingular nodule.  There were new groundglass opacities in the right upper lobe.  Right middle lobe and right lower lobe suggesting infectious or inflammatory process. CT chest 07/30/2015 revealed a stable 3 mm left lower lobe nodule. Patient follows up with [facility] gastroenterology Dr. Mechele Collin.  EGD and colonoscopy in November 2021. EGD showed esophagitis, hematin in the gastric antrum.  Pathology showed reactive gastropathy with focal ulceration and active inflammation. Colonoscopy showed a diverticulosis.  Nonbleeding internal hemorrhoids.  No specimen was collected. Recommend patient to continue follow-up with gastroenterology.   07/05/2022, bilateral screening mammogram showed right breast mass warrants further evaluation. 07/14/2022 unilateral right diagnostic mammogram showed indeterminate right breast mass at 11 o'clock position 3 cm from nipple.  No suspicious right axillary lymphadenopathy. Patient underwent ultrasound-guided breast biopsy.  Pathology showed 1. Breast, right, needle core biopsy, 10:00 4cmfn, heart clip - FIBROCYSTIC CHANGES INCLUDING CYSTIC DILATATION, COLUMNAR CELL CHANGE AND STROMAL FIBROSIS - NEGATIVE FOR MALIGNANCY 2. Breast, right, needle core biopsy, 11:00 3 cmfn,  venus clip - ATYPICAL DUCTAL HYPERPLASIA (ADH) INVOLVING AN INTRADUCTAL PAPILLOMA WITH FOCAL SCLEROSIS - FIBROCYSTIC CHANGES - SEE NOTE    INTERVAL HISTORY Tanya Rhodes is a 82 y.o. female who has above  history reviewed by me today presents for follow up visit for iron deficiency anemia. Chronic respiriatory failure, follows up with pulmonology.  Chronic shortness of breath. 10/13/2022 Status post right breast lumpectomy. Pathology showed residual atypical ductal hyperplasia.  Margins are negative.  Fibrocystic changes, adenosis and usual ductal hyperplasia.  Focal fibroadenomatoid change with rare associated microcalcification.  Negative for invasive carcinoma.    Past Medical History:  Diagnosis Date   Acute cholecystitis    a.) s/p robot assisted laparoscopic cholecystectomy 09/14/2022   Adenomatous polyp of colon    Allergic rhinitis    Anxiety    a.) on BZO PRN (alprazolam)   Aortic atherosclerosis (HCC)    Atypical ductal hyperplasia of right breast 07/21/2022   a.) CNB 07/21/2022 --> pathology (+) for ER/PR + ADH   Centrilobular emphysema (HCC)    Chronic systolic CHF (congestive heart failure) (HCC)    a.) TTE 03/31/2015: EF 20-25%, diffuse HK, mod LAE, sev LVE, mild RAE/RVE, mod TR; b.) MV 05/14/2015: EF 32%; c.) TTE 12/12/2016: EF 45%, no RWMAs, norm RVSF, mild MR/TR; d.) TTE 08/24/2022: EF 40%, sep HK, G1DD, norm RVSF, triv AR/TR, mild MR; e.) MV 08/24/2022: EF 49%   Colon polyposis    a.) s/p partial colectomy 2013   COPD (chronic obstructive pulmonary disease) (HCC)    Coronary artery disease involving native coronary artery of native heart without angina pectoris    a.) LHC 07/06/2008: 20% mLAD, 95% pRCA (3.5 x 18 mm Vision BMS); b.) MV 05/14/2015: no ischemia; c.) MV 08/24/2022: small reversible perfusion abnormality in the inferior region consistent with mild inferior ischemia.   Cortical senile cataract    DDD (degenerative disc disease), lumbar    Depression    Dilated cardiomyopathy (HCC)    a.) TTE 03/31/2015: EF 20-25%; b.) MV 05/14/2015: EF 32%; c.) TTE 12/12/2016: EF 45%; d.) TTE 08/24/2022: EF 40%; e.) MV 08/24/2022: EF 49%   Dyspnea    Former smoker    GERD  (gastroesophageal reflux disease)    HLD (hyperlipidemia)    Hypertension    IDA (iron deficiency anemia)    Internal hemorrhoids    LBBB (left bundle branch block)    Levoscoliosis of thoracic spine    Low serum vitamin D    Lumbar radiculitis    Lumbar spondylitis (HCC)    Lumbar stenosis with neurogenic claudication    Melena    Osteoarthritis    PUD (peptic ulcer disease)    Pulmonary nodule    Restless leg    Status post left rotator cuff repair    Supplemental oxygen dependent    a.) 2L/[place] continuous   Unintentional weight loss    Vitamin B12 deficiency     Past Surgical History:  Procedure Laterality Date   ABDOMINAL HYSTERECTOMY     APPENDECTOMY     BREAST BIOPSY Right 11/20/2011   neg [place] core   BREAST BIOPSY Right 07/21/2022   [place] Bx, Venus clip --> pathology (+) for ADH and fibrocystic changes   BREAST BIOPSY Right 07/21/2022   [place] BX, Heart Clip --> pathology revealed fibrocystic changes   BREAST BIOPSY Right 07/21/2022   [place] RT BREAST BX W LOC DEV EA ADD LESION IMG BX SPEC US GUIDE 07/21/2022 [facility]   BREAST BIOPSY Right  07/21/2022   [place] RT BREAST BX W LOC DEV 1ST LESION IMG BX SPEC US GUIDE 07/21/2022 [facility]   BREAST BIOPSY Right 10/05/2022   MM RT RADIO FREQUENCY TAG LOC MAMMO GUIDE 10/05/2022 [facility]   BREAST BIOPSY WITH RADIO FREQUENCY LOCALIZER Right 10/13/2022   Procedure: BREAST BIOPSY WITH RADIO FREQUENCY LOCALIZER;  Surgeon: Carolan Shiver, MD;  Location: [facility];  Service: General;  Laterality: Right;   COLONOSCOPY WITH PROPOFOL N/A 12/08/2019   Procedure: COLONOSCOPY WITH PROPOFOL;  Surgeon: Regis Bill, MD;  Location: [facility];  Service: Endoscopy;  Laterality: N/A;   CORONARY ANGIOPLASTY WITH STENT PLACEMENT Left 07/06/2008   Procedure: CORONARY ANGIOPLASTY WITH STENT PLACEMENT; Location: [facility]; Surgeon: Marcina Millard, MD   ESOPHAGOGASTRODUODENOSCOPY (EGD) WITH PROPOFOL N/A 12/08/2019   Procedure:  ESOPHAGOGASTRODUODENOSCOPY (EGD) WITH PROPOFOL;  Surgeon: Regis Bill, MD;  Location: [facility];  Service: Endoscopy;  Laterality: N/A;   PARTIAL COLECTOMY N/A 2013   ROBOTIC ASSISTED LAPAROSCOPIC CHOLECYSTECTOMY N/A 09/14/2022   TONSILLECTOMY      Family History  Problem Relation Age of Onset   Heart disease Sister    Cancer Brother        prostate cancer   Heart disease Brother     Social History:  reports that she quit smoking about 28 years ago. Her smoking use included cigarettes. She started smoking about 81 years ago. She has a 78.8 pack-year smoking history. She has never used smokeless tobacco. She reports that she does not drink alcohol and does not use drugs.  She has a greater than 30 pack year smoking history.  She quit smoking in 1995.    Allergies:  Allergies  Allergen Reactions   Aspirin Other (See Comments)    GIB   Ropinirole Anxiety and Other (See Comments)    Reaction:  Severe leg pain    Ferrous Fumarate Itching   Ferrous Sulfate Diarrhea and Nausea And Vomiting   Omeprazole Nausea And Vomiting   Oxycodone Other (See Comments)    Reaction:  Makes pt hyper/jittery   Prednisone Other (See Comments)    Reaction:  Makes pt hyper     Current Medications: Current Outpatient Medications  Medication Sig Dispense Refill   acetaminophen (TYLENOL) 500 MG tablet Take 500 mg by mouth every 6 (six) hours as needed for mild pain, moderate pain, fever or headache.      albuterol (PROVENTIL HFA;VENTOLIN HFA) 108 (90 Base) MCG/ACT inhaler Inhale 2 puffs into the lungs every 4 (four) hours as needed for wheezing or shortness of breath.     albuterol (PROVENTIL) (2.5 MG/3ML) 0.083% nebulizer solution Take 2.5 mg by nebulization every 6 (six) hours as needed for wheezing or shortness of breath.     ALPRAZolam (XANAX) 0.5 MG tablet Take 0.5 mg by mouth daily as needed.     ALPRAZolam (XANAX) 0.5 MG tablet Take by mouth.     budesonide-formoterol (SYMBICORT) 160-4.5  MCG/ACT inhaler Inhale 2 puffs into the lungs 2 (two) times daily.     cyanocobalamin 1000 MCG tablet Take 1,000 mcg by mouth daily.     escitalopram (LEXAPRO) 10 MG tablet Take 10 mg by mouth at bedtime.  0   fluticasone (FLONASE) 50 MCG/ACT nasal spray Place 2 sprays into both nostrils at bedtime.     furosemide (LASIX) 20 MG tablet Take 20 mg by mouth at bedtime.     gabapentin (NEURONTIN) 300 MG capsule Take 300 mg by mouth at bedtime.     lisinopril (PRINIVIL,ZESTRIL) 2.5  MG tablet Take 2.5 mg by mouth at bedtime.     lisinopril (ZESTRIL) 2.5 MG tablet Take 1 tablet by mouth daily.     loperamide (IMODIUM) 2 MG capsule Take 2 mg by mouth as needed for diarrhea or loose stools.     OXYGEN Inhale 2 L into the lungs continuous.     pantoprazole (PROTONIX) 40 MG tablet Take 1 tablet by mouth at bedtime.     SPIRIVA RESPIMAT 2.5 MCG/ACT AERS Inhale 2 puffs into the lungs every morning.  0   traMADol (ULTRAM) 50 MG tablet Take 50 mg by mouth 2 (two) times daily as needed for moderate pain.     Vitamin D, Ergocalciferol, (DRISDOL) 50000 units CAPS capsule Take 50,000 Units by mouth every 7 (seven) days. Pt takes on Saturday.     No current facility-administered medications for this visit.    Review of Systems  Constitutional:  Positive for fatigue. Negative for appetite change, chills and fever.  HENT:   Negative for hearing loss and voice change.   Eyes:  Negative for eye problems.  Respiratory:  Negative for chest tightness, cough and shortness of breath.   Cardiovascular:  Negative for chest pain.  Gastrointestinal:  Negative for abdominal distention, abdominal pain and blood in stool.  Endocrine: Negative for hot flashes.  Genitourinary:  Negative for difficulty urinating and frequency.   Musculoskeletal:  Negative for arthralgias.  Skin:  Negative for itching and rash.  Neurological:  Negative for extremity weakness.  Hematological:  Negative for adenopathy.  Psychiatric/Behavioral:   Negative for confusion.       Physical Exam: Blood pressure (!) 159/81, pulse 93, temperature 97.8 F (36.6 C), temperature source Tympanic, resp. rate 18, weight 104 lb (47.2 kg), SpO2 96%, peak flow (!) 2 L/min. Physical Exam Constitutional:      General: She is not in acute distress.    Appearance: She is not diaphoretic.  HENT:     Head: Normocephalic and atraumatic.  Eyes:     General: No scleral icterus. Cardiovascular:     Rate and Rhythm: Normal rate and regular rhythm.  Pulmonary:     Effort: Pulmonary effort is normal. No respiratory distress.     Comments: Severely decreased breath sound bilaterally.  Patient uses via nasal cannula oxygen Abdominal:     General: There is no distension.     Palpations: Abdomen is soft.  Musculoskeletal:        General: Normal range of motion.     Cervical back: Normal range of motion and neck supple.     Comments: Right clavicle tenderness, prominent  Skin:    Findings: No erythema.  Neurological:     Mental Status: She is alert and oriented to person, place, and time. Mental status is at baseline.     Cranial Nerves: No cranial nerve deficit.     Motor: No abnormal muscle tone.  Psychiatric:        Mood and Affect: Mood and affect normal.      Appointment on 10/30/2022  Component Date Value Ref Range Status   Iron 10/30/2022 74  28 - 170 ug/dL Final   TIBC 62/13/0865 351  250 - 450 ug/dL Final   Saturation Ratios 10/30/2022 21  10.4 - 31.8 % Final   UIBC 10/30/2022 277  ug/dL Final   Performed at [facility], [address], [place], [place] [address]   Ferritin 10/30/2022 56  11 - 307 ng/mL Final   Performed at  [facility], [address] [place], [place] [address]   WBC Count 10/30/2022 4.7  4.0 - 10.5 K/uL Final   RBC 10/30/2022 3.93  3.87 - 5.11 MIL/uL Final   Hemoglobin 10/30/2022 11.2 (L)  12.0 - 15.0 g/dL Final   HCT 60/45/4098 34.1 (L)  36.0 - 46.0 % Final   MCV 10/30/2022 86.8  80.0 -  100.0 fL Final   MCH 10/30/2022 28.5  26.0 - 34.0 pg Final   MCHC 10/30/2022 32.8  30.0 - 36.0 g/dL Final   RDW 11/91/4782 13.7  11.5 - 15.5 % Final   Platelet Count 10/30/2022 214  150 - 400 K/uL Final   nRBC 10/30/2022 0.0  0.0 - 0.2 % Final   Neutrophils Relative % 10/30/2022 45  % Final   Neutro Abs 10/30/2022 2.1  1.7 - 7.7 K/uL Final   Lymphocytes Relative 10/30/2022 40  % Final   Lymphs Abs 10/30/2022 1.9  0.7 - 4.0 K/uL Final   Monocytes Relative 10/30/2022 9  % Final   Monocytes Absolute 10/30/2022 0.4  0.1 - 1.0 K/uL Final   Eosinophils Relative 10/30/2022 5  % Final   Eosinophils Absolute 10/30/2022 0.2  0.0 - 0.5 K/uL Final   Basophils Relative 10/30/2022 1  % Final   Basophils Absolute 10/30/2022 0.0  0.0 - 0.1 K/uL Final   Immature Granulocytes 10/30/2022 0  % Final   Abs Immature Granulocytes 10/30/2022 0.01  0.00 - 0.07 K/uL Final   Performed at [facility], [address], [place], [place] [address]

## 2022-10-31 NOTE — Assessment & Plan Note (Signed)
Atypical ductal hyperplasia is associated with a generalized, bilateral increase in breast cancer risk. S/p surgical excision.  I recommend annual mammogram surveillance.   We discussed about option of chemoprevention.  Given patient's age, multiple other comorbidities, she decision was made not to proceed with chemoprevention.

## 2022-10-31 NOTE — Assessment & Plan Note (Addendum)
#  History of iron deficiency anemia. Labs are reviewed and discussed with patient. Lab Results  Component Value Date   HGB 11.2 (L) 10/30/2022   TIBC 351 10/30/2022   IRONPCTSAT 21 10/30/2022   FERRITIN 56 10/30/2022     She cannot tolerate oral iron. Recommend IV Venofer x 1 to further improve iron store.

## 2023-04-30 ENCOUNTER — Telehealth: Payer: Self-pay | Admitting: Oncology

## 2023-04-30 ENCOUNTER — Inpatient Hospital Stay: Payer: 59

## 2023-04-30 NOTE — Telephone Encounter (Signed)
 pt canceled by phone from appt reminder. I called pt to make sure she wanted to cancel and not r/s she stated yes, just cancel appts as she did not like the way MD spoke to her

## 2023-05-02 ENCOUNTER — Inpatient Hospital Stay: Payer: 59 | Admitting: Oncology

## 2023-05-02 ENCOUNTER — Inpatient Hospital Stay: Payer: 59

## 2023-05-04 ENCOUNTER — Telehealth: Payer: Self-pay | Admitting: Oncology

## 2023-05-04 NOTE — Telephone Encounter (Signed)
 Left vm for patient to contact me to discuss her recent interaction with the provider and how we could continue to meet her needs to continue her care. Tanya Rhodes was asked to contact me directly so I could address her concerns.

## 2023-10-03 ENCOUNTER — Encounter: Attending: Specialist

## 2023-10-03 ENCOUNTER — Other Ambulatory Visit: Payer: Self-pay

## 2023-10-03 ENCOUNTER — Encounter

## 2023-10-03 DIAGNOSIS — Z9981 Dependence on supplemental oxygen: Secondary | ICD-10-CM | POA: Insufficient documentation

## 2023-10-03 DIAGNOSIS — J449 Chronic obstructive pulmonary disease, unspecified: Secondary | ICD-10-CM | POA: Insufficient documentation

## 2023-10-03 DIAGNOSIS — Z87891 Personal history of nicotine dependence: Secondary | ICD-10-CM | POA: Insufficient documentation

## 2023-10-03 DIAGNOSIS — Z5189 Encounter for other specified aftercare: Secondary | ICD-10-CM | POA: Insufficient documentation

## 2023-10-03 NOTE — Progress Notes (Signed)
 Virtual Visit completed. Patient informed on EP and RD appointment and 6 Minute walk test. Patient also informed of patient health questionnaires on My Chart. Patient Verbalizes understanding. Visit diagnosis can be found in [facility] 09/13/2023.

## 2023-10-09 ENCOUNTER — Encounter

## 2023-10-09 VITALS — Ht 60.6 in | Wt 102.7 lb

## 2023-10-09 DIAGNOSIS — J449 Chronic obstructive pulmonary disease, unspecified: Secondary | ICD-10-CM

## 2023-10-09 DIAGNOSIS — Z5189 Encounter for other specified aftercare: Secondary | ICD-10-CM | POA: Diagnosis present

## 2023-10-09 DIAGNOSIS — Z87891 Personal history of nicotine dependence: Secondary | ICD-10-CM | POA: Diagnosis not present

## 2023-10-09 DIAGNOSIS — Z9981 Dependence on supplemental oxygen: Secondary | ICD-10-CM | POA: Diagnosis not present

## 2023-10-09 NOTE — Progress Notes (Signed)
 Pulmonary Individual Treatment Plan  Patient Details  Name: Tanya Rhodes MRN: [id number] Date of Birth: 21-Sep-1940 Referring Provider:   Flowsheet Row Pulmonary Rehab from 10/09/2023 in [facility] Cardiac and Pulmonary Rehab  Referring Provider Dr. Lavelle Servant    Initial Encounter Date:  Flowsheet Row Pulmonary Rehab from 10/09/2023 in [facility] Cardiac and Pulmonary Rehab  Date 10/09/23    Visit Diagnosis: COPD, moderate (HCC)  Patient's Home Medications on Admission:  Current Outpatient Medications:    acetaminophen  (TYLENOL ) 500 MG tablet, Take 500 mg by mouth every 6 (six) hours as needed for mild pain, moderate pain, fever or headache. , Disp: , Rfl:    albuterol  (PROVENTIL  HFA;VENTOLIN  HFA) 108 (90 Base) MCG/ACT inhaler, Inhale 2 puffs into the lungs every 4 (four) hours as needed for wheezing or shortness of breath., Disp: , Rfl:    albuterol  (PROVENTIL ) (2.5 MG/3ML) 0.083% nebulizer solution, Take 2.5 mg by nebulization every 6 (six) hours as needed for wheezing or shortness of breath., Disp: , Rfl:    ALPRAZolam  (XANAX ) 0.5 MG tablet, Take 0.5 mg by mouth daily as needed. (Patient not taking: Reported on 10/03/2023), Disp: , Rfl:    ALPRAZolam  (XANAX ) 0.5 MG tablet, Take by mouth. (Patient not taking: Reported on 10/03/2023), Disp: , Rfl:    ALPRAZolam  (XANAX ) 0.5 MG tablet, Take 0.5 mg by mouth., Disp: , Rfl:    amLODipine (NORVASC) 2.5 MG tablet, Take 2.5 mg by mouth., Disp: , Rfl:    budesonide -formoterol  (SYMBICORT ) 160-4.5 MCG/ACT inhaler, Inhale 2 puffs into the lungs 2 (two) times daily., Disp: , Rfl:    cyanocobalamin  1000 MCG tablet, Take 1,000 mcg by mouth daily., Disp: , Rfl:    escitalopram  (LEXAPRO ) 10 MG tablet, Take 10 mg by mouth at bedtime. (Patient not taking: Reported on 10/03/2023), Disp: , Rfl: 0   escitalopram  (LEXAPRO ) 10 MG tablet, Take 10 mg by mouth., Disp: , Rfl:    fluticasone  (FLONASE ) 50 MCG/ACT nasal spray, Place 2 sprays into both nostrils at bedtime.,  Disp: , Rfl:    furosemide  (LASIX ) 20 MG tablet, Take 20 mg by mouth at bedtime. (Patient not taking: Reported on 10/03/2023), Disp: , Rfl:    gabapentin  (NEURONTIN ) 300 MG capsule, Take 300 mg by mouth at bedtime., Disp: , Rfl:    lisinopril  (PRINIVIL ,ZESTRIL ) 2.5 MG tablet, Take 2.5 mg by mouth at bedtime., Disp: , Rfl:    lisinopril  (ZESTRIL ) 2.5 MG tablet, Take 1 tablet by mouth daily. (Patient not taking: Reported on 10/03/2023), Disp: , Rfl:    loperamide (IMODIUM) 2 MG capsule, Take 2 mg by mouth as needed for diarrhea or loose stools., Disp: , Rfl:    metoprolol  succinate (TOPROL -XL) 25 MG 24 hr tablet, Take 25 mg by mouth daily. (Patient not taking: Reported on 10/03/2023), Disp: , Rfl:    OXYGEN , Inhale 2 L into the lungs continuous., Disp: , Rfl:    pantoprazole (PROTONIX) 40 MG tablet, Take 1 tablet by mouth at bedtime. (Patient not taking: Reported on 10/03/2023), Disp: , Rfl:    pantoprazole (PROTONIX) 40 MG tablet, Take 1 tablet by mouth daily., Disp: , Rfl:    simvastatin  (ZOCOR ) 20 MG tablet, Take 20 mg by mouth at bedtime. (Patient not taking: Reported on 10/03/2023), Disp: , Rfl:    SPIRIVA  RESPIMAT 2.5 MCG/ACT AERS, Inhale 2 puffs into the lungs every morning. (Patient not taking: Reported on 10/03/2023), Disp: , Rfl: 0   Tiotropium Bromide  Monohydrate (SPIRIVA  RESPIMAT) 2.5 MCG/ACT AERS, Inhale 5 mcg into  the lungs., Disp: , Rfl:    traMADol  (ULTRAM ) 50 MG tablet, Take 50 mg by mouth 2 (two) times daily as needed for moderate pain. (Patient not taking: Reported on 10/03/2023), Disp: , Rfl:    traMADol  (ULTRAM ) 50 MG tablet, Take 50 mg by mouth., Disp: , Rfl:    Vitamin D , Ergocalciferol , (DRISDOL ) 50000 units CAPS capsule, Take 50,000 Units by mouth every 7 (seven) days. 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   a.) CNB 07/21/2022 --> pathology (+) for ER/PR + ADH   Centrilobular emphysema (HCC)    Chronic systolic CHF (congestive heart failure) (HCC)    a.) TTE 03/31/2015: EF 20-25%, diffuse HK, mod LAE, sev LVE, mild RAE/RVE, mod TR; b.) MV 05/14/2015: EF 32%; c.) TTE 12/12/2016: EF 45%, no RWMAs, norm RVSF, mild MR/TR; d.) TTE 08/24/2022: EF 40%, sep HK, G1DD, norm RVSF, triv AR/TR, mild MR; e.) MV 08/24/2022: EF 49%   Colon polyposis    a.) s/p partial colectomy 2013   COPD (chronic obstructive pulmonary disease) (HCC)    Coronary artery disease involving native coronary artery of native heart without angina pectoris    a.) LHC 07/06/2008: 20% mLAD, 95% pRCA (3.5 x 18 mm Vision BMS); b.) MV 05/14/2015: no ischemia; c.) MV 08/24/2022: small reversible perfusion abnormality in the inferior region consistent with mild inferior ischemia.   Cortical senile cataract    DDD (degenerative disc disease), lumbar    Depression    Dilated cardiomyopathy (HCC)    a.) TTE 03/31/2015: EF 20-25%; b.) MV 05/14/2015: EF 32%; c.) TTE 12/12/2016: EF 45%; d.) TTE 08/24/2022: EF 40%; e.) MV 08/24/2022: EF 49%   Dyspnea    Former smoker    GERD (gastroesophageal reflux disease)    HLD (hyperlipidemia)    Hypertension    IDA (iron  deficiency anemia)    Internal hemorrhoids    LBBB (left bundle branch block)    Levoscoliosis of thoracic spine    Low serum vitamin D     Lumbar radiculitis    Lumbar spondylitis    Lumbar stenosis with neurogenic claudication    Melena    Osteoarthritis    PUD (peptic ulcer disease)    Pulmonary nodule    Restless leg    Status post left rotator cuff repair    Supplemental oxygen  dependent    a.) 2L/[place] continuous   Unintentional weight loss    Vitamin B12 deficiency     Tobacco Use: Social History   Tobacco Use   Smoking Status Former   Current packs/day: 0.00   Average packs/day: 1.5 packs/day for 52.5 years (78.8 ttl pk-yrs)   Types: Cigarettes   Start date: 05/10/1941   Quit date: 11/09/1993   Years since quitting: 29.9  Smokeless Tobacco Never    Labs: Review Flowsheet       Latest Ref Rng & Units 10/27/2013  Labs for ITP Cardiac and Pulmonary Rehab  Hemoglobin A1c 4.2 - 6.3 % 5.9      Pulmonary Assessment Scores:  Pulmonary Assessment Scores     Row Name 10/09/23 1341         CAT  Score   CAT Score 20        UCSD: Self-administered rating of dyspnea associated with activities of daily living (ADLs) 6-point scale (0 = not at all to 5 = maximal or unable to do because of breathlessness)  Scoring Scores range from 0 to 120.  Minimally important difference is 5 units  CAT: CAT can identify the health impairment of COPD patients and is better correlated with disease progression.  CAT has a scoring range of zero to 40. The CAT score is classified into four groups of low (less than 10), medium (10 - 20), high (21-30) and very high (31-40) based on the impact level of disease on health status. A CAT score over 10 suggests significant symptoms.  A worsening CAT score could be explained by an exacerbation, poor medication adherence, poor inhaler technique, or progression of COPD or comorbid conditions.  CAT MCID is 2 points  mMRC: mMRC (Modified Medical Research Council) Dyspnea Scale is used to assess the degree of baseline functional disability in patients of respiratory disease due to dyspnea. No minimal important difference is established. A decrease in score of 1 point or greater is considered a positive change.   Pulmonary Function Assessment:  Pulmonary Function Assessment - 10/03/23 1423       Pulmonary Function Tests   FEV1% 51 %    FEV1/FVC Ratio 45      Breath   Shortness of Breath Yes;Limiting activity          Exercise Target Goals: Exercise Program  Goal: Individual exercise prescription set using results from initial 6 min walk test and THRR while considering  patient's activity barriers and safety.   Exercise Prescription Goal: Initial exercise prescription builds to 30-45 minutes a day of aerobic activity, 2-3 days per week.  Home exercise guidelines will be given to patient during program as part of exercise prescription that the participant will acknowledge.  Education: Aerobic Exercise: - Group verbal and visual presentation on the components of exercise prescription. Introduces F.I.T.T principle from ACSM for exercise prescriptions.  Reviews F.I.T.T. principles of aerobic exercise including progression. Written material provided at class time.   Education: Resistance Exercise: - Group verbal and visual presentation on the components of exercise prescription. Introduces F.I.T.T principle from ACSM for exercise prescriptions  Reviews F.I.T.T. principles of resistance exercise including progression. Written material provided at class time.    Education: Exercise & Equipment Safety: - Individual verbal instruction and demonstration of equipment use and safety with use of the equipment. Flowsheet Row Pulmonary Rehab from 10/09/2023 in [facility] Cardiac and Pulmonary Rehab  Date 10/09/23  Educator [facility]  Instruction Review Code 1- Verbalizes Understanding    Education: Exercise Physiology & General Exercise Guidelines: - Group verbal and written instruction with models to review the exercise physiology of the cardiovascular system and associated critical values. Provides general exercise guidelines with specific guidelines to those with heart or lung disease.    Education: Flexibility, Balance, Mind/Body Relaxation: - Group verbal and visual presentation with interactive activity on the components of exercise prescription. Introduces F.I.T.T principle from ACSM for exercise prescriptions. Reviews F.I.T.T. principles of flexibility and balance  exercise training including progression. Also discusses the mind body connection.  Reviews various relaxation techniques to help reduce and manage stress (i.e. Deep breathing, progressive muscle relaxation, and visualization). Balance handout provided to take home. Written material provided at class time.   Activity Barriers & Risk Stratification:  Activity Barriers & Cardiac Risk Stratification - 10/09/23 1516  Activity Barriers & Cardiac Risk Stratification   Activity Barriers Shortness of Breath;History of Falls          6 Minute Walk:  6 Minute Walk     Row Name 10/09/23 1513         6 Minute Walk   Phase Initial     Distance 770 feet     Walk Time 6 minutes     # of Rest Breaks 0     MPH 1.45     METS 1.61     RPE 13     Perceived Dyspnea  3     VO2 Peak 5.64     Symptoms No     Resting HR 91 bpm     Resting BP 146/62     Resting Oxygen  Saturation  93 %     Exercise Oxygen  Saturation  during 6 min walk 90 %     Max Ex. HR 102 bpm     Max Ex. BP 142/68     2 Minute Post BP 138/62       Interval HR   1 Minute HR 90     2 Minute HR 91     3 Minute HR 93     4 Minute HR 99     5 Minute HR 99     6 Minute HR 102     2 Minute Post HR 86     Interval Heart Rate? Yes       Interval Oxygen    Interval Oxygen ? Yes     Baseline Oxygen  Saturation % 93 %     1 Minute Oxygen  Saturation % 97 %     1 Minute Liters of Oxygen  2 L     2 Minute Oxygen  Saturation % 97 %     2 Minute Liters of Oxygen  2 L     3 Minute Oxygen  Saturation % 90 %     3 Minute Liters of Oxygen  2 L     4 Minute Oxygen  Saturation % 91 %     4 Minute Liters of Oxygen  2 L     5 Minute Oxygen  Saturation % 92 %     5 Minute Liters of Oxygen  2 L     6 Minute Oxygen  Saturation % 96 %     6 Minute Liters of Oxygen  2 L     2 Minute Post Oxygen  Saturation % 98 %     2 Minute Post Liters of Oxygen  2 L       Oxygen  Initial Assessment:  Oxygen  Initial Assessment - 10/03/23 1422       Home  Oxygen    Home Oxygen  Device Home Concentrator;Portable Concentrator;E-Tanks    Sleep Oxygen  Prescription Continuous    Liters per minute 2    Home Exercise Oxygen  Prescription Continuous    Liters per minute 2    Home Resting Oxygen  Prescription Continuous    Liters per minute 2    Compliance with Home Oxygen  Use Yes      Initial 6 min Walk   Oxygen  Used Continuous    Liters per minute 2      Program Oxygen  Prescription   Program Oxygen  Prescription Continuous    Liters per minute 2      Intervention   Short Term Goals To learn and exhibit compliance with exercise, home and travel O2 prescription;To learn and understand importance of monitoring SPO2 with pulse oximeter and demonstrate accurate use of the pulse  oximeter.;To learn and demonstrate proper pursed lip breathing techniques or other breathing techniques. ;To learn and understand importance of maintaining oxygen  saturations>88%;To learn and demonstrate proper use of respiratory medications    Long  Term Goals Exhibits compliance with exercise, home  and travel O2 prescription;Maintenance of O2 saturations>88%;Compliance with respiratory medication;Demonstrates proper use of MDI's;Exhibits proper breathing techniques, such as pursed lip breathing or other method taught during program session;Verbalizes importance of monitoring SPO2 with pulse oximeter and return demonstration          Oxygen  Re-Evaluation:   Oxygen  Discharge (Final Oxygen  Re-Evaluation):   Initial Exercise Prescription:  Initial Exercise Prescription - 10/09/23 1500       Date of Initial Exercise RX and Referring Provider   Date 10/09/23    Referring Provider Dr. Lavelle Servant      Oxygen    Oxygen  Continuous    Liters 2L    Maintain Oxygen  Saturation 88% or higher      Recumbant Bike   Level 2    RPM 50    Watts 25    Minutes 15    METs 1.61      NuStep   Level 2    SPM 80    Minutes 15    METs 1.61      REL-XR   Level 2    Watts 25     Speed 50    Minutes 15    METs 1.61      T5 Nustep   Level 2    SPM 80    Minutes 15    METs 1.61      Biostep-RELP   Level 2    SPM 50    Minutes 15    METs 1.61      Track   Laps 20    Minutes 15    METs 2.09      Prescription Details   Duration Progress to 30 minutes of continuous aerobic without signs/symptoms of physical distress      Intensity   THRR 40-80% of Max Heartrate 109-127    Ratings of Perceived Exertion 11-13    Perceived Dyspnea 0-4      Progression   Progression Continue to progress workloads to maintain intensity without signs/symptoms of physical distress.      Resistance Training   Training Prescription Yes    Weight 1lb    Reps 10-15          Perform Capillary Blood Glucose checks as needed.  Exercise Prescription Changes:   Exercise Prescription Changes     Row Name 10/09/23 1500             Response to Exercise   Blood Pressure (Admit) 146/62       Blood Pressure (Exercise) 142/68       Blood Pressure (Exit) 138/62       Heart Rate (Admit) 91 bpm       Heart Rate (Exercise) 102 bpm       Heart Rate (Exit) 86 bpm       Oxygen  Saturation (Admit) 93 %       Oxygen  Saturation (Exercise) 90 %       Oxygen  Saturation (Exit) 98 %       Rating of Perceived Exertion (Exercise) 13       Perceived Dyspnea (Exercise) 3       Symptoms none       Comments results  Exercise Comments:   Exercise Goals and Review:   Exercise Goals     Row Name 10/09/23 1520             Exercise Goals   Increase Physical Activity Yes       Intervention Provide advice, education, support and counseling about physical activity/exercise needs.;Develop an individualized exercise prescription for aerobic and resistive training based on initial evaluation findings, risk stratification, comorbidities and participant's personal goals.       Expected Outcomes Short Term: Attend rehab on a regular basis to increase amount of physical  activity.;Long Term: Add in home exercise to make exercise part of routine and to increase amount of physical activity.;Long Term: Exercising regularly at least 3-5 days a week.       Increase Strength and Stamina Yes       Intervention Provide advice, education, support and counseling about physical activity/exercise needs.;Develop an individualized exercise prescription for aerobic and resistive training based on initial evaluation findings, risk stratification, comorbidities and participant's personal goals.       Expected Outcomes Short Term: Increase workloads from initial exercise prescription for resistance, speed, and METs.;Short Term: Perform resistance training exercises routinely during rehab and add in resistance training at home;Long Term: Improve cardiorespiratory fitness, muscular endurance and strength as measured by increased METs and functional capacity ( )       Able to understand and use rate of perceived exertion (RPE) scale Yes       Intervention Provide education and explanation on how to use RPE scale       Expected Outcomes Short Term: Able to use RPE daily in rehab to express subjective intensity level;Long Term:  Able to use RPE to guide intensity level when exercising independently       Able to understand and use Dyspnea scale Yes       Intervention Provide education and explanation on how to use Dyspnea scale       Expected Outcomes Short Term: Able to use Dyspnea scale daily in rehab to express subjective sense of shortness of breath during exertion;Long Term: Able to use Dyspnea scale to guide intensity level when exercising independently       Knowledge and understanding of Target Heart Rate Range (THRR) Yes       Intervention Provide education and explanation of THRR including how the numbers were predicted and where they are located for reference       Expected Outcomes Short Term: Able to state/look up THRR;Short Term: Able to use daily as guideline for intensity in  rehab;Long Term: Able to use THRR to govern intensity when exercising independently       Able to check pulse independently Yes       Intervention Provide education and demonstration on how to check pulse in carotid and radial arteries.;Review the importance of being able to check your own pulse for safety during independent exercise       Expected Outcomes Long Term: Able to check pulse independently and accurately;Short Term: Able to explain why pulse checking is important during independent exercise       Understanding of Exercise Prescription Yes       Intervention Provide education, explanation, and written materials on patient's individual exercise prescription       Expected Outcomes Short Term: Able to explain program exercise prescription;Long Term: Able to explain home exercise prescription to exercise independently          Exercise Goals Re-Evaluation :   Discharge  Exercise Prescription (Final Exercise Prescription Changes):  Exercise Prescription Changes - 10/09/23 1500       Response to Exercise   Blood Pressure (Admit) 146/62    Blood Pressure (Exercise) 142/68    Blood Pressure (Exit) 138/62    Heart Rate (Admit) 91 bpm    Heart Rate (Exercise) 102 bpm    Heart Rate (Exit) 86 bpm    Oxygen  Saturation (Admit) 93 %    Oxygen  Saturation (Exercise) 90 %    Oxygen  Saturation (Exit) 98 %    Rating of Perceived Exertion (Exercise) 13    Perceived Dyspnea (Exercise) 3    Symptoms none    Comments results          Nutrition:  Target Goals: Understanding of nutrition guidelines, daily intake of sodium 1500mg , cholesterol 200mg , calories 30% from fat and 7% or less from saturated fats, daily to have 5 or more servings of fruits and vegetables.  Education: Nutrition 1 -Group instruction provided by verbal, written material, interactive activities, discussions, models, and posters to present general guidelines for heart healthy nutrition including macronutrients,  label reading, and promoting whole foods over processed counterparts. Education serves as Pensions consultant of discussion of heart healthy eating for all. Written material provided at class time.     Education: Nutrition 2 -Group instruction provided by verbal, written material, interactive activities, discussions, models, and posters to present general guidelines for heart healthy nutrition including sodium, cholesterol, and saturated fat. Providing guidance of habit forming to improve blood pressure, cholesterol, and body weight. Written material provided at class time.     Biometrics:  Pre Biometrics - 10/09/23 1520       Pre Biometrics   Height 5' 0.6 (1.539 m)    Weight 102 lb 11.2 oz (46.6 kg)    Waist Circumference 29 inches    Hip Circumference 35 inches    Waist to Hip Ratio 0.83 %    BMI (Calculated) 19.67    Single Leg Stand 0 seconds           Nutrition Therapy Plan and Nutrition Goals:   Nutrition Assessments:  MEDIFICTS Score Key: >=70 Need to make dietary changes  40-70 Heart Healthy Diet <= 40 Therapeutic Level Cholesterol Diet  Flowsheet Row Pulmonary Rehab from 10/09/2023 in [facility] Cardiac and Pulmonary Rehab  Picture Your Plate Total Score on Admission 54   Picture Your Plate Scores: <59 Unhealthy dietary pattern with much room for improvement. 41-50 Dietary pattern unlikely to meet recommendations for good health and room for improvement. 51-60 More healthful dietary pattern, with some room for improvement.  >60 Healthy dietary pattern, although there may be some specific behaviors that could be improved.   Nutrition Goals Re-Evaluation:   Nutrition Goals Discharge (Final Nutrition Goals Re-Evaluation):   Psychosocial: Target Goals: Acknowledge presence or absence of significant depression and/or stress, maximize coping skills, provide positive support system. Participant is able to verbalize types and ability to use techniques and skills needed for  reducing stress and depression.   Education: Stress, Anxiety, and Depression - Group verbal and visual presentation to define topics covered.  Reviews how body is impacted by stress, anxiety, and depression.  Also discusses healthy ways to reduce stress and to treat/manage anxiety and depression.  Written material provided at class time.   Education: Sleep Hygiene -Provides group verbal and written instruction about how sleep can affect your health.  Define sleep hygiene, discuss sleep cycles and impact of sleep habits. Review good sleep hygiene  tips.    Initial Review & Psychosocial Screening:  Initial Psych Review & Screening - 10/03/23 1426       Initial Review   Current issues with Current Psychotropic Meds;Current Anxiety/Panic      Family Dynamics   Good Support System? Yes    Comments Villa lives with her son and takes care of her if he needs to. She states she has anxiety at times and does not know where it stems from.      Barriers   Psychosocial barriers to participate in program The patient should benefit from training in stress management and relaxation.;Psychosocial barriers identified (see note)      Screening Interventions   Interventions Provide feedback about the scores to participant;To provide support and resources with identified psychosocial needs;Encouraged to exercise    Expected Outcomes Short Term goal: Utilizing psychosocial counselor, staff and physician to assist with identification of specific Stressors or current issues interfering with healing process. Setting desired goal for each stressor or current issue identified.;Long Term Goal: Stressors or current issues are controlled or eliminated.;Short Term goal: Identification and review with participant of any Quality of Life or Depression concerns found by scoring the questionnaire.;Long Term goal: The participant improves quality of Life and PHQ9 Scores as seen by post scores and/or verbalization of changes           Quality of Life Scores:  Scores of 19 and below usually indicate a poorer quality of life in these areas.  A difference of  2-3 points is a clinically meaningful difference.  A difference of 2-3 points in the total score of the Quality of Life Index has been associated with significant improvement in overall quality of life, self-image, physical symptoms, and general health in studies assessing change in quality of life.  PHQ-9: Review Flowsheet        No data to display         Interpretation of Total Score  Total Score Depression Severity:  1-4 = Minimal depression, 5-9 = Mild depression, 10-14 = Moderate depression, 15-19 = Moderately severe depression, 20-27 = Severe depression   Psychosocial Evaluation and Intervention:  Psychosocial Evaluation - 10/03/23 1427       Psychosocial Evaluation & Interventions   Interventions Encouraged to exercise with the program and follow exercise prescription;Relaxation education;Stress management education    Comments Halana lives with her son and takes care of her if he needs to. She states she has anxiety at times and does not know where it stems from.    Expected Outcomes Short: Start [facility] to help with mood. Long: Maintain a healthy mental state    Continue Psychosocial Services  Follow up required by staff          Psychosocial Re-Evaluation:   Psychosocial Discharge (Final Psychosocial Re-Evaluation):   Education: Education Goals: Education classes will be provided on a weekly basis, covering required topics. Participant will state understanding/return demonstration of topics presented.  Learning Barriers/Preferences:  Learning Barriers/Preferences - 10/03/23 1424       Learning Barriers/Preferences   Learning Barriers Hearing    Learning Preferences None          General Pulmonary Education Topics:  Infection Prevention: - Provides verbal and written material to individual with discussion of  infection control including proper hand washing and proper equipment cleaning during exercise session. Flowsheet Row Pulmonary Rehab from 10/09/2023 in [facility] Pulmonary Rehab  Date 10/09/23  Educator [facility]  Instruction Review Code 1- Verbalizes Understanding  Falls Prevention: - Provides verbal and written material to individual with discussion of falls prevention and safety. Flowsheet Row Pulmonary Rehab from 10/09/2023 in [facility] Rehab  Date 10/09/23  Educator [facility]  Instruction Review Code 1- Verbalizes Understanding    Chronic Lung Disease Review: - Group verbal instruction with posters, models, PowerPoint presentations and videos,  to review new updates, new respiratory medications, new advancements in procedures and treatments. Providing information on websites and 800 numbers for continued self-education. Includes information about supplement oxygen , available portable oxygen  systems, continuous and intermittent flow rates, oxygen  safety, concentrators, and Medicare reimbursement for oxygen . Explanation of Pulmonary Drugs, including class, frequency, complications, importance of spacers, rinsing mouth after steroid MDI's, and proper cleaning methods for nebulizers. Review of basic lung anatomy and physiology related to function, structure, and complications of lung disease. Review of risk factors. Discussion about methods for diagnosing sleep apnea and types of masks and machines for OSA. Includes a review of the use of types of environmental controls: home humidity, furnaces, filters, dust mite/pet prevention, HEPA vacuums. Discussion about weather changes, air quality and the benefits of nasal washing. Instruction on Warning signs, infection symptoms, calling MD promptly, preventive modes, and value of vaccinations. Review of effective airway clearance, coughing and/or vibration techniques. Emphasizing that all should Create an Action Plan. Written material provided  at class time. Flowsheet Row Pulmonary Rehab from 10/09/2023 in [facility] Cardiac and Pulmonary Rehab  Education need identified 10/09/23    AED/CPR: - Group verbal and written instruction with the use of models to demonstrate the basic use of the AED with the basic ABC's of resuscitation.    Tests and Procedures:  - Group verbal and visual presentation and models provide information about basic cardiac anatomy and function. Reviews the testing methods done to diagnose heart disease and the outcomes of the test results. Describes the treatment choices: Medical Management, Angioplasty, or Coronary Bypass Surgery for treating various heart conditions including Myocardial Infarction, Angina, Valve Disease, and Cardiac Arrhythmias.  Written material provided at class time.   Medication Safety: - Group verbal and visual instruction to review commonly prescribed medications for heart and lung disease. Reviews the medication, class of the drug, and side effects. Includes the steps to properly store meds and maintain the prescription regimen.  Written material given at graduation.   Other: -Provides group and verbal instruction on various topics (see comments)   Knowledge Questionnaire Score:  Knowledge Questionnaire Score - 10/09/23 1343       Knowledge Questionnaire Score   Pre Score 13/18           Core Components/Risk Factors/Patient Goals at Admission:  Personal Goals and Risk Factors at Admission - 10/03/23 1424       Core Components/Risk Factors/Patient Goals on Admission    Weight Management Yes;Weight Gain    Intervention Weight Management: Develop a combined nutrition and exercise program designed to reach desired caloric intake, while maintaining appropriate intake of nutrient and fiber, sodium and fats, and appropriate energy expenditure required for the weight goal.;Weight Management: Provide education and appropriate resources to help participant work on and attain dietary  goals.;Weight Management/Obesity: Establish reasonable short term and long term weight goals.    Expected Outcomes Short Term: Continue to assess and modify interventions until short term weight is achieved;Weight Maintenance: Understanding of the daily nutrition guidelines, which includes 25-35% calories from fat, 7% or less cal from saturated fats, less than 200mg  cholesterol, less than 1.5gm of sodium, & 5 or  more servings of fruits and vegetables daily;Understanding recommendations for meals to include 15-35% energy as protein, 25-35% energy from fat, 35-60% energy from carbohydrates, less than 200mg  of dietary cholesterol, 20-35 gm of total fiber daily;Understanding of distribution of calorie intake throughout the day with the consumption of 4-5 meals/snacks;Weight Gain: Understanding of general recommendations for a high calorie, high protein meal plan that promotes weight gain by distributing calorie intake throughout the day with the consumption for 4-5 meals, snacks, and/or supplements    Improve shortness of breath with ADL's Yes    Intervention Provide education, individualized exercise plan and daily activity instruction to help decrease symptoms of SOB with activities of daily living.    Expected Outcomes Short Term: Improve cardiorespiratory fitness to achieve a reduction of symptoms when performing ADLs;Long Term: Be able to perform more ADLs without symptoms or delay the onset of symptoms    Hypertension Yes    Intervention Provide education on lifestyle modifcations including regular physical activity/exercise, weight management, moderate sodium restriction and increased consumption of fresh fruit, vegetables, and low fat dairy, alcohol moderation, and smoking cessation.;Monitor prescription use compliance.    Expected Outcomes Short Term: Continued assessment and intervention until BP is < 140/36mm HG in hypertensive participants. < 130/9mm HG in hypertensive participants with diabetes,  heart failure or chronic kidney disease.;Long Term: Maintenance of blood pressure at goal levels.    Lipids Yes    Intervention Provide education and support for participant on nutrition & aerobic/resistive exercise along with prescribed medications to achieve LDL 70mg , HDL >40mg .    Expected Outcomes Short Term: Participant states understanding of desired cholesterol values and is compliant with medications prescribed. Participant is following exercise prescription and nutrition guidelines.;Long Term: Cholesterol controlled with medications as prescribed, with individualized exercise RX and with personalized nutrition plan. Value goals: LDL < 70mg , HDL > 40 mg.          Education:Diabetes - Individual verbal and written instruction to review signs/symptoms of diabetes, desired ranges of glucose level fasting, after meals and with exercise. Acknowledge that pre and post exercise glucose checks will be done for 3 sessions at entry of program.   Know Your Numbers and Heart Failure: - Group verbal and visual instruction to discuss disease risk factors for cardiac and pulmonary disease and treatment options.  Reviews associated critical values for Overweight/Obesity, Hypertension, Cholesterol, and Diabetes.  Discusses basics of heart failure: signs/symptoms and treatments.  Introduces Heart Failure Zone chart for action plan for heart failure. Written material provided at class time.   Core Components/Risk Factors/Patient Goals Review:    Core Components/Risk Factors/Patient Goals at Discharge (Final Review):    ITP Comments:  ITP Comments     Row Name 10/03/23 1428 10/09/23 1512         ITP Comments Virtual Visit completed. Patient informed on EP and RD appointment and 6 Minute walk test. Patient also informed of patient health questionnaires on My Chart. Patient Verbalizes understanding. Visit diagnosis can be found in [facility] 09/13/2023. Completed and gym orientation for pulmonary rehab.  Initial ITP created and sent for review to Dr. Fuad Aleskerov, Medical Director.         Comments: Initial ITP

## 2023-10-09 NOTE — Patient Instructions (Signed)
 Patient Instructions  Patient Details  Name: Tanya Rhodes MRN: [id number] Date of Birth: 1940-12-20 Referring Provider:  Theotis Lavelle BRAVO, MD  Below are your personal goals for exercise, nutrition, and risk factors. Our goal is to help you stay on track towards obtaining and maintaining these goals. We will be discussing your progress on these goals with you throughout the program.  Initial Exercise Prescription:  Initial Exercise Prescription - 10/09/23 1500       Date of Initial Exercise RX and Referring Provider   Date 10/09/23    Referring Provider Dr. Lavelle Theotis      Oxygen    Oxygen  Continuous    Liters 2L    Maintain Oxygen  Saturation 88% or higher      Recumbant Bike   Level 2    RPM 50    Watts 25    Minutes 15    METs 1.61      NuStep   Level 2    SPM 80    Minutes 15    METs 1.61      REL-XR   Level 2    Watts 25    Speed 50    Minutes 15    METs 1.61      T5 Nustep   Level 2    SPM 80    Minutes 15    METs 1.61      Biostep-RELP   Level 2    SPM 50    Minutes 15    METs 1.61      Track   Laps 20    Minutes 15    METs 2.09      Prescription Details   Duration Progress to 30 minutes of continuous aerobic without signs/symptoms of physical distress      Intensity   THRR 40-80% of Max Heartrate 109-127    Ratings of Perceived Exertion 11-13    Perceived Dyspnea 0-4      Progression   Progression Continue to progress workloads to maintain intensity without signs/symptoms of physical distress.      Resistance Training   Training Prescription Yes    Weight 1lb    Reps 10-15          Exercise Goals: Frequency: Be able to perform aerobic exercise two to three times per week in program working toward 2-5 days per week of home exercise.  Intensity: Work with a perceived exertion of 11 (fairly light) - 15 (hard) while following your exercise prescription.  We will make changes to your prescription with you as you progress  through the program.   Duration: Be able to do 30 to 45 minutes of continuous aerobic exercise in addition to a 5 minute warm-up and a 5 minute cool-down routine.   Nutrition Goals: Your personal nutrition goals will be established when you do your nutrition analysis with the dietician.  The following are general nutrition guidelines to follow: Cholesterol < 200mg /day Sodium < 1500mg /day Fiber: Women over 50 yrs - 21 grams per day  Personal Goals:  Personal Goals and Risk Factors at Admission - 10/03/23 1424       Core Components/Risk Factors/Patient Goals on Admission    Weight Management Yes;Weight Gain    Intervention Weight Management: Develop a combined nutrition and exercise program designed to reach desired caloric intake, while maintaining appropriate intake of nutrient and fiber, sodium and fats, and appropriate energy expenditure required for the weight goal.;Weight Management: Provide education and appropriate resources to help participant  work on and attain dietary goals.;Weight Management/Obesity: Establish reasonable short term and long term weight goals.    Expected Outcomes Short Term: Continue to assess and modify interventions until short term weight is achieved;Weight Maintenance: Understanding of the daily nutrition guidelines, which includes 25-35% calories from fat, 7% or less cal from saturated fats, less than 200mg  cholesterol, less than 1.5gm of sodium, & 5 or more servings of fruits and vegetables daily;Understanding recommendations for meals to include 15-35% energy as protein, 25-35% energy from fat, 35-60% energy from carbohydrates, less than 200mg  of dietary cholesterol, 20-35 gm of total fiber daily;Understanding of distribution of calorie intake throughout the day with the consumption of 4-5 meals/snacks;Weight Gain: Understanding of general recommendations for a high calorie, high protein meal plan that promotes weight gain by distributing calorie intake throughout  the day with the consumption for 4-5 meals, snacks, and/or supplements    Improve shortness of breath with ADL's Yes    Intervention Provide education, individualized exercise plan and daily activity instruction to help decrease symptoms of SOB with activities of daily living.    Expected Outcomes Short Term: Improve cardiorespiratory fitness to achieve a reduction of symptoms when performing ADLs;Long Term: Be able to perform more ADLs without symptoms or delay the onset of symptoms    Hypertension Yes    Intervention Provide education on lifestyle modifcations including regular physical activity/exercise, weight management, moderate sodium restriction and increased consumption of fresh fruit, vegetables, and low fat dairy, alcohol moderation, and smoking cessation.;Monitor prescription use compliance.    Expected Outcomes Short Term: Continued assessment and intervention until BP is < 140/27mm HG in hypertensive participants. < 130/58mm HG in hypertensive participants with diabetes, heart failure or chronic kidney disease.;Long Term: Maintenance of blood pressure at goal levels.    Lipids Yes    Intervention Provide education and support for participant on nutrition & aerobic/resistive exercise along with prescribed medications to achieve LDL 70mg , HDL >40mg .    Expected Outcomes Short Term: Participant states understanding of desired cholesterol values and is compliant with medications prescribed. Participant is following exercise prescription and nutrition guidelines.;Long Term: Cholesterol controlled with medications as prescribed, with individualized exercise RX and with personalized nutrition plan. Value goals: LDL < 70mg , HDL > 40 mg.          Exercise Goals and Review:  Exercise Goals     Row Name 10/09/23 1520             Exercise Goals   Increase Physical Activity Yes       Intervention Provide advice, education, support and counseling about physical activity/exercise  needs.;Develop an individualized exercise prescription for aerobic and resistive training based on initial evaluation findings, risk stratification, comorbidities and participant's personal goals.       Expected Outcomes Short Term: Attend rehab on a regular basis to increase amount of physical activity.;Long Term: Add in home exercise to make exercise part of routine and to increase amount of physical activity.;Long Term: Exercising regularly at least 3-5 days a week.       Increase Strength and Stamina Yes       Intervention Provide advice, education, support and counseling about physical activity/exercise needs.;Develop an individualized exercise prescription for aerobic and resistive training based on initial evaluation findings, risk stratification, comorbidities and participant's personal goals.       Expected Outcomes Short Term: Increase workloads from initial exercise prescription for resistance, speed, and METs.;Short Term: Perform resistance training exercises routinely during rehab and add in resistance  training at home;Long Term: Improve cardiorespiratory fitness, muscular endurance and strength as measured by increased METs and functional capacity ( )       Able to understand and use rate of perceived exertion (RPE) scale Yes       Intervention Provide education and explanation on how to use RPE scale       Expected Outcomes Short Term: Able to use RPE daily in rehab to express subjective intensity level;Long Term:  Able to use RPE to guide intensity level when exercising independently       Able to understand and use Dyspnea scale Yes       Intervention Provide education and explanation on how to use Dyspnea scale       Expected Outcomes Short Term: Able to use Dyspnea scale daily in rehab to express subjective sense of shortness of breath during exertion;Long Term: Able to use Dyspnea scale to guide intensity level when exercising independently       Knowledge and understanding of  Target Heart Rate Range (THRR) Yes       Intervention Provide education and explanation of THRR including how the numbers were predicted and where they are located for reference       Expected Outcomes Short Term: Able to state/look up THRR;Short Term: Able to use daily as guideline for intensity in rehab;Long Term: Able to use THRR to govern intensity when exercising independently       Able to check pulse independently Yes       Intervention Provide education and demonstration on how to check pulse in carotid and radial arteries.;Review the importance of being able to check your own pulse for safety during independent exercise       Expected Outcomes Long Term: Able to check pulse independently and accurately;Short Term: Able to explain why pulse checking is important during independent exercise       Understanding of Exercise Prescription Yes       Intervention Provide education, explanation, and written materials on patient's individual exercise prescription       Expected Outcomes Short Term: Able to explain program exercise prescription;Long Term: Able to explain home exercise prescription to exercise independently          Copy of goals given to participant.

## 2023-10-15 ENCOUNTER — Encounter: Attending: Specialist | Admitting: Emergency Medicine

## 2023-10-15 DIAGNOSIS — J449 Chronic obstructive pulmonary disease, unspecified: Secondary | ICD-10-CM | POA: Insufficient documentation

## 2023-10-15 NOTE — Progress Notes (Signed)
 Daily Session Note  Patient Details  Name: Tanya Rhodes MRN: [id number] Date of Birth: 23-Aug-1940 Referring Provider:   Flowsheet Row Pulmonary Rehab from 10/09/2023 in [facility] Cardiac and Pulmonary Rehab  Referring Provider Dr. Lavelle Servant    Encounter Date: 10/15/2023  Check In:  Session Check In - 10/15/23 1411       Check-In   Supervising physician immediately available to respond to emergencies See telemetry face sheet for immediately available ER MD    Location [facility]-Cardiac & Pulmonary Rehab    Staff Present Leita Franks RN,BSN;Maxon Conetta BS, Exercise Physiologist;Noah Tickle, BS, Exercise Physiologist;Margaret Best, MS, Exercise Physiologist    Virtual Visit No    Medication changes reported     No    Fall or balance concerns reported    No    Tobacco Cessation No Change    Warm-up and Cool-down Performed on first and last piece of equipment    Resistance Training Performed Yes    VAD Patient? No    PAD/SET Patient? No      Pain Assessment   Currently in Pain? No/denies             Social History   Tobacco Use  Smoking Status Former   Current packs/day: 0.00   Average packs/day: 1.5 packs/day for 52.5 years (78.8 ttl pk-yrs)   Types: Cigarettes   Start date: 05/10/1941   Quit date: 11/09/1993   Years since quitting: 29.9  Smokeless Tobacco Never    Goals Met:  Proper associated with RPD/PD & O2 Sat Independence with exercise equipment Using PLB without cueing & demonstrates good technique Exercise tolerated well No report of concerns or symptoms today Strength training completed today  Goals Unmet:  Not Applicable  Comments: First full day of exercise!  Patient was oriented to gym and equipment including functions, settings, policies, and procedures.  Patient's individual exercise prescription and treatment plan were reviewed.  All starting workloads were established based on the results of the 6 minute walk test done at initial orientation  visit.  The plan for exercise progression was also introduced and progression will be customized based on patient's performance and goals.    Dr. Oneil Pinal is Medical Director for [facility] Cardiac Rehabilitation.  Dr. Fuad Aleskerov is Medical Director for [facility] Pulmonary Rehabilitation.

## 2023-10-17 ENCOUNTER — Encounter

## 2023-10-17 DIAGNOSIS — J449 Chronic obstructive pulmonary disease, unspecified: Secondary | ICD-10-CM

## 2023-10-17 NOTE — Progress Notes (Signed)
 Daily Session Note  Patient Details  Name: Tanya Rhodes MRN: [id number] Date of Birth: 16-Sep-1940 Referring Provider:   Flowsheet Row Pulmonary Rehab from 10/09/2023 in [facility] Cardiac and Pulmonary Rehab  Referring Provider Dr. Lavelle Servant    Encounter Date: 10/17/2023  Check In:  Session Check In - 10/17/23 1358       Check-In   Supervising physician immediately available to respond to emergencies See telemetry face sheet for immediately available ER MD    Location [facility]-Cardiac & Pulmonary Rehab    Staff Present Devaughn Jaeger, BS, Exercise Physiologist;Kelly Dyane HECKLE, ACSM CEP, Exercise Physiologist;Haislee Corso RN,BSN;Kelly Bollinger [facility]    Virtual Visit No    Medication changes reported     No    Fall or balance concerns reported    No    Tobacco Cessation No Change    Warm-up and Cool-down Performed on first and last piece of equipment    Resistance Training Performed Yes    VAD Patient? No    PAD/SET Patient? No      Pain Assessment   Currently in Pain? No/denies             Social History   Tobacco Use  Smoking Status Former   Current packs/day: 0.00   Average packs/day: 1.5 packs/day for 52.5 years (78.8 ttl pk-yrs)   Types: Cigarettes   Start date: 05/10/1941   Quit date: 11/09/1993   Years since quitting: 29.9  Smokeless Tobacco Never    Goals Met:  Proper associated with RPD/PD & O2 Sat Independence with exercise equipment Using PLB without cueing & demonstrates good technique Exercise tolerated well No report of concerns or symptoms today Strength training completed today  Goals Unmet:  Not Applicable  Comments: Pt able to follow exercise prescription today without complaint.  Will continue to monitor for progression.    Dr. Oneil Pinal is Medical Director for [facility] Cardiac Rehabilitation.  Dr. Fuad Aleskerov is Medical Director for [facility] Pulmonary Rehabilitation.

## 2023-10-18 ENCOUNTER — Encounter

## 2023-10-18 DIAGNOSIS — J449 Chronic obstructive pulmonary disease, unspecified: Secondary | ICD-10-CM

## 2023-10-18 NOTE — Progress Notes (Signed)
 Daily Session Note  Patient Details  Name: Tanya Rhodes MRN: [id number] Date of Birth: [birth year] Referring Provider:   Flowsheet Row Pulmonary Rehab from 10/09/2023 in [facility] Cardiac and Pulmonary Rehab  Referring Provider Dr. Lavelle Servant    Encounter Date: 10/18/2023  Check In:  Session Check In - 10/18/23 1347       Check-In   Supervising physician immediately available to respond to emergencies See telemetry face sheet for immediately available ER MD    Location [facility]-Cardiac & Pulmonary Rehab    Staff Present Leita Franks RN,BSN;Maxon Conetta BS, Exercise Physiologist;Noah Tickle, BS, Exercise Physiologist    Virtual Visit No    Medication changes reported     No    Fall or balance concerns reported    No    Tobacco Cessation No Change    Warm-up and Cool-down Performed on first and last piece of equipment    Resistance Training Performed Yes    VAD Patient? No    PAD/SET Patient? No      Pain Assessment   Currently in Pain? No/denies             Social History   Tobacco Use  Smoking Status Former   Current packs/day: 0.00   Average packs/day: 1.5 packs/day for 52.5 years (78.8 ttl pk-yrs)   Types: Cigarettes   Start date: 05/10/1941   Quit date: 11/09/1993   Years since quitting: 29.9  Smokeless Tobacco Never    Goals Met:  Proper associated with RPD/PD & O2 Sat Independence with exercise equipment Using PLB without cueing & demonstrates good technique Exercise tolerated well No report of concerns or symptoms today Strength training completed today  Goals Unmet:  Not Applicable  Comments: Pt able to follow exercise prescription today without complaint.  Will continue to monitor for progression.    Dr. Oneil Pinal is Medical Director for [facility] Cardiac Rehabilitation.  Dr. Fuad Aleskerov is Medical Director for [facility] Pulmonary Rehabilitation.

## 2023-10-22 ENCOUNTER — Encounter: Admitting: Emergency Medicine

## 2023-10-22 DIAGNOSIS — J449 Chronic obstructive pulmonary disease, unspecified: Secondary | ICD-10-CM | POA: Diagnosis not present

## 2023-10-22 NOTE — Progress Notes (Signed)
 Daily Session Note  Patient Details  Name: Tanya Rhodes MRN: [id number] Date of Birth: 05-Apr-1940 Referring Provider:   Flowsheet Row Pulmonary Rehab from 10/09/2023 in [facility] Cardiac and Pulmonary Rehab  Referring Provider Dr. Lavelle Servant    Encounter Date: 10/22/2023  Check In:  Session Check In - 10/22/23 1338       Check-In   Supervising physician immediately available to respond to emergencies See telemetry face sheet for immediately available ER MD    Location [facility]-Cardiac & Pulmonary Rehab    Staff Present Leita Franks RN,BSN;Maxon Conetta BS, Exercise Physiologist;Noah Tickle, BS, Exercise Physiologist;Margaret Best, MS, Exercise Physiologist    Virtual Visit No    Medication changes reported     No    Fall or balance concerns reported    No    Tobacco Cessation No Change    Warm-up and Cool-down Performed on first and last piece of equipment    Resistance Training Performed Yes    VAD Patient? No    PAD/SET Patient? No      Pain Assessment   Currently in Pain? No/denies             Social History   Tobacco Use  Smoking Status Former   Current packs/day: 0.00   Average packs/day: 1.5 packs/day for 52.5 years (78.8 ttl pk-yrs)   Types: Cigarettes   Start date: 05/10/1941   Quit date: 11/09/1993   Years since quitting: 29.9  Smokeless Tobacco Never    Goals Met:  Proper associated with RPD/PD & O2 Sat Independence with exercise equipment Using PLB without cueing & demonstrates good technique Exercise tolerated well No report of concerns or symptoms today Strength training completed today  Goals Unmet:  Not Applicable  Comments: Pt able to follow exercise prescription today without complaint.  Will continue to monitor for progression.    Dr. Oneil Pinal is Medical Director for [facility] Cardiac Rehabilitation.  Dr. Fuad Aleskerov is Medical Director for [facility] Pulmonary Rehabilitation.

## 2023-10-24 ENCOUNTER — Encounter

## 2023-10-24 DIAGNOSIS — J449 Chronic obstructive pulmonary disease, unspecified: Secondary | ICD-10-CM

## 2023-10-24 NOTE — Progress Notes (Signed)
 Daily Session Note  Patient Details  Name: Tanya Rhodes MRN: [id number] Date of Birth: [birth year] Referring Provider:   Flowsheet Row Pulmonary Rehab from 10/09/2023 in [facility] Cardiac and Pulmonary Rehab  Referring Provider Dr. Lavelle Servant    Encounter Date: 10/24/2023  Check In:  Session Check In - 10/24/23 1354       Check-In   Supervising physician immediately available to respond to emergencies See telemetry face sheet for immediately available ER MD    Location [facility]-Cardiac & Pulmonary Rehab    Staff Present Burnard Davenport RN,BSN,MPA;Margaret Best, MS, Exercise Physiologist;Noah Tickle, BS, Exercise Physiologist;Meredith Tressa RN,BSN    Virtual Visit No    Medication changes reported     No    Fall or balance concerns reported    No    Tobacco Cessation No Change    Warm-up and Cool-down Performed on first and last piece of equipment    Resistance Training Performed Yes    VAD Patient? No    PAD/SET Patient? No      Pain Assessment   Currently in Pain? No/denies             Social History   Tobacco Use  Smoking Status Former   Current packs/day: 0.00   Average packs/day: 1.5 packs/day for 52.5 years (78.8 ttl pk-yrs)   Types: Cigarettes   Start date: 05/10/1941   Quit date: 11/09/1993   Years since quitting: 29.9  Smokeless Tobacco Never    Goals Met:  Proper associated with RPD/PD & O2 Sat Independence with exercise equipment Using PLB without cueing & demonstrates good technique Exercise tolerated well No report of concerns or symptoms today Strength training completed today  Goals Unmet:  Not Applicable  Comments: Pt able to follow exercise prescription today without complaint.  Will continue to monitor for progression.    Dr. Oneil Pinal is Medical Director for [facility] Cardiac Rehabilitation.  Dr. Fuad Aleskerov is Medical Director for [facility] Pulmonary Rehabilitation.

## 2023-10-25 ENCOUNTER — Encounter: Admitting: *Deleted

## 2023-10-25 DIAGNOSIS — J449 Chronic obstructive pulmonary disease, unspecified: Secondary | ICD-10-CM

## 2023-10-25 NOTE — Progress Notes (Signed)
 Daily Session Note  Patient Details  Name: Tanya Rhodes MRN: [id number] Date of Birth: 01/03/1941 Referring Provider:   Flowsheet Row Pulmonary Rehab from 10/09/2023 in [facility] Cardiac and Pulmonary Rehab  Referring Provider Dr. Lavelle Servant    Encounter Date: 10/25/2023  Check In:  Session Check In - 10/25/23 1402       Check-In   Supervising physician immediately available to respond to emergencies See telemetry face sheet for immediately available ER MD    Location [facility]-Cardiac & Pulmonary Rehab    Staff Present Hoy Rodney RN,BSN;Noah Tickle, BS, Exercise Physiologist;Joseph Hood RCP,RRT,BSRT;Maxon Conetta BS, Exercise Physiologist    Virtual Visit No    Medication changes reported     No    Fall or balance concerns reported    No    Warm-up and Cool-down Performed on first and last piece of equipment    Resistance Training Performed Yes    VAD Patient? No    PAD/SET Patient? No      Pain Assessment   Currently in Pain? No/denies             Social History   Tobacco Use  Smoking Status Former   Current packs/day: 0.00   Average packs/day: 1.5 packs/day for 52.5 years (78.8 ttl pk-yrs)   Types: Cigarettes   Start date: 05/10/1941   Quit date: 11/09/1993   Years since quitting: 29.9  Smokeless Tobacco Never    Goals Met:  Independence with exercise equipment Exercise tolerated well No report of concerns or symptoms today Strength training completed today  Goals Unmet:  Not Applicable  Comments: Pt able to follow exercise prescription today without complaint.  Will continue to monitor for progression.    Dr. Oneil Pinal is Medical Director for [facility] Cardiac Rehabilitation.  Dr. Fuad Aleskerov is Medical Director for [facility] Pulmonary Rehabilitation.

## 2023-10-29 ENCOUNTER — Encounter: Admitting: Emergency Medicine

## 2023-10-29 DIAGNOSIS — J449 Chronic obstructive pulmonary disease, unspecified: Secondary | ICD-10-CM

## 2023-10-29 NOTE — Progress Notes (Signed)
 Daily Session Note  Patient Details  Name: ELMARIE DEVLIN MRN: [id number] Date of Birth: 1940-12-12 Referring Provider:   Flowsheet Row Pulmonary Rehab from 10/09/2023 in [facility] Cardiac and Pulmonary Rehab  Referring Provider Dr. Lavelle Servant    Encounter Date: 10/29/2023  Check In:  Session Check In - 10/29/23 1351       Check-In   Supervising physician immediately available to respond to emergencies See telemetry face sheet for immediately available ER MD    Location [facility]-Cardiac & Pulmonary Rehab    Staff Present Devaughn Jaeger, BS, Exercise Physiologist;Jaquin Coy RN,BSN;Maxon Henrieville BS, Exercise Physiologist;Margaret Best, MS, Exercise Physiologist;Jason Elnor RDN,LDN    Virtual Visit No    Medication changes reported     No    Fall or balance concerns reported    No    Tobacco Cessation No Change    Warm-up and Cool-down Performed on first and last piece of equipment    Resistance Training Performed Yes    VAD Patient? No    PAD/SET Patient? No      Pain Assessment   Currently in Pain? No/denies             Social History   Tobacco Use  Smoking Status Former   Current packs/day: 0.00   Average packs/day: 1.5 packs/day for 52.5 years (78.8 ttl pk-yrs)   Types: Cigarettes   Start date: 05/10/1941   Quit date: 11/09/1993   Years since quitting: 29.9  Smokeless Tobacco Never    Goals Met:  Proper associated with RPD/PD & O2 Sat Independence with exercise equipment Using PLB without cueing & demonstrates good technique Exercise tolerated well No report of concerns or symptoms today Strength training completed today  Goals Unmet:  Not Applicable  Comments: Pt able to follow exercise prescription today without complaint.  Will continue to monitor for progression.    Dr. Oneil Pinal is Medical Director for [facility] Cardiac Rehabilitation.  Dr. Fuad Aleskerov is Medical Director for [facility] Pulmonary Rehabilitation.

## 2023-10-31 ENCOUNTER — Encounter

## 2023-10-31 DIAGNOSIS — J449 Chronic obstructive pulmonary disease, unspecified: Secondary | ICD-10-CM

## 2023-10-31 NOTE — Progress Notes (Signed)
 Daily Session Note  Patient Details  Name: Tanya Rhodes MRN: [id number] Date of Birth: 1940-07-12 Referring Provider:   Flowsheet Row Pulmonary Rehab from 10/09/2023 in [facility] Cardiac and Pulmonary Rehab  Referring Provider Dr. Lavelle Servant    Encounter Date: 10/31/2023  Check In:  Session Check In - 10/31/23 1354       Check-In   Supervising physician immediately available to respond to emergencies See telemetry face sheet for immediately available ER MD    Location [facility]-Cardiac & Pulmonary Rehab    Staff Present Burnard Davenport RN,BSN,MPA;Laura Cates RN,BSN;Macyn Shropshire Dyane BS, ACSM CEP, Exercise Physiologist;Noah Tickle, BS, Exercise Physiologist    Virtual Visit No    Medication changes reported     No    Fall or balance concerns reported    No    Tobacco Cessation No Change    Warm-up and Cool-down Performed on first and last piece of equipment    Resistance Training Performed Yes    VAD Patient? No    PAD/SET Patient? No      Pain Assessment   Currently in Pain? No/denies             Social History   Tobacco Use  Smoking Status Former   Current packs/day: 0.00   Average packs/day: 1.5 packs/day for 52.5 years (78.8 ttl pk-yrs)   Types: Cigarettes   Start date: 05/10/1941   Quit date: 11/09/1993   Years since quitting: 29.9  Smokeless Tobacco Never    Goals Met:  Proper associated with RPD/PD & O2 Sat Independence with exercise equipment Using PLB without cueing & demonstrates good technique Exercise tolerated well No report of concerns or symptoms today Strength training completed today  Goals Unmet:  Not Applicable  Comments: Pt able to follow exercise prescription today without complaint.  Will continue to monitor for progression.    Dr. Oneil Pinal is Medical Director for [facility] Cardiac Rehabilitation.  Dr. Fuad Aleskerov is Medical Director for [facility] Pulmonary Rehabilitation.

## 2023-10-31 NOTE — Progress Notes (Signed)
 Pulmonary Individual Treatment Plan  Patient Details  Name: Tanya Rhodes MRN: [id number] Date of Birth: Sep 09, 1940 Referring Provider:   Flowsheet Row Pulmonary Rehab from 10/09/2023 in [facility] Cardiac and Pulmonary Rehab  Referring Provider Dr. Lavelle Servant    Initial Encounter Date:  Flowsheet Row Pulmonary Rehab from 10/09/2023 in [facility] Cardiac and Pulmonary Rehab  Date 10/09/23    Visit Diagnosis: COPD, moderate (HCC)  Patient's Home Medications on Admission:  Current Outpatient Medications:    acetaminophen  (TYLENOL ) 500 MG tablet, Take 500 mg by mouth every 6 (six) hours as needed for mild pain, moderate pain, fever or headache. , Disp: , Rfl:    albuterol  (PROVENTIL  HFA;VENTOLIN  HFA) 108 (90 Base) MCG/ACT inhaler, Inhale 2 puffs into the lungs every 4 (four) hours as needed for wheezing or shortness of breath., Disp: , Rfl:    albuterol  (PROVENTIL ) (2.5 MG/3ML) 0.083% nebulizer solution, Take 2.5 mg by nebulization every 6 (six) hours as needed for wheezing or shortness of breath., Disp: , Rfl:    ALPRAZolam  (XANAX ) 0.5 MG tablet, Take 0.5 mg by mouth daily as needed. (Patient not taking: Reported on 10/03/2023), Disp: , Rfl:    ALPRAZolam  (XANAX ) 0.5 MG tablet, Take by mouth. (Patient not taking: Reported on 10/03/2023), Disp: , Rfl:    ALPRAZolam  (XANAX ) 0.5 MG tablet, Take 0.5 mg by mouth., Disp: , Rfl:    amLODipine (NORVASC) 2.5 MG tablet, Take 2.5 mg by mouth., Disp: , Rfl:    budesonide -formoterol  (SYMBICORT ) 160-4.5 MCG/ACT inhaler, Inhale 2 puffs into the lungs 2 (two) times daily., Disp: , Rfl:    cyanocobalamin  1000 MCG tablet, Take 1,000 mcg by mouth daily., Disp: , Rfl:    escitalopram  (LEXAPRO ) 10 MG tablet, Take 10 mg by mouth at bedtime. (Patient not taking: Reported on 10/03/2023), Disp: , Rfl: 0   fluticasone  (FLONASE ) 50 MCG/ACT nasal spray, Place 2 sprays into both nostrils at bedtime., Disp: , Rfl:    furosemide  (LASIX ) 20 MG tablet, Take 20 mg by mouth at  bedtime. (Patient not taking: Reported on 10/03/2023), Disp: , Rfl:    gabapentin  (NEURONTIN ) 300 MG capsule, Take 300 mg by mouth at bedtime., Disp: , Rfl:    lisinopril  (PRINIVIL ,ZESTRIL ) 2.5 MG tablet, Take 2.5 mg by mouth at bedtime., Disp: , Rfl:    lisinopril  (ZESTRIL ) 2.5 MG tablet, Take 1 tablet by mouth daily. (Patient not taking: Reported on 10/03/2023), Disp: , Rfl:    loperamide (IMODIUM) 2 MG capsule, Take 2 mg by mouth as needed for diarrhea or loose stools., Disp: , Rfl:    metoprolol  succinate (TOPROL -XL) 25 MG 24 hr tablet, Take 25 mg by mouth daily. (Patient not taking: Reported on 10/03/2023), Disp: , Rfl:    OXYGEN , Inhale 2 L into the lungs continuous., Disp: , Rfl:    pantoprazole (PROTONIX) 40 MG tablet, Take 1 tablet by mouth at bedtime. (Patient not taking: Reported on 10/03/2023), Disp: , Rfl:    pantoprazole (PROTONIX) 40 MG tablet, Take 1 tablet by mouth daily., Disp: , Rfl:    simvastatin  (ZOCOR ) 20 MG tablet, Take 20 mg by mouth at bedtime. (Patient not taking: Reported on 10/03/2023), Disp: , Rfl:    SPIRIVA  RESPIMAT 2.5 MCG/ACT AERS, Inhale 2 puffs into the lungs every morning. (Patient not taking: Reported on 10/03/2023), Disp: , Rfl: 0   Tiotropium Bromide  Monohydrate (SPIRIVA  RESPIMAT) 2.5 MCG/ACT AERS, Inhale 5 mcg into the lungs., Disp: , Rfl:    traMADol  (ULTRAM ) 50 MG tablet, Take 50 mg  by mouth 2 (two) times daily as needed for moderate pain. (Patient not taking: Reported on 10/03/2023), Disp: , Rfl:    traMADol  (ULTRAM ) 50 MG tablet, Take 50 mg by mouth., Disp: , Rfl:    Vitamin D , Ergocalciferol , (DRISDOL ) 50000 units CAPS capsule, Take 50,000 Units by mouth every 7 (seven) days. 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   a.) CNB 07/21/2022 --> pathology (+) for ER/PR + ADH   Centrilobular emphysema (HCC)    Chronic systolic CHF (congestive heart failure) (HCC)    a.) TTE 03/31/2015: EF 20-25%, diffuse HK, mod LAE, sev LVE, mild RAE/RVE, mod TR; b.) MV 05/14/2015: EF 32%; c.) TTE 12/12/2016: EF 45%, no RWMAs, norm RVSF, mild MR/TR; d.) TTE 08/24/2022: EF 40%, sep HK, G1DD, norm RVSF, triv AR/TR, mild MR; e.) MV 08/24/2022: EF 49%   Colon polyposis    a.) s/p partial colectomy 2013   COPD (chronic obstructive pulmonary disease) (HCC)    Coronary artery disease involving native coronary artery of native heart without angina pectoris    a.) LHC 07/06/2008: 20% mLAD, 95% pRCA (3.5 x 18 mm Vision BMS); b.) MV 05/14/2015: no ischemia; c.) MV 08/24/2022: small reversible perfusion abnormality in the inferior region consistent with mild inferior ischemia.   Cortical senile cataract    DDD (degenerative disc disease), lumbar    Depression    Dilated cardiomyopathy (HCC)    a.) TTE 03/31/2015: EF 20-25%; b.) MV 05/14/2015: EF 32%; c.) TTE 12/12/2016: EF 45%; d.) TTE 08/24/2022: EF 40%; e.) MV 08/24/2022: EF 49%   Dyspnea    Former smoker    GERD (gastroesophageal reflux disease)    HLD (hyperlipidemia)    Hypertension    IDA (iron  deficiency anemia)    Internal hemorrhoids    LBBB (left bundle branch block)    Levoscoliosis of thoracic spine    Low serum vitamin D     Lumbar radiculitis    Lumbar spondylitis    Lumbar stenosis with neurogenic claudication    Melena    Osteoarthritis    PUD (peptic ulcer disease)    Pulmonary nodule    Restless leg    Status post left rotator cuff repair    Supplemental oxygen  dependent    a.) 2L/[place] continuous   Unintentional weight loss    Vitamin B12 deficiency     Tobacco Use: Social History   Tobacco Use  Smoking Status Former   Current packs/day: 0.00   Average packs/day: 1.5  packs/day for 52.5 years (78.8 ttl pk-yrs)   Types: Cigarettes   Start date: 05/10/1941   Quit date: 11/09/1993   Years since quitting: 29.9  Smokeless Tobacco Never    Labs: Review Flowsheet       Latest Ref Rng & Units 10/27/2013  Labs for ITP Cardiac and Pulmonary Rehab  Hemoglobin A1c 4.2 - 6.3 % 5.9      Pulmonary Assessment Scores:  Pulmonary Assessment Scores     Row Name 10/09/23 1341         CAT Score   CAT Score 20        UCSD: Self-administered rating  of dyspnea associated with activities of daily living (ADLs) 6-point scale (0 = not at all to 5 = maximal or unable to do because of breathlessness)  Scoring Scores range from 0 to 120.  Minimally important difference is 5 units  CAT: CAT can identify the health impairment of COPD patients and is better correlated with disease progression.  CAT has a scoring range of zero to 40. The CAT score is classified into four groups of low (less than 10), medium (10 - 20), high (21-30) and very high (31-40) based on the impact level of disease on health status. A CAT score over 10 suggests significant symptoms.  A worsening CAT score could be explained by an exacerbation, poor medication adherence, poor inhaler technique, or progression of COPD or comorbid conditions.  CAT MCID is 2 points  mMRC: mMRC (Modified Medical Research Council) Dyspnea Scale is used to assess the degree of baseline functional disability in patients of respiratory disease due to dyspnea. No minimal important difference is established. A decrease in score of 1 point or greater is considered a positive change.   Pulmonary Function Assessment:  Pulmonary Function Assessment - 10/03/23 1423       Pulmonary Function Tests   FEV1% 51 %    FEV1/FVC Ratio 45      Breath   Shortness of Breath Yes;Limiting activity          Exercise Target Goals: Exercise Program Goal: Individual exercise prescription set using results from initial 6 min walk  test and THRR while considering  patient's activity barriers and safety.   Exercise Prescription Goal: Initial exercise prescription builds to 30-45 minutes a day of aerobic activity, 2-3 days per week.  Home exercise guidelines will be given to patient during program as part of exercise prescription that the participant will acknowledge.  Education: Aerobic Exercise: - Group verbal and visual presentation on the components of exercise prescription. Introduces F.I.T.T principle from ACSM for exercise prescriptions.  Reviews F.I.T.T. principles of aerobic exercise including progression. Written material provided at class time. Flowsheet Row Pulmonary Rehab from 10/24/2023 in [facility] Cardiac and Pulmonary Rehab  Date 10/24/23  Educator mb  Instruction Review Code 1- Verbalizes Understanding    Education: Resistance Exercise: - Group verbal and visual presentation on the components of exercise prescription. Introduces F.I.T.T principle from ACSM for exercise prescriptions  Reviews F.I.T.T. principles of resistance exercise including progression. Written material provided at class time.    Education: Exercise & Equipment Safety: - Individual verbal instruction and demonstration of equipment use and safety with use of the equipment. Flowsheet Row Pulmonary Rehab from 10/24/2023 in [facility] Cardiac and Pulmonary Rehab  Date 10/09/23  Educator [facility]  Instruction Review Code 1- Verbalizes Understanding    Education: Exercise Physiology & General Exercise Guidelines: - Group verbal and written instruction with models to review the exercise physiology of the cardiovascular system and associated critical values. Provides general exercise guidelines with specific guidelines to those with heart or lung disease.    Education: Flexibility, Balance, Mind/Body Relaxation: - Group verbal and visual presentation with interactive activity on the components of exercise prescription. Introduces F.I.T.T principle from  ACSM for exercise prescriptions. Reviews F.I.T.T. principles of flexibility and balance exercise training including progression. Also discusses the mind body connection.  Reviews various relaxation techniques to help reduce and manage stress (i.e. Deep breathing, progressive muscle relaxation, and visualization). Balance handout provided to take home. Written material provided at class time. Flowsheet Row Pulmonary Rehab from 10/24/2023 in [facility]  Cardiac and Pulmonary Rehab  Date 10/17/23  Educator [facility]  Instruction Review Code 1- Verbalizes Understanding    Activity Barriers & Risk Stratification:  Activity Barriers & Cardiac Risk Stratification - 10/09/23 1516       Activity Barriers & Cardiac Risk Stratification   Activity Barriers Shortness of Breath;History of Falls          6 Minute Walk:  6 Minute Walk     Row Name 10/09/23 1513         6 Minute Walk   Phase Initial     Distance 770 feet     Walk Time 6 minutes     # of Rest Breaks 0     MPH 1.45     METS 1.61     RPE 13     Perceived Dyspnea  3     VO2 Peak 5.64     Symptoms No     Resting HR 91 bpm     Resting BP 146/62     Resting Oxygen  Saturation  93 %     Exercise Oxygen  Saturation  during 6 min walk 90 %     Max Ex. HR 102 bpm     Max Ex. BP 142/68     2 Minute Post BP 138/62       Interval HR   1 Minute HR 90     2 Minute HR 91     3 Minute HR 93     4 Minute HR 99     5 Minute HR 99     6 Minute HR 102     2 Minute Post HR 86     Interval Heart Rate? Yes       Interval Oxygen    Interval Oxygen ? Yes     Baseline Oxygen  Saturation % 93 %     1 Minute Oxygen  Saturation % 97 %     1 Minute Liters of Oxygen  2 L     2 Minute Oxygen  Saturation % 97 %     2 Minute Liters of Oxygen  2 L     3 Minute Oxygen  Saturation % 90 %     3 Minute Liters of Oxygen  2 L     4 Minute Oxygen  Saturation % 91 %     4 Minute Liters of Oxygen  2 L     5 Minute Oxygen  Saturation % 92 %     5 Minute Liters of Oxygen  2 L      6 Minute Oxygen  Saturation % 96 %     6 Minute Liters of Oxygen  2 L     2 Minute Post Oxygen  Saturation % 98 %     2 Minute Post Liters of Oxygen  2 L       Oxygen  Initial Assessment:  Oxygen  Initial Assessment - 10/03/23 1422       Home Oxygen    Home Oxygen  Device Home Concentrator;Portable Concentrator;E-Tanks    Sleep Oxygen  Prescription Continuous    Liters per minute 2    Home Exercise Oxygen  Prescription Continuous    Liters per minute 2    Home Resting Oxygen  Prescription Continuous    Liters per minute 2    Compliance with Home Oxygen  Use Yes      Initial 6 min Walk   Oxygen  Used Continuous    Liters per minute 2      Program Oxygen  Prescription   Program Oxygen  Prescription Continuous    Liters per minute  2      Intervention   Short Term Goals To learn and exhibit compliance with exercise, home and travel O2 prescription;To learn and understand importance of monitoring SPO2 with pulse oximeter and demonstrate accurate use of the pulse oximeter.;To learn and demonstrate proper pursed lip breathing techniques or other breathing techniques. ;To learn and understand importance of maintaining oxygen  saturations>88%;To learn and demonstrate proper use of respiratory medications    Long  Term Goals Exhibits compliance with exercise, home  and travel O2 prescription;Maintenance of O2 saturations>88%;Compliance with respiratory medication;Demonstrates proper use of MDI's;Exhibits proper breathing techniques, such as pursed lip breathing or other method taught during program session;Verbalizes importance of monitoring SPO2 with pulse oximeter and return demonstration          Oxygen  Re-Evaluation:  Oxygen  Re-Evaluation     Row Name 10/15/23 1413 10/25/23 1423           Program Oxygen  Prescription   Program Oxygen  Prescription -- Continuous;E-Tanks      Liters per minute -- 2        Home Oxygen    Home Oxygen  Device -- Home Concentrator;Portable Concentrator;E-Tanks       Sleep Oxygen  Prescription -- Continuous      Liters per minute -- 2      Home Exercise Oxygen  Prescription -- Continuous      Liters per minute -- 2      Home Resting Oxygen  Prescription -- Continuous      Liters per minute -- 2      Compliance with Home Oxygen  Use -- Yes        Goals/Expected Outcomes   Short Term Goals -- To learn and demonstrate proper pursed lip breathing techniques or other breathing techniques.       Long  Term Goals -- Exhibits proper breathing techniques, such as pursed lip breathing or other method taught during program session      Comments Reviewed PLB technique with pt.  Talked about how it works and it's importance in maintaining their exercise saturations. Informed patient how to perform the Pursed Lipped breathing technique. Told patient to Inhale through the nose and out the mouth with pursed lips to keep their airways open, help oxygenate them better, practice when at rest or doing strenuous activity. Patient Verbalizes understanding of technique and will work on and be reiterated during [facility].      Goals/Expected Outcomes Short: Become more profiecient at using PLB.   Long: Become independent at using PLB. Short: use PLB with exertion. Long: use PLB on exertion proficiently and independently.         Oxygen  Discharge (Final Oxygen  Re-Evaluation):  Oxygen  Re-Evaluation - 10/25/23 1423       Program Oxygen  Prescription   Program Oxygen  Prescription Continuous;E-Tanks    Liters per minute 2      Home Oxygen    Home Oxygen  Device Home Concentrator;Portable Concentrator;E-Tanks    Sleep Oxygen  Prescription Continuous    Liters per minute 2    Home Exercise Oxygen  Prescription Continuous    Liters per minute 2    Home Resting Oxygen  Prescription Continuous    Liters per minute 2    Compliance with Home Oxygen  Use Yes      Goals/Expected Outcomes   Short Term Goals To learn and demonstrate proper pursed lip breathing techniques or other breathing  techniques.     Long  Term Goals Exhibits proper breathing techniques, such as pursed lip breathing or other method taught during program session  Comments Informed patient how to perform the Pursed Lipped breathing technique. Told patient to Inhale through the nose and out the mouth with pursed lips to keep their airways open, help oxygenate them better, practice when at rest or doing strenuous activity. Patient Verbalizes understanding of technique and will work on and be reiterated during [facility].    Goals/Expected Outcomes Short: use PLB with exertion. Long: use PLB on exertion proficiently and independently.          Initial Exercise Prescription:  Initial Exercise Prescription - 10/09/23 1500       Date of Initial Exercise RX and Referring Provider   Date 10/09/23    Referring Provider Dr. Lavelle Servant      Oxygen    Oxygen  Continuous    Liters 2L    Maintain Oxygen  Saturation 88% or higher      Recumbant Bike   Level 2    RPM 50    Watts 25    Minutes 15    METs 1.61      NuStep   Level 2    SPM 80    Minutes 15    METs 1.61      REL-XR   Level 2    Watts 25    Speed 50    Minutes 15    METs 1.61      T5 Nustep   Level 2    SPM 80    Minutes 15    METs 1.61      Biostep-RELP   Level 2    SPM 50    Minutes 15    METs 1.61      Track   Laps 20    Minutes 15    METs 2.09      Prescription Details   Duration Progress to 30 minutes of continuous aerobic without signs/symptoms of physical distress      Intensity   THRR 40-80% of Max Heartrate 109-127    Ratings of Perceived Exertion 11-13    Perceived Dyspnea 0-4      Progression   Progression Continue to progress workloads to maintain intensity without signs/symptoms of physical distress.      Resistance Training   Training Prescription Yes    Weight 1lb    Reps 10-15          Perform Capillary Blood Glucose checks as needed.  Exercise Prescription Changes:   Exercise  Prescription Changes     Row Name 10/09/23 1500 10/25/23 0800           Response to Exercise   Blood Pressure (Admit) 146/62 132/62      Blood Pressure (Exercise) 142/68 154/68      Blood Pressure (Exit) 138/62 132/64      Heart Rate (Admit) 91 bpm 86 bpm      Heart Rate (Exercise) 102 bpm 101 bpm      Heart Rate (Exit) 86 bpm 93 bpm      Oxygen  Saturation (Admit) 93 % 98 %      Oxygen  Saturation (Exercise) 90 % 95 %      Oxygen  Saturation (Exit) 98 % 98 %      Rating of Perceived Exertion (Exercise) 13 15      Perceived Dyspnea (Exercise) 3 3      Symptoms none none      Comments results first 2 weeks of exercise      Duration -- Progress to 30 minutes of  aerobic without signs/symptoms  of physical distress      Intensity -- THRR unchanged        Progression   Progression -- Continue to progress workloads to maintain intensity without signs/symptoms of physical distress.      Average METs -- 2.1        Resistance Training   Training Prescription -- Yes      Weight -- 1lb      Reps -- 10-15        Interval Training   Interval Training -- No        Oxygen    Oxygen  -- Continuous      Liters -- 2L        Recumbant Bike   Level -- 1      Watts -- 13      Minutes -- 15      METs -- 2.87        NuStep   Level -- 1      Minutes -- 15        T5 Nustep   Level -- 2      Minutes -- 15      METs -- 2        Biostep-RELP   Level -- 2      Minutes -- 15      METs -- 2        Track   Laps -- 8  Hall      Minutes -- 15      METs -- 1.44        Oxygen    Maintain Oxygen  Saturation -- 88% or higher         Exercise Comments:   Exercise Comments     Row Name 10/15/23 1412           Exercise Comments First full day of exercise!  Patient was oriented to gym and equipment including functions, settings, policies, and procedures.  Patient's individual exercise prescription and treatment plan were reviewed.  All starting workloads were established based on  the results of the 6 minute walk test done at initial orientation visit.  The plan for exercise progression was also introduced and progression will be customized based on patient's performance and goals.          Exercise Goals and Review:   Exercise Goals     Row Name 10/09/23 1520             Exercise Goals   Increase Physical Activity Yes       Intervention Provide advice, education, support and counseling about physical activity/exercise needs.;Develop an individualized exercise prescription for aerobic and resistive training based on initial evaluation findings, risk stratification, comorbidities and participant's personal goals.       Expected Outcomes Short Term: Attend rehab on a regular basis to increase amount of physical activity.;Long Term: Add in home exercise to make exercise part of routine and to increase amount of physical activity.;Long Term: Exercising regularly at least 3-5 days a week.       Increase Strength and Stamina Yes       Intervention Provide advice, education, support and counseling about physical activity/exercise needs.;Develop an individualized exercise prescription for aerobic and resistive training based on initial evaluation findings, risk stratification, comorbidities and participant's personal goals.       Expected Outcomes Short Term: Increase workloads from initial exercise prescription for resistance, speed, and METs.;Short Term: Perform resistance training exercises routinely during rehab and add in resistance training at home;Long  Term: Improve cardiorespiratory fitness, muscular endurance and strength as measured by increased METs and functional capacity ( )       Able to understand and use rate of perceived exertion (RPE) scale Yes       Intervention Provide education and explanation on how to use RPE scale       Expected Outcomes Short Term: Able to use RPE daily in rehab to express subjective intensity level;Long Term:  Able to use RPE to  guide intensity level when exercising independently       Able to understand and use Dyspnea scale Yes       Intervention Provide education and explanation on how to use Dyspnea scale       Expected Outcomes Short Term: Able to use Dyspnea scale daily in rehab to express subjective sense of shortness of breath during exertion;Long Term: Able to use Dyspnea scale to guide intensity level when exercising independently       Knowledge and understanding of Target Heart Rate Range (THRR) Yes       Intervention Provide education and explanation of THRR including how the numbers were predicted and where they are located for reference       Expected Outcomes Short Term: Able to state/look up THRR;Short Term: Able to use daily as guideline for intensity in rehab;Long Term: Able to use THRR to govern intensity when exercising independently       Able to check pulse independently Yes       Intervention Provide education and demonstration on how to check pulse in carotid and radial arteries.;Review the importance of being able to check your own pulse for safety during independent exercise       Expected Outcomes Long Term: Able to check pulse independently and accurately;Short Term: Able to explain why pulse checking is important during independent exercise       Understanding of Exercise Prescription Yes       Intervention Provide education, explanation, and written materials on patient's individual exercise prescription       Expected Outcomes Short Term: Able to explain program exercise prescription;Long Term: Able to explain home exercise prescription to exercise independently          Exercise Goals Re-Evaluation :  Exercise Goals Re-Evaluation     Row Name 10/15/23 1412 10/25/23 0841           Exercise Goal Re-Evaluation   Exercise Goals Review Increase Physical Activity;Able to understand and use rate of perceived exertion (RPE) scale;Knowledge and understanding of Target Heart Rate Range  (THRR);Understanding of Exercise Prescription;Able to check pulse independently;Increase Strength and Stamina;Able to understand and use Dyspnea scale Increase Physical Activity;Increase Strength and Stamina;Understanding of Exercise Prescription      Comments Reviewed RPE and dyspnea scale, THR and program prescription with pt today.  Pt voiced understanding and was given a copy of goals to take home. Tanya Rhodes is off to a good start in the program, and was able to attend her first few sessions during this review period. During these sessions she was able to use the T5 nustep, and biostep at level 2, as well as walk 8 laps along the hallway. We will continue to monitor her progress in the program.      Expected Outcomes Short: Use RPE daily to regulate intensity.  Long: Follow program prescription in THR. Short: Continue to follow exercise prescription. Long: Continue exercise to improve strength and stamina.         Discharge Exercise Prescription (Final  Exercise Prescription Changes):  Exercise Prescription Changes - 10/25/23 0800       Response to Exercise   Blood Pressure (Admit) 132/62    Blood Pressure (Exercise) 154/68    Blood Pressure (Exit) 132/64    Heart Rate (Admit) 86 bpm    Heart Rate (Exercise) 101 bpm    Heart Rate (Exit) 93 bpm    Oxygen  Saturation (Admit) 98 %    Oxygen  Saturation (Exercise) 95 %    Oxygen  Saturation (Exit) 98 %    Rating of Perceived Exertion (Exercise) 15    Perceived Dyspnea (Exercise) 3    Symptoms none    Comments first 2 weeks of exercise    Duration Progress to 30 minutes of  aerobic without signs/symptoms of physical distress    Intensity THRR unchanged      Progression   Progression Continue to progress workloads to maintain intensity without signs/symptoms of physical distress.    Average METs 2.1      Resistance Training   Training Prescription Yes    Weight 1lb    Reps 10-15      Interval Training   Interval Training No      Oxygen     Oxygen  Continuous    Liters 2L      Recumbant Bike   Level 1    Watts 13    Minutes 15    METs 2.87      NuStep   Level 1    Minutes 15      T5 Nustep   Level 2    Minutes 15    METs 2      Biostep-RELP   Level 2    Minutes 15    METs 2      Track   Laps 8   Hall   Minutes 15    METs 1.44      Oxygen    Maintain Oxygen  Saturation 88% or higher          Nutrition:  Target Goals: Understanding of nutrition guidelines, daily intake of sodium 1500mg , cholesterol 200mg , calories 30% from fat and 7% or less from saturated fats, daily to have 5 or more servings of fruits and vegetables.  Education: Nutrition 1 -Group instruction provided by verbal, written material, interactive activities, discussions, models, and posters to present general guidelines for heart healthy nutrition including macronutrients, label reading, and promoting whole foods over processed counterparts. Education serves as Pensions consultant of discussion of heart healthy eating for all. Written material provided at class time.     Education: Nutrition 2 -Group instruction provided by verbal, written material, interactive activities, discussions, models, and posters to present general guidelines for heart healthy nutrition including sodium, cholesterol, and saturated fat. Providing guidance of habit forming to improve blood pressure, cholesterol, and body weight. Written material provided at class time.     Biometrics:  Pre Biometrics - 10/09/23 1520       Pre Biometrics   Height 5' 0.6 (1.539 m)    Weight 102 lb 11.2 oz (46.6 kg)    Waist Circumference 29 inches    Hip Circumference 35 inches    Waist to Hip Ratio 0.83 %    BMI (Calculated) 19.67    Single Leg Stand 0 seconds           Nutrition Therapy Plan and Nutrition Goals:   Nutrition Assessments:  MEDIFICTS Score Key: >=70 Need to make dietary changes  40-70 Heart Healthy Diet <= 40 Therapeutic Level Cholesterol  Diet  Flowsheet Row Pulmonary Rehab from 10/09/2023 in [facility] Cardiac [facility] Pulmonary Rehab  Picture Your Plate Total Score on Admission 54   Picture Your Plate Scores: <59 Unhealthy dietary pattern with much room for improvement. 41-50 Dietary pattern unlikely to meet recommendations for good health and room for improvement. 51-60 More healthful dietary pattern, with some room for improvement.  >60 Healthy dietary pattern, although there may be some specific behaviors that could be improved.   Nutrition Goals Re-Evaluation:  Nutrition Goals Re-Evaluation     Row Name 10/25/23 1425             Goals   Comment Patient was informed on why it is important to maintain a balanced diet when dealing with Respiratory issues. Explained that it takes a lot of energy to breath and when they are short of breath often they will need to have a good diet to help keep up with the calories they are expending for breathing.       Expected Outcome Short: Choose and plan snacks accordingly to patients caloric intake to improve breathing. Long: Maintain a diet independently that meets their caloric intake to aid in daily shortness of breath.          Nutrition Goals Discharge (Final Nutrition Goals Re-Evaluation):  Nutrition Goals Re-Evaluation - 10/25/23 1425       Goals   Comment Patient was informed on why it is important to maintain a balanced diet when dealing with Respiratory issues. Explained that it takes a lot of energy to breath and when they are short of breath often they will need to have a good diet to help keep up with the calories they are expending for breathing.    Expected Outcome Short: Choose and plan snacks accordingly to patients caloric intake to improve breathing. Long: Maintain a diet independently that meets their caloric intake to aid in daily shortness of breath.          Psychosocial: Target Goals: Acknowledge presence or absence of significant depression and/or stress,  maximize coping skills, provide positive support system. Participant is able to verbalize types and ability to use techniques and skills needed for reducing stress and depression.   Education: Stress, Anxiety, and Depression - Group verbal and visual presentation to define topics covered.  Reviews how body is impacted by stress, anxiety, and depression.  Also discusses healthy ways to reduce stress and to treat/manage anxiety and depression.  Written material provided at class time.   Education: Sleep Hygiene -Provides group verbal and written instruction about how sleep can affect your health.  Define sleep hygiene, discuss sleep cycles and impact of sleep habits. Review good sleep hygiene tips.    Initial Review & Psychosocial Screening:  Initial Psych Review & Screening - 10/03/23 1426       Initial Review   Current issues with Current Psychotropic Meds;Current Anxiety/Panic      Family Dynamics   Good Support System? Yes    Comments Tanya Rhodes lives with her son and takes care of her if he needs to. She states she has anxiety at times and does not know where it stems from.      Barriers   Psychosocial barriers to participate in program The patient should benefit from training in stress management and relaxation.;Psychosocial barriers identified (see note)      Screening Interventions   Interventions Provide feedback about the scores to participant;To provide support and resources with identified psychosocial needs;Encouraged to exercise  Expected Outcomes Short Term goal: Utilizing psychosocial counselor, staff and physician to assist with identification of specific Stressors or current issues interfering with healing process. Setting desired goal for each stressor or current issue identified.;Long Term Goal: Stressors or current issues are controlled or eliminated.;Short Term goal: Identification and review with participant of any Quality of Life or Depression concerns found by scoring  the questionnaire.;Long Term goal: The participant improves quality of Life and PHQ9 Scores as seen by post scores and/or verbalization of changes          Quality of Life Scores:  Scores of 19 and below usually indicate a poorer quality of life in these areas.  A difference of  2-3 points is a clinically meaningful difference.  A difference of 2-3 points in the total score of the Quality of Life Index has been associated with significant improvement in overall quality of life, self-image, physical symptoms, and general health in studies assessing change in quality of life.  PHQ-9: Review Flowsheet       10/25/2023 10/09/2023  Depression screen PHQ 2/9  Decreased Interest 0 0  Down, Depressed, Hopeless 0 2  PHQ - 2 Score 0 2  Altered sleeping 1 1  Tired, decreased energy 1 3  Change in appetite 0 0  Feeling bad or failure about yourself  0 0  Trouble concentrating 1 1  Moving slowly or fidgety/restless 0 0  Suicidal thoughts 0 0  PHQ-9 Score 3 7  Difficult doing work/chores Somewhat difficult Somewhat difficult   Interpretation of Total Score  Total Score Depression Severity:  1-4 = Minimal depression, 5-9 = Mild depression, 10-14 = Moderate depression, 15-19 = Moderately severe depression, 20-27 = Severe depression   Psychosocial Evaluation and Intervention:  Psychosocial Evaluation - 10/03/23 1427       Psychosocial Evaluation & Interventions   Interventions Encouraged to exercise with the program and follow exercise prescription;Relaxation education;Stress management education    Comments Tanya Rhodes lives with her son and takes care of her if he needs to. She states she has anxiety at times and does not know where it stems from.    Expected Outcomes Short: Start [facility] to help with mood. Long: Maintain a healthy mental state    Continue Psychosocial Services  Follow up required by staff          Psychosocial Re-Evaluation:  Psychosocial Re-Evaluation     Row Name  10/25/23 1432             Psychosocial Re-Evaluation   Current issues with Current Stress Concerns;Current Anxiety/Panic       Comments Reviewed patient health questionnaire (PHQ-9) with patient for follow up. Previously, patients score indicated signs/symptoms of depression.  Reviewed to see if patient is improving symptom wise while in program.  Score improved and patient states that it is because she has been able to exercise more.       Expected Outcomes Short: Continue to attend [facility] regularly for regular exercise and social engagement. Long: Continue to improve symptoms and manage a positive mental state.       Interventions Encouraged to attend Pulmonary Rehabilitation for the exercise       Continue Psychosocial Services  Follow up required by staff          Psychosocial Discharge (Final Psychosocial Re-Evaluation):  Psychosocial Re-Evaluation - 10/25/23 1432       Psychosocial Re-Evaluation   Current issues with Current Stress Concerns;Current Anxiety/Panic    Comments Reviewed patient health questionnaire (  PHQ-9) with patient for follow up. Previously, patients score indicated signs/symptoms of depression.  Reviewed to see if patient is improving symptom wise while in program.  Score improved and patient states that it is because she has been able to exercise more.    Expected Outcomes Short: Continue to attend [facility] regularly for regular exercise and social engagement. Long: Continue to improve symptoms and manage a positive mental state.    Interventions Encouraged to attend Pulmonary Rehabilitation for the exercise    Continue Psychosocial Services  Follow up required by staff          Education: Education Goals: Education classes will be provided on a weekly basis, covering required topics. Participant will state understanding/return demonstration of topics presented.  Learning Barriers/Preferences:  Learning Barriers/Preferences - 10/03/23 1424        Learning Barriers/Preferences   Learning Barriers Hearing    Learning Preferences None          General Pulmonary Education Topics:  Infection Prevention: - Provides verbal and written material to individual with discussion of infection control including proper hand washing and proper equipment cleaning during exercise session. Flowsheet Row Pulmonary Rehab from 10/24/2023 in [facility] Cardiac and Pulmonary Rehab  Date 10/09/23  Educator [facility]  Instruction Review Code 1- Verbalizes Understanding    Falls Prevention: - Provides verbal and written material to individual with discussion of falls prevention and safety. Flowsheet Row Pulmonary Rehab from 10/24/2023 in [facility] Cardiac and Pulmonary Rehab  Date 10/09/23  Educator [facility]  Instruction Review Code 1- Verbalizes Understanding    Chronic Lung Disease Review: - Group verbal instruction with posters, models, PowerPoint presentations and videos,  to review new updates, new respiratory medications, new advancements in procedures and treatments. Providing information on websites and 800 numbers for continued self-education. Includes information about supplement oxygen , available portable oxygen  systems, continuous and intermittent flow rates, oxygen  safety, concentrators, and Medicare reimbursement for oxygen . Explanation of Pulmonary Drugs, including class, frequency, complications, importance of spacers, rinsing mouth after steroid MDI's, and proper cleaning methods for nebulizers. Review of basic lung anatomy and physiology related to function, structure, and complications of lung disease. Review of risk factors. Discussion about methods for diagnosing sleep apnea and types of masks and machines for OSA. Includes a review of the use of types of environmental controls: home humidity, furnaces, filters, dust mite/pet prevention, HEPA vacuums. Discussion about weather changes, air quality and the benefits of nasal washing. Instruction on Warning signs,  infection symptoms, calling MD promptly, preventive modes, and value of vaccinations. Review of effective airway clearance, coughing and/or vibration techniques. Emphasizing that all should Create an Action Plan. Written material provided at class time. Flowsheet Row Pulmonary Rehab from 10/24/2023 in [facility] Cardiac and Pulmonary Rehab  Education need identified 10/09/23    AED/CPR: - Group verbal and written instruction with the use of models to demonstrate the basic use of the AED with the basic ABC's of resuscitation.    Tests and Procedures:  - Group verbal and visual presentation and models provide information about basic cardiac anatomy and function. Reviews the testing methods done to diagnose heart disease and the outcomes of the test results. Describes the treatment choices: Medical Management, Angioplasty, or Coronary Bypass Surgery for treating various heart conditions including Myocardial Infarction, Angina, Valve Disease, and Cardiac Arrhythmias.  Written material provided at class time.   Medication Safety: - Group verbal and visual instruction to review commonly prescribed medications for heart and lung disease. Reviews the medication, class of  the drug, and side effects. Includes the steps to properly store meds and maintain the prescription regimen.  Written material given at graduation.   Other: -Provides group and verbal instruction on various topics (see comments)   Knowledge Questionnaire Score:  Knowledge Questionnaire Score - 10/09/23 1343       Knowledge Questionnaire Score   Pre Score 13/18           Core Components/Risk Factors/Patient Goals at Admission:  Personal Goals and Risk Factors at Admission - 10/03/23 1424       Core Components/Risk Factors/Patient Goals on Admission    Weight Management Yes;Weight Gain    Intervention Weight Management: Develop a combined nutrition and exercise program designed to reach desired caloric intake, while  maintaining appropriate intake of nutrient and fiber, sodium and fats, and appropriate energy expenditure required for the weight goal.;Weight Management: Provide education and appropriate resources to help participant work on and attain dietary goals.;Weight Management/Obesity: Establish reasonable short term and long term weight goals.    Expected Outcomes Short Term: Continue to assess and modify interventions until short term weight is achieved;Weight Maintenance: Understanding of the daily nutrition guidelines, which includes 25-35% calories from fat, 7% or less cal from saturated fats, less than 200mg  cholesterol, less than 1.5gm of sodium, & 5 or more servings of fruits and vegetables daily;Understanding recommendations for meals to include 15-35% energy as protein, 25-35% energy from fat, 35-60% energy from carbohydrates, less than 200mg  of dietary cholesterol, 20-35 gm of total fiber daily;Understanding of distribution of calorie intake throughout the day with the consumption of 4-5 meals/snacks;Weight Gain: Understanding of general recommendations for a high calorie, high protein meal plan that promotes weight gain by distributing calorie intake throughout the day with the consumption for 4-5 meals, snacks, and/or supplements    Improve shortness of breath with ADL's Yes    Intervention Provide education, individualized exercise plan and daily activity instruction to help decrease symptoms of SOB with activities of daily living.    Expected Outcomes Short Term: Improve cardiorespiratory fitness to achieve a reduction of symptoms when performing ADLs;Long Term: Be able to perform more ADLs without symptoms or delay the onset of symptoms    Hypertension Yes    Intervention Provide education on lifestyle modifcations including regular physical activity/exercise, weight management, moderate sodium restriction and increased consumption of fresh fruit, vegetables, and low fat dairy, alcohol moderation, and  smoking cessation.;Monitor prescription use compliance.    Expected Outcomes Short Term: Continued assessment and intervention until BP is < 140/12mm HG in hypertensive participants. < 130/31mm HG in hypertensive participants with diabetes, heart failure or chronic kidney disease.;Long Term: Maintenance of blood pressure at goal levels.    Lipids Yes    Intervention Provide education and support for participant on nutrition & aerobic/resistive exercise along with prescribed medications to achieve LDL 70mg , HDL >40mg .    Expected Outcomes Short Term: Participant states understanding of desired cholesterol values and is compliant with medications prescribed. Participant is following exercise prescription and nutrition guidelines.;Long Term: Cholesterol controlled with medications as prescribed, with individualized exercise RX and with personalized nutrition plan. Value goals: LDL < 70mg , HDL > 40 mg.          Education:Diabetes - Individual verbal and written instruction to review signs/symptoms of diabetes, desired ranges of glucose level fasting, after meals and with exercise. Acknowledge that pre and post exercise glucose checks will be done for 3 sessions at entry of program.   Know Your Numbers and Heart  Failure: - Group verbal and visual instruction to discuss disease risk factors for cardiac and pulmonary disease and treatment options.  Reviews associated critical values for Overweight/Obesity, Hypertension, Cholesterol, and Diabetes.  Discusses basics of heart failure: signs/symptoms and treatments.  Introduces Heart Failure Zone chart for action plan for heart failure. Written material provided at class time.   Core Components/Risk Factors/Patient Goals Review:   Goals and Risk Factor Review     Row Name 10/25/23 1425             Core Components/Risk Factors/Patient Goals Review   Personal Goals Review Improve shortness of breath with ADL's       Review Spoke to patient about  their shortness of breath and what they can do to improve. Patient has been informed of breathing techniques when starting the program. Patient is informed to tell staff if they have had any med changes and that certain meds they are taking or not taking can be causing shortness of breath.       Expected Outcomes Short: Attend [facility] regularly to improve shortness of breath with ADL's. Long: maintain independence with ADL's          Core Components/Risk Factors/Patient Goals at Discharge (Final Review):   Goals and Risk Factor Review - 10/25/23 1425       Core Components/Risk Factors/Patient Goals Review   Personal Goals Review Improve shortness of breath with ADL's    Review Spoke to patient about their shortness of breath and what they can do to improve. Patient has been informed of breathing techniques when starting the program. Patient is informed to tell staff if they have had any med changes and that certain meds they are taking or not taking can be causing shortness of breath.    Expected Outcomes Short: Attend [facility] regularly to improve shortness of breath with ADL's. Long: maintain independence with ADL's          ITP Comments:  ITP Comments     Row Name 10/03/23 1428 10/09/23 1512 10/15/23 1412 10/31/23 0945     ITP Comments Virtual Visit completed. Patient informed on EP and RD appointment and 6 Minute walk test. Patient also informed of patient health questionnaires on My Chart. Patient Verbalizes understanding. Visit diagnosis can be found in [facility] 09/13/2023. Completed and gym orientation for pulmonary rehab. Initial ITP created and sent for review to Dr. Fuad Aleskerov, Medical Director. First full day of exercise!  Patient was oriented to gym and equipment including functions, settings, policies, and procedures.  Patient's individual exercise prescription and treatment plan were reviewed.  All starting workloads were established based on the results of the 6 minute walk  test done at initial orientation visit.  The plan for exercise progression was also introduced and progression will be customized based on patient's performance and goals. 30 Day review completed. Medical Director ITP review done; changes made as directed and signed approval by Medical Director.       Comments: 30 day review

## 2023-11-01 ENCOUNTER — Encounter: Admitting: *Deleted

## 2023-11-01 DIAGNOSIS — J449 Chronic obstructive pulmonary disease, unspecified: Secondary | ICD-10-CM | POA: Diagnosis not present

## 2023-11-01 NOTE — Progress Notes (Signed)
 Daily Session Note  Patient Details  Name: Tanya Rhodes MRN: [id number] Date of Birth: Apr 18, 1940 Referring Provider:   Flowsheet Row Pulmonary Rehab from 10/09/2023 in [facility] Cardiac and Pulmonary Rehab  Referring Provider Dr. Lavelle Servant    Encounter Date: 11/01/2023  Check In:  Session Check In - 11/01/23 1347       Check-In   Supervising physician immediately available to respond to emergencies See telemetry face sheet for immediately available ER MD    Location [facility]-Cardiac & Pulmonary Rehab    Staff Present Hoy Rodney RN,BSN;Joseph Rolinda RCP,RRT,BSRT;Noah Tickle, [place], Exercise Physiologist;Maxon Conetta BS, Exercise Physiologist    Virtual Visit No    Medication changes reported     No    Fall or balance concerns reported    No    Warm-up and Cool-down Performed on first and last piece of equipment    Resistance Training Performed Yes    VAD Patient? No    PAD/SET Patient? No      Pain Assessment   Currently in Pain? No/denies             Social History   Tobacco Use  Smoking Status Former   Current packs/day: 0.00   Average packs/day: 1.5 packs/day for 52.5 years (78.8 ttl pk-yrs)   Types: Cigarettes   Start date: 05/10/1941   Quit date: 11/09/1993   Years since quitting: 29.9  Smokeless Tobacco Never    Goals Met:  Independence with exercise equipment Exercise tolerated well No report of concerns or symptoms today Strength training completed today  Goals Unmet:  Not Applicable  Comments: Pt able to follow exercise prescription today without complaint.  Will continue to monitor for progression.    Dr. Oneil Pinal is Medical Director for [facility] Cardiac Rehabilitation.  Dr. Fuad Aleskerov is Medical Director for [facility] Pulmonary Rehabilitation.

## 2023-11-05 ENCOUNTER — Encounter: Admitting: Emergency Medicine

## 2023-11-05 DIAGNOSIS — J449 Chronic obstructive pulmonary disease, unspecified: Secondary | ICD-10-CM | POA: Diagnosis not present

## 2023-11-05 NOTE — Progress Notes (Signed)
 Daily Session Note  Patient Details  Name: Tanya Rhodes MRN: [id number] Date of Birth: 07-Mar-1940 Referring Provider:   Flowsheet Row Pulmonary Rehab from 10/09/2023 in [facility] Cardiac and Pulmonary Rehab  Referring Provider Dr. Lavelle Servant    Encounter Date: 11/05/2023  Check In:  Session Check In - 11/05/23 1350       Check-In   Supervising physician immediately available to respond to emergencies See telemetry face sheet for immediately available ER MD    Location [facility]-Cardiac & Pulmonary Rehab    Staff Present Devaughn Jaeger, BS, Exercise Physiologist;Margaret Best, MS, Exercise Physiologist;Nicolus Ose RN,BSN;Maxon Conetta BS, Exercise Physiologist    Virtual Visit No    Medication changes reported     No    Tobacco Cessation No Change    Warm-up and Cool-down Performed on first and last piece of equipment    Resistance Training Performed Yes    VAD Patient? No    PAD/SET Patient? No      Pain Assessment   Currently in Pain? No/denies             Social History   Tobacco Use  Smoking Status Former   Current packs/day: 0.00   Average packs/day: 1.5 packs/day for 52.5 years (78.8 ttl pk-yrs)   Types: Cigarettes   Start date: 05/10/1941   Quit date: 11/09/1993   Years since quitting: 30.0  Smokeless Tobacco Never    Goals Met:  Proper associated with RPD/PD & O2 Sat Independence with exercise equipment Using PLB without cueing & demonstrates good technique Exercise tolerated well No report of concerns or symptoms today Strength training completed today  Goals Unmet:  Not Applicable  Comments: Pt able to follow exercise prescription today without complaint.  Will continue to monitor for progression.    Dr. Oneil Pinal is Medical Director for [facility] Cardiac Rehabilitation.  Dr. Fuad Aleskerov is Medical Director for [facility] Pulmonary Rehabilitation.

## 2023-11-07 ENCOUNTER — Encounter

## 2023-11-07 DIAGNOSIS — J449 Chronic obstructive pulmonary disease, unspecified: Secondary | ICD-10-CM

## 2023-11-07 NOTE — Progress Notes (Signed)
 Daily Session Note  Patient Details  Name: LISBET BUSKER MRN: [id number] Date of Birth: 08/21/1940 Referring Provider:   Flowsheet Row Pulmonary Rehab from 10/09/2023 in [facility] Cardiac and Pulmonary Rehab  Referring Provider Dr. Lavelle Servant    Encounter Date: 11/07/2023  Check In:  Session Check In - 11/07/23 1418       Check-In   Supervising physician immediately available to respond to emergencies See telemetry face sheet for immediately available ER MD    Location [facility]-Cardiac & Pulmonary Rehab    Staff Present Burnard Davenport RN,BSN,MPA;Laura Cates RN,BSN;Remus Hagedorn Dyane BS, ACSM CEP, Exercise Physiologist;Noah Tickle, BS, Exercise Physiologist    Virtual Visit No    Medication changes reported     No    Fall or balance concerns reported    No    Tobacco Cessation No Change    Warm-up and Cool-down Performed on first and last piece of equipment    Resistance Training Performed Yes    VAD Patient? No    PAD/SET Patient? No      Pain Assessment   Currently in Pain? No/denies             Social History   Tobacco Use  Smoking Status Former   Current packs/day: 0.00   Average packs/day: 1.5 packs/day for 52.5 years (78.8 ttl pk-yrs)   Types: Cigarettes   Start date: 05/10/1941   Quit date: 11/09/1993   Years since quitting: 30.0  Smokeless Tobacco Never    Goals Met:  Proper associated with RPD/PD & O2 Sat Independence with exercise equipment Using PLB without cueing & demonstrates good technique Exercise tolerated well No report of concerns or symptoms today Strength training completed today  Goals Unmet:  Not Applicable  Comments: Pt able to follow exercise prescription today without complaint.  Will continue to monitor for progression.    Dr. Oneil Pinal is Medical Director for [facility] Cardiac Rehabilitation.  Dr. Fuad Aleskerov is Medical Director for [facility] Pulmonary Rehabilitation.

## 2023-11-08 ENCOUNTER — Encounter: Admitting: *Deleted

## 2023-11-08 DIAGNOSIS — J449 Chronic obstructive pulmonary disease, unspecified: Secondary | ICD-10-CM | POA: Diagnosis not present

## 2023-11-08 NOTE — Progress Notes (Signed)
 Daily Session Note  Patient Details  Name: Tanya Rhodes MRN: [id number] Date of Birth: Mar 02, 1940 Referring Provider:   Flowsheet Row Pulmonary Rehab from 10/09/2023 in [facility] Cardiac and Pulmonary Rehab  Referring Provider Dr. Lavelle Servant    Encounter Date: 11/08/2023  Check In:  Session Check In - 11/08/23 1348       Check-In   Supervising physician immediately available to respond to emergencies See telemetry face sheet for immediately available ER MD    Location [facility]-Cardiac & Pulmonary Rehab    Staff Present Hoy Rodney RN,BSN;Joseph Rolinda NORWOOD HARMAN Cecilie Delores, [place], RRT, CPFT;Maxon Conetta BS, Exercise Physiologist    Virtual Visit No    Medication changes reported     No    Fall or balance concerns reported    No    Warm-up and Cool-down Performed on first and last piece of equipment    Resistance Training Performed Yes    VAD Patient? No    PAD/SET Patient? No      Pain Assessment   Currently in Pain? No/denies             Social History   Tobacco Use  Smoking Status Former   Current packs/day: 0.00   Average packs/day: 1.5 packs/day for 52.5 years (78.8 ttl pk-yrs)   Types: Cigarettes   Start date: 05/10/1941   Quit date: 11/09/1993   Years since quitting: 30.0  Smokeless Tobacco Never    Goals Met:  Independence with exercise equipment Exercise tolerated well No report of concerns or symptoms today Strength training completed today  Goals Unmet:  Not Applicable  Comments: Pt able to follow exercise prescription today without complaint.  Will continue to monitor for progression.    Dr. Oneil Pinal is Medical Director for [facility] Cardiac Rehabilitation.  Dr. Fuad Aleskerov is Medical Director for [facility] Pulmonary Rehabilitation.

## 2023-11-12 ENCOUNTER — Encounter: Attending: Specialist | Admitting: Emergency Medicine

## 2023-11-12 DIAGNOSIS — J449 Chronic obstructive pulmonary disease, unspecified: Secondary | ICD-10-CM | POA: Diagnosis present

## 2023-11-12 NOTE — Progress Notes (Signed)
 Daily Session Note  Patient Details  Name: Tanya Rhodes MRN: [id number] Date of Birth: 16-Sep-1940 Referring Provider:   Flowsheet Row Pulmonary Rehab from 10/09/2023 in [facility] Cardiac and Pulmonary Rehab  Referring Provider Dr. Lavelle Servant    Encounter Date: 11/12/2023  Check In:  Session Check In - 11/12/23 1400       Check-In   Supervising physician immediately available to respond to emergencies See telemetry face sheet for immediately available ER MD    Location [facility]-Cardiac & Pulmonary Rehab    Staff Present Leita Franks RN,BSN;Maxon Burnell BS, Exercise Physiologist;Margaret Best, MS, Exercise Physiologist;Noah Tickle, BS, Exercise Physiologist    Virtual Visit No    Medication changes reported     No    Fall or balance concerns reported    No    Tobacco Cessation No Change    Warm-up and Cool-down Performed on first and last piece of equipment    Resistance Training Performed Yes    VAD Patient? No    PAD/SET Patient? No      Pain Assessment   Currently in Pain? No/denies             Social History   Tobacco Use  Smoking Status Former   Current packs/day: 0.00   Average packs/day: 1.5 packs/day for 52.5 years (78.8 ttl pk-yrs)   Types: Cigarettes   Start date: 05/10/1941   Quit date: 11/09/1993   Years since quitting: 30.0  Smokeless Tobacco Never    Goals Met:  Proper associated with RPD/PD & O2 Sat Independence with exercise equipment Using PLB without cueing & demonstrates good technique Exercise tolerated well No report of concerns or symptoms today Strength training completed today  Goals Unmet:  Not Applicable  Comments: Pt able to follow exercise prescription today without complaint.  Will continue to monitor for progression.    Dr. Oneil Pinal is Medical Director for [facility] Cardiac Rehabilitation.  Dr. Fuad Aleskerov is Medical Director for [facility] Pulmonary Rehabilitation.

## 2023-11-14 ENCOUNTER — Encounter

## 2023-11-15 ENCOUNTER — Other Ambulatory Visit: Payer: Self-pay | Admitting: Internal Medicine

## 2023-11-15 ENCOUNTER — Encounter: Admitting: Emergency Medicine

## 2023-11-15 DIAGNOSIS — J449 Chronic obstructive pulmonary disease, unspecified: Secondary | ICD-10-CM

## 2023-11-15 DIAGNOSIS — Z1231 Encounter for screening mammogram for malignant neoplasm of breast: Secondary | ICD-10-CM

## 2023-11-15 NOTE — Progress Notes (Signed)
 Daily Session Note  Patient Details  Name: Tanya Rhodes MRN: [id number] Date of Birth: [birth year] Referring Provider:   Flowsheet Row Pulmonary Rehab from 10/09/2023 in [facility] Cardiac and Pulmonary Rehab  Referring Provider Dr. Lavelle Servant    Encounter Date: 11/15/2023  Check In:  Session Check In - 11/15/23 1347       Check-In   Supervising physician immediately available to respond to emergencies See telemetry face sheet for immediately available ER MD    Location [facility]-Cardiac & Pulmonary Rehab    Staff Present Fairy Plater RCP,RRT,BSRT;Sheridan Hew RN,BSN;Noah Tickle, BS, Exercise Physiologist;Margaret Best, MS, Exercise Physiologist    Virtual Visit No    Medication changes reported     No    Fall or balance concerns reported    No    Tobacco Cessation No Change    Warm-up and Cool-down Performed on first and last piece of equipment    Resistance Training Performed Yes    VAD Patient? No    PAD/SET Patient? No      Pain Assessment   Currently in Pain? No/denies             Social History   Tobacco Use  Smoking Status Former   Current packs/day: 0.00   Average packs/day: 1.5 packs/day for 52.5 years (78.8 ttl pk-yrs)   Types: Cigarettes   Start date: 05/10/1941   Quit date: 11/09/1993   Years since quitting: 30.0  Smokeless Tobacco Never    Goals Met:  Independence with exercise equipment Exercise tolerated well No report of concerns or symptoms today Strength training completed today  Goals Unmet:  Not Applicable  Comments: Pt able to follow exercise prescription today without complaint.  Will continue to monitor for progression.    Dr. Oneil Pinal is Medical Director for [facility] Cardiac Rehabilitation.  Dr. Fuad Aleskerov is Medical Director for [facility] Pulmonary Rehabilitation.

## 2023-11-19 ENCOUNTER — Encounter: Admitting: Emergency Medicine

## 2023-11-19 DIAGNOSIS — J449 Chronic obstructive pulmonary disease, unspecified: Secondary | ICD-10-CM

## 2023-11-19 NOTE — Progress Notes (Signed)
 Daily Session Note  Patient Details  Name: Tanya Rhodes MRN: [id number] Date of Birth: May 04, 1940 Referring Provider:   Flowsheet Row Pulmonary Rehab from 10/09/2023 in [facility] Cardiac and Pulmonary Rehab  Referring Provider Dr. Lavelle Servant    Encounter Date: 11/19/2023  Check In:  Session Check In - 11/19/23 1345       Check-In   Supervising physician immediately available to respond to emergencies See telemetry face sheet for immediately available ER MD    Location [facility]-Cardiac & Pulmonary Rehab    Staff Present Leita Franks RN,BSN;Maxon Conetta BS, Exercise Physiologist;Noah Tickle, BS, Exercise Physiologist;Margaret Best, MS, Exercise Physiologist    Virtual Visit No    Medication changes reported     No    Fall or balance concerns reported    No    Tobacco Cessation No Change    Warm-up and Cool-down Performed on first and last piece of equipment    Resistance Training Performed Yes    VAD Patient? No    PAD/SET Patient? No      Pain Assessment   Currently in Pain? No/denies             Social History   Tobacco Use  Smoking Status Former   Current packs/day: 0.00   Average packs/day: 1.5 packs/day for 52.5 years (78.8 ttl pk-yrs)   Types: Cigarettes   Start date: 05/10/1941   Quit date: 11/09/1993   Years since quitting: 30.0  Smokeless Tobacco Never    Goals Met:  Proper associated with RPD/PD & O2 Sat Independence with exercise equipment Using PLB without cueing & demonstrates good technique Exercise tolerated well No report of concerns or symptoms today Strength training completed today  Goals Unmet:  Not Applicable  Comments: Pt able to follow exercise prescription today without complaint.  Will continue to monitor for progression.    Dr. Oneil Pinal is Medical Director for [facility] Cardiac Rehabilitation.  Dr. Fuad Aleskerov is Medical Director for [facility] Pulmonary Rehabilitation.

## 2023-11-21 ENCOUNTER — Encounter

## 2023-11-21 DIAGNOSIS — J449 Chronic obstructive pulmonary disease, unspecified: Secondary | ICD-10-CM | POA: Diagnosis not present

## 2023-11-21 NOTE — Progress Notes (Signed)
 Daily Session Note  Patient Details  Name: Tanya Rhodes MRN: [id number] Date of Birth: Mar 08, 1940 Referring Provider:   Flowsheet Row Pulmonary Rehab from 10/09/2023 in [facility] Cardiac and Pulmonary Rehab  Referring Provider Dr. Lavelle Servant    Encounter Date: 11/21/2023  Check In:  Session Check In - 11/21/23 1353       Check-In   Supervising physician immediately available to respond to emergencies See telemetry face sheet for immediately available ER MD    Location [facility]-Cardiac & Pulmonary Rehab    Staff Present Burnard Davenport RN,BSN,MPA;Laura Cates RN,BSN;Jaxon Mynhier Dyane BS, ACSM CEP, Exercise Physiologist;Noah Tickle, BS, Exercise Physiologist    Virtual Visit No    Medication changes reported     No    Fall or balance concerns reported    No    Tobacco Cessation No Change    Warm-up and Cool-down Performed on first and last piece of equipment    Resistance Training Performed Yes    VAD Patient? No    PAD/SET Patient? No      Pain Assessment   Currently in Pain? No/denies             Social History   Tobacco Use  Smoking Status Former   Current packs/day: 0.00   Average packs/day: 1.5 packs/day for 52.5 years (78.8 ttl pk-yrs)   Types: Cigarettes   Start date: 05/10/1941   Quit date: 11/09/1993   Years since quitting: 30.0  Smokeless Tobacco Never    Goals Met:  Proper associated with RPD/PD & O2 Sat Independence with exercise equipment Using PLB without cueing & demonstrates good technique Exercise tolerated well No report of concerns or symptoms today Strength training completed today  Goals Unmet:  Not Applicable  Comments: Pt able to follow exercise prescription today without complaint.  Will continue to monitor for progression.    Dr. Oneil Pinal is Medical Director for [facility] Cardiac Rehabilitation.  Dr. Fuad Aleskerov is Medical Director for [facility] Pulmonary Rehabilitation.

## 2023-11-22 ENCOUNTER — Encounter: Admitting: *Deleted

## 2023-11-22 DIAGNOSIS — J449 Chronic obstructive pulmonary disease, unspecified: Secondary | ICD-10-CM

## 2023-11-22 NOTE — Progress Notes (Signed)
 Daily Session Note  Patient Details  Name: Tanya Rhodes MRN: [id number] Date of Birth: [birth year] Referring Provider:   Flowsheet Row Pulmonary Rehab from 10/09/2023 in [facility] Cardiac and Pulmonary Rehab  Referring Provider Dr. Lavelle Servant    Encounter Date: 11/22/2023  Check In:  Session Check In - 11/22/23 1359       Check-In   Supervising physician immediately available to respond to emergencies See telemetry face sheet for immediately available ER MD    Location [facility]-Cardiac & Pulmonary Rehab    Staff Present Rollene Paterson, MS, Exercise Physiologist;Dynisha Due Tressa RN,BSN;Joseph Rolinda NORWOOD HARMAN Verlie Laird, [place], Exercise Physiologist    Virtual Visit No    Medication changes reported     No    Fall or balance concerns reported    No    Warm-up and Cool-down Performed on first and last piece of equipment    Resistance Training Performed Yes    VAD Patient? No    PAD/SET Patient? No      Pain Assessment   Currently in Pain? No/denies             Social History   Tobacco Use  Smoking Status Former   Current packs/day: 0.00   Average packs/day: 1.5 packs/day for 52.5 years (78.8 ttl pk-yrs)   Types: Cigarettes   Start date: 05/10/1941   Quit date: 11/09/1993   Years since quitting: 30.0  Smokeless Tobacco Never    Goals Met:  Independence with exercise equipment Exercise tolerated well No report of concerns or symptoms today Strength training completed today  Goals Unmet:  Not Applicable  Comments: Pt able to follow exercise prescription today without complaint.  Will continue to monitor for progression.    Dr. Oneil Pinal is Medical Director for [facility] Cardiac Rehabilitation.  Dr. Fuad Aleskerov is Medical Director for [facility] Pulmonary Rehabilitation.

## 2023-11-26 ENCOUNTER — Encounter

## 2023-11-28 ENCOUNTER — Encounter: Admitting: Emergency Medicine

## 2023-11-28 DIAGNOSIS — J449 Chronic obstructive pulmonary disease, unspecified: Secondary | ICD-10-CM

## 2023-11-28 NOTE — Progress Notes (Signed)
 Pulmonary Individual Treatment Plan  Patient Details  Name: BRYER GOTTSCH MRN: [id number] Date of Birth: 05-29-1940 Referring Provider:   Flowsheet Row Pulmonary Rehab from 10/09/2023 in [facility] Cardiac and Pulmonary Rehab  Referring Provider Dr. Lavelle Servant    Initial Encounter Date:  Flowsheet Row Pulmonary Rehab from 10/09/2023 in [facility] Cardiac and Pulmonary Rehab  Date 10/09/23    Visit Diagnosis: COPD, moderate (HCC)  Patient's Home Medications on Admission:  Current Outpatient Medications:    acetaminophen  (TYLENOL ) 500 MG tablet, Take 500 mg by mouth every 6 (six) hours as needed for mild pain, moderate pain, fever or headache. , Disp: , Rfl:    albuterol  (PROVENTIL  HFA;VENTOLIN  HFA) 108 (90 Base) MCG/ACT inhaler, Inhale 2 puffs into the lungs every 4 (four) hours as needed for wheezing or shortness of breath., Disp: , Rfl:    albuterol  (PROVENTIL ) (2.5 MG/3ML) 0.083% nebulizer solution, Take 2.5 mg by nebulization every 6 (six) hours as needed for wheezing or shortness of breath., Disp: , Rfl:    ALPRAZolam  (XANAX ) 0.5 MG tablet, Take 0.5 mg by mouth daily as needed. (Patient not taking: Reported on 10/03/2023), Disp: , Rfl:    ALPRAZolam  (XANAX ) 0.5 MG tablet, Take by mouth. (Patient not taking: Reported on 10/03/2023), Disp: , Rfl:    ALPRAZolam  (XANAX ) 0.5 MG tablet, Take 0.5 mg by mouth., Disp: , Rfl:    amLODipine (NORVASC) 2.5 MG tablet, Take 2.5 mg by mouth., Disp: , Rfl:    budesonide -formoterol  (SYMBICORT ) 160-4.5 MCG/ACT inhaler, Inhale 2 puffs into the lungs 2 (two) times daily., Disp: , Rfl:    cyanocobalamin  1000 MCG tablet, Take 1,000 mcg by mouth daily., Disp: , Rfl:    escitalopram  (LEXAPRO ) 10 MG tablet, Take 10 mg by mouth at bedtime. (Patient not taking: Reported on 10/03/2023), Disp: , Rfl: 0   fluticasone  (FLONASE ) 50 MCG/ACT nasal spray, Place 2 sprays into both nostrils at bedtime., Disp: , Rfl:    furosemide  (LASIX ) 20 MG tablet, Take 20 mg by mouth at  bedtime. (Patient not taking: Reported on 10/03/2023), Disp: , Rfl:    gabapentin  (NEURONTIN ) 300 MG capsule, Take 300 mg by mouth at bedtime., Disp: , Rfl:    lisinopril  (PRINIVIL ,ZESTRIL ) 2.5 MG tablet, Take 2.5 mg by mouth at bedtime., Disp: , Rfl:    lisinopril  (ZESTRIL ) 2.5 MG tablet, Take 1 tablet by mouth daily. (Patient not taking: Reported on 10/03/2023), Disp: , Rfl:    loperamide (IMODIUM) 2 MG capsule, Take 2 mg by mouth as needed for diarrhea or loose stools., Disp: , Rfl:    metoprolol  succinate (TOPROL -XL) 25 MG 24 hr tablet, Take 25 mg by mouth daily. (Patient not taking: Reported on 10/03/2023), Disp: , Rfl:    OXYGEN , Inhale 2 L into the lungs continuous., Disp: , Rfl:    pantoprazole (PROTONIX) 40 MG tablet, Take 1 tablet by mouth at bedtime. (Patient not taking: Reported on 10/03/2023), Disp: , Rfl:    pantoprazole (PROTONIX) 40 MG tablet, Take 1 tablet by mouth daily., Disp: , Rfl:    simvastatin  (ZOCOR ) 20 MG tablet, Take 20 mg by mouth at bedtime. (Patient not taking: Reported on 10/03/2023), Disp: , Rfl:    SPIRIVA  RESPIMAT 2.5 MCG/ACT AERS, Inhale 2 puffs into the lungs every morning. (Patient not taking: Reported on 10/03/2023), Disp: , Rfl: 0   Tiotropium Bromide  Monohydrate (SPIRIVA  RESPIMAT) 2.5 MCG/ACT AERS, Inhale 5 mcg into the lungs., Disp: , Rfl:    traMADol  (ULTRAM ) 50 MG tablet, Take 50 mg  by mouth 2 (two) times daily as needed for moderate pain. (Patient not taking: Reported on 10/03/2023), Disp: , Rfl:    traMADol  (ULTRAM ) 50 MG tablet, Take 50 mg by mouth., Disp: , Rfl:    Vitamin D , Ergocalciferol , (DRISDOL ) 50000 units CAPS capsule, Take 50,000 Units by mouth every 7 (seven) days. 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   a.) CNB 07/21/2022 --> pathology (+) for ER/PR + ADH   Centrilobular emphysema (HCC)    Chronic systolic CHF (congestive heart failure) (HCC)    a.) TTE 03/31/2015: EF 20-25%, diffuse HK, mod LAE, sev LVE, mild RAE/RVE, mod TR; b.) MV 05/14/2015: EF 32%; c.) TTE 12/12/2016: EF 45%, no RWMAs, norm RVSF, mild MR/TR; d.) TTE 08/24/2022: EF 40%, sep HK, G1DD, norm RVSF, triv AR/TR, mild MR; e.) MV 08/24/2022: EF 49%   Colon polyposis    a.) s/p partial colectomy 2013   COPD (chronic obstructive pulmonary disease) (HCC)    Coronary artery disease involving native coronary artery of native heart without angina pectoris    a.) LHC 07/06/2008: 20% mLAD, 95% pRCA (3.5 x 18 mm Vision BMS); b.) MV 05/14/2015: no ischemia; c.) MV 08/24/2022: small reversible perfusion abnormality in the inferior region consistent with mild inferior ischemia.   Cortical senile cataract    DDD (degenerative disc disease), lumbar    Depression    Dilated cardiomyopathy (HCC)    a.) TTE 03/31/2015: EF 20-25%; b.) MV 05/14/2015: EF 32%; c.) TTE 12/12/2016: EF 45%; d.) TTE 08/24/2022: EF 40%; e.) MV 08/24/2022: EF 49%   Dyspnea    Former smoker    GERD (gastroesophageal reflux disease)    HLD (hyperlipidemia)    Hypertension    IDA (iron  deficiency anemia)    Internal hemorrhoids    LBBB (left bundle branch block)    Levoscoliosis of thoracic spine    Low serum vitamin D     Lumbar radiculitis    Lumbar spondylitis    Lumbar stenosis with neurogenic claudication    Melena    Osteoarthritis    PUD (peptic ulcer disease)    Pulmonary nodule    Restless leg    Status post left rotator cuff repair    Supplemental oxygen  dependent    a.) 2L/[place] continuous   Unintentional weight loss    Vitamin B12 deficiency     Tobacco Use: Social History   Tobacco Use  Smoking Status Former   Current packs/day: 0.00   Average packs/day: 1.5  packs/day for 52.5 years (78.8 ttl pk-yrs)   Types: Cigarettes   Start date: 05/10/1941   Quit date: 11/09/1993   Years since quitting: 30.0  Smokeless Tobacco Never    Labs: Review Flowsheet       Latest Ref Rng & Units 10/27/2013  Labs for ITP Cardiac and Pulmonary Rehab  Hemoglobin A1c 4.2 - 6.3 % 5.9      Pulmonary Assessment Scores:  Pulmonary Assessment Scores     Row Name 10/09/23 1341         CAT Score   CAT Score 20        UCSD: Self-administered rating  of dyspnea associated with activities of daily living (ADLs) 6-point scale (0 = not at all to 5 = maximal or unable to do because of breathlessness)  Scoring Scores range from 0 to 120.  Minimally important difference is 5 units  CAT: CAT can identify the health impairment of COPD patients and is better correlated with disease progression.  CAT has a scoring range of zero to 40. The CAT score is classified into four groups of low (less than 10), medium (10 - 20), high (21-30) and very high (31-40) based on the impact level of disease on health status. A CAT score over 10 suggests significant symptoms.  A worsening CAT score could be explained by an exacerbation, poor medication adherence, poor inhaler technique, or progression of COPD or comorbid conditions.  CAT MCID is 2 points  mMRC: mMRC (Modified Medical Research Council) Dyspnea Scale is used to assess the degree of baseline functional disability in patients of respiratory disease due to dyspnea. No minimal important difference is established. A decrease in score of 1 point or greater is considered a positive change.   Pulmonary Function Assessment:  Pulmonary Function Assessment - 10/03/23 1423       Pulmonary Function Tests   FEV1% 51 %    FEV1/FVC Ratio 45      Breath   Shortness of Breath Yes;Limiting activity          Exercise Target Goals: Exercise Program Goal: Individual exercise prescription set using results from initial 6 min walk  test and THRR while considering  patient's activity barriers and safety.   Exercise Prescription Goal: Initial exercise prescription builds to 30-45 minutes a day of aerobic activity, 2-3 days per week.  Home exercise guidelines will be given to patient during program as part of exercise prescription that the participant will acknowledge.  Education: Aerobic Exercise: - Group verbal and visual presentation on the components of exercise prescription. Introduces F.I.T.T principle from ACSM for exercise prescriptions.  Reviews F.I.T.T. principles of aerobic exercise including progression. Written material provided at class time. Flowsheet Row Pulmonary Rehab from 11/21/2023 in [facility] Cardiac and Pulmonary Rehab  Date 10/24/23  Educator mb  Instruction Review Code 1- Verbalizes Understanding    Education: Resistance Exercise: - Group verbal and visual presentation on the components of exercise prescription. Introduces F.I.T.T principle from ACSM for exercise prescriptions  Reviews F.I.T.T. principles of resistance exercise including progression. Written material provided at class time.    Education: Exercise & Equipment Safety: - Individual verbal instruction and demonstration of equipment use and safety with use of the equipment. Flowsheet Row Pulmonary Rehab from 11/21/2023 in [facility] Cardiac and Pulmonary Rehab  Date 10/09/23  Educator [facility]  Instruction Review Code 1- Verbalizes Understanding    Education: Exercise Physiology & General Exercise Guidelines: - Group verbal and written instruction with models to review the exercise physiology of the cardiovascular system and associated critical values. Provides general exercise guidelines with specific guidelines to those with heart or lung disease.    Education: Flexibility, Balance, Mind/Body Relaxation: - Group verbal and visual presentation with interactive activity on the components of exercise prescription. Introduces F.I.T.T principle from  ACSM for exercise prescriptions. Reviews F.I.T.T. principles of flexibility and balance exercise training including progression. Also discusses the mind body connection.  Reviews various relaxation techniques to help reduce and manage stress (i.e. Deep breathing, progressive muscle relaxation, and visualization). Balance handout provided to take home. Written material provided at class time. Flowsheet Row Pulmonary Rehab from 11/21/2023 in [facility]  Cardiac and Pulmonary Rehab  Date 10/17/23  Educator [facility]  Instruction Review Code 1- Verbalizes Understanding    Activity Barriers & Risk Stratification:  Activity Barriers & Cardiac Risk Stratification - 10/09/23 1516       Activity Barriers & Cardiac Risk Stratification   Activity Barriers Shortness of Breath;History of Falls          6 Minute Walk:  6 Minute Walk     Row Name 10/09/23 1513         6 Minute Walk   Phase Initial     Distance 770 feet     Walk Time 6 minutes     # of Rest Breaks 0     MPH 1.45     METS 1.61     RPE 13     Perceived Dyspnea  3     VO2 Peak 5.64     Symptoms No     Resting HR 91 bpm     Resting BP 146/62     Resting Oxygen  Saturation  93 %     Exercise Oxygen  Saturation  during 6 min walk 90 %     Max Ex. HR 102 bpm     Max Ex. BP 142/68     2 Minute Post BP 138/62       Interval HR   1 Minute HR 90     2 Minute HR 91     3 Minute HR 93     4 Minute HR 99     5 Minute HR 99     6 Minute HR 102     2 Minute Post HR 86     Interval Heart Rate? Yes       Interval Oxygen    Interval Oxygen ? Yes     Baseline Oxygen  Saturation % 93 %     1 Minute Oxygen  Saturation % 97 %     1 Minute Liters of Oxygen  2 L     2 Minute Oxygen  Saturation % 97 %     2 Minute Liters of Oxygen  2 L     3 Minute Oxygen  Saturation % 90 %     3 Minute Liters of Oxygen  2 L     4 Minute Oxygen  Saturation % 91 %     4 Minute Liters of Oxygen  2 L     5 Minute Oxygen  Saturation % 92 %     5 Minute Liters of Oxygen  2 L      6 Minute Oxygen  Saturation % 96 %     6 Minute Liters of Oxygen  2 L     2 Minute Post Oxygen  Saturation % 98 %     2 Minute Post Liters of Oxygen  2 L       Oxygen  Initial Assessment:  Oxygen  Initial Assessment - 10/03/23 1422       Home Oxygen    Home Oxygen  Device Home Concentrator;Portable Concentrator;E-Tanks    Sleep Oxygen  Prescription Continuous    Liters per minute 2    Home Exercise Oxygen  Prescription Continuous    Liters per minute 2    Home Resting Oxygen  Prescription Continuous    Liters per minute 2    Compliance with Home Oxygen  Use Yes      Initial 6 min Walk   Oxygen  Used Continuous    Liters per minute 2      Program Oxygen  Prescription   Program Oxygen  Prescription Continuous    Liters per minute  2      Intervention   Short Term Goals To learn and exhibit compliance with exercise, home and travel O2 prescription;To learn and understand importance of monitoring SPO2 with pulse oximeter and demonstrate accurate use of the pulse oximeter.;To learn and demonstrate proper pursed lip breathing techniques or other breathing techniques. ;To learn and understand importance of maintaining oxygen  saturations>88%;To learn and demonstrate proper use of respiratory medications    Long  Term Goals Exhibits compliance with exercise, home  and travel O2 prescription;Maintenance of O2 saturations>88%;Compliance with respiratory medication;Demonstrates proper use of MDI's;Exhibits proper breathing techniques, such as pursed lip breathing or other method taught during program session;Verbalizes importance of monitoring SPO2 with pulse oximeter and return demonstration          Oxygen  Re-Evaluation:  Oxygen  Re-Evaluation     Row Name 10/15/23 1413 10/25/23 1423 11/15/23 1406         Program Oxygen  Prescription   Program Oxygen  Prescription -- Continuous;E-Tanks Continuous;E-Tanks     Liters per minute -- 2 2       Home Oxygen    Home Oxygen  Device -- Home  Concentrator;Portable Concentrator;E-Tanks Home Concentrator;Portable Concentrator;E-Tanks     Sleep Oxygen  Prescription -- Continuous Continuous     Liters per minute -- 2 2     Home Exercise Oxygen  Prescription -- Continuous Continuous     Liters per minute -- 2 2     Home Resting Oxygen  Prescription -- Continuous Continuous     Liters per minute -- 2 2     Compliance with Home Oxygen  Use -- Yes Yes       Goals/Expected Outcomes   Short Term Goals -- To learn and demonstrate proper pursed lip breathing techniques or other breathing techniques.  To learn and exhibit compliance with exercise, home and travel O2 prescription     Long  Term Goals -- Exhibits proper breathing techniques, such as pursed lip breathing or other method taught during program session Exhibits compliance with exercise, home  and travel O2 prescription     Comments Reviewed PLB technique with pt.  Talked about how it works and it's importance in maintaining their exercise saturations. Informed patient how to perform the Pursed Lipped breathing technique. Told patient to Inhale through the nose and out the mouth with pursed lips to keep their airways open, help oxygenate them better, practice when at rest or doing strenuous activity. Patient Verbalizes understanding of technique and will work on and be reiterated during [facility]. Marvie wanted staff to look at her new portable pulse oximeter. The information on the websit showed 3 liters and informed patient how to work the timer on the machine. Her oxygen  on 2 liters keeps her oxygen  saturation up when she is doing exercise. Informed her that if her oxygen  stays above 88 percent with her portable concentraor on she can use it if she is comfortable.     Goals/Expected Outcomes Short: Become more profiecient at using PLB.   Long: Become independent at using PLB. Short: use PLB with exertion. Long: use PLB on exertion proficiently and independently. Short: check and use oxygen  as  needed. Long: maintain oxygen  saturation and machine independently.        Oxygen  Discharge (Final Oxygen  Re-Evaluation):  Oxygen  Re-Evaluation - 11/15/23 1406       Program Oxygen  Prescription   Program Oxygen  Prescription Continuous;E-Tanks    Liters per minute 2      Home Oxygen    Home Oxygen  Device Home Concentrator;Portable Concentrator;E-Tanks  Sleep Oxygen  Prescription Continuous    Liters per minute 2    Home Exercise Oxygen  Prescription Continuous    Liters per minute 2    Home Resting Oxygen  Prescription Continuous    Liters per minute 2    Compliance with Home Oxygen  Use Yes      Goals/Expected Outcomes   Short Term Goals To learn and exhibit compliance with exercise, home and travel O2 prescription    Long  Term Goals Exhibits compliance with exercise, home  and travel O2 prescription    Comments Izel wanted staff to look at her new portable pulse oximeter. The information on the websit showed 3 liters and informed patient how to work the timer on the machine. Her oxygen  on 2 liters keeps her oxygen  saturation up when she is doing exercise. Informed her that if her oxygen  stays above 88 percent with her portable concentraor on she can use it if she is comfortable.    Goals/Expected Outcomes Short: check and use oxygen  as needed. Long: maintain oxygen  saturation and machine independently.          Initial Exercise Prescription:  Initial Exercise Prescription - 10/09/23 1500       Date of Initial Exercise RX and Referring Provider   Date 10/09/23    Referring Provider Dr. Lavelle Servant      Oxygen    Oxygen  Continuous    Liters 2L    Maintain Oxygen  Saturation 88% or higher      Recumbant Bike   Level 2    RPM 50    Watts 25    Minutes 15    METs 1.61      NuStep   Level 2    SPM 80    Minutes 15    METs 1.61      REL-XR   Level 2    Watts 25    Speed 50    Minutes 15    METs 1.61      T5 Nustep   Level 2    SPM 80    Minutes 15     METs 1.61      Biostep-RELP   Level 2    SPM 50    Minutes 15    METs 1.61      Track   Laps 20    Minutes 15    METs 2.09      Prescription Details   Duration Progress to 30 minutes of continuous aerobic without signs/symptoms of physical distress      Intensity   THRR 40-80% of Max Heartrate 109-127    Ratings of Perceived Exertion 11-13    Perceived Dyspnea 0-4      Progression   Progression Continue to progress workloads to maintain intensity without signs/symptoms of physical distress.      Resistance Training   Training Prescription Yes    Weight 1lb    Reps 10-15          Perform Capillary Blood Glucose checks as needed.  Exercise Prescription Changes:   Exercise Prescription Changes     Row Name 10/09/23 1500 10/25/23 0800 11/05/23 1100 11/08/23 1400 11/22/23 1500     Response to Exercise   Blood Pressure (Admit) 146/62 132/62 132/78 -- 118/60   Blood Pressure (Exercise) 142/68 154/68 162/78 -- 152/68   Blood Pressure (Exit) 138/62 132/64 128/64 -- 110/60   Heart Rate (Admit) 91 bpm 86 bpm 83 bpm -- 92 bpm   Heart Rate (Exercise) 102  bpm 101 bpm 109 bpm -- 110 bpm   Heart Rate (Exit) 86 bpm 93 bpm 79 bpm -- 73 bpm   Oxygen  Saturation (Admit) 93 % 98 % 98 % -- 98 %   Oxygen  Saturation (Exercise) 90 % 95 % 93 % -- 93 %   Oxygen  Saturation (Exit) 98 % 98 % 98 % -- 97 %   Rating of Perceived Exertion (Exercise) 13 15 15  -- 15   Perceived Dyspnea (Exercise) 3 3 4  -- 3   Symptoms none none none -- none   Comments results first 2 weeks of exercise -- -- --   Duration -- Progress to 30 minutes of  aerobic without signs/symptoms of physical distress Progress to 30 minutes of  aerobic without signs/symptoms of physical distress -- Progress to 30 minutes of  aerobic without signs/symptoms of physical distress   Intensity -- THRR unchanged THRR unchanged -- THRR unchanged     Progression   Progression -- Continue to progress workloads to maintain intensity  without signs/symptoms of physical distress. Continue to progress workloads to maintain intensity without signs/symptoms of physical distress. -- Continue to progress workloads to maintain intensity without signs/symptoms of physical distress.   Average METs -- 2.1 2.33 -- 2.32     Resistance Training   Training Prescription -- Yes Yes -- Yes   Weight -- 1lb 1lb -- 2 lb   Reps -- 10-15 10-15 -- 10-15     Interval Training   Interval Training -- No No -- No     Oxygen    Oxygen  -- Continuous Continuous -- Continuous   Liters -- 2L 2 -- 2     Recumbant Bike   Level -- 1 2 -- 2   Watts -- 13 13 -- 19   Minutes -- 15 15 -- 15   METs -- 2.87 2.86 -- 2.99     NuStep   Level -- 1 2 -- 4   Minutes -- 15 15 -- 15   METs -- -- 3 -- 2.8     T5 Nustep   Level -- 2 1 -- 1   Minutes -- 15 15 -- 15   METs -- 2 1.9 -- 1.8     Biostep-RELP   Level -- 2 1 -- --   Minutes -- 15 15 -- --   METs -- 2 2 -- --     Track   Laps -- 8  Hall 26 -- 26   Minutes -- 15 15 -- 15   METs -- 1.44 2.41 -- 2.41     Home Exercise Plan   Plans to continue exercise at -- -- -- Home (comment)  Saskia currently is walking around her house as well as using hand weights on days she does not attend the program. Home (comment)  Cortlyn currently is walking around her house as well as using hand weights on days she does not attend the program.   Frequency -- -- -- Add 2 additional days to program exercise sessions. Add 2 additional days to program exercise sessions.   Initial Home Exercises Provided -- -- -- 11/08/23 11/08/23     Oxygen    Maintain Oxygen  Saturation -- 88% or higher 88% or higher -- 88% or higher      Exercise Comments:   Exercise Comments     Row Name 10/15/23 1412           Exercise Comments First full day of exercise!  Patient was oriented to  gym and equipment including functions, settings, policies, and procedures.  Patient's individual exercise prescription and treatment plan were  reviewed.  All starting workloads were established based on the results of the 6 minute walk test done at initial orientation visit.  The plan for exercise progression was also introduced and progression will be customized based on patient's performance and goals.          Exercise Goals and Review:   Exercise Goals     Row Name 10/09/23 1520             Exercise Goals   Increase Physical Activity Yes       Intervention Provide advice, education, support and counseling about physical activity/exercise needs.;Develop an individualized exercise prescription for aerobic and resistive training based on initial evaluation findings, risk stratification, comorbidities and participant's personal goals.       Expected Outcomes Short Term: Attend rehab on a regular basis to increase amount of physical activity.;Long Term: Add in home exercise to make exercise part of routine and to increase amount of physical activity.;Long Term: Exercising regularly at least 3-5 days a week.       Increase Strength and Stamina Yes       Intervention Provide advice, education, support and counseling about physical activity/exercise needs.;Develop an individualized exercise prescription for aerobic and resistive training based on initial evaluation findings, risk stratification, comorbidities and participant's personal goals.       Expected Outcomes Short Term: Increase workloads from initial exercise prescription for resistance, speed, and METs.;Short Term: Perform resistance training exercises routinely during rehab and add in resistance training at home;Long Term: Improve cardiorespiratory fitness, muscular endurance and strength as measured by increased METs and functional capacity ( )       Able to understand and use rate of perceived exertion (RPE) scale Yes       Intervention Provide education and explanation on how to use RPE scale       Expected Outcomes Short Term: Able to use RPE daily in rehab to express  subjective intensity level;Long Term:  Able to use RPE to guide intensity level when exercising independently       Able to understand and use Dyspnea scale Yes       Intervention Provide education and explanation on how to use Dyspnea scale       Expected Outcomes Short Term: Able to use Dyspnea scale daily in rehab to express subjective sense of shortness of breath during exertion;Long Term: Able to use Dyspnea scale to guide intensity level when exercising independently       Knowledge and understanding of Target Heart Rate Range (THRR) Yes       Intervention Provide education and explanation of THRR including how the numbers were predicted and where they are located for reference       Expected Outcomes Short Term: Able to state/look up THRR;Short Term: Able to use daily as guideline for intensity in rehab;Long Term: Able to use THRR to govern intensity when exercising independently       Able to check pulse independently Yes       Intervention Provide education and demonstration on how to check pulse in carotid and radial arteries.;Review the importance of being able to check your own pulse for safety during independent exercise       Expected Outcomes Long Term: Able to check pulse independently and accurately;Short Term: Able to explain why pulse checking is important during independent exercise  Understanding of Exercise Prescription Yes       Intervention Provide education, explanation, and written materials on patient's individual exercise prescription       Expected Outcomes Short Term: Able to explain program exercise prescription;Long Term: Able to explain home exercise prescription to exercise independently          Exercise Goals Re-Evaluation :  Exercise Goals Re-Evaluation     Row Name 10/15/23 1412 10/25/23 0841 11/05/23 1138 11/08/23 1433 11/22/23 1512     Exercise Goal Re-Evaluation   Exercise Goals Review Increase Physical Activity;Able to understand and use rate of  perceived exertion (RPE) scale;Knowledge and understanding of Target Heart Rate Range (THRR);Understanding of Exercise Prescription;Able to check pulse independently;Increase Strength and Stamina;Able to understand and use Dyspnea scale Increase Physical Activity;Increase Strength and Stamina;Understanding of Exercise Prescription Increase Physical Activity;Increase Strength and Stamina;Understanding of Exercise Prescription Increase Physical Activity;Able to understand and use Dyspnea scale;Understanding of Exercise Prescription;Knowledge and understanding of Target Heart Rate Range (THRR);Increase Strength and Stamina;Able to understand and use rate of perceived exertion (RPE) scale;Able to check pulse independently Increase Physical Activity;Understanding of Exercise Prescription;Increase Strength and Stamina   Comments Reviewed RPE and dyspnea scale, THR and program prescription with pt today.  Pt voiced understanding and was given a copy of goals to take home. Breanda is off to a good start in the program, and was able to attend her first few sessions during this review period. During these sessions she was able to use the T5 nustep, and biostep at level 2, as well as walk 8 laps along the hallway. We will continue to monitor her progress in the program. Mahreen is doing well in rehab. She was recently able to increase from 8 to 26 track laps in 20 minutes. She was also able to increase to level 2 on both the T4 nustep and recumbent bike. We will continue to monitor her progress in the program. Reviewed home exercise with pt today from 2:00 to 2:15.  Pt plans to continue to use hand weight exercises as well as walking around her house for exercise.  Reviewed THR, pulse, RPE, sign and symptoms, pulse oximetery and when to call 911 or MD.  Also discussed weather considerations and indoor options.  Pt voiced understanding. Jacinta continues to do well in rehab. She increased to level 4 on the T4 nustep and  increased to 2lb handweights. She maintained level 2 on the recumbent bike, level 1 on the T5 nustep, and 26 laps on the track. We will continue to monitor her progress in the program.   Expected Outcomes Short: Use RPE daily to regulate intensity.  Long: Follow program prescription in THR. Short: Continue to follow exercise prescription. Long: Continue exercise to improve strength and stamina. Short: Continue to follow exercise prescription, increase workloads when able. Long: Continue exercise to improve strength and stamina. Short: Continue to exercise at home as well as in the program. Long: Continue exercise plan to improve strength and stamina. Short: Try level 3 on the recumbent bike. Long: Continue exercise to improve strength and stamina.      Discharge Exercise Prescription (Final Exercise Prescription Changes):  Exercise Prescription Changes - 11/22/23 1500       Response to Exercise   Blood Pressure (Admit) 118/60    Blood Pressure (Exercise) 152/68    Blood Pressure (Exit) 110/60    Heart Rate (Admit) 92 bpm    Heart Rate (Exercise) 110 bpm    Heart Rate (Exit) 73 bpm  Oxygen  Saturation (Admit) 98 %    Oxygen  Saturation (Exercise) 93 %    Oxygen  Saturation (Exit) 97 %    Rating of Perceived Exertion (Exercise) 15    Perceived Dyspnea (Exercise) 3    Symptoms none    Duration Progress to 30 minutes of  aerobic without signs/symptoms of physical distress    Intensity THRR unchanged      Progression   Progression Continue to progress workloads to maintain intensity without signs/symptoms of physical distress.    Average METs 2.32      Resistance Training   Training Prescription Yes    Weight 2 lb    Reps 10-15      Interval Training   Interval Training No      Oxygen    Oxygen  Continuous    Liters 2      Recumbant Bike   Level 2    Watts 19    Minutes 15    METs 2.99      NuStep   Level 4    Minutes 15    METs 2.8      T5 Nustep   Level 1    Minutes 15     METs 1.8      Track   Laps 26    Minutes 15    METs 2.41      Home Exercise Plan   Plans to continue exercise at Home (comment)   Moya currently is walking around her house as well as using hand weights on days she does not attend the program.   Frequency Add 2 additional days to program exercise sessions.    Initial Home Exercises Provided 11/08/23      Oxygen    Maintain Oxygen  Saturation 88% or higher          Nutrition:  Target Goals: Understanding of nutrition guidelines, daily intake of sodium 1500mg , cholesterol 200mg , calories 30% from fat and 7% or less from saturated fats, daily to have 5 or more servings of fruits and vegetables.  Education: Nutrition 1 -Group instruction provided by verbal, written material, interactive activities, discussions, models, and posters to present general guidelines for heart healthy nutrition including macronutrients, label reading, and promoting whole foods over processed counterparts. Education serves as pensions consultant of discussion of heart healthy eating for all. Written material provided at class time. Flowsheet Row Pulmonary Rehab from 11/21/2023 in [facility] Cardiac and Pulmonary Rehab  Date 10/31/23  Educator jg  Instruction Review Code 1- Verbalizes Understanding      Education: Nutrition 2 -Group instruction provided by verbal, written material, interactive activities, discussions, models, and posters to present general guidelines for heart healthy nutrition including sodium, cholesterol, and saturated fat. Providing guidance of habit forming to improve blood pressure, cholesterol, and body weight. Written material provided at class time. Flowsheet Row Pulmonary Rehab from 11/21/2023 in [facility] Cardiac and Pulmonary Rehab  Date 11/07/23  Educator jg  Instruction Review Code 1- Verbalizes Understanding      Biometrics:  Pre Biometrics - 10/09/23 1520       Pre Biometrics   Height 5' 0.6 (1.539 m)    Weight 102 lb 11.2 oz  (46.6 kg)    Waist Circumference 29 inches    Hip Circumference 35 inches    Waist to Hip Ratio 0.83 %    BMI (Calculated) 19.67    Single Leg Stand 0 seconds           Nutrition Therapy Plan and Nutrition Goals:   Nutrition  Assessments:  MEDIFICTS Score Key: >=70 Need to make dietary changes  40-70 Heart Healthy Diet <= 40 Therapeutic Level Cholesterol Diet  Flowsheet Row Pulmonary Rehab from 10/09/2023 in [facility] Cardiac and Pulmonary Rehab  Picture Your Plate Total Score on Admission 54   Picture Your Plate Scores: <59 Unhealthy dietary pattern with much room for improvement. 41-50 Dietary pattern unlikely to meet recommendations for good health and room for improvement. 51-60 More healthful dietary pattern, with some room for improvement.  >60 Healthy dietary pattern, although there may be some specific behaviors that could be improved.   Nutrition Goals Re-Evaluation:  Nutrition Goals Re-Evaluation     Row Name 10/25/23 1425 11/15/23 1412           Goals   Comment Patient was informed on why it is important to maintain a balanced diet when dealing with Respiratory issues. Explained that it takes a lot of energy to breath and when they are short of breath often they will need to have a good diet to help keep up with the calories they are expending for breathing. Patient would like to meet the RD.      Expected Outcome Short: Choose and plan snacks accordingly to patients caloric intake to improve breathing. Long: Maintain a diet independently that meets their caloric intake to aid in daily shortness of breath. Short: meet with RD. Long: adhere to a diet that pertains to her.         Nutrition Goals Discharge (Final Nutrition Goals Re-Evaluation):  Nutrition Goals Re-Evaluation - 11/15/23 1412       Goals   Comment Patient would like to meet the RD.    Expected Outcome Short: meet with RD. Long: adhere to a diet that pertains to her.           Psychosocial: Target Goals: Acknowledge presence or absence of significant depression and/or stress, maximize coping skills, provide positive support system. Participant is able to verbalize types and ability to use techniques and skills needed for reducing stress and depression.   Education: Stress, Anxiety, and Depression - Group verbal and visual presentation to define topics covered.  Reviews how body is impacted by stress, anxiety, and depression.  Also discusses healthy ways to reduce stress and to treat/manage anxiety and depression.  Written material provided at class time.   Education: Sleep Hygiene -Provides group verbal and written instruction about how sleep can affect your health.  Define sleep hygiene, discuss sleep cycles and impact of sleep habits. Review good sleep hygiene tips.    Initial Review & Psychosocial Screening:  Initial Psych Review & Screening - 10/03/23 1426       Initial Review   Current issues with Current Psychotropic Meds;Current Anxiety/Panic      Family Dynamics   Good Support System? Yes    Comments Cynara lives with her son and takes care of her if he needs to. She states she has anxiety at times and does not know where it stems from.      Barriers   Psychosocial barriers to participate in program The patient should benefit from training in stress management and relaxation.;Psychosocial barriers identified (see note)      Screening Interventions   Interventions Provide feedback about the scores to participant;To provide support and resources with identified psychosocial needs;Encouraged to exercise    Expected Outcomes Short Term goal: Utilizing psychosocial counselor, staff and physician to assist with identification of specific Stressors or current issues interfering with healing process. Setting desired  goal for each stressor or current issue identified.;Long Term Goal: Stressors or current issues are controlled or eliminated.;Short Term  goal: Identification and review with participant of any Quality of Life or Depression concerns found by scoring the questionnaire.;Long Term goal: The participant improves quality of Life and PHQ9 Scores as seen by post scores and/or verbalization of changes          Quality of Life Scores:  Scores of 19 and below usually indicate a poorer quality of life in these areas.  A difference of  2-3 points is a clinically meaningful difference.  A difference of 2-3 points in the total score of the Quality of Life Index has been associated with significant improvement in overall quality of life, self-image, physical symptoms, and general health in studies assessing change in quality of life.  PHQ-9: Review Flowsheet       10/25/2023 10/09/2023  Depression screen PHQ 2/9  Decreased Interest 0 0  Down, Depressed, Hopeless 0 2  PHQ - 2 Score 0 2  Altered sleeping 1 1  Tired, decreased energy 1 3  Change in appetite 0 0  Feeling bad or failure about yourself  0 0  Trouble concentrating 1 1  Moving slowly or fidgety/restless 0 0  Suicidal thoughts 0 0  PHQ-9 Score 3  7   Difficult doing work/chores Somewhat difficult Somewhat difficult    Details       Data saved with a previous flowsheet row definition        Interpretation of Total Score  Total Score Depression Severity:  1-4 = Minimal depression, 5-9 = Mild depression, 10-14 = Moderate depression, 15-19 = Moderately severe depression, 20-27 = Severe depression   Psychosocial Evaluation and Intervention:  Psychosocial Evaluation - 10/03/23 1427       Psychosocial Evaluation & Interventions   Interventions Encouraged to exercise with the program and follow exercise prescription;Relaxation education;Stress management education    Comments Marly lives with her son and takes care of her if he needs to. She states she has anxiety at times and does not know where it stems from.    Expected Outcomes Short: Start [facility] to help with  mood. Long: Maintain a healthy mental state    Continue Psychosocial Services  Follow up required by staff          Psychosocial Re-Evaluation:  Psychosocial Re-Evaluation     Row Name 10/25/23 1432 11/15/23 1424           Psychosocial Re-Evaluation   Current issues with Current Stress Concerns;Current Anxiety/Panic Current Anxiety/Panic      Comments Reviewed patient health questionnaire (PHQ-9) with patient for follow up. Previously, patients score indicated signs/symptoms of depression.  Reviewed to see if patient is improving symptom wise while in program.  Score improved and patient states that it is because she has been able to exercise more. Patient states that she has some anxiety. She takes medication for her anxiety. She states that her son has alot of health issues and that he lives with her.      Expected Outcomes Short: Continue to attend [facility] regularly for regular exercise and social engagement. Long: Continue to improve symptoms and manage a positive mental state. Short: attened [facility] to keep stress at a minimum. Long: take medication as prescribed to help mood.      Interventions Encouraged to attend Pulmonary Rehabilitation for the exercise Encouraged to attend Pulmonary Rehabilitation for the exercise      Continue Psychosocial Services  Follow up required by staff Follow up required by staff         Psychosocial Discharge (Final Psychosocial Re-Evaluation):  Psychosocial Re-Evaluation - 11/15/23 1424       Psychosocial Re-Evaluation   Current issues with Current Anxiety/Panic    Comments Patient states that she has some anxiety. She takes medication for her anxiety. She states that her son has alot of health issues and that he lives with her.    Expected Outcomes Short: attened [facility] to keep stress at a minimum. Long: take medication as prescribed to help mood.    Interventions Encouraged to attend Pulmonary Rehabilitation for the exercise    Continue  Psychosocial Services  Follow up required by staff          Education: Education Goals: Education classes will be provided on a weekly basis, covering required topics. Participant will state understanding/return demonstration of topics presented.  Learning Barriers/Preferences:  Learning Barriers/Preferences - 10/03/23 1424       Learning Barriers/Preferences   Learning Barriers Hearing    Learning Preferences None          General Pulmonary Education Topics:  Infection Prevention: - Provides verbal and written material to individual with discussion of infection control including proper hand washing and proper equipment cleaning during exercise session. Flowsheet Row Pulmonary Rehab from 11/21/2023 in [facility] Cardiac and Pulmonary Rehab  Date 10/09/23  Educator [facility]  Instruction Review Code 1- Verbalizes Understanding    Falls Prevention: - Provides verbal and written material to individual with discussion of falls prevention and safety. Flowsheet Row Pulmonary Rehab from 11/21/2023 in [facility] Cardiac and Pulmonary Rehab  Date 10/09/23  Educator [facility]  Instruction Review Code 1- Verbalizes Understanding    Chronic Lung Disease Review: - Group verbal instruction with posters, models, PowerPoint presentations and videos,  to review new updates, new respiratory medications, new advancements in procedures and treatments. Providing information on websites and 800 numbers for continued self-education. Includes information about supplement oxygen , available portable oxygen  systems, continuous and intermittent flow rates, oxygen  safety, concentrators, and Medicare reimbursement for oxygen . Explanation of Pulmonary Drugs, including class, frequency, complications, importance of spacers, rinsing mouth after steroid MDI's, and proper cleaning methods for nebulizers. Review of basic lung anatomy and physiology related to function, structure, and complications of lung disease. Review of risk  factors. Discussion about methods for diagnosing sleep apnea and types of masks and machines for OSA. Includes a review of the use of types of environmental controls: home humidity, furnaces, filters, dust mite/pet prevention, HEPA vacuums. Discussion about weather changes, air quality and the benefits of nasal washing. Instruction on Warning signs, infection symptoms, calling MD promptly, preventive modes, and value of vaccinations. Review of effective airway clearance, coughing and/or vibration techniques. Emphasizing that all should Create an Action Plan. Written material provided at class time. Flowsheet Row Pulmonary Rehab from 11/21/2023 in [facility] Cardiac and Pulmonary Rehab  Education need identified 10/09/23    AED/CPR: - Group verbal and written instruction with the use of models to demonstrate the basic use of the AED with the basic ABC's of resuscitation.    Tests and Procedures:  - Group verbal and visual presentation and models provide information about basic cardiac anatomy and function. Reviews the testing methods done to diagnose heart disease and the outcomes of the test results. Describes the treatment choices: Medical Management, Angioplasty, or Coronary Bypass Surgery for treating various heart conditions including Myocardial Infarction, Angina, Valve Disease, and Cardiac Arrhythmias.  Written material provided  at class time.   Medication Safety: - Group verbal and visual instruction to review commonly prescribed medications for heart and lung disease. Reviews the medication, class of the drug, and side effects. Includes the steps to properly store meds and maintain the prescription regimen.  Written material given at graduation. Flowsheet Row Pulmonary Rehab from 11/21/2023 in [facility] Cardiac and Pulmonary Rehab  Date 11/21/23  Educator lc  Instruction Review Code 1- Verbalizes Understanding    Other: -Provides group and verbal instruction on various topics (see  comments)   Knowledge Questionnaire Score:  Knowledge Questionnaire Score - 10/09/23 1343       Knowledge Questionnaire Score   Pre Score 13/18           Core Components/Risk Factors/Patient Goals at Admission:  Personal Goals and Risk Factors at Admission - 10/03/23 1424       Core Components/Risk Factors/Patient Goals on Admission    Weight Management Yes;Weight Gain    Intervention Weight Management: Develop a combined nutrition and exercise program designed to reach desired caloric intake, while maintaining appropriate intake of nutrient and fiber, sodium and fats, and appropriate energy expenditure required for the weight goal.;Weight Management: Provide education and appropriate resources to help participant work on and attain dietary goals.;Weight Management/Obesity: Establish reasonable short term and long term weight goals.    Expected Outcomes Short Term: Continue to assess and modify interventions until short term weight is achieved;Weight Maintenance: Understanding of the daily nutrition guidelines, which includes 25-35% calories from fat, 7% or less cal from saturated fats, less than 200mg  cholesterol, less than 1.5gm of sodium, & 5 or more servings of fruits and vegetables daily;Understanding recommendations for meals to include 15-35% energy as protein, 25-35% energy from fat, 35-60% energy from carbohydrates, less than 200mg  of dietary cholesterol, 20-35 gm of total fiber daily;Understanding of distribution of calorie intake throughout the day with the consumption of 4-5 meals/snacks;Weight Gain: Understanding of general recommendations for a high calorie, high protein meal plan that promotes weight gain by distributing calorie intake throughout the day with the consumption for 4-5 meals, snacks, and/or supplements    Improve shortness of breath with ADL's Yes    Intervention Provide education, individualized exercise plan and daily activity instruction to help decrease  symptoms of SOB with activities of daily living.    Expected Outcomes Short Term: Improve cardiorespiratory fitness to achieve a reduction of symptoms when performing ADLs;Long Term: Be able to perform more ADLs without symptoms or delay the onset of symptoms    Hypertension Yes    Intervention Provide education on lifestyle modifcations including regular physical activity/exercise, weight management, moderate sodium restriction and increased consumption of fresh fruit, vegetables, and low fat dairy, alcohol moderation, and smoking cessation.;Monitor prescription use compliance.    Expected Outcomes Short Term: Continued assessment and intervention until BP is < 140/76mm HG in hypertensive participants. < 130/64mm HG in hypertensive participants with diabetes, heart failure or chronic kidney disease.;Long Term: Maintenance of blood pressure at goal levels.    Lipids Yes    Intervention Provide education and support for participant on nutrition & aerobic/resistive exercise along with prescribed medications to achieve LDL 70mg , HDL >40mg .    Expected Outcomes Short Term: Participant states understanding of desired cholesterol values and is compliant with medications prescribed. Participant is following exercise prescription and nutrition guidelines.;Long Term: Cholesterol controlled with medications as prescribed, with individualized exercise RX and with personalized nutrition plan. Value goals: LDL < 70mg , HDL > 40 mg.  Education:Diabetes - Individual verbal and written instruction to review signs/symptoms of diabetes, desired ranges of glucose level fasting, after meals and with exercise. Acknowledge that pre and post exercise glucose checks will be done for 3 sessions at entry of program.   Know Your Numbers and Heart Failure: - Group verbal and visual instruction to discuss disease risk factors for cardiac and pulmonary disease and treatment options.  Reviews associated critical values  for Overweight/Obesity, Hypertension, Cholesterol, and Diabetes.  Discusses basics of heart failure: signs/symptoms and treatments.  Introduces Heart Failure Zone chart for action plan for heart failure. Written material provided at class time.   Core Components/Risk Factors/Patient Goals Review:   Goals and Risk Factor Review     Row Name 10/25/23 1425 11/15/23 1419           Core Components/Risk Factors/Patient Goals Review   Personal Goals Review Improve shortness of breath with ADL's Hypertension      Review Spoke to patient about their shortness of breath and what they can do to improve. Patient has been informed of breathing techniques when starting the program. Patient is informed to tell staff if they have had any med changes and that certain meds they are taking or not taking can be causing shortness of breath. Check patients wrist blood pressure cuff agains our manual pressure. Her cuff was off by 30 points systolic. She states that she will not use that one and try a different option.      Expected Outcomes Short: Attend [facility] regularly to improve shortness of breath with ADL's. Long: maintain independence with ADL's Short: monitor blood pressure at home. Long: check blood pressure at home independently.         Core Components/Risk Factors/Patient Goals at Discharge (Final Review):   Goals and Risk Factor Review - 11/15/23 1419       Core Components/Risk Factors/Patient Goals Review   Personal Goals Review Hypertension    Review Check patients wrist blood pressure cuff agains our manual pressure. Her cuff was off by 30 points systolic. She states that she will not use that one and try a different option.    Expected Outcomes Short: monitor blood pressure at home. Long: check blood pressure at home independently.          ITP Comments:  ITP Comments     Row Name 10/03/23 1428 10/09/23 1512 10/15/23 1412 10/31/23 0945 11/28/23 1255   ITP Comments Virtual Visit  completed. Patient informed on EP and RD appointment and 6 Minute walk test. Patient also informed of patient health questionnaires on My Chart. Patient Verbalizes understanding. Visit diagnosis can be found in [facility] 09/13/2023. Completed and gym orientation for pulmonary rehab. Initial ITP created and sent for review to Dr. Fuad Aleskerov, Medical Director. First full day of exercise!  Patient was oriented to gym and equipment including functions, settings, policies, and procedures.  Patient's individual exercise prescription and treatment plan were reviewed.  All starting workloads were established based on the results of the 6 minute walk test done at initial orientation visit.  The plan for exercise progression was also introduced and progression will be customized based on patient's performance and goals. 30 Day review completed. Medical Director ITP review done; changes made as directed and signed approval by Medical Director. 30 Day review completed. Medical Director ITP review done, changes made as directed, and signed approval by Medical Director.      Comments: 30 day ITP review

## 2023-11-28 NOTE — Progress Notes (Signed)
 Daily Session Note  Patient Details  Name: Tanya Rhodes MRN: [id number] Date of Birth: 1940/10/25 Referring Provider:   Flowsheet Row Pulmonary Rehab from 10/09/2023 in [facility] Cardiac and Pulmonary Rehab  Referring Provider Dr. Lavelle Servant    Encounter Date: 11/28/2023  Check In:  Session Check In - 11/28/23 1418       Check-In   Supervising physician immediately available to respond to emergencies See telemetry face sheet for immediately available ER MD    Location [facility]-Cardiac & Pulmonary Rehab    Staff Present Leita Franks RN,BSN;Joseph [facility] Dyane BS, ACSM CEP, Exercise Physiologist;Kelly [facility]    Virtual Visit No    Medication changes reported     No    Fall or balance concerns reported    No    Tobacco Cessation No Change    Warm-up and Cool-down Performed on first and last piece of equipment    Resistance Training Performed Yes    VAD Patient? No    PAD/SET Patient? No      Pain Assessment   Currently in Pain? No/denies             Social History   Tobacco Use  Smoking Status Former   Current packs/day: 0.00   Average packs/day: 1.5 packs/day for 52.5 years (78.8 ttl pk-yrs)   Types: Cigarettes   Start date: 05/10/1941   Quit date: 11/09/1993   Years since quitting: 30.0  Smokeless Tobacco Never    Goals Met:  Proper associated with RPD/PD & O2 Sat Independence with exercise equipment Using PLB without cueing & demonstrates good technique Exercise tolerated well No report of concerns or symptoms today Strength training completed today  Goals Unmet:  Not Applicable  Comments: Pt able to follow exercise prescription today without complaint.  Will continue to monitor for progression.    Dr. Oneil Pinal is Medical Director for [facility] Cardiac Rehabilitation.  Dr. Fuad Aleskerov is Medical Director for [facility] Pulmonary Rehabilitation.

## 2023-11-29 ENCOUNTER — Encounter: Admitting: Emergency Medicine

## 2023-11-29 DIAGNOSIS — J449 Chronic obstructive pulmonary disease, unspecified: Secondary | ICD-10-CM

## 2023-11-29 NOTE — Progress Notes (Signed)
 Daily Session Note  Patient Details  Name: Tanya Rhodes MRN: [id number] Date of Birth: December 15, 1940 Referring Provider:   Flowsheet Row Pulmonary Rehab from 10/09/2023 in [facility] Cardiac and Pulmonary Rehab  Referring Provider Dr. Lavelle Servant    Encounter Date: 11/29/2023  Check In:  Session Check In - 11/29/23 1345       Check-In   Supervising physician immediately available to respond to emergencies See telemetry face sheet for immediately available ER MD    Location [facility]-Cardiac & Pulmonary Rehab    Staff Present Leita Franks RN,BSN;Joseph [facility] BS, Exercise Physiologist;Noah Tickle, BS, Exercise Physiologist    Virtual Visit No    Medication changes reported     No    Fall or balance concerns reported    No    Tobacco Cessation No Change    Warm-up and Cool-down Performed on first and last piece of equipment    Resistance Training Performed Yes    VAD Patient? No    PAD/SET Patient? No      Pain Assessment   Currently in Pain? No/denies             Social History   Tobacco Use  Smoking Status Former   Current packs/day: 0.00   Average packs/day: 1.5 packs/day for 52.5 years (78.8 ttl pk-yrs)   Types: Cigarettes   Start date: 05/10/1941   Quit date: 11/09/1993   Years since quitting: 30.0  Smokeless Tobacco Never    Goals Met:  Proper associated with RPD/PD & O2 Sat Independence with exercise equipment Using PLB without cueing & demonstrates good technique Exercise tolerated well No report of concerns or symptoms today Strength training completed today  Goals Unmet:  Not Applicable  Comments: Pt able to follow exercise prescription today without complaint.  Will continue to monitor for progression.    Dr. Oneil Pinal is Medical Director for [facility] Cardiac Rehabilitation.  Dr. Fuad Aleskerov is Medical Director for [facility] Pulmonary Rehabilitation.

## 2023-12-03 ENCOUNTER — Encounter

## 2023-12-04 ENCOUNTER — Encounter

## 2023-12-04 DIAGNOSIS — J449 Chronic obstructive pulmonary disease, unspecified: Secondary | ICD-10-CM

## 2023-12-04 NOTE — Progress Notes (Signed)
 Daily Session Note  Patient Details  Name: LOUCILE POSNER MRN: [id number] Date of Birth: 1940/08/11 Referring Provider:   Flowsheet Row Pulmonary Rehab from 10/09/2023 in [facility] Cardiac and Pulmonary Rehab  Referring Provider Dr. Lavelle Servant    Encounter Date: 12/04/2023  Check In:  Session Check In - 12/04/23 1055       Check-In   Supervising physician immediately available to respond to emergencies See telemetry face sheet for immediately available ER MD    Location [facility]-Cardiac & Pulmonary Rehab    Staff Present Burnard Davenport RN,BSN,MPA;Maxon Conetta BS, Exercise Physiologist;Margaret Best, MS, Exercise Physiologist;Noah Tickle, BS, Exercise Physiologist;Jason Elnor RDN,LDN    Virtual Visit No    Medication changes reported     No    Fall or balance concerns reported    No    Tobacco Cessation No Change    Warm-up and Cool-down Performed on first and last piece of equipment    Resistance Training Performed Yes    VAD Patient? No    PAD/SET Patient? No      Pain Assessment   Currently in Pain? No/denies             Social History   Tobacco Use  Smoking Status Former   Current packs/day: 0.00   Average packs/day: 1.5 packs/day for 52.5 years (78.8 ttl pk-yrs)   Types: Cigarettes   Start date: 05/10/1941   Quit date: 11/09/1993   Years since quitting: 30.0  Smokeless Tobacco Never    Goals Met:  Proper associated with RPD/PD & O2 Sat Independence with exercise equipment Using PLB without cueing & demonstrates good technique Exercise tolerated well No report of concerns or symptoms today Strength training completed today  Goals Unmet:  Not Applicable  Comments: Pt able to follow exercise prescription today without complaint.  Will continue to monitor for progression.    Dr. Oneil Pinal is Medical Director for [facility] Cardiac Rehabilitation.  Dr. Fuad Aleskerov is Medical Director for [facility] Pulmonary Rehabilitation.

## 2023-12-05 ENCOUNTER — Encounter: Admitting: Emergency Medicine

## 2023-12-05 DIAGNOSIS — J449 Chronic obstructive pulmonary disease, unspecified: Secondary | ICD-10-CM

## 2023-12-05 NOTE — Progress Notes (Signed)
 Daily Session Note  Patient Details  Name: Tanya Rhodes MRN: [id number] Date of Birth: May 27, 1940 Referring Provider:   Flowsheet Row Pulmonary Rehab from 10/09/2023 in [facility] Cardiac and Pulmonary Rehab  Referring Provider Dr. Lavelle Servant    Encounter Date: 12/05/2023  Check In:  Session Check In - 12/05/23 1402       Check-In   Supervising physician immediately available to respond to emergencies See telemetry face sheet for immediately available ER MD    Location [facility]-Cardiac & Pulmonary Rehab    Staff Present Leita Franks RN,BSN;Noah Tickle, BS, Exercise Physiologist;Margaret Best, MS, Exercise Physiologist;Meredith Tressa RN,BSN    Virtual Visit No    Medication changes reported     No    Fall or balance concerns reported    No    Tobacco Cessation No Change    Warm-up and Cool-down Performed on first and last piece of equipment    Resistance Training Performed Yes    VAD Patient? No    PAD/SET Patient? No      Pain Assessment   Currently in Pain? No/denies             Social History   Tobacco Use  Smoking Status Former   Current packs/day: 0.00   Average packs/day: 1.5 packs/day for 52.5 years (78.8 ttl pk-yrs)   Types: Cigarettes   Start date: 05/10/1941   Quit date: 11/09/1993   Years since quitting: 30.0  Smokeless Tobacco Never    Goals Met:  Proper associated with RPD/PD & O2 Sat Independence with exercise equipment Using PLB without cueing & demonstrates good technique Exercise tolerated well No report of concerns or symptoms today Strength training completed today  Goals Unmet:  Not Applicable  Comments: Pt able to follow exercise prescription today without complaint.  Will continue to monitor for progression.    Dr. Oneil Pinal is Medical Director for [facility] Cardiac Rehabilitation.  Dr. Fuad Aleskerov is Medical Director for [facility] Pulmonary Rehabilitation.

## 2023-12-10 ENCOUNTER — Encounter: Attending: Specialist | Admitting: Emergency Medicine

## 2023-12-10 DIAGNOSIS — J449 Chronic obstructive pulmonary disease, unspecified: Secondary | ICD-10-CM | POA: Insufficient documentation

## 2023-12-10 NOTE — Progress Notes (Signed)
 Daily Session Note  Patient Details  Name: Tanya Rhodes MRN: [id number] Date of Birth: [birth year] Referring Provider:   Flowsheet Row Pulmonary Rehab from 10/09/2023 in [facility] Cardiac and Pulmonary Rehab  Referring Provider Dr. Lavelle Servant    Encounter Date: 12/10/2023  Check In:  Session Check In - 12/10/23 1417       Check-In   Supervising physician immediately available to respond to emergencies See telemetry face sheet for immediately available ER MD    Location [facility]-Cardiac & Pulmonary Rehab    Staff Present Maxon Conetta BS, Exercise Physiologist;Margaret Best, MS, Exercise Physiologist;Noah Tickle, BS, Exercise Physiologist;Lashonna Rieke RN,BSN    Virtual Visit No    Medication changes reported     No    Fall or balance concerns reported    No    Tobacco Cessation No Change    Warm-up and Cool-down Performed on first and last piece of equipment    Resistance Training Performed Yes    VAD Patient? No    PAD/SET Patient? No      Pain Assessment   Currently in Pain? No/denies             Social History   Tobacco Use  Smoking Status Former   Current packs/day: 0.00   Average packs/day: 1.5 packs/day for 52.5 years (78.8 ttl pk-yrs)   Types: Cigarettes   Start date: 05/10/1941   Quit date: 11/09/1993   Years since quitting: 30.1  Smokeless Tobacco Never    Goals Met:  Proper associated with RPD/PD & O2 Sat Independence with exercise equipment Using PLB without cueing & demonstrates good technique Exercise tolerated well No report of concerns or symptoms today Strength training completed today  Goals Unmet:  Not Applicable  Comments: Pt able to follow exercise prescription today without complaint.  Will continue to monitor for progression.    Dr. Oneil Pinal is Medical Director for [facility] Cardiac Rehabilitation.  Dr. Fuad Aleskerov is Medical Director for [facility] Pulmonary Rehabilitation.

## 2023-12-12 ENCOUNTER — Encounter

## 2023-12-13 ENCOUNTER — Encounter

## 2023-12-17 ENCOUNTER — Encounter

## 2023-12-19 ENCOUNTER — Encounter: Admitting: Emergency Medicine

## 2023-12-19 DIAGNOSIS — J449 Chronic obstructive pulmonary disease, unspecified: Secondary | ICD-10-CM

## 2023-12-19 NOTE — Progress Notes (Signed)
 Daily Session Note  Patient Details  Name: Tanya Rhodes MRN: [id number] Date of Birth: 12/14/1940 Referring Provider:   Flowsheet Row Pulmonary Rehab from 10/09/2023 in [facility] Cardiac and Pulmonary Rehab  Referring Provider Dr. Lavelle Servant    Encounter Date: 12/19/2023  Check In:  Session Check In - 12/19/23 1403       Check-In   Supervising physician immediately available to respond to emergencies See telemetry face sheet for immediately available ER MD    Location [facility]-Cardiac & Pulmonary Rehab    Staff Present Leita Franks RN,BSN;Noah Tickle, BS, Exercise Physiologist;Valdis Bevill Dyane HECKLE, ACSM CEP, Exercise Physiologist;Dametra Whetsel Bollinger [facility]    Virtual Visit No    Medication changes reported     No    Fall or balance concerns reported    No    Tobacco Cessation No Change    Warm-up and Cool-down Performed on first and last piece of equipment    Resistance Training Performed Yes    VAD Patient? No    PAD/SET Patient? No      Pain Assessment   Currently in Pain? No/denies             Social History   Tobacco Use  Smoking Status Former   Current packs/day: 0.00   Average packs/day: 1.5 packs/day for 52.5 years (78.8 ttl pk-yrs)   Types: Cigarettes   Start date: 05/10/1941   Quit date: 11/09/1993   Years since quitting: 30.1  Smokeless Tobacco Never    Goals Met:  Proper associated with RPD/PD & O2 Sat Independence with exercise equipment Using PLB without cueing & demonstrates good technique Exercise tolerated well Personal goals reviewed No report of concerns or symptoms today Strength training completed today  Goals Unmet:  Not Applicable  Comments: Pt able to follow exercise prescription today without complaint.  Will continue to monitor for progression.    Dr. Oneil Pinal is Medical Director for [facility] Cardiac Rehabilitation.  Dr. Fuad Aleskerov is Medical Director for [facility] Pulmonary Rehabilitation.

## 2023-12-19 NOTE — Progress Notes (Signed)
 Daily Session Note  Patient Details  Name: Tanya Rhodes MRN: [id number] Date of Birth: Dec 11, 1940 Referring Provider:   Flowsheet Row Pulmonary Rehab from 10/09/2023 in [facility] Cardiac and Pulmonary Rehab  Referring Provider Dr. Lavelle Servant    Encounter Date: 12/19/2023  Check In:  Session Check In - 12/19/23 1403       Check-In   Supervising physician immediately available to respond to emergencies See telemetry face sheet for immediately available ER MD    Location [facility]-Cardiac & Pulmonary Rehab    Staff Present Leita Franks RN,BSN;Noah Tickle, BS, Exercise Physiologist;Kelly Dyane HECKLE, ACSM CEP, Exercise Physiologist;Kelly Bollinger [facility]    Virtual Visit No    Medication changes reported     No    Fall or balance concerns reported    No    Tobacco Cessation No Change    Warm-up and Cool-down Performed on first and last piece of equipment    Resistance Training Performed Yes    VAD Patient? No    PAD/SET Patient? No      Pain Assessment   Currently in Pain? No/denies             Social History   Tobacco Use  Smoking Status Former   Current packs/day: 0.00   Average packs/day: 1.5 packs/day for 52.5 years (78.8 ttl pk-yrs)   Types: Cigarettes   Start date: 05/10/1941   Quit date: 11/09/1993   Years since quitting: 30.1  Smokeless Tobacco Never    Goals Met:  Proper associated with RPD/PD & O2 Sat Independence with exercise equipment Using PLB without cueing & demonstrates good technique Exercise tolerated well No report of concerns or symptoms today Strength training completed today  Goals Unmet:  Not Applicable  Comments: Pt able to follow exercise prescription today without complaint.  Will continue to monitor for progression.    Dr. Oneil Pinal is Medical Director for [facility] Cardiac Rehabilitation.  Dr. Fuad Aleskerov is Medical Director for [facility] Pulmonary Rehabilitation.

## 2023-12-20 ENCOUNTER — Encounter: Admitting: *Deleted

## 2023-12-20 DIAGNOSIS — J449 Chronic obstructive pulmonary disease, unspecified: Secondary | ICD-10-CM | POA: Diagnosis not present

## 2023-12-20 NOTE — Progress Notes (Signed)
 Daily Session Note  Patient Details  Name: Tanya Rhodes MRN: [id number] Date of Birth: 11-Jul-1940 Referring Provider:   Flowsheet Row Pulmonary Rehab from 10/09/2023 in [facility] Cardiac and Pulmonary Rehab  Referring Provider Dr. Lavelle Servant    Encounter Date: 12/20/2023  Check In:  Session Check In - 12/20/23 1346       Check-In   Supervising physician immediately available to respond to emergencies See telemetry face sheet for immediately available ER MD    Location [facility]-Cardiac & Pulmonary Rehab    Staff Present Hoy Rodney RN,BSN;Joseph [facility] BS, Exercise Physiologist;Noah Tickle, BS, Exercise Physiologist    Virtual Visit No    Medication changes reported     No    Fall or balance concerns reported    No    Warm-up and Cool-down Performed on first and last piece of equipment    Resistance Training Performed Yes    VAD Patient? No    PAD/SET Patient? No      Pain Assessment   Currently in Pain? No/denies             Tobacco Use History[1]  Goals Met:  Independence with exercise equipment Exercise tolerated well No report of concerns or symptoms today Strength training completed today  Goals Unmet:  Not Applicable  Comments: Pt able to follow exercise prescription today without complaint.  Will continue to monitor for progression.    Dr. Oneil Pinal is Medical Director for [facility] Cardiac Rehabilitation.  Dr. Fuad Aleskerov is Medical Director for [facility] Pulmonary Rehabilitation.    [1]  Social History Tobacco Use  Smoking Status Former   Current packs/day: 0.00   Average packs/day: 1.5 packs/day for 52.5 years (78.8 ttl pk-yrs)   Types: Cigarettes   Start date: 05/10/1941   Quit date: 11/09/1993   Years since quitting: 30.1  Smokeless Tobacco Never

## 2023-12-24 ENCOUNTER — Encounter

## 2023-12-26 ENCOUNTER — Encounter

## 2023-12-26 DIAGNOSIS — J449 Chronic obstructive pulmonary disease, unspecified: Secondary | ICD-10-CM

## 2023-12-26 NOTE — Progress Notes (Signed)
 Daily Session Note  Patient Details  Name: Tanya Rhodes MRN: [id number] Date of Birth: 1940-02-10 Referring Provider:   Flowsheet Row Pulmonary Rehab from 10/09/2023 in [facility] Cardiac and Pulmonary Rehab  Referring Provider Dr. Lavelle Servant    Encounter Date: 12/26/2023  Check In:  Session Check In - 12/26/23 1411       Check-In   Supervising physician immediately available to respond to emergencies See telemetry face sheet for immediately available ER MD    Location [facility]-Cardiac & Pulmonary Rehab    Staff Present Leita Franks RN,BSN;Kelly Bollinger RN,BSN,MPA;Noah Tickle, BS, Exercise Physiologist;Kelly Dyane HECKLE, ACSM CEP, Exercise Physiologist    Virtual Visit No    Medication changes reported     No    Fall or balance concerns reported    No    Tobacco Cessation No Change    Warm-up and Cool-down Performed on first and last piece of equipment    Resistance Training Performed Yes    VAD Patient? No    PAD/SET Patient? No      Pain Assessment   Currently in Pain? No/denies             Tobacco Use History[1]  Goals Met:  Proper associated with RPD/PD & O2 Sat Independence with exercise equipment Using PLB without cueing & demonstrates good technique Exercise tolerated well No report of concerns or symptoms today Strength training completed today  Goals Unmet:  Not Applicable  Comments: Pt able to follow exercise prescription today without complaint.  Will continue to monitor for progression.    Dr. Oneil Pinal is Medical Director for [facility] Cardiac Rehabilitation.  Dr. Fuad Aleskerov is Medical Director for [facility] Pulmonary Rehabilitation.    [1]  Social History Tobacco Use  Smoking Status Former   Current packs/day: 0.00   Average packs/day: 1.5 packs/day for 52.5 years (78.8 ttl pk-yrs)   Types: Cigarettes   Start date: 05/10/1941   Quit date: 11/09/1993   Years since quitting: 30.1  Smokeless Tobacco Never

## 2023-12-26 NOTE — Progress Notes (Signed)
 Pulmonary Individual Treatment Plan  Patient Details  Name: Tanya Rhodes MRN: [id number] Date of Birth: 12-02-1940 Referring Provider:   Flowsheet Row Pulmonary Rehab from 10/09/2023 in [facility] Cardiac and Pulmonary Rehab  Referring Provider Dr. 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   a.) CNB 07/21/2022 --> pathology (+) for ER/PR + ADH   Centrilobular emphysema (HCC)    Chronic systolic CHF (congestive heart failure) (HCC)    a.) TTE 03/31/2015: EF 20-25%, diffuse HK, mod LAE, sev LVE, mild RAE/RVE, mod TR; b.) MV 05/14/2015: EF 32%; c.) TTE 12/12/2016: EF 45%, no RWMAs, norm RVSF, mild MR/TR; d.) TTE 08/24/2022: EF 40%, sep HK, G1DD, norm RVSF, triv AR/TR, mild MR; e.) MV 08/24/2022: EF 49%   Colon polyposis    a.) s/p partial colectomy 2013   COPD (chronic obstructive pulmonary disease) (HCC)    Coronary artery disease involving native coronary artery of native heart without angina pectoris    a.) LHC 07/06/2008: 20% mLAD, 95% pRCA (3.5 x 18 mm Vision BMS); b.) MV 05/14/2015: no ischemia; c.) MV 08/24/2022: small reversible perfusion abnormality in the inferior region consistent with mild inferior ischemia.   Cortical senile cataract    DDD (degenerative disc disease), lumbar    Depression    Dilated cardiomyopathy (HCC)    a.) TTE 03/31/2015: EF 20-25%; b.) MV 05/14/2015: EF 32%; c.) TTE 12/12/2016: EF 45%; d.) TTE 08/24/2022: EF 40%; e.) MV  08/24/2022: EF 49%   Dyspnea    Former smoker    GERD (gastroesophageal reflux disease)    HLD (hyperlipidemia)    Hypertension    IDA (iron  deficiency anemia)    Internal hemorrhoids    LBBB (left bundle branch block)    Levoscoliosis of thoracic spine    Low serum vitamin D     Lumbar radiculitis    Lumbar spondylitis    Lumbar stenosis with neurogenic claudication    Melena    Osteoarthritis    PUD (peptic ulcer disease)    Pulmonary nodule    Restless leg    Status post left rotator cuff repair    Supplemental oxygen  dependent    a.) 2L/Struble continuous   Unintentional weight loss    Vitamin B12 deficiency     Tobacco Use: Tobacco Use History[2]  Labs: Review Flowsheet       Latest Ref Rng & Units 10/27/2013  Labs for ITP Cardiac and Pulmonary Rehab  Hemoglobin A1c 4.2 - 6.3 % 5.9      Pulmonary Assessment Scores:  Pulmonary Assessment Scores     Row Name 10/09/23 1341         CAT Score   CAT Score 20        UCSD: Self-administered rating of dyspnea associated with activities of daily living (ADLs) 6-point scale (0 = not at all to 5 = maximal or unable to do because of breathlessness)  Scoring Scores range from 0 to 120.  Minimally  important difference is 5 units  CAT: CAT can identify the health impairment of COPD patients and is better correlated with disease progression.  CAT has a scoring range of zero to 40. The CAT score is classified into four groups of low (less than 10), medium (10 - 20), high (21-30) and very high (31-40) based on the impact level of disease on health status. A CAT score over 10 suggests significant symptoms.  A worsening CAT score could be explained by an exacerbation, poor medication adherence, poor inhaler technique, or progression of COPD or comorbid conditions.  CAT MCID is 2 points  mMRC: mMRC (Modified Medical Research Council) Dyspnea Scale is used to assess the degree of baseline functional disability in patients of  respiratory disease due to dyspnea. No minimal important difference is established. A decrease in score of 1 point or greater is considered a positive change.   Pulmonary Function Assessment:  Pulmonary Function Assessment - 10/03/23 1423       Pulmonary Function Tests   FEV1% 51 %    FEV1/FVC Ratio 45      Breath   Shortness of Breath Yes;Limiting activity          Exercise Target Goals: Exercise Program Goal: Individual exercise prescription set using results from initial 6 min walk test and THRR while considering  patients activity barriers and safety.   Exercise Prescription Goal: Initial exercise prescription builds to 30-45 minutes a day of aerobic activity, 2-3 days per week.  Home exercise guidelines will be given to patient during program as part of exercise prescription that the participant will acknowledge.  Education: Aerobic Exercise: - Group verbal and visual presentation on the components of exercise prescription. Introduces F.I.T.T principle from ACSM for exercise prescriptions.  Reviews F.I.T.T. principles of aerobic exercise including progression. Written material provided at class time. Flowsheet Row Pulmonary Rehab from 12/19/2023 in [facility] Cardiac and Pulmonary Rehab  Date 10/24/23  Educator mb  Instruction Review Code 1- Verbalizes Understanding    Education: Resistance Exercise: - Group verbal and visual presentation on the components of exercise prescription. Introduces F.I.T.T principle from ACSM for exercise prescriptions  Reviews F.I.T.T. principles of resistance exercise including progression. Written material provided at class time.    Education: Exercise & Equipment Safety: - Individual verbal instruction and demonstration of equipment use and safety with use of the equipment. Flowsheet Row Pulmonary Rehab from 12/19/2023 in [facility] Cardiac and Pulmonary Rehab  Date 10/09/23  Educator [facility]  Instruction Review Code 1- Verbalizes Understanding     Education: Exercise Physiology & General Exercise Guidelines: - Group verbal and written instruction with models to review the exercise physiology of the cardiovascular system and associated critical values. Provides general exercise guidelines with specific guidelines to those with heart or lung disease.  Flowsheet Row Pulmonary Rehab from 12/19/2023 in [facility] Cardiac and Pulmonary Rehab  Date 12/19/23  Educator kh  Instruction Review Code 1- [employer] Understanding    Education: Flexibility, Balance, Mind/Body Relaxation: - Group verbal and visual presentation with interactive activity on the components of exercise prescription. Introduces F.I.T.T principle from ACSM for exercise prescriptions. Reviews F.I.T.T. principles of flexibility and balance exercise training including progression. Also discusses the mind body connection.  Reviews various relaxation techniques to help reduce and manage stress (i.e. Deep breathing, progressive muscle relaxation, and visualization). Balance handout provided to take home. Written material provided at class time. Flowsheet Row Pulmonary Rehab from 12/19/2023 in [facility] Cardiac and Pulmonary Rehab  Date 10/17/23  Educator [facility]  Instruction Review Code 1- Verbalizes Understanding    Activity Barriers & Risk Stratification:  Activity Barriers & Cardiac Risk Stratification - 10/09/23 1516       Activity Barriers & Cardiac Risk Stratification   Activity Barriers Shortness of Breath;History of Falls          6 Minute Walk:  6 Minute Walk     Row Name 10/09/23 1513         6 Minute Walk   Phase Initial     Distance 770 feet     Walk Time 6 minutes     # of Rest Breaks 0     MPH 1.45     METS 1.61     RPE 13     Perceived Dyspnea  3     VO2 Peak 5.64     Symptoms No     Resting HR 91 bpm     Resting BP 146/62     Resting Oxygen  Saturation  93 %     Exercise Oxygen  Saturation  during 6 min walk 90 %     Max Ex. HR 102 bpm     Max Ex. BP  142/68     2 Minute Post BP 138/62       Interval HR   1 Minute HR 90     2 Minute HR 91     3 Minute HR 93     4 Minute HR 99     5 Minute HR 99     6 Minute HR 102     2 Minute Post HR 86     Interval Heart Rate? Yes       Interval Oxygen    Interval Oxygen ? Yes     Baseline Oxygen  Saturation % 93 %     1 Minute Oxygen  Saturation % 97 %     1 Minute Liters of Oxygen  2 L     2 Minute Oxygen  Saturation % 97 %     2 Minute Liters of Oxygen  2 L     3 Minute Oxygen  Saturation % 90 %     3 Minute Liters of Oxygen  2 L     4 Minute Oxygen  Saturation % 91 %     4 Minute Liters of Oxygen  2 L     5 Minute Oxygen  Saturation % 92 %     5 Minute Liters of Oxygen  2 L     6 Minute Oxygen  Saturation % 96 %     6 Minute Liters of Oxygen  2 L     2 Minute Post Oxygen  Saturation % 98 %     2 Minute Post Liters of Oxygen  2 L       Oxygen  Initial Assessment:  Oxygen  Initial Assessment - 10/03/23 1422       Home Oxygen    Home Oxygen  Device Home Concentrator;Portable Concentrator;E-Tanks    Sleep Oxygen  Prescription Continuous    Liters per minute 2    Home Exercise Oxygen  Prescription Continuous    Liters per minute 2    Home Resting Oxygen  Prescription Continuous    Liters per minute 2    Compliance with Home Oxygen  Use Yes      Initial 6 min Walk   Oxygen  Used Continuous    Liters per minute 2      Program Oxygen  Prescription   Program Oxygen  Prescription Continuous    Liters per minute 2      Intervention   Short Term  Goals To learn and exhibit compliance with exercise, home and travel O2 prescription;To learn and understand importance of monitoring SPO2 with pulse oximeter and demonstrate accurate use of the pulse oximeter.;To learn and demonstrate proper pursed lip breathing techniques or other breathing techniques. ;To learn and understand importance of maintaining oxygen  saturations>88%;To learn and demonstrate proper use of respiratory medications    Long  Term Goals Exhibits  compliance with exercise, home  and travel O2 prescription;Maintenance of O2 saturations>88%;Compliance with respiratory medication;Demonstrates proper use of MDIs;Exhibits proper breathing techniques, such as pursed lip breathing or other method taught during program session;Verbalizes importance of monitoring SPO2 with pulse oximeter and return demonstration          Oxygen  Re-Evaluation:  Oxygen  Re-Evaluation     Row Name 10/15/23 1413 10/25/23 1423 11/15/23 1406 12/19/23 1550       Program Oxygen  Prescription   Program Oxygen  Prescription -- Continuous;E-Tanks Continuous;E-Tanks Continuous;E-Tanks    Liters per minute -- 2 2 2       Home Oxygen    Home Oxygen  Device -- Home Concentrator;Portable Concentrator;E-Tanks Home Concentrator;Portable Concentrator;E-Tanks Home Concentrator;Portable Concentrator;E-Tanks    Sleep Oxygen  Prescription -- Continuous Continuous Continuous    Liters per minute -- 2 2 2     Home Exercise Oxygen  Prescription -- Continuous Continuous Continuous    Liters per minute -- 2 2 2     Home Resting Oxygen  Prescription -- Continuous Continuous Continuous    Liters per minute -- 2 2 2     Compliance with Home Oxygen  Use -- Yes Yes --      Goals/Expected Outcomes   Short Term Goals -- To learn and demonstrate proper pursed lip breathing techniques or other breathing techniques.  To learn and exhibit compliance with exercise, home and travel O2 prescription To learn and understand importance of monitoring SPO2 with pulse oximeter and demonstrate accurate use of the pulse oximeter.;To learn and understand importance of maintaining oxygen  saturations>88%;To learn and demonstrate proper pursed lip breathing techniques or other breathing techniques. ;To learn and demonstrate proper use of respiratory medications    Long  Term Goals -- Exhibits proper breathing techniques, such as pursed lip breathing or other method taught during program session Exhibits compliance with  exercise, home  and travel O2 prescription Maintenance of O2 saturations>88%;Compliance with respiratory medication;Exhibits proper breathing techniques, such as pursed lip breathing or other method taught during program session;Verbalizes importance of monitoring SPO2 with pulse oximeter and return demonstration    Comments Reviewed PLB technique with pt.  Talked about how it works and it's importance in maintaining their exercise saturations. Informed patient how to perform the Pursed Lipped breathing technique. Told patient to Inhale through the nose and out the mouth with pursed lips to keep their airways open, help oxygenate them better, practice when at rest or doing strenuous activity. Patient Verbalizes understanding of technique and will work on and be reiterated during [facility]. Tanya Rhodes wanted staff to look at her new portable pulse oximeter. The information on the websit showed 3 liters and informed patient how to work the timer on the machine. Her oxygen  on 2 liters keeps her oxygen  saturation up when she is doing exercise. Informed her that if her oxygen  stays above 88 percent with her portable concentraor on she can use it if she is comfortable. Tanya Rhodes stated that she didn't think she has a pulse oximeter. She was encouraged to get on eif she does not have one and start monitoring Sa02 at home, maintaining it at 88% or above.  PLB techniques were reviewed and verbalized understanding of how to use PLB to control SOB. She reports that she takes all respiratory medications.    Goals/Expected Outcomes Short: Become more profiecient at using PLB.   Long: Become independent at using PLB. Short: use PLB with exertion. Long: use PLB on exertion proficiently and independently. Short: check and use oxygen  as needed. Long: maintain oxygen  saturation and machine independently. Short: buy a pulse ox and start monitoring at home. Long: maitain oxygen  saturations and become independent with PLB.       Oxygen   Discharge (Final Oxygen  Re-Evaluation):  Oxygen  Re-Evaluation - 12/19/23 1550       Program Oxygen  Prescription   Program Oxygen  Prescription Continuous;E-Tanks    Liters per minute 2      Home Oxygen    Home Oxygen  Device Home Concentrator;Portable Concentrator;E-Tanks    Sleep Oxygen  Prescription Continuous    Liters per minute 2    Home Exercise Oxygen  Prescription Continuous    Liters per minute 2    Home Resting Oxygen  Prescription Continuous    Liters per minute 2      Goals/Expected Outcomes   Short Term Goals To learn and understand importance of monitoring SPO2 with pulse oximeter and demonstrate accurate use of the pulse oximeter.;To learn and understand importance of maintaining oxygen  saturations>88%;To learn and demonstrate proper pursed lip breathing techniques or other breathing techniques. ;To learn and demonstrate proper use of respiratory medications    Long  Term Goals Maintenance of O2 saturations>88%;Compliance with respiratory medication;Exhibits proper breathing techniques, such as pursed lip breathing or other method taught during program session;Verbalizes importance of monitoring SPO2 with pulse oximeter and return demonstration    Comments Tanya Rhodes stated that she didn't think she has a pulse oximeter. She was encouraged to get on eif she does not have one and start monitoring Sa02 at home, maintaining it at 88% or above. PLB techniques were reviewed and verbalized understanding of how to use PLB to control SOB. She reports that she takes all respiratory medications.    Goals/Expected Outcomes Short: buy a pulse ox and start monitoring at home. Long: maitain oxygen  saturations and become independent with PLB.          Initial Exercise Prescription:  Initial Exercise Prescription - 10/09/23 1500       Date of Initial Exercise RX and Referring Provider   Date 10/09/23    Referring Provider Dr. Lavelle Servant      Oxygen    Oxygen  Continuous    Liters 2L     Maintain Oxygen  Saturation 88% or higher      Recumbant Bike   Level 2    RPM 50    Watts 25    Minutes 15    METs 1.61      NuStep   Level 2    SPM 80    Minutes 15    METs 1.61      REL-XR   Level 2    Watts 25    Speed 50    Minutes 15    METs 1.61      T5 Nustep   Level 2    SPM 80    Minutes 15    METs 1.61      Biostep-RELP   Level 2    SPM 50    Minutes 15    METs 1.61      Track   Laps 20    Minutes 15    METs 2.09  Prescription Details   Duration Progress to 30 minutes of continuous aerobic without signs/symptoms of physical distress      Intensity   THRR 40-80% of Max Heartrate 109-127    Ratings of Perceived Exertion 11-13    Perceived Dyspnea 0-4      Progression   Progression Continue to progress workloads to maintain intensity without signs/symptoms of physical distress.      Resistance Training   Training Prescription Yes    Weight 1lb    Reps 10-15          Perform Capillary Blood Glucose checks as needed.  Exercise Prescription Changes:   Exercise Prescription Changes     Row Name 10/09/23 1500 10/25/23 0800 11/05/23 1100 11/08/23 1400 11/22/23 1500     Response to Exercise   Blood Pressure (Admit) 146/62 132/62 132/78 -- 118/60   Blood Pressure (Exercise) 142/68 154/68 162/78 -- 152/68   Blood Pressure (Exit) 138/62 132/64 128/64 -- 110/60   Heart Rate (Admit) 91 bpm 86 bpm 83 bpm -- 92 bpm   Heart Rate (Exercise) 102 bpm 101 bpm 109 bpm -- 110 bpm   Heart Rate (Exit) 86 bpm 93 bpm 79 bpm -- 73 bpm   Oxygen  Saturation (Admit) 93 % 98 % 98 % -- 98 %   Oxygen  Saturation (Exercise) 90 % 95 % 93 % -- 93 %   Oxygen  Saturation (Exit) 98 % 98 % 98 % -- 97 %   Rating of Perceived Exertion (Exercise) 13 15 15  -- 15   Perceived Dyspnea (Exercise) 3 3 4  -- 3   Symptoms none none none -- none   Comments results first 2 weeks of exercise -- -- --   Duration -- Progress to 30 minutes of  aerobic without signs/symptoms of  physical distress Progress to 30 minutes of  aerobic without signs/symptoms of physical distress -- Progress to 30 minutes of  aerobic without signs/symptoms of physical distress   Intensity -- THRR unchanged THRR unchanged -- THRR unchanged     Progression   Progression -- Continue to progress workloads to maintain intensity without signs/symptoms of physical distress. Continue to progress workloads to maintain intensity without signs/symptoms of physical distress. -- Continue to progress workloads to maintain intensity without signs/symptoms of physical distress.   Average METs -- 2.1 2.33 -- 2.32     Resistance Training   Training Prescription -- Yes Yes -- Yes   Weight -- 1lb 1lb -- 2 lb   Reps -- 10-15 10-15 -- 10-15     Interval Training   Interval Training -- No No -- No     Oxygen    Oxygen  -- Continuous Continuous -- Continuous   Liters -- 2L 2 -- 2     Recumbant Bike   Level -- 1 2 -- 2   Watts -- 13 13 -- 19   Minutes -- 15 15 -- 15   METs -- 2.87 2.86 -- 2.99     NuStep   Level -- 1 2 -- 4   Minutes -- 15 15 -- 15   METs -- -- 3 -- 2.8     T5 Nustep   Level -- 2 1 -- 1   Minutes -- 15 15 -- 15   METs -- 2 1.9 -- 1.8     Biostep-RELP   Level -- 2 1 -- --   Minutes -- 15 15 -- --   METs -- 2 2 -- --     Track  Laps -- 8  Hall 26 -- 26   Minutes -- 15 15 -- 15   METs -- 1.44 2.41 -- 2.41     Home Exercise Plan   Plans to continue exercise at -- -- -- Home (comment)  Tanya Rhodes currently is walking around her house as well as using hand weights on days she does not attend the program. Home (comment)  Tanya Rhodes currently is walking around her house as well as using hand weights on days she does not attend the program.   Frequency -- -- -- Add 2 additional days to program exercise sessions. Add 2 additional days to program exercise sessions.   Initial Home Exercises Provided -- -- -- 11/08/23 11/08/23     Oxygen    Maintain Oxygen  Saturation -- 88% or higher 88% or  higher -- 88% or higher    Row Name 12/10/23 0800 12/19/23 1100           Response to Exercise   Blood Pressure (Admit) 134/70 134/82      Blood Pressure (Exit) 110/62 124/60      Heart Rate (Admit) 80 bpm 86 bpm      Heart Rate (Exercise) 120 bpm 110 bpm      Heart Rate (Exit) 85 bpm 95 bpm      Oxygen  Saturation (Admit) 98 % 98 %      Oxygen  Saturation (Exercise) 92 % 94 %      Oxygen  Saturation (Exit) 98 % 97 %      Rating of Perceived Exertion (Exercise) 15 15      Perceived Dyspnea (Exercise) 2 2      Symptoms none none      Duration Progress to 30 minutes of  aerobic without signs/symptoms of physical distress Progress to 30 minutes of  aerobic without signs/symptoms of physical distress      Intensity THRR unchanged THRR unchanged        Progression   Progression Continue to progress workloads to maintain intensity without signs/symptoms of physical distress. Continue to progress workloads to maintain intensity without signs/symptoms of physical distress.      Average METs 2.5 2.46        Resistance Training   Training Prescription Yes Yes      Weight 2 lb 2 lb      Reps 10-15 10-15        Interval Training   Interval Training No No        Oxygen    Oxygen  Continuous Continuous      Liters 2 2        Recumbant Bike   Level 2 --      Watts 16 --      Minutes 15 --      METs 3.25 --        NuStep   Level 3 2      Minutes 15 15      METs 2.3 2.8        T5 Nustep   Level 1 --      Minutes 15 --      METs 1.9 --        Track   Laps 30 30      Minutes 15 15      METs 2.63 2.63        Home Exercise Plan   Plans to continue exercise at Home (comment)  Tanya Rhodes currently is walking around her house as well as using hand weights on days she  does not attend the program. Home (comment)  Tanya Rhodes currently is walking around her house as well as using hand weights on days she does not attend the program.      Frequency Add 2 additional days to program exercise  sessions. Add 2 additional days to program exercise sessions.      Initial Home Exercises Provided 11/08/23 11/08/23        Oxygen    Maintain Oxygen  Saturation 88% or higher 88% or higher         Exercise Comments:   Exercise Comments     Row Name 10/15/23 1412           Exercise Comments First full day of exercise!  Patient was oriented to gym and equipment including functions, settings, policies, and procedures.  Patient's individual exercise prescription and treatment plan were reviewed.  All starting workloads were established based on the results of the 6 minute walk test done at initial orientation visit.  The plan for exercise progression was also introduced and progression will be customized based on patient's performance and goals.          Exercise Goals and Review:   Exercise Goals     Row Name 10/09/23 1520             Exercise Goals   Increase Physical Activity Yes       Intervention Provide advice, education, support and counseling about physical activity/exercise needs.;Develop an individualized exercise prescription for aerobic and resistive training based on initial evaluation findings, risk stratification, comorbidities and participant's personal goals.       Expected Outcomes Short Term: Attend rehab on a regular basis to increase amount of physical activity.;Long Term: Add in home exercise to make exercise part of routine and to increase amount of physical activity.;Long Term: Exercising regularly at least 3-5 days a week.       Increase Strength and Stamina Yes       Intervention Provide advice, education, support and counseling about physical activity/exercise needs.;Develop an individualized exercise prescription for aerobic and resistive training based on initial evaluation findings, risk stratification, comorbidities and participant's personal goals.       Expected Outcomes Short Term: Increase workloads from initial exercise prescription for resistance,  speed, and METs.;Short Term: Perform resistance training exercises routinely during rehab and add in resistance training at home;Long Term: Improve cardiorespiratory fitness, muscular endurance and strength as measured by increased METs and functional capacity ( )       Able to understand and use rate of perceived exertion (RPE) scale Yes       Intervention Provide education and explanation on how to use RPE scale       Expected Outcomes Short Term: Able to use RPE daily in rehab to express subjective intensity level;Long Term:  Able to use RPE to guide intensity level when exercising independently       Able to understand and use Dyspnea scale Yes       Intervention Provide education and explanation on how to use Dyspnea scale       Expected Outcomes Short Term: Able to use Dyspnea scale daily in rehab to express subjective sense of shortness of breath during exertion;Long Term: Able to use Dyspnea scale to guide intensity level when exercising independently       Knowledge and understanding of Target Heart Rate Range (THRR) Yes       Intervention Provide education and explanation of THRR including how the numbers were predicted and where  they are located for reference       Expected Outcomes Short Term: Able to state/look up THRR;Short Term: Able to use daily as guideline for intensity in rehab;Long Term: Able to use THRR to govern intensity when exercising independently       Able to check pulse independently Yes       Intervention Provide education and demonstration on how to check pulse in carotid and radial arteries.;Review the importance of being able to check your own pulse for safety during independent exercise       Expected Outcomes Long Term: Able to check pulse independently and accurately;Short Term: Able to explain why pulse checking is important during independent exercise       Understanding of Exercise Prescription Yes       Intervention Provide education, explanation, and written  materials on patient's individual exercise prescription       Expected Outcomes Short Term: Able to explain program exercise prescription;Long Term: Able to explain home exercise prescription to exercise independently          Exercise Goals Re-Evaluation :  Exercise Goals Re-Evaluation     Row Name 10/15/23 1412 10/25/23 0841 11/05/23 1138 11/08/23 1433 11/22/23 1512     Exercise Goal Re-Evaluation   Exercise Goals Review Increase Physical Activity;Able to understand and use rate of perceived exertion (RPE) scale;Knowledge and understanding of Target Heart Rate Range (THRR);Understanding of Exercise Prescription;Able to check pulse independently;Increase Strength and Stamina;Able to understand and use Dyspnea scale Increase Physical Activity;Increase Strength and Stamina;Understanding of Exercise Prescription Increase Physical Activity;Increase Strength and Stamina;Understanding of Exercise Prescription Increase Physical Activity;Able to understand and use Dyspnea scale;Understanding of Exercise Prescription;Knowledge and understanding of Target Heart Rate Range (THRR);Increase Strength and Stamina;Able to understand and use rate of perceived exertion (RPE) scale;Able to check pulse independently Increase Physical Activity;Understanding of Exercise Prescription;Increase Strength and Stamina   Comments Reviewed RPE and dyspnea scale, THR and program prescription with pt today.  Pt voiced understanding and was given a copy of goals to take home. Leanah is off to a good start in the program, and was able to attend her first few sessions during this review period. During these sessions she was able to use the T5 nustep, and biostep at level 2, as well as walk 8 laps along the hallway. We will continue to monitor her progress in the program. Tanya Rhodes is doing well in rehab. She was recently able to increase from 8 to 26 track laps in 20 minutes. She was also able to increase to level 2 on both the T4 nustep  and recumbent bike. We will continue to monitor her progress in the program. Reviewed home exercise with pt today from 2:00 to 2:15.  Pt plans to continue to use hand weight exercises as well as walking around her house for exercise.  Reviewed THR, pulse, RPE, sign and symptoms, pulse oximetery and when to call 911 or MD.  Also discussed weather considerations and indoor options.  Pt voiced understanding. Tanya Rhodes continues to do well in rehab. She increased to level 4 on the T4 nustep and increased to 2lb handweights. She maintained level 2 on the recumbent bike, level 1 on the T5 nustep, and 26 laps on the track. We will continue to monitor her progress in the program.   Expected Outcomes Short: Use RPE daily to regulate intensity.  Long: Follow program prescription in THR. Short: Continue to follow exercise prescription. Long: Continue exercise to improve strength and stamina. Short: Continue  to follow exercise prescription, increase workloads when able. Long: Continue exercise to improve strength and stamina. Short: Continue to exercise at home as well as in the program. Long: Continue exercise plan to improve strength and stamina. Short: Try level 3 on the recumbent bike. Long: Continue exercise to improve strength and stamina.    Row Name 12/10/23 9157 12/10/23 1508 12/19/23 1149         Exercise Goal Re-Evaluation   Exercise Goals Review Increase Physical Activity;Understanding of Exercise Prescription;Increase Strength and Stamina Increase Physical Activity;Understanding of Exercise Prescription;Increase Strength and Stamina Increase Physical Activity;Understanding of Exercise Prescription;Increase Strength and Stamina     Comments Tanya Rhodes is doing well in rehab. She was able to increase her laps on the track from 26 to 30, and was able to maintain level 2 on the recumbent bike. We will continue to monitor her progress in the program. Tanya Rhodes is doing well with her home exercise. She is doing weights  at home with a little bit of resistance in 2 water  bottles. She is also walking regularly outside for her home exercise. She states that she has more difficulty with the walking as it starts to get colder and we discussed finding an indoor option so that she can continue to walk. Tanya Rhodes continues to do well in rehab. She was able to maintain walking 30 laps on the track. She worked at level 2 on the T4 nustep. We will continue to monitor her progress in the program.     Expected Outcomes Short: Try to increase to level 2 on the T4 nustep. Long: Continue exercise to improve strength and stamina. Short: Continue home exercise routine and find an indoor walking option. Long: Continue to exercise independently. Short: Increase to level 3 on T4 nustep. Long: Continue to exercise independently.        Discharge Exercise Prescription (Final Exercise Prescription Changes):  Exercise Prescription Changes - 12/19/23 1100       Response to Exercise   Blood Pressure (Admit) 134/82    Blood Pressure (Exit) 124/60    Heart Rate (Admit) 86 bpm    Heart Rate (Exercise) 110 bpm    Heart Rate (Exit) 95 bpm    Oxygen  Saturation (Admit) 98 %    Oxygen  Saturation (Exercise) 94 %    Oxygen  Saturation (Exit) 97 %    Rating of Perceived Exertion (Exercise) 15    Perceived Dyspnea (Exercise) 2    Symptoms none    Duration Progress to 30 minutes of  aerobic without signs/symptoms of physical distress    Intensity THRR unchanged      Progression   Progression Continue to progress workloads to maintain intensity without signs/symptoms of physical distress.    Average METs 2.46      Resistance Training   Training Prescription Yes    Weight 2 lb    Reps 10-15      Interval Training   Interval Training No      Oxygen    Oxygen  Continuous    Liters 2      NuStep   Level 2    Minutes 15    METs 2.8      Track   Laps 30    Minutes 15    METs 2.63      Home Exercise Plan   Plans to continue exercise  at Home (comment)   Tanya Rhodes currently is walking around her house as well as using hand weights on days she does not attend the  program.   Frequency Add 2 additional days to program exercise sessions.    Initial Home Exercises Provided 11/08/23      Oxygen    Maintain Oxygen  Saturation 88% or higher          Nutrition:  Target Goals: Understanding of nutrition guidelines, daily intake of sodium 1500mg , cholesterol 200mg , calories 30% from fat and 7% or less from saturated fats, daily to have 5 or more servings of fruits and vegetables.  Education: Nutrition 1 -Group instruction provided by verbal, written material, interactive activities, discussions, models, and posters to present general guidelines for heart healthy nutrition including macronutrients, label reading, and promoting whole foods over processed counterparts. Education serves as pensions consultant of discussion of heart healthy eating for all. Written material provided at class time. Flowsheet Row Pulmonary Rehab from 12[facility]19/2023 in [facility] Cardiac and Pulmonary Rehab  Date 10/31/23  Educator jg  Instruction Review Code 1- Verbalizes Understanding      Education: Nutrition 2 -Group instruction provided by verbal, written material, interactive activities, discussions, models, and posters to present general guidelines for heart healthy nutrition including sodium, cholesterol, and saturated fat. Providing guidance of habit forming to improve blood pressure, cholesterol, and body weight. Written material provided at class time. Flowsheet Row Pulmonary Rehab from 12/19/2023 in [facility] Cardiac and Pulmonary Rehab  Date 11/07/23  Educator jg  Instruction Review Code 1- Verbalizes Understanding      Biometrics:  Pre Biometrics - 10/09/23 1520       Pre Biometrics   Height 5' 0.6 (1.539 m)    Weight 102 lb 11.2 oz (46.6 kg)    Waist Circumference 29 inches    Hip Circumference 35 inches    Waist to Hip Ratio 0.83 %    BMI  (Calculated) 19.67    Single Leg Stand 0 seconds           Nutrition Therapy Plan and Nutrition Goals:   Nutrition Assessments:  MEDIFICTS Score Key: >=70 Need to make dietary changes  40-70 Heart Healthy Diet <= 40 Therapeutic Level Cholesterol Diet  Flowsheet Row Pulmonary Rehab from 10/09/2023 in [facility] Cardiac and Pulmonary Rehab  Picture Your Plate Total Score on Admission 54   Picture Your Plate Scores: <59 Unhealthy dietary pattern with much room for improvement. 41-50 Dietary pattern unlikely to meet recommendations for good health and room for improvement. 51-60 More healthful dietary pattern, with some room for improvement.  >60 Healthy dietary pattern, although there may be some specific behaviors that could be improved.   Nutrition Goals Re-Evaluation:  Nutrition Goals Re-Evaluation     Row Name 10/25/23 1425 11/15/23 1412 12/19/23 1417         Goals   Comment Patient was informed on why it is important to maintain a balanced diet when dealing with Respiratory issues. Explained that it takes a lot of energy to breath and when they are short of breath often they will need to have a good diet to help keep up with the calories they are expending for breathing. Patient would like to meet the RD. Patient stated that she attended the group nutrition lecture and has the information she needs for now and will defer RD apt.     Expected Outcome Short: Choose and plan snacks accordingly to patients caloric intake to improve breathing. Long: Maintain a diet independently that meets their caloric intake to aid in daily shortness of breath. Short: meet with RD. Long: adhere to a diet that pertains to her. --  Nutrition Goals Discharge (Final Nutrition Goals Re-Evaluation):  Nutrition Goals Re-Evaluation - 12/19/23 1417       Goals   Comment Patient stated that she attended the group nutrition lecture and has the information she needs for now and will defer RD apt.           Psychosocial: Target Goals: Acknowledge presence or absence of significant depression and/or stress, maximize coping skills, provide positive support system. Participant is able to verbalize types and ability to use techniques and skills needed for reducing stress and depression.   Education: Stress, Anxiety, and Depression - Group verbal and visual presentation to define topics covered.  Reviews how body is impacted by stress, anxiety, and depression.  Also discusses healthy ways to reduce stress and to treat/manage anxiety and depression.  Written material provided at class time.   Education: Sleep Hygiene -Provides group verbal and written instruction about how sleep can affect your health.  Define sleep hygiene, discuss sleep cycles and impact of sleep habits. Review good sleep hygiene tips.    Initial Review & Psychosocial Screening:  Initial Psych Review & Screening - 10/03/23 1426       Initial Review   Current issues with Current Psychotropic Meds;Current Anxiety/Panic      Family Dynamics   Good Support System? Yes    Comments Tanya Rhodes lives with her son and takes care of her if he needs to. She states she has anxiety at times and does not know where it stems from.      Barriers   Psychosocial barriers to participate in program The patient should benefit from training in stress management and relaxation.;Psychosocial barriers identified (see note)      Screening Interventions   Interventions Provide feedback about the scores to participant;To provide support and resources with identified psychosocial needs;Encouraged to exercise    Expected Outcomes Short Term goal: Utilizing psychosocial counselor, staff and physician to assist with identification of specific Stressors or current issues interfering with healing process. Setting desired goal for each stressor or current issue identified.;Long Term Goal: Stressors or current issues are controlled or eliminated.;Short Term  goal: Identification and review with participant of any Quality of Life or Depression concerns found by scoring the questionnaire.;Long Term goal: The participant improves quality of Life and PHQ9 Scores as seen by post scores and/or verbalization of changes          Quality of Life Scores:  Scores of 19 and below usually indicate a poorer quality of life in these areas.  A difference of  2-3 points is a clinically meaningful difference.  A difference of 2-3 points in the total score of the Quality of Life Index has been associated with significant improvement in overall quality of life, self-image, physical symptoms, and general health in studies assessing change in quality of life.  PHQ-9: Review Flowsheet       10/25/2023 10/09/2023  Depression screen PHQ 2/9  Decreased Interest 0 0  Down, Depressed, Hopeless 0 2  PHQ - 2 Score 0 2  Altered sleeping 1 1  Tired, decreased energy 1 3  Change in appetite 0 0  Feeling bad or failure about yourself  0 0  Trouble concentrating 1 1  Moving slowly or fidgety/restless 0 0  Suicidal thoughts 0 0  PHQ-9 Score 3  7   Difficult doing work/chores Somewhat difficult Somewhat difficult    Details       Data saved with a previous flowsheet row definition  Interpretation of Total Score  Total Score Depression Severity:  1-4 = Minimal depression, 5-9 = Mild depression, 10-14 = Moderate depression, 15-19 = Moderately severe depression, 20-27 = Severe depression   Psychosocial Evaluation and Intervention:  Psychosocial Evaluation - 10/03/23 1427       Psychosocial Evaluation & Interventions   Interventions Encouraged to exercise with the program and follow exercise prescription;Relaxation education;Stress management education    Comments Tanya Rhodes lives with her son and takes care of her if he needs to. She states she has anxiety at times and does not know where it stems from.    Expected Outcomes Short: Start [facility] to help with  mood. Long: Maintain a healthy mental state    Continue Psychosocial Services  Follow up required by staff          Psychosocial Re-Evaluation:  Psychosocial Re-Evaluation     Row Name 10/25/23 1432 11/15/23 1424 12/19/23 1426         Psychosocial Re-Evaluation   Current issues with Current Stress Concerns;Current Anxiety/Panic Current Anxiety/Panic Current Anxiety/Panic     Comments Reviewed patient health questionnaire (PHQ-9) with patient for follow up. Previously, patients score indicated signs/symptoms of depression.  Reviewed to see if patient is improving symptom wise while in program.  Score improved and patient states that it is because she has been able to exercise more. Patient states that she has some anxiety. She takes medication for her anxiety. She states that her son has alot of health issues and that he lives with her. Patient states that she does not sleep good at night because of her legs being restless. She does take medicaiton for this. She states no new stress or anxiety aside from missing reletives during the holidays but she is grateful for the memories she has with them.     Expected Outcomes Short: Continue to attend [facility] regularly for regular exercise and social engagement. Long: Continue to improve symptoms and manage a positive mental state. Short: attened [facility] to keep stress at a minimum. Long: take medication as prescribed to help mood. Short: attened [facility] to keep stress at a minimum. Long: take medication as prescribed to help mood.     Interventions Encouraged to attend Pulmonary Rehabilitation for the exercise Encouraged to attend Pulmonary Rehabilitation for the exercise Encouraged to attend Pulmonary Rehabilitation for the exercise     Continue Psychosocial Services  Follow up required by staff Follow up required by staff Follow up required by staff        Psychosocial Discharge (Final Psychosocial Re-Evaluation):  Psychosocial Re-Evaluation  - 12/19/23 1426       Psychosocial Re-Evaluation   Current issues with Current Anxiety/Panic    Comments Patient states that she does not sleep good at night because of her legs being restless. She does take medicaiton for this. She states no new stress or anxiety aside from missing reletives during the holidays but she is grateful for the memories she has with them.    Expected Outcomes Short: attened [facility] to keep stress at a minimum. Long: take medication as prescribed to help mood.    Interventions Encouraged to attend Pulmonary Rehabilitation for the exercise    Continue Psychosocial Services  Follow up required by staff          Education: Education Goals: Education classes will be provided on a weekly basis, covering required topics. Participant will state understanding/return demonstration of topics presented.  Learning Barriers/Preferences:  Learning Barriers/Preferences - 10/03/23 1424  Learning Barriers/Preferences   Learning Barriers Hearing    Learning Preferences None          General Pulmonary Education Topics:  Infection Prevention: - Provides verbal and written material to individual with discussion of infection control including proper hand washing and proper equipment cleaning during exercise session. Flowsheet Row Pulmonary Rehab from 12/19/2023 in [facility] Cardiac and Pulmonary Rehab  Date 10/09/23  Educator [facility]  Instruction Review Code 1- Verbalizes Understanding    Falls Prevention: - Provides verbal and written material to individual with discussion of falls prevention and safety. Flowsheet Row Pulmonary Rehab from 12/19/2023 in [facility] Cardiac and Pulmonary Rehab  Date 10/09/23  Educator [facility]  Instruction Review Code 1- Verbalizes Understanding    Chronic Lung Disease Review: - Group verbal instruction with posters, models, PowerPoint presentations and videos,  to review new updates, new respiratory medications, new advancements in procedures  and treatments. Providing information on websites and 800 numbers for continued self-education. Includes information about supplement oxygen , available portable oxygen  systems, continuous and intermittent flow rates, oxygen  safety, concentrators, and Medicare reimbursement for oxygen . Explanation of Pulmonary Drugs, including class, frequency, complications, importance of spacers, rinsing mouth after steroid MDI's, and proper cleaning methods for nebulizers. Review of basic lung anatomy and physiology related to function, structure, and complications of lung disease. Review of risk factors. Discussion about methods for diagnosing sleep apnea and types of masks and machines for OSA. Includes a review of the use of types of environmental controls: home humidity, furnaces, filters, dust mite/pet prevention, HEPA vacuums. Discussion about weather changes, air quality and the benefits of nasal washing. Instruction on Warning signs, infection symptoms, calling MD promptly, preventive modes, and value of vaccinations. Review of effective airway clearance, coughing and/or vibration techniques. Emphasizing that all should Create an Action Plan. Written material provided at class time. Flowsheet Row Pulmonary Rehab from 12/19/2023 in [facility] Cardiac and Pulmonary Rehab  Education need identified 10/09/23    AED/CPR: - Group verbal and written instruction with the use of models to demonstrate the basic use of the AED with the basic ABC's of resuscitation.    Tests and Procedures:  - Group verbal and visual presentation and models provide information about basic cardiac anatomy and function. Reviews the testing methods done to diagnose heart disease and the outcomes of the test results. Describes the treatment choices: Medical Management, Angioplasty, or Coronary Bypass Surgery for treating various heart conditions including Myocardial Infarction, Angina, Valve Disease, and Cardiac Arrhythmias.  Written material  provided at class time.   Medication Safety: - Group verbal and visual instruction to review commonly prescribed medications for heart and lung disease. Reviews the medication, class of the drug, and side effects. Includes the steps to properly store meds and maintain the prescription regimen.  Written material given at graduation. Flowsheet Row Pulmonary Rehab from 12/19/2023 in [facility] Cardiac and Pulmonary Rehab  Date 11/21/23  Educator lc  Instruction Review Code 1- Verbalizes Understanding    Other: -Provides group and verbal instruction on various topics (see comments)   Knowledge Questionnaire Score:  Knowledge Questionnaire Score - 10/09/23 1343       Knowledge Questionnaire Score   Pre Score 13/18           Core Components/Risk Factors/Patient Goals at Admission:  Personal Goals and Risk Factors at Admission - 10/03/23 1424       Core Components/Risk Factors/Patient Goals on Admission    Weight Management Yes;Weight Gain    Intervention Weight Management: Develop a  combined nutrition and exercise program designed to reach desired caloric intake, while maintaining appropriate intake of nutrient and fiber, sodium and fats, and appropriate energy expenditure required for the weight goal.;Weight Management: Provide education and appropriate resources to help participant work on and attain dietary goals.;Weight Management/Obesity: Establish reasonable short term and long term weight goals.    Expected Outcomes Short Term: Continue to assess and modify interventions until short term weight is achieved;Weight Maintenance: Understanding of the daily nutrition guidelines, which includes 25-35% calories from fat, 7% or less cal from saturated fats, less than 200mg  cholesterol, less than 1.5gm of sodium, & 5 or more servings of fruits and vegetables daily;Understanding recommendations for meals to include 15-35% energy as protein, 25-35% energy from fat, 35-60% energy from carbohydrates,  less than 200mg  of dietary cholesterol, 20-35 gm of total fiber daily;Understanding of distribution of calorie intake throughout the day with the consumption of 4-5 meals/snacks;Weight Gain: Understanding of general recommendations for a high calorie, high protein meal plan that promotes weight gain by distributing calorie intake throughout the day with the consumption for 4-5 meals, snacks, and/or supplements    Improve shortness of breath with ADL's Yes    Intervention Provide education, individualized exercise plan and daily activity instruction to help decrease symptoms of SOB with activities of daily living.    Expected Outcomes Short Term: Improve cardiorespiratory fitness to achieve a reduction of symptoms when performing ADLs;Long Term: Be able to perform more ADLs without symptoms or delay the onset of symptoms    Hypertension Yes    Intervention Provide education on lifestyle modifcations including regular physical activity/exercise, weight management, moderate sodium restriction and increased consumption of fresh fruit, vegetables, and low fat dairy, alcohol moderation, and smoking cessation.;Monitor prescription use compliance.    Expected Outcomes Short Term: Continued assessment and intervention until BP is < 140/45mm HG in hypertensive participants. < 130/45mm HG in hypertensive participants with diabetes, heart failure or chronic kidney disease.;Long Term: Maintenance of blood pressure at goal levels.    Lipids Yes    Intervention Provide education and support for participant on nutrition & aerobic/resistive exercise along with prescribed medications to achieve LDL 70mg , HDL >40mg .    Expected Outcomes Short Term: Participant states understanding of desired cholesterol values and is compliant with medications prescribed. Participant is following exercise prescription and nutrition guidelines.;Long Term: Cholesterol controlled with medications as prescribed, with individualized exercise RX  and with personalized nutrition plan. Value goals: LDL < 70mg , HDL > 40 mg.          Education:Diabetes - Individual verbal and written instruction to review signs/symptoms of diabetes, desired ranges of glucose level fasting, after meals and with exercise. Acknowledge that pre and post exercise glucose checks will be done for 3 sessions at entry of program.   Know Your Numbers and Heart Failure: - Group verbal and visual instruction to discuss disease risk factors for cardiac and pulmonary disease and treatment options.  Reviews associated critical values for Overweight/Obesity, Hypertension, Cholesterol, and Diabetes.  Discusses basics of heart failure: signs/symptoms and treatments.  Introduces Heart Failure Zone chart for action plan for heart failure. Written material provided at class time. Flowsheet Row Pulmonary Rehab from 12/19/2023 in [facility] Cardiac and Pulmonary Rehab  Date 12/05/23  Educator [facility]  Instruction Review Code 1- Verbalizes Understanding    Core Components/Risk Factors/Patient Goals Review:   Goals and Risk Factor Review     Row Name 10/25/23 1425 11/15/23 1419 12/19/23 1422  Core Components/Risk Factors/Patient Goals Review   Personal Goals Review Improve shortness of breath with ADL's Hypertension Weight Management/Obesity;Lipids;Hypertension;Improve shortness of breath with ADL's     Review Spoke to patient about their shortness of breath and what they can do to improve. Patient has been informed of breathing techniques when starting the program. Patient is informed to tell staff if they have had any med changes and that certain meds they are taking or not taking can be causing shortness of breath. Check patients wrist blood pressure cuff agains our manual pressure. Her cuff was off by 30 points systolic. She states that she will not use that one and try a different option. Tanya Rhodes reports that she does take all BP and cholesterol meds. She has not been taking  BP at home and states that the one she has is not accurate. She was encouraged to buy a new one is she is able and to start checking BP at home. She reports that her weight has been steady but she continues to want to try to work towards gaining weight. She states that she is comfortable using PLB to help control SOB.     Expected Outcomes Short: Attend [facility] regularly to improve shortness of breath with ADL's. Long: maintain independence with ADL's Short: monitor blood pressure at home. Long: check blood pressure at home independently. Short: start monitoring BP at home. Long: control cardiac risk factors.        Core Components/Risk Factors/Patient Goals at Discharge (Final Review):   Goals and Risk Factor Review - 12/19/23 1422       Core Components/Risk Factors/Patient Goals Review   Personal Goals Review Weight Management/Obesity;Lipids;Hypertension;Improve shortness of breath with ADL's    Review Tanya Rhodes reports that she does take all BP and cholesterol meds. She has not been taking BP at home and states that the one she has is not accurate. She was encouraged to buy a new one is she is able and to start checking BP at home. She reports that her weight has been steady but she continues to want to try to work towards gaining weight. She states that she is comfortable using PLB to help control SOB.    Expected Outcomes Short: start monitoring BP at home. Long: control cardiac risk factors.          ITP Comments:  ITP Comments     Row Name 10/03/23 1428 10/09/23 1512 10/15/23 1412 10/31/23 0945 11/28/23 1255   ITP Comments Virtual Visit completed. Patient informed on EP and RD appointment and 6 Minute walk test. Patient also informed of patient health questionnaires on My Chart. Patient Verbalizes understanding. Visit diagnosis can be found in [facility] 09/13/2023. Completed and gym orientation for pulmonary rehab. Initial ITP created and sent for review to Dr. Fuad Aleskerov, Medical  Director. First full day of exercise!  Patient was oriented to gym and equipment including functions, settings, policies, and procedures.  Patient's individual exercise prescription and treatment plan were reviewed.  All starting workloads were established based on the results of the 6 minute walk test done at initial orientation visit.  The plan for exercise progression was also introduced and progression will be customized based on patient's performance and goals. 30 Day review completed. Medical Director ITP review done; changes made as directed and signed approval by Medical Director. 30 Day review completed. Medical Director ITP review done, changes made as directed, and signed approval by Medical Director.    Row Name 12/26/23 [phone]  ITP Comments 30 Day review completed. Medical Director ITP review done, changes made as directed, and signed approval by Medical Director.          Comments: 30 day review      [1]  Current Outpatient Medications:    acetaminophen  (TYLENOL ) 500 MG tablet, Take 500 mg by mouth every 6 (six) hours as needed for mild pain, moderate pain, fever or headache. , Disp: , Rfl:    albuterol  (PROVENTIL  HFA;VENTOLIN  HFA) 108 (90 Base) MCG/ACT inhaler, Inhale 2 puffs into the lungs every 4 (four) hours as needed for wheezing or shortness of breath., Disp: , Rfl:    albuterol  (PROVENTIL ) (2.5 MG/3ML) 0.083% nebulizer solution, Take 2.5 mg by nebulization every 6 (six) hours as needed for wheezing or shortness of breath., Disp: , Rfl:    ALPRAZolam  (XANAX ) 0.5 MG tablet, Take 0.5 mg by mouth daily as needed. (Patient not taking: Reported on 10/03/2023), Disp: , Rfl:    ALPRAZolam  (XANAX ) 0.5 MG tablet, Take by mouth. (Patient not taking: Reported on 10/03/2023), Disp: , Rfl:    ALPRAZolam  (XANAX ) 0.5 MG tablet, Take 0.5 mg by mouth., Disp: , Rfl:    amLODipine (NORVASC) 2.5 MG tablet, Take 2.5 mg by mouth., Disp: , Rfl:    budesonide -formoterol  (SYMBICORT ) 160-4.5  MCG/ACT inhaler, Inhale 2 puffs into the lungs 2 (two) times daily., Disp: , Rfl:    cyanocobalamin  1000 MCG tablet, Take 1,000 mcg by mouth daily., Disp: , Rfl:    escitalopram  (LEXAPRO ) 10 MG tablet, Take 10 mg by mouth at bedtime. (Patient not taking: Reported on 10/03/2023), Disp: , Rfl: 0   fluticasone  (FLONASE ) 50 MCG/ACT nasal spray, Place 2 sprays into both nostrils at bedtime., Disp: , Rfl:    furosemide  (LASIX ) 20 MG tablet, Take 20 mg by mouth at bedtime. (Patient not taking: Reported on 10/03/2023), Disp: , Rfl:    gabapentin  (NEURONTIN ) 300 MG capsule, Take 300 mg by mouth at bedtime., Disp: , Rfl:    lisinopril  (PRINIVIL ,ZESTRIL ) 2.5 MG tablet, Take 2.5 mg by mouth at bedtime., Disp: , Rfl:    lisinopril  (ZESTRIL ) 2.5 MG tablet, Take 1 tablet by mouth daily. (Patient not taking: Reported on 10/03/2023), Disp: , Rfl:    loperamide (IMODIUM) 2 MG capsule, Take 2 mg by mouth as needed for diarrhea or loose stools., Disp: , Rfl:    metoprolol  succinate (TOPROL -XL) 25 MG 24 hr tablet, Take 25 mg by mouth daily. (Patient not taking: Reported on 10/03/2023), Disp: , Rfl:    OXYGEN , Inhale 2 L into the lungs continuous., Disp: , Rfl:    pantoprazole (PROTONIX) 40 MG tablet, Take 1 tablet by mouth at bedtime. (Patient not taking: Reported on 10/03/2023), Disp: , Rfl:    pantoprazole (PROTONIX) 40 MG tablet, Take 1 tablet by mouth daily., Disp: , Rfl:    simvastatin  (ZOCOR ) 20 MG tablet, Take 20 mg by mouth at bedtime. (Patient not taking: Reported on 10/03/2023), Disp: , Rfl:    SPIRIVA  RESPIMAT 2.5 MCG/ACT AERS, Inhale 2 puffs into the lungs every morning. (Patient not taking: Reported on 10/03/2023), Disp: , Rfl: 0   Tiotropium Bromide  Monohydrate (SPIRIVA  RESPIMAT) 2.5 MCG/ACT AERS, Inhale 5 mcg into the lungs., Disp: , Rfl:    traMADol  (ULTRAM ) 50 MG tablet, Take 50 mg by mouth 2 (two) times daily as needed for moderate pain. (Patient not taking: Reported on 10/03/2023), Disp: , Rfl:    traMADol   (ULTRAM ) 50 MG tablet, Take 50 mg by mouth., Disp: ,  Rfl:    Vitamin D , Ergocalciferol , (DRISDOL ) 50000 units CAPS capsule, Take 50,000 Units by mouth every 7 (seven) days. Pt takes on Saturday., Disp: , Rfl:  [2]  Social History Tobacco Use  Smoking Status Former   Current packs/day: 0.00   Average packs/day: 1.5 packs/day for 52.5 years (78.8 ttl pk-yrs)   Types: Cigarettes   Start date: 05/10/1941   Quit date: 11/09/1993   Years since quitting: 30.1  Smokeless Tobacco Never

## 2023-12-27 ENCOUNTER — Encounter

## 2023-12-27 DIAGNOSIS — J449 Chronic obstructive pulmonary disease, unspecified: Secondary | ICD-10-CM

## 2023-12-27 NOTE — Progress Notes (Signed)
 Daily Session Note  Patient Details  Name: Tanya Rhodes MRN: [id number] Date of Birth: [birth year] Referring Provider:   Flowsheet Row Pulmonary Rehab from 10/09/2023 in [facility] Cardiac and Pulmonary Rehab  Referring Provider Dr. Lavelle Servant    Encounter Date: 12/27/2023  Check In:  Session Check In - 12/27/23 1344       Check-In   Supervising physician immediately available to respond to emergencies See telemetry face sheet for immediately available ER MD    Location [facility]-Cardiac & Pulmonary Rehab    Staff Present Maxon Conetta BS, Exercise Physiologist;Joseph Hood RCP,RRT,BSRT;Tkai Large RN,BSN;Noah Tickle, BS, Exercise Physiologist    Virtual Visit No    Medication changes reported     No    Fall or balance concerns reported    No    Warm-up and Cool-down Performed on first and last piece of equipment    Resistance Training Performed Yes    VAD Patient? No    PAD/SET Patient? No      Pain Assessment   Currently in Pain? No/denies             Tobacco Use History[1]  Goals Met:  Independence with exercise equipment Exercise tolerated well No report of concerns or symptoms today Strength training completed today  Goals Unmet:  Not Applicable  Comments: Pt able to follow exercise prescription today without complaint.  Will continue to monitor for progression.    Dr. Oneil Pinal is Medical Director for [facility] Cardiac Rehabilitation.  Dr. Fuad Aleskerov is Medical Director for [facility] Pulmonary Rehabilitation.    [1]  Social History Tobacco Use  Smoking Status Former   Current packs/day: 0.00   Average packs/day: 1.5 packs/day for 52.5 years (78.8 ttl pk-yrs)   Types: Cigarettes   Start date: 05/10/1941   Quit date: 11/09/1993   Years since quitting: 30.1  Smokeless Tobacco Never

## 2023-12-28 ENCOUNTER — Ambulatory Visit
Admission: RE | Admit: 2023-12-28 | Discharge: 2023-12-28 | Disposition: A | Discharge status: Home / Self Care | Source: Ambulatory Visit | Attending: Internal Medicine | Admitting: Internal Medicine

## 2023-12-28 DIAGNOSIS — Z1231 Encounter for screening mammogram for malignant neoplasm of breast: Secondary | ICD-10-CM | POA: Insufficient documentation

## 2023-12-31 ENCOUNTER — Encounter: Admitting: Emergency Medicine

## 2023-12-31 DIAGNOSIS — J449 Chronic obstructive pulmonary disease, unspecified: Secondary | ICD-10-CM

## 2023-12-31 NOTE — Progress Notes (Signed)
 Daily Session Note  Patient Details  Name: Tanya Rhodes MRN: [id number] Date of Birth: 10-25-1940 Referring Provider:   Flowsheet Row Pulmonary Rehab from 10/09/2023 in [facility] Cardiac and Pulmonary Rehab  Referring Provider Dr. Lavelle Servant    Encounter Date: 12/31/2023  Check In:  Session Check In - 12/31/23 1421       Check-In   Supervising physician immediately available to respond to emergencies See telemetry face sheet for immediately available ER MD    Location [facility]-Cardiac & Pulmonary Rehab    Staff Present Leita Franks RN,BSN;Noah Tickle, BS, Exercise Physiologist;Kristen Coble RN,BC,MSN;Margaret Best, MS, Exercise Physiologist    Virtual Visit No    Medication changes reported     No    Fall or balance concerns reported    No    Tobacco Cessation No Change    Warm-up and Cool-down Performed on first and last piece of equipment    Resistance Training Performed Yes    VAD Patient? No    PAD/SET Patient? No      Pain Assessment   Currently in Pain? No/denies             Tobacco Use History[1]  Goals Met:  Proper associated with RPD/PD & O2 Sat Independence with exercise equipment Using PLB without cueing & demonstrates good technique Exercise tolerated well No report of concerns or symptoms today Strength training completed today  Goals Unmet:  Not Applicable  Comments: Pt able to follow exercise prescription today without complaint.  Will continue to monitor for progression.    Dr. Oneil Pinal is Medical Director for [facility] Cardiac Rehabilitation.  Dr. Fuad Aleskerov is Medical Director for [facility] Pulmonary Rehabilitation.    [1]  Social History Tobacco Use  Smoking Status Former   Current packs/day: 0.00   Average packs/day: 1.5 packs/day for 52.5 years (78.8 ttl pk-yrs)   Types: Cigarettes   Start date: 05/10/1941   Quit date: 11/09/1993   Years since quitting: 30.1  Smokeless Tobacco Never

## 2024-01-02 ENCOUNTER — Encounter

## 2024-01-07 ENCOUNTER — Encounter: Admitting: *Deleted

## 2024-01-07 DIAGNOSIS — J449 Chronic obstructive pulmonary disease, unspecified: Secondary | ICD-10-CM | POA: Diagnosis not present

## 2024-01-07 NOTE — Progress Notes (Signed)
 Daily Session Note  Patient Details  Name: Tanya Rhodes MRN: [id number] Date of Birth: 1940/12/21 Referring Provider:   Flowsheet Row Pulmonary Rehab from 10/09/2023 in [facility] Cardiac and Pulmonary Rehab  Referring Provider Dr. Lavelle Servant    Encounter Date: 01/07/2024  Check In:  Session Check In - 01/07/24 1431       Check-In   Supervising physician immediately available to respond to emergencies See telemetry face sheet for immediately available ER MD    Location [facility]-Cardiac & Pulmonary Rehab    Staff Present Devaughn Jaeger, BS, Exercise Physiologist;Laura Cates RN,BSN;Phelan Goers RN,BSN;Jason Elnor RDN,LDN    Virtual Visit No    Medication changes reported     No    Fall or balance concerns reported    No    Warm-up and Cool-down Performed on first and last piece of equipment    Resistance Training Performed Yes    VAD Patient? No    PAD/SET Patient? No      Pain Assessment   Currently in Pain? No/denies             Tobacco Use History[1]  Goals Met:  Proper associated with RPD/PD & O2 Sat Independence with exercise equipment Exercise tolerated well No report of concerns or symptoms today Strength training completed today  Goals Unmet:  Not Applicable  Comments: Pt able to follow exercise prescription today without complaint.  Will continue to monitor for progression.    Dr. Oneil Pinal is Medical Director for [facility] Cardiac Rehabilitation.  Dr. Fuad Aleskerov is Medical Director for [facility] Pulmonary Rehabilitation.    [1]  Social History Tobacco Use  Smoking Status Former   Current packs/day: 0.00   Average packs/day: 1.5 packs/day for 52.5 years (78.8 ttl pk-yrs)   Types: Cigarettes   Start date: 05/10/1941   Quit date: 11/09/1993   Years since quitting: 30.1  Smokeless Tobacco Never

## 2024-01-09 ENCOUNTER — Encounter

## 2024-01-09 DIAGNOSIS — J449 Chronic obstructive pulmonary disease, unspecified: Secondary | ICD-10-CM | POA: Diagnosis not present

## 2024-01-09 NOTE — Progress Notes (Signed)
 Daily Session Note  Patient Details  Name: Tanya Rhodes MRN: [id number] Date of Birth: [birth year] Referring Provider:   Flowsheet Row Pulmonary Rehab from 10/09/2023 in [facility] Cardiac and Pulmonary Rehab  Referring Provider Dr. Lavelle Servant    Encounter Date: 01/09/2024  Check In:  Session Check In - 01/09/24 1340       Check-In   Supervising physician immediately available to respond to emergencies See telemetry face sheet for immediately available ER MD    Location [facility]-Cardiac & Pulmonary Rehab    Staff Present Burnard Davenport RN,BSN,MPA;Laura Cates RN,BSN;Noah Laird, BS, Exercise Physiologist;Laureen Delores, BS, RRT, CPFT    Virtual Visit No    Medication changes reported     No    Fall or balance concerns reported    No    Tobacco Cessation No Change    Warm-up and Cool-down Performed on first and last piece of equipment    Resistance Training Performed Yes    VAD Patient? No    PAD/SET Patient? No      Pain Assessment   Currently in Pain? No/denies             Tobacco Use History[1]  Goals Met:  Proper associated with RPD/PD & O2 Sat Independence with exercise equipment Using PLB without cueing & demonstrates good technique Exercise tolerated well No report of concerns or symptoms today Strength training completed today  Goals Unmet:  Not Applicable  Comments: Pt able to follow exercise prescription today without complaint.  Will continue to monitor for progression.    Dr. Oneil Pinal is Medical Director for [facility] Cardiac Rehabilitation.  Dr. Fuad Aleskerov is Medical Director for [facility] Pulmonary Rehabilitation.    [1]  Social History Tobacco Use  Smoking Status Former   Current packs/day: 0.00   Average packs/day: 1.5 packs/day for 52.5 years (78.8 ttl pk-yrs)   Types: Cigarettes   Start date: 05/10/1941   Quit date: 11/09/1993   Years since quitting: 30.1  Smokeless Tobacco Never

## 2024-01-14 ENCOUNTER — Encounter: Attending: Specialist

## 2024-01-16 ENCOUNTER — Encounter: Attending: Specialist

## 2024-01-16 VITALS — Ht 60.6 in | Wt 107.2 lb

## 2024-01-16 DIAGNOSIS — J449 Chronic obstructive pulmonary disease, unspecified: Secondary | ICD-10-CM | POA: Diagnosis present

## 2024-01-16 NOTE — Patient Instructions (Signed)
 Discharge Patient Instructions  Patient Details  Name: Tanya Rhodes MRN: [id number] Date of Birth: 1940-08-16 Referring Provider:  Sadie Manna, MD   Number of Visits: 63  Reason for Discharge:  Patient reached a stable level of exercise. Patient independent in their exercise. Patient has met program and personal goals.  Smoking History:  Social History   Tobacco Use  Smoking Status Former   Current packs/day: 0.00   Average packs/day: 1.5 packs/day for 52.5 years (78.8 ttl pk-yrs)   Types: Cigarettes   Start date: 05/10/1941   Quit date: 11/09/1993   Years since quitting: 30.2  Smokeless Tobacco Never    Diagnosis:  COPD, moderate (HCC)  Initial Exercise Prescription:  Initial Exercise Prescription - 10/09/23 1500       Date of Initial Exercise RX and Referring Provider   Date 10/09/23    Referring Provider Dr. Lavelle Servant      Oxygen    Oxygen  Continuous    Liters 2L    Maintain Oxygen  Saturation 88% or higher      Recumbant Bike   Level 2    RPM 50    Watts 25    Minutes 15    METs 1.61      NuStep   Level 2    SPM 80    Minutes 15    METs 1.61      REL-XR   Level 2    Watts 25    Speed 50    Minutes 15    METs 1.61      T5 Nustep   Level 2    SPM 80    Minutes 15    METs 1.61      Biostep-RELP   Level 2    SPM 50    Minutes 15    METs 1.61      Track   Laps 20    Minutes 15    METs 2.09      Prescription Details   Duration Progress to 30 minutes of continuous aerobic without signs/symptoms of physical distress      Intensity   THRR 40-80% of Max Heartrate 109-127    Ratings of Perceived Exertion 11-13    Perceived Dyspnea 0-4      Progression   Progression Continue to progress workloads to maintain intensity without signs/symptoms of physical distress.      Resistance Training   Training Prescription Yes    Weight 1lb    Reps 10-15          Discharge Exercise Prescription (Final Exercise Prescription  Changes):  Exercise Prescription Changes - 01/07/24 0800       Response to Exercise   Blood Pressure (Admit) 118/54    Blood Pressure (Exit) 140/64    Heart Rate (Admit) 86 bpm    Heart Rate (Exercise) 103 bpm    Heart Rate (Exit) 87 bpm    Oxygen  Saturation (Admit) 99 %    Oxygen  Saturation (Exercise) 95 %    Oxygen  Saturation (Exit) 96 %    Rating of Perceived Exertion (Exercise) 15    Perceived Dyspnea (Exercise) 2    Symptoms none    Duration Progress to 30 minutes of  aerobic without signs/symptoms of physical distress    Intensity THRR unchanged      Progression   Progression Continue to progress workloads to maintain intensity without signs/symptoms of physical distress.    Average METs 2.4      Resistance Training  Weight 2 lb    Reps 10-15      Interval Training   Interval Training No      Oxygen    Oxygen  Continuous    Liters 2      Recumbant Bike   Level 2    Watts 18    Minutes 15    METs 3.2      NuStep   Level 2    Minutes 15    METs 2.8      T5 Nustep   Level 2    Minutes 15    METs 1.9      Biostep-RELP   Level 1    Minutes 15    METs 2      Track   Laps 25    Minutes 15    METs 2.36      Home Exercise Plan   Plans to continue exercise at Home (comment)   Elena currently is walking around her house as well as using hand weights on days she does not attend the program.   Frequency Add 2 additional days to program exercise sessions.    Initial Home Exercises Provided 11/08/23      Oxygen    Maintain Oxygen  Saturation 88% or higher          Functional Capacity:  6 Minute Walk     Row Name 10/09/23 1513 01/16/24 1407       6 Minute Walk   Phase Initial Discharge    Distance 770 feet 900 feet    Distance % Change -- 17 %    Distance Feet Change -- 130 ft    Walk Time 6 minutes 6 minutes    # of Rest Breaks 0 0    MPH 1.45 1.7    METS 1.61 2    RPE 13 13    Perceived Dyspnea  3 1    VO2 Peak 5.64 6.83    Symptoms No  No    Resting HR 91 bpm 80 bpm    Resting BP 146/62 128/80    Resting Oxygen  Saturation  93 % 99 %    Exercise Oxygen  Saturation  during 6 min walk 90 % 93 %    Max Ex. HR 102 bpm 109 bpm    Max Ex. BP 142/68 152/78    2 Minute Post BP 138/62 140/78      Interval HR   1 Minute HR 90 88    2 Minute HR 91 98    3 Minute HR 93 98    4 Minute HR 99 95    5 Minute HR 99 102    6 Minute HR 102 109    2 Minute Post HR 86 91    Interval Heart Rate? Yes Yes      Interval Oxygen    Interval Oxygen ? Yes Yes    Baseline Oxygen  Saturation % 93 % 99 %    1 Minute Oxygen  Saturation % 97 % 99 %    1 Minute Liters of Oxygen  2 L 2 L    2 Minute Oxygen  Saturation % 97 % 97 %    2 Minute Liters of Oxygen  2 L 2 L    3 Minute Oxygen  Saturation % 90 % 96 %    3 Minute Liters of Oxygen  2 L 0 L    4 Minute Oxygen  Saturation % 91 % 94 %    4 Minute Liters of Oxygen  2 L 0 L  5 Minute Oxygen  Saturation % 92 % 93 %    5 Minute Liters of Oxygen  2 L 2 L    6 Minute Oxygen  Saturation % 96 % 93 %    6 Minute Liters of Oxygen  2 L 2 L    2 Minute Post Oxygen  Saturation % 98 % 97 %    2 Minute Post Liters of Oxygen  2 L 2 L       Nutrition & Weight - Outcomes:  Pre Biometrics - 10/09/23 1520       Pre Biometrics   Height 5' 0.6 (1.539 m)    Weight 102 lb 11.2 oz (46.6 kg)    Waist Circumference 29 inches    Hip Circumference 35 inches    Waist to Hip Ratio 0.83 %    BMI (Calculated) 19.67    Single Leg Stand 0 seconds          Post Biometrics - 01/16/24 1412        Post  Biometrics   Height 5' 0.6 (1.539 m)    Weight 107 lb 3.2 oz (48.6 kg)    Waist Circumference 31 inches    Hip Circumference 36 inches    Waist to Hip Ratio 0.86 %    BMI (Calculated) 20.53    Single Leg Stand 1.34 seconds          Goals reviewed with patient; copy given to patient.

## 2024-01-16 NOTE — Progress Notes (Signed)
 Daily Session Note  Patient Details  Name: Tanya Rhodes MRN: [id number] Date of Birth: 12-28-1940 Referring Provider:   Flowsheet Row Pulmonary Rehab from 10/09/2023 in [facility] Cardiac and Pulmonary Rehab  Referring Provider Dr. Lavelle Servant    Encounter Date: 01/16/2024  Check In:  Session Check In - 01/16/24 1355       Check-In   Supervising physician immediately available to respond to emergencies See telemetry face sheet for immediately available ER MD    Location [facility]-Cardiac & Pulmonary Rehab    Staff Present Burnard Davenport RN,BSN,MPA;Laura Cates RN,BSN;Noah Laird, BS, Exercise Physiologist;Ellamarie Naeve Dyane HECKLE, ACSM CEP, Exercise Physiologist    Virtual Visit No    Medication changes reported     No    Fall or balance concerns reported    No    Tobacco Cessation No Change    Warm-up and Cool-down Performed on first and last piece of equipment    Resistance Training Performed Yes    VAD Patient? No    PAD/SET Patient? No      Pain Assessment   Currently in Pain? No/denies             Tobacco Use History[1]  Goals Met:  Proper associated with RPD/PD & O2 Sat Independence with exercise equipment Using PLB without cueing & demonstrates good technique Exercise tolerated well No report of concerns or symptoms today Strength training completed today  Goals Unmet:  Not Applicable  Comments: Pt able to follow exercise prescription today without complaint.  Will continue to monitor for progression.   6 Minute Walk     Row Name 10/09/23 1513 01/16/24 1407       6 Minute Walk   Phase Initial Discharge    Distance 770 feet 900 feet    Distance % Change -- 17 %    Distance Feet Change -- 130 ft    Walk Time 6 minutes 6 minutes    # of Rest Breaks 0 0    MPH 1.45 1.7    METS 1.61 2    RPE 13 13    Perceived Dyspnea  3 1    VO2 Peak 5.64 6.83    Symptoms No No    Resting HR 91 bpm 80 bpm    Resting BP 146/62 128/80    Resting Oxygen  Saturation  93 % 99 %     Exercise Oxygen  Saturation  during 6 min walk 90 % 93 %    Max Ex. HR 102 bpm 109 bpm    Max Ex. BP 142/68 152/78    2 Minute Post BP 138/62 140/78      Interval HR   1 Minute HR 90 88    2 Minute HR 91 98    3 Minute HR 93 98    4 Minute HR 99 95    5 Minute HR 99 102    6 Minute HR 102 109    2 Minute Post HR 86 91    Interval Heart Rate? Yes Yes      Interval Oxygen    Interval Oxygen ? Yes Yes    Baseline Oxygen  Saturation % 93 % 99 %    1 Minute Oxygen  Saturation % 97 % 99 %    1 Minute Liters of Oxygen  2 L 2 L    2 Minute Oxygen  Saturation % 97 % 97 %    2 Minute Liters of Oxygen  2 L 2 L    3 Minute Oxygen  Saturation %  90 % 96 %    3 Minute Liters of Oxygen  2 L 0 L    4 Minute Oxygen  Saturation % 91 % 94 %    4 Minute Liters of Oxygen  2 L 0 L    5 Minute Oxygen  Saturation % 92 % 93 %    5 Minute Liters of Oxygen  2 L 2 L    6 Minute Oxygen  Saturation % 96 % 93 %    6 Minute Liters of Oxygen  2 L 2 L    2 Minute Post Oxygen  Saturation % 98 % 97 %    2 Minute Post Liters of Oxygen  2 L 2 L        Dr. Oneil Pinal is Medical Director for [facility] Cardiac Rehabilitation.  Dr. Fuad Aleskerov is Medical Director for [facility] Pulmonary Rehabilitation.     [1]  Social History Tobacco Use  Smoking Status Former   Current packs/day: 0.00   Average packs/day: 1.5 packs/day for 52.5 years (78.8 ttl pk-yrs)   Types: Cigarettes   Start date: 05/10/1941   Quit date: 11/09/1993   Years since quitting: 30.2  Smokeless Tobacco Never

## 2024-01-17 ENCOUNTER — Encounter: Admitting: *Deleted

## 2024-01-17 DIAGNOSIS — J449 Chronic obstructive pulmonary disease, unspecified: Secondary | ICD-10-CM

## 2024-01-17 NOTE — Progress Notes (Signed)
 Daily Session Note  Patient Details  Name: Tanya Rhodes MRN: [id number] Date of Birth: 1940/08/06 Referring Provider:   Flowsheet Row Pulmonary Rehab from 10/09/2023 in [facility] Cardiac and Pulmonary Rehab  Referring Provider Dr. Lavelle Servant    Encounter Date: 01/17/2024  Check In:  Session Check In - 01/17/24 1445       Check-In   Supervising physician immediately available to respond to emergencies See telemetry face sheet for immediately available ER MD    Location [facility]-Cardiac & Pulmonary Rehab    Staff Present Othel Durand, RN, BSN, CCRP;Meredith Tressa RN,BSN;Joseph Hood RCP,RRT,BSRT;Noah Tickle, [place], Exercise Physiologist    Virtual Visit No    Medication changes reported     No    Fall or balance concerns reported    No    Warm-up and Cool-down Performed on first and last piece of equipment    Resistance Training Performed Yes    VAD Patient? No    PAD/SET Patient? No      Pain Assessment   Currently in Pain? No/denies             Tobacco Use History[1]  Goals Met:  Proper associated with RPD/PD & O2 Sat Exercise tolerated well No report of concerns or symptoms today  Goals Unmet:  Not Applicable  Comments: Pt able to follow exercise prescription today without complaint.  Will continue to monitor for progression.    Dr. Oneil Pinal is Medical Director for [facility] Cardiac Rehabilitation.  Dr. Fuad Aleskerov is Medical Director for [facility] Pulmonary Rehabilitation.    [1]  Social History Tobacco Use  Smoking Status Former   Current packs/day: 0.00   Average packs/day: 1.5 packs/day for 52.5 years (78.8 ttl pk-yrs)   Types: Cigarettes   Start date: 05/10/1941   Quit date: 11/09/1993   Years since quitting: 30.2  Smokeless Tobacco Never

## 2024-01-21 ENCOUNTER — Encounter: Admitting: Emergency Medicine

## 2024-01-21 DIAGNOSIS — J449 Chronic obstructive pulmonary disease, unspecified: Secondary | ICD-10-CM

## 2024-01-21 NOTE — Progress Notes (Signed)
 Daily Session Note  Patient Details  Name: Tanya Rhodes MRN: [id number] Date of Birth: [birth year] Referring Provider:   Flowsheet Row Pulmonary Rehab from 10/09/2023 in [facility] Cardiac and Pulmonary Rehab  Referring Provider Dr. Lavelle Servant    Encounter Date: 01/21/2024  Check In:  Session Check In - 01/21/24 1359       Check-In   Supervising physician immediately available to respond to emergencies See telemetry face sheet for immediately available ER MD    Location [facility]-Cardiac & Pulmonary Rehab    Staff Present Leita Franks RN,BSN;Maxon Burnell BS, Exercise Physiologist;Margaret Best, MS, Exercise Physiologist;Meredith Tressa RN,BSN    Virtual Visit No    Medication changes reported     No    Fall or balance concerns reported    No    Tobacco Cessation No Change    Warm-up and Cool-down Performed on first and last piece of equipment    Resistance Training Performed Yes    VAD Patient? No    PAD/SET Patient? No      Pain Assessment   Currently in Pain? No/denies             Tobacco Use History[1]  Goals Met:  Proper associated with RPD/PD & O2 Sat Independence with exercise equipment Using PLB without cueing & demonstrates good technique Exercise tolerated well No report of concerns or symptoms today Strength training completed today  Goals Unmet:  Not Applicable  Comments: Pt able to follow exercise prescription today without complaint.  Will continue to monitor for progression.    Dr. Oneil Pinal is Medical Director for [facility] Cardiac Rehabilitation.  Dr. Fuad Aleskerov is Medical Director for [facility] Pulmonary Rehabilitation.    [1]  Social History Tobacco Use  Smoking Status Former   Current packs/day: 0.00   Average packs/day: 1.5 packs/day for 52.5 years (78.8 ttl pk-yrs)   Types: Cigarettes   Start date: 05/10/1941   Quit date: 11/09/1993   Years since quitting: 30.2  Smokeless Tobacco Never

## 2024-01-23 ENCOUNTER — Encounter

## 2024-01-23 ENCOUNTER — Encounter: Payer: Self-pay | Admitting: *Deleted

## 2024-01-23 DIAGNOSIS — J449 Chronic obstructive pulmonary disease, unspecified: Secondary | ICD-10-CM

## 2024-01-23 NOTE — Progress Notes (Signed)
 Pulmonary Individual Treatment Plan  Patient Details  Name: Tanya Rhodes MRN: [id number] Date of Birth: March 06, 1940 Referring Provider:   Flowsheet Row Pulmonary Rehab from 10/09/2023 in [facility] Cardiac and Pulmonary Rehab  Referring Provider Dr. 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   a.) CNB 07/21/2022 --> pathology (+) for ER/PR + ADH   Centrilobular emphysema (HCC)    Chronic systolic CHF (congestive heart failure) (HCC)    a.) TTE 03/31/2015: EF 20-25%, diffuse HK, mod LAE, sev LVE, mild RAE/RVE, mod TR; b.) MV 05/14/2015: EF 32%; c.) TTE 12/12/2016: EF 45%, no RWMAs, norm RVSF, mild MR/TR; d.) TTE 08/24/2022: EF 40%, sep HK, G1DD, norm RVSF, triv AR/TR, mild MR; e.) MV 08/24/2022: EF 49%   Colon polyposis    a.) s/p partial colectomy 2013   COPD (chronic obstructive pulmonary disease) (HCC)    Coronary artery disease involving native coronary artery of native heart without angina pectoris    a.) LHC 07/06/2008: 20% mLAD, 95% pRCA (3.5 x 18 mm Vision BMS); b.) MV 05/14/2015: no ischemia; c.) MV 08/24/2022: small reversible perfusion abnormality in the inferior region consistent with mild inferior ischemia.   Cortical senile cataract    DDD (degenerative disc disease), lumbar    Depression    Dilated cardiomyopathy (HCC)    a.) TTE 03/31/2015: EF 20-25%; b.) MV 05/14/2015: EF 32%; c.) TTE 12/12/2016: EF 45%; d.) TTE 08/24/2022: EF 40%; e.) MV  08/24/2022: EF 49%   Dyspnea    Former smoker    GERD (gastroesophageal reflux disease)    HLD (hyperlipidemia)    Hypertension    IDA (iron  deficiency anemia)    Internal hemorrhoids    LBBB (left bundle branch block)    Levoscoliosis of thoracic spine    Low serum vitamin D     Lumbar radiculitis    Lumbar spondylitis    Lumbar stenosis with neurogenic claudication    Melena    Osteoarthritis    PUD (peptic ulcer disease)    Pulmonary nodule    Restless leg    Status post left rotator cuff repair    Supplemental oxygen  dependent    a.) 2L/[place] continuous   Unintentional weight loss    Vitamin B12 deficiency     Tobacco Use: Tobacco Use History[2]  Labs: Review Flowsheet       Latest Ref Rng & Units 10/27/2013  Labs for ITP Cardiac and Pulmonary Rehab  Hemoglobin A1c 4.2 - 6.3 % 5.9      Pulmonary Assessment Scores:  Pulmonary Assessment Scores     Row Name 10/09/23 1341 01/16/24 1412 01/21/24 1412     ADL UCSD   ADL Phase -- Exit --   SOB Score total -- -- 71   Rest -- -- 0   Walk -- -- 4   Stairs -- -- 5   Bath -- -- 0   Dress -- -- 0   Shop -- -- 0  CAT Score   CAT Score 20 -- 23     mMRC Score   mMRC Score -- 1 --      UCSD: Self-administered rating of dyspnea associated with activities of daily living (ADLs) 6-point scale (0 = not at all to 5 = maximal or unable to do because of breathlessness)  Scoring Scores range from 0 to 120.  Minimally important difference is 5 units  CAT: CAT can identify the health impairment of COPD patients and is better correlated with disease progression.  CAT has a scoring range of zero to 40. The CAT score is classified into four groups of low (less than 10), medium (10 - 20), high (21-30) and very high (31-40) based on the impact level of disease on health status. A CAT score over 10 suggests significant symptoms.  A worsening CAT score could be explained by an exacerbation, poor medication adherence, poor  inhaler technique, or progression of COPD or comorbid conditions.  CAT MCID is 2 points  mMRC: mMRC (Modified Medical Research Council) Dyspnea Scale is used to assess the degree of baseline functional disability in patients of respiratory disease due to dyspnea. No minimal important difference is established. A decrease in score of 1 point or greater is considered a positive change.   Pulmonary Function Assessment:  Pulmonary Function Assessment - 10/03/23 1423       Pulmonary Function Tests   FEV1% 51 %    FEV1/FVC Ratio 45      Breath   Shortness of Breath Yes;Limiting activity          Exercise Target Goals: Exercise Program Goal: Individual exercise prescription set using results from initial 6 min walk test and THRR while considering  patients activity barriers and safety.   Exercise Prescription Goal: Initial exercise prescription builds to 30-45 minutes a day of aerobic activity, 2-3 days per week.  Home exercise guidelines will be given to patient during program as part of exercise prescription that the participant will acknowledge.  Education: Aerobic Exercise: - Group verbal and visual presentation on the components of exercise prescription. Introduces F.I.T.T principle from ACSM for exercise prescriptions.  Reviews F.I.T.T. principles of aerobic exercise including progression. Written material provided at class time. Flowsheet Row Pulmonary Rehab from 01/09/2024 in [facility] Cardiac and Pulmonary Rehab  Date 10/24/23  Educator mb  Instruction Review Code 1- Verbalizes Understanding    Education: Resistance Exercise: - Group verbal and visual presentation on the components of exercise prescription. Introduces F.I.T.T principle from ACSM for exercise prescriptions  Reviews F.I.T.T. principles of resistance exercise including progression. Written material provided at class time. Flowsheet Row Pulmonary Rehab from 01/09/2024 in [facility] Cardiac and Pulmonary Rehab  Date  12/26/23  Educator kh  Instruction Review Code 1- Verbalizes Understanding     Education: Exercise & Equipment Safety: - Individual verbal instruction and demonstration of equipment use and safety with use of the equipment. Flowsheet Row Pulmonary Rehab from 01/09/2024 in [facility] Cardiac and Pulmonary Rehab  Date 10/09/23  Educator [facility]  Instruction Review Code 1- Verbalizes Understanding    Education: Exercise Physiology & General Exercise Guidelines: - Group verbal and written instruction with models to review the exercise physiology of the cardiovascular system and associated critical values. Provides general exercise guidelines with specific guidelines to those with heart or lung disease.  Flowsheet Row Pulmonary Rehab from 01/09/2024 in [facility] Cardiac and Pulmonary Rehab  Date 12/19/23  Educator kh  Instruction Review Code 1- [employer] Understanding    Education: Flexibility,  Balance, Mind/Body Relaxation: - Group verbal and visual presentation with interactive activity on the components of exercise prescription. Introduces F.I.T.T principle from ACSM for exercise prescriptions. Reviews F.I.T.T. principles of flexibility and balance exercise training including progression. Also discusses the mind body connection.  Reviews various relaxation techniques to help reduce and manage stress (i.e. Deep breathing, progressive muscle relaxation, and visualization). Balance handout provided to take home. Written material provided at class time. Flowsheet Row Pulmonary Rehab from 01/09/2024 in [facility] Cardiac and Pulmonary Rehab  Date 12/26/23  Educator [facility]  Instruction Review Code 1- Verbalizes Understanding    Activity Barriers & Risk Stratification:  Activity Barriers & Cardiac Risk Stratification - 10/09/23 1516       Activity Barriers & Cardiac Risk Stratification   Activity Barriers Shortness of Breath;History of Falls          6 Minute Walk:  6 Minute Walk     Row Name 10/09/23 1513  01/16/24 1407       6 Minute Walk   Phase Initial Discharge    Distance 770 feet 900 feet    Distance % Change -- 17 %    Distance Feet Change -- 130 ft    Walk Time 6 minutes 6 minutes    # of Rest Breaks 0 0    MPH 1.45 1.7    METS 1.61 2    RPE 13 13    Perceived Dyspnea  3 1    VO2 Peak 5.64 6.83    Symptoms No No    Resting HR 91 bpm 80 bpm    Resting BP 146/62 128/80    Resting Oxygen  Saturation  93 % 99 %    Exercise Oxygen  Saturation  during 6 min walk 90 % 93 %    Max Ex. HR 102 bpm 109 bpm    Max Ex. BP 142/68 152/78    2 Minute Post BP 138/62 140/78      Interval HR   1 Minute HR 90 88    2 Minute HR 91 98    3 Minute HR 93 98    4 Minute HR 99 95    5 Minute HR 99 102    6 Minute HR 102 109    2 Minute Post HR 86 91    Interval Heart Rate? Yes Yes      Interval Oxygen    Interval Oxygen ? Yes Yes    Baseline Oxygen  Saturation % 93 % 99 %    1 Minute Oxygen  Saturation % 97 % 99 %    1 Minute Liters of Oxygen  2 L 2 L    2 Minute Oxygen  Saturation % 97 % 97 %    2 Minute Liters of Oxygen  2 L 2 L    3 Minute Oxygen  Saturation % 90 % 96 %    3 Minute Liters of Oxygen  2 L 0 L    4 Minute Oxygen  Saturation % 91 % 94 %    4 Minute Liters of Oxygen  2 L 0 L    5 Minute Oxygen  Saturation % 92 % 93 %    5 Minute Liters of Oxygen  2 L 2 L    6 Minute Oxygen  Saturation % 96 % 93 %    6 Minute Liters of Oxygen  2 L 2 L    2 Minute Post Oxygen  Saturation % 98 % 97 %    2 Minute Post Liters of Oxygen  2 L 2 L  Oxygen  Initial Assessment:  Oxygen  Initial Assessment - 10/03/23 1422       Home Oxygen    Home Oxygen  Device Home Concentrator;Portable Concentrator;E-Tanks    Sleep Oxygen  Prescription Continuous    Liters per minute 2    Home Exercise Oxygen  Prescription Continuous    Liters per minute 2    Home Resting Oxygen  Prescription Continuous    Liters per minute 2    Compliance with Home Oxygen  Use Yes      Initial 6 min Walk   Oxygen  Used Continuous     Liters per minute 2      Program Oxygen  Prescription   Program Oxygen  Prescription Continuous    Liters per minute 2      Intervention   Short Term Goals To learn and exhibit compliance with exercise, home and travel O2 prescription;To learn and understand importance of monitoring SPO2 with pulse oximeter and demonstrate accurate use of the pulse oximeter.;To learn and demonstrate proper pursed lip breathing techniques or other breathing techniques. ;To learn and understand importance of maintaining oxygen  saturations>88%;To learn and demonstrate proper use of respiratory medications    Long  Term Goals Exhibits compliance with exercise, home  and travel O2 prescription;Maintenance of O2 saturations>88%;Compliance with respiratory medication;Demonstrates proper use of MDIs;Exhibits proper breathing techniques, such as pursed lip breathing or other method taught during program session;Verbalizes importance of monitoring SPO2 with pulse oximeter and return demonstration          Oxygen  Re-Evaluation:  Oxygen  Re-Evaluation     Row Name 10/15/23 1413 10/25/23 1423 11/15/23 1406 12/19/23 1550 01/21/24 1417     Program Oxygen  Prescription   Program Oxygen  Prescription -- Continuous;E-Tanks Continuous;E-Tanks Continuous;E-Tanks Continuous;E-Tanks   Liters per minute -- 2 2 2 2      Home Oxygen    Home Oxygen  Device -- Home Concentrator;Portable Concentrator;E-Tanks Home Concentrator;Portable Concentrator;E-Tanks Home Concentrator;Portable Concentrator;E-Tanks Home Concentrator;Portable Concentrator;E-Tanks   Sleep Oxygen  Prescription -- Continuous Continuous Continuous Continuous   Liters per minute -- 2 2 2 2    Home Exercise Oxygen  Prescription -- Continuous Continuous Continuous Continuous   Liters per minute -- 2 2 2 2    Home Resting Oxygen  Prescription -- Continuous Continuous Continuous Continuous   Liters per minute -- 2 2 2 2    Compliance with Home Oxygen  Use -- Yes Yes -- Yes      Goals/Expected Outcomes   Short Term Goals -- To learn and demonstrate proper pursed lip breathing techniques or other breathing techniques.  To learn and exhibit compliance with exercise, home and travel O2 prescription To learn and understand importance of monitoring SPO2 with pulse oximeter and demonstrate accurate use of the pulse oximeter.;To learn and understand importance of maintaining oxygen  saturations>88%;To learn and demonstrate proper pursed lip breathing techniques or other breathing techniques. ;To learn and demonstrate proper use of respiratory medications To learn and understand importance of monitoring SPO2 with pulse oximeter and demonstrate accurate use of the pulse oximeter.;Other  Patient understands how to use a pulse ox but has yet to get one for home use. Patient was encouraged to look on Amazon or elsewhere for an affordable unit as the ones at [facility] she said are cost prohibitive.   Long  Term Goals -- Exhibits proper breathing techniques, such as pursed lip breathing or other method taught during program session Exhibits compliance with exercise, home  and travel O2 prescription Maintenance of O2 saturations>88%;Compliance with respiratory medication;Exhibits proper breathing techniques, such as pursed lip breathing or other method taught during program  session;Verbalizes importance of monitoring SPO2 with pulse oximeter and return demonstration Maintenance of O2 saturations>88%;Compliance with respiratory medication   Comments Reviewed PLB technique with pt.  Talked about how it works and it's importance in maintaining their exercise saturations. Informed patient how to perform the Pursed Lipped breathing technique. Told patient to Inhale through the nose and out the mouth with pursed lips to keep their airways open, help oxygenate them better, practice when at rest or doing strenuous activity. Patient Verbalizes understanding of technique and will work on and be reiterated during  [employer]. Haset wanted staff to look at her new portable pulse oximeter. The information on the websit showed 3 liters and informed patient how to work the timer on the machine. Her oxygen  on 2 liters keeps her oxygen  saturation up when she is doing exercise. Informed her that if her oxygen  stays above 88 percent with her portable concentraor on she can use it if she is comfortable. Peja stated that she didn't think she has a pulse oximeter. She was encouraged to get on eif she does not have one and start monitoring Sa02 at home, maintaining it at 88% or above. PLB techniques were reviewed and verbalized understanding of how to use PLB to control SOB. She reports that she takes all respiratory medications. Emme stated that she does not have a pulse oximeter and was encouraged to purchase one if affordable. Patient was encouraged to contact her health insurance company as sometimes they will help provide monitoring equipment. Aviyanna stated she will look on Amazon to see if there is one she can afford and purchase it. Benigna also stated that she has a new O2 tank that seems to have better pressure/flow. Overall, SOB has not improved/worsened.   Goals/Expected Outcomes Short: Become more profiecient at using PLB.   Long: Become independent at using PLB. Short: use PLB with exertion. Long: use PLB on exertion proficiently and independently. Short: check and use oxygen  as needed. Long: maintain oxygen  saturation and machine independently. Short: buy a pulse ox and start monitoring at home. Long: maitain oxygen  saturations and become independent with PLB. ST: Shawntay will contact insurance company to see if any assistance in securing a pulse ox/will look on Amazon for an affordable pulse ox. No recognizable barriers for upcoming discharge. LT: Maintain oxygen  saturations and continue independence in PLB. Lauralie will reach out to MD as needed as respiratory concerns arise.      Oxygen  Discharge (Final  Oxygen  Re-Evaluation):  Oxygen  Re-Evaluation - 01/21/24 1417       Program Oxygen  Prescription   Program Oxygen  Prescription Continuous;E-Tanks    Liters per minute 2      Home Oxygen    Home Oxygen  Device Home Concentrator;Portable Concentrator;E-Tanks    Sleep Oxygen  Prescription Continuous    Liters per minute 2    Home Exercise Oxygen  Prescription Continuous    Liters per minute 2    Home Resting Oxygen  Prescription Continuous    Liters per minute 2    Compliance with Home Oxygen  Use Yes      Goals/Expected Outcomes   Short Term Goals To learn and understand importance of monitoring SPO2 with pulse oximeter and demonstrate accurate use of the pulse oximeter.;Other   Patient understands how to use a pulse ox but has yet to get one for home use. Patient was encouraged to look on Amazon or elsewhere for an affordable unit as the ones at [facility] she said are cost prohibitive.   Long  Term Goals Maintenance  of O2 saturations>88%;Compliance with respiratory medication    Comments Leighana stated that she does not have a pulse oximeter and was encouraged to purchase one if affordable. Patient was encouraged to contact her health insurance company as sometimes they will help provide monitoring equipment. Nikaela stated she will look on Amazon to see if there is one she can afford and purchase it. Shaquitta also stated that she has a new O2 tank that seems to have better pressure/flow. Overall, SOB has not improved/worsened.    Goals/Expected Outcomes ST: Elva will applied materials company to see if any assistance in securing a pulse ox/will look on Amazon for an affordable pulse ox. No recognizable barriers for upcoming discharge. LT: Maintain oxygen  saturations and continue independence in PLB. Emmer will reach out to MD as needed as respiratory concerns arise.          Initial Exercise Prescription:  Initial Exercise Prescription - 10/09/23 1500       Date of Initial Exercise RX and  Referring Provider   Date 10/09/23    Referring Provider Dr. Lavelle Servant      Oxygen    Oxygen  Continuous    Liters 2L    Maintain Oxygen  Saturation 88% or higher      Recumbant Bike   Level 2    RPM 50    Watts 25    Minutes 15    METs 1.61      NuStep   Level 2    SPM 80    Minutes 15    METs 1.61      REL-XR   Level 2    Watts 25    Speed 50    Minutes 15    METs 1.61      T5 Nustep   Level 2    SPM 80    Minutes 15    METs 1.61      Biostep-RELP   Level 2    SPM 50    Minutes 15    METs 1.61      Track   Laps 20    Minutes 15    METs 2.09      Prescription Details   Duration Progress to 30 minutes of continuous aerobic without signs/symptoms of physical distress      Intensity   THRR 40-80% of Max Heartrate 109-127    Ratings of Perceived Exertion 11-13    Perceived Dyspnea 0-4      Progression   Progression Continue to progress workloads to maintain intensity without signs/symptoms of physical distress.      Resistance Training   Training Prescription Yes    Weight 1lb    Reps 10-15          Perform Capillary Blood Glucose checks as needed.  Exercise Prescription Changes:   Exercise Prescription Changes     Row Name 10/09/23 1500 10/25/23 0800 11/05/23 1100 11/08/23 1400 11/22/23 1500     Response to Exercise   Blood Pressure (Admit) 146/62 132/62 132/78 -- 118/60   Blood Pressure (Exercise) 142/68 154/68 162/78 -- 152/68   Blood Pressure (Exit) 138/62 132/64 128/64 -- 110/60   Heart Rate (Admit) 91 bpm 86 bpm 83 bpm -- 92 bpm   Heart Rate (Exercise) 102 bpm 101 bpm 109 bpm -- 110 bpm   Heart Rate (Exit) 86 bpm 93 bpm 79 bpm -- 73 bpm   Oxygen  Saturation (Admit) 93 % 98 % 98 % -- 98 %   Oxygen  Saturation (Exercise)  90 % 95 % 93 % -- 93 %   Oxygen  Saturation (Exit) 98 % 98 % 98 % -- 97 %   Rating of Perceived Exertion (Exercise) 13 15 15  -- 15   Perceived Dyspnea (Exercise) 3 3 4  -- 3   Symptoms none none none -- none    Comments results first 2 weeks of exercise -- -- --   Duration -- Progress to 30 minutes of  aerobic without signs/symptoms of physical distress Progress to 30 minutes of  aerobic without signs/symptoms of physical distress -- Progress to 30 minutes of  aerobic without signs/symptoms of physical distress   Intensity -- THRR unchanged THRR unchanged -- THRR unchanged     Progression   Progression -- Continue to progress workloads to maintain intensity without signs/symptoms of physical distress. Continue to progress workloads to maintain intensity without signs/symptoms of physical distress. -- Continue to progress workloads to maintain intensity without signs/symptoms of physical distress.   Average METs -- 2.1 2.33 -- 2.32     Resistance Training   Training Prescription -- Yes Yes -- Yes   Weight -- 1lb 1lb -- 2 lb   Reps -- 10-15 10-15 -- 10-15     Interval Training   Interval Training -- No No -- No     Oxygen    Oxygen  -- Continuous Continuous -- Continuous   Liters -- 2L 2 -- 2     Recumbant Bike   Level -- 1 2 -- 2   Watts -- 13 13 -- 19   Minutes -- 15 15 -- 15   METs -- 2.87 2.86 -- 2.99     NuStep   Level -- 1 2 -- 4   Minutes -- 15 15 -- 15   METs -- -- 3 -- 2.8     T5 Nustep   Level -- 2 1 -- 1   Minutes -- 15 15 -- 15   METs -- 2 1.9 -- 1.8     Biostep-RELP   Level -- 2 1 -- --   Minutes -- 15 15 -- --   METs -- 2 2 -- --     Track   Laps -- 8  Hall 26 -- 26   Minutes -- 15 15 -- 15   METs -- 1.44 2.41 -- 2.41     Home Exercise Plan   Plans to continue exercise at -- -- -- Home (comment)  Noraa currently is walking around her house as well as using hand weights on days she does not attend the program. Home (comment)  Countess currently is walking around her house as well as using hand weights on days she does not attend the program.   Frequency -- -- -- Add 2 additional days to program exercise sessions. Add 2 additional days to program exercise  sessions.   Initial Home Exercises Provided -- -- -- 11/08/23 11/08/23     Oxygen    Maintain Oxygen  Saturation -- 88% or higher 88% or higher -- 88% or higher    Row Name 12/10/23 0800 12/19/23 1100 01/07/24 0800         Response to Exercise   Blood Pressure (Admit) 134/70 134/82 118/54     Blood Pressure (Exit) 110/62 124/60 140/64     Heart Rate (Admit) 80 bpm 86 bpm 86 bpm     Heart Rate (Exercise) 120 bpm 110 bpm 103 bpm     Heart Rate (Exit) 85 bpm 95 bpm 87 bpm  Oxygen  Saturation (Admit) 98 % 98 % 99 %     Oxygen  Saturation (Exercise) 92 % 94 % 95 %     Oxygen  Saturation (Exit) 98 % 97 % 96 %     Rating of Perceived Exertion (Exercise) 15 15 15      Perceived Dyspnea (Exercise) 2 2 2      Symptoms none none none     Duration Progress to 30 minutes of  aerobic without signs/symptoms of physical distress Progress to 30 minutes of  aerobic without signs/symptoms of physical distress Progress to 30 minutes of  aerobic without signs/symptoms of physical distress     Intensity THRR unchanged THRR unchanged THRR unchanged       Progression   Progression Continue to progress workloads to maintain intensity without signs/symptoms of physical distress. Continue to progress workloads to maintain intensity without signs/symptoms of physical distress. Continue to progress workloads to maintain intensity without signs/symptoms of physical distress.     Average METs 2.5 2.46 2.4       Resistance Training   Training Prescription Yes Yes --     Weight 2 lb 2 lb 2 lb     Reps 10-15 10-15 10-15       Interval Training   Interval Training No No No       Oxygen    Oxygen  Continuous Continuous Continuous     Liters 2 2 2        Recumbant Bike   Level 2 -- 2     Watts 16 -- 18     Minutes 15 -- 15     METs 3.25 -- 3.2       NuStep   Level 3 2 2      Minutes 15 15 15      METs 2.3 2.8 2.8       T5 Nustep   Level 1 -- 2     Minutes 15 -- 15     METs 1.9 -- 1.9       Biostep-RELP    Level -- -- 1     Minutes -- -- 15     METs -- -- 2       Track   Laps 30 30 25      Minutes 15 15 15      METs 2.63 2.63 2.36       Home Exercise Plan   Plans to continue exercise at Home (comment)  Shannyn currently is walking around her house as well as using hand weights on days she does not attend the program. Home (comment)  Lylah currently is walking around her house as well as using hand weights on days she does not attend the program. Home (comment)  Enolia currently is walking around her house as well as using hand weights on days she does not attend the program.     Frequency Add 2 additional days to program exercise sessions. Add 2 additional days to program exercise sessions. Add 2 additional days to program exercise sessions.     Initial Home Exercises Provided 11/08/23 11/08/23 11/08/23       Oxygen    Maintain Oxygen  Saturation 88% or higher 88% or higher 88% or higher        Exercise Comments:   Exercise Comments     Row Name 10/15/23 1412           Exercise Comments First full day of exercise!  Patient was oriented to gym and equipment including functions, settings, policies, and procedures.  Patient's individual exercise prescription and treatment plan were reviewed.  All starting workloads were established based on the results of the 6 minute walk test done at initial orientation visit.  The plan for exercise progression was also introduced and progression will be customized based on patient's performance and goals.          Exercise Goals and Review:   Exercise Goals     Row Name 10/09/23 1520             Exercise Goals   Increase Physical Activity Yes       Intervention Provide advice, education, support and counseling about physical activity/exercise needs.;Develop an individualized exercise prescription for aerobic and resistive training based on initial evaluation findings, risk stratification, comorbidities and participant's personal goals.        Expected Outcomes Short Term: Attend rehab on a regular basis to increase amount of physical activity.;Long Term: Add in home exercise to make exercise part of routine and to increase amount of physical activity.;Long Term: Exercising regularly at least 3-5 days a week.       Increase Strength and Stamina Yes       Intervention Provide advice, education, support and counseling about physical activity/exercise needs.;Develop an individualized exercise prescription for aerobic and resistive training based on initial evaluation findings, risk stratification, comorbidities and participant's personal goals.       Expected Outcomes Short Term: Increase workloads from initial exercise prescription for resistance, speed, and METs.;Short Term: Perform resistance training exercises routinely during rehab and add in resistance training at home;Long Term: Improve cardiorespiratory fitness, muscular endurance and strength as measured by increased METs and functional capacity ( )       Able to understand and use rate of perceived exertion (RPE) scale Yes       Intervention Provide education and explanation on how to use RPE scale       Expected Outcomes Short Term: Able to use RPE daily in rehab to express subjective intensity level;Long Term:  Able to use RPE to guide intensity level when exercising independently       Able to understand and use Dyspnea scale Yes       Intervention Provide education and explanation on how to use Dyspnea scale       Expected Outcomes Short Term: Able to use Dyspnea scale daily in rehab to express subjective sense of shortness of breath during exertion;Long Term: Able to use Dyspnea scale to guide intensity level when exercising independently       Knowledge and understanding of Target Heart Rate Range (THRR) Yes       Intervention Provide education and explanation of THRR including how the numbers were predicted and where they are located for reference       Expected Outcomes Short  Term: Able to state/look up THRR;Short Term: Able to use daily as guideline for intensity in rehab;Long Term: Able to use THRR to govern intensity when exercising independently       Able to check pulse independently Yes       Intervention Provide education and demonstration on how to check pulse in carotid and radial arteries.;Review the importance of being able to check your own pulse for safety during independent exercise       Expected Outcomes Long Term: Able to check pulse independently and accurately;Short Term: Able to explain why pulse checking is important during independent exercise       Understanding of Exercise Prescription Yes  Intervention Provide education, explanation, and written materials on patient's individual exercise prescription       Expected Outcomes Short Term: Able to explain program exercise prescription;Long Term: Able to explain home exercise prescription to exercise independently          Exercise Goals Re-Evaluation :  Exercise Goals Re-Evaluation     Row Name 10/15/23 1412 10/25/23 0841 11/05/23 1138 11/08/23 1433 11/22/23 1512     Exercise Goal Re-Evaluation   Exercise Goals Review Increase Physical Activity;Able to understand and use rate of perceived exertion (RPE) scale;Knowledge and understanding of Target Heart Rate Range (THRR);Understanding of Exercise Prescription;Able to check pulse independently;Increase Strength and Stamina;Able to understand and use Dyspnea scale Increase Physical Activity;Increase Strength and Stamina;Understanding of Exercise Prescription Increase Physical Activity;Increase Strength and Stamina;Understanding of Exercise Prescription Increase Physical Activity;Able to understand and use Dyspnea scale;Understanding of Exercise Prescription;Knowledge and understanding of Target Heart Rate Range (THRR);Increase Strength and Stamina;Able to understand and use rate of perceived exertion (RPE) scale;Able to check pulse independently  Increase Physical Activity;Understanding of Exercise Prescription;Increase Strength and Stamina   Comments Reviewed RPE and dyspnea scale, THR and program prescription with pt today.  Pt voiced understanding and was given a copy of goals to take home. Kamilya is off to a good start in the program, and was able to attend her first few sessions during this review period. During these sessions she was able to use the T5 nustep, and biostep at level 2, as well as walk 8 laps along the hallway. We will continue to monitor her progress in the program. Zaraya is doing well in rehab. She was recently able to increase from 8 to 26 track laps in 20 minutes. She was also able to increase to level 2 on both the T4 nustep and recumbent bike. We will continue to monitor her progress in the program. Reviewed home exercise with pt today from 2:00 to 2:15.  Pt plans to continue to use hand weight exercises as well as walking around her house for exercise.  Reviewed THR, pulse, RPE, sign and symptoms, pulse oximetery and when to call 911 or MD.  Also discussed weather considerations and indoor options.  Pt voiced understanding. Mersedes continues to do well in rehab. She increased to level 4 on the T4 nustep and increased to 2lb handweights. She maintained level 2 on the recumbent bike, level 1 on the T5 nustep, and 26 laps on the track. We will continue to monitor her progress in the program.   Expected Outcomes Short: Use RPE daily to regulate intensity.  Long: Follow program prescription in THR. Short: Continue to follow exercise prescription. Long: Continue exercise to improve strength and stamina. Short: Continue to follow exercise prescription, increase workloads when able. Long: Continue exercise to improve strength and stamina. Short: Continue to exercise at home as well as in the program. Long: Continue exercise plan to improve strength and stamina. Short: Try level 3 on the recumbent bike. Long: Continue exercise to  improve strength and stamina.    Row Name 12/10/23 9157 12/10/23 1508 12/19/23 1149 01/07/24 0839 01/21/24 1531     Exercise Goal Re-Evaluation   Exercise Goals Review Increase Physical Activity;Understanding of Exercise Prescription;Increase Strength and Stamina Increase Physical Activity;Understanding of Exercise Prescription;Increase Strength and Stamina Increase Physical Activity;Understanding of Exercise Prescription;Increase Strength and Stamina Increase Physical Activity;Understanding of Exercise Prescription;Increase Strength and Stamina Increase Physical Activity;Increase Strength and Stamina   Comments Jonathon is doing well in rehab. She was able to increase  her laps on the track from 26 to 30, and was able to maintain level 2 on the recumbent bike. We will continue to monitor her progress in the program. Adonna is doing well with her home exercise. She is doing weights at home with a little bit of resistance in 2 water  bottles. She is also walking regularly outside for her home exercise. She states that she has more difficulty with the walking as it starts to get colder and we discussed finding an indoor option so that she can continue to walk. Kileen continues to do well in rehab. She was able to maintain walking 30 laps on the track. She worked at level 2 on the T4 nustep. We will continue to monitor her progress in the program. Damisha is doing well in rehab. She was recently able to increase from level 1 to 2 on the T5 nustep. She was also able to walk 25 laps on the track. We will continue to monitor her progress in the program. India will be graduating rehab soon and has 3 visits left to complete. She understands how to monitor her oxygen  and when to take breaks to maximize her exercise sessions. She enjoys rehab and wants to continue getting out on a scheduled basis. She was encouraged to explore opportunities at the [facility] and information was provided.   Expected Outcomes Short:  Try to increase to level 2 on the T4 nustep. Long: Continue exercise to improve strength and stamina. Short: Continue home exercise routine and find an indoor walking option. Long: Continue to exercise independently. Short: Increase to level 3 on T4 nustep. Long: Continue to exercise independently. Short: Increase to level 3 on T4 nustep. Long: Continue to exercise independently. ST: Complete remaining sessions of rehab and [facility] LT: Continue exercising and increasing stamina after graduation from rehab.      Discharge Exercise Prescription (Final Exercise Prescription Changes):  Exercise Prescription Changes - 01/07/24 0800       Response to Exercise   Blood Pressure (Admit) 118/54    Blood Pressure (Exit) 140/64    Heart Rate (Admit) 86 bpm    Heart Rate (Exercise) 103 bpm    Heart Rate (Exit) 87 bpm    Oxygen  Saturation (Admit) 99 %    Oxygen  Saturation (Exercise) 95 %    Oxygen  Saturation (Exit) 96 %    Rating of Perceived Exertion (Exercise) 15    Perceived Dyspnea (Exercise) 2    Symptoms none    Duration Progress to 30 minutes of  aerobic without signs/symptoms of physical distress    Intensity THRR unchanged      Progression   Progression Continue to progress workloads to maintain intensity without signs/symptoms of physical distress.    Average METs 2.4      Resistance Training   Weight 2 lb    Reps 10-15      Interval Training   Interval Training No      Oxygen    Oxygen  Continuous    Liters 2      Recumbant Bike   Level 2    Watts 18    Minutes 15    METs 3.2      NuStep   Level 2    Minutes 15    METs 2.8      T5 Nustep   Level 2    Minutes 15    METs 1.9      Biostep-RELP   Level 1  Minutes 15    METs 2      Track   Laps 25    Minutes 15    METs 2.36      Home Exercise Plan   Plans to continue exercise at Home (comment)   Sharone currently is walking around her house as well as using hand weights  on days she does not attend the program.   Frequency Add 2 additional days to program exercise sessions.    Initial Home Exercises Provided 11/08/23      Oxygen    Maintain Oxygen  Saturation 88% or higher          Nutrition:  Target Goals: Understanding of nutrition guidelines, daily intake of sodium 1500mg , cholesterol 200mg , calories 30% from fat and 7% or less from saturated fats, daily to have 5 or more servings of fruits and vegetables.  Education: Nutrition 1 -Group instruction provided by verbal, written material, interactive activities, discussions, models, and posters to present general guidelines for heart healthy nutrition including macronutrients, label reading, and promoting whole foods over processed counterparts. Education serves as pensions consultant of discussion of heart healthy eating for all. Written material provided at class time. Flowsheet Row Pulmonary Rehab from 01/09/2024 in [facility] Cardiac and Pulmonary Rehab  Date 10/31/23  Educator jg  Instruction Review Code 1- Verbalizes Understanding      Education: Nutrition 2 -Group instruction provided by verbal, written material, interactive activities, discussions, models, and posters to present general guidelines for heart healthy nutrition including sodium, cholesterol, and saturated fat. Providing guidance of habit forming to improve blood pressure, cholesterol, and body weight. Written material provided at class time. Flowsheet Row Pulmonary Rehab from 01/09/2024 in [facility] Cardiac and Pulmonary Rehab  Date 11/07/23  Educator jg  Instruction Review Code 1- Verbalizes Understanding      Biometrics:  Pre Biometrics - 10/09/23 1520       Pre Biometrics   Height 5' 0.6 (1.539 m)    Weight 102 lb 11.2 oz (46.6 kg)    Waist Circumference 29 inches    Hip Circumference 35 inches    Waist to Hip Ratio 0.83 %    BMI (Calculated) 19.67    Single Leg Stand 0 seconds          Post Biometrics - 01/16/24 1412         Post  Biometrics   Height 5' 0.6 (1.539 m)    Weight 107 lb 3.2 oz (48.6 kg)    Waist Circumference 31 inches    Hip Circumference 36 inches    Waist to Hip Ratio 0.86 %    BMI (Calculated) 20.53    Single Leg Stand 1.34 seconds          Nutrition Therapy Plan and Nutrition Goals:  Nutrition Therapy & Goals - 01/21/24 1526       Nutrition Therapy   RD appointment deferred Yes          Nutrition Assessments:  MEDIFICTS Score Key: >=70 Need to make dietary changes  40-70 Heart Healthy Diet <= 40 Therapeutic Level Cholesterol Diet  Flowsheet Row Pulmonary Rehab from 01/21/2024 in [facility] Cardiac and Pulmonary Rehab  Picture Your Plate Total Score on Admission 54  Picture Your Plate Total Score on Discharge 62   Picture Your Plate Scores: <59 Unhealthy dietary pattern with much room for improvement. 41-50 Dietary pattern unlikely to meet recommendations for good health and room for improvement. 51-60 More healthful dietary pattern, with some room for improvement.  >60 Healthy  dietary pattern, although there may be some specific behaviors that could be improved.   Nutrition Goals Re-Evaluation:  Nutrition Goals Re-Evaluation     Row Name 10/25/23 1425 11/15/23 1412 12/19/23 1417 01/21/24 1436       Goals   Comment Patient was informed on why it is important to maintain a balanced diet when dealing with Respiratory issues. Explained that it takes a lot of energy to breath and when they are short of breath often they will need to have a good diet to help keep up with the calories they are expending for breathing. Patient would like to meet the RD. Patient stated that she attended the group nutrition lecture and has the information she needs for now and will defer RD apt. Reba states that she has less concerns with her weight as she is gaining and the information provided during nutrition education class was helpful. No further education needed or requested and deferred RD  appointment.    Expected Outcome Short: Choose and plan snacks accordingly to patients caloric intake to improve breathing. Long: Maintain a diet independently that meets their caloric intake to aid in daily shortness of breath. Short: meet with RD. Long: adhere to a diet that pertains to her. -- ST: Continue to use information provided to have healthy weight gain. No recognized barriers for discharge.  LT: Continue to weigh self weekly and monitor diet to ensure healthy intake to increase weight as desired.       Nutrition Goals Discharge (Final Nutrition Goals Re-Evaluation):  Nutrition Goals Re-Evaluation - 01/21/24 1436       Goals   Comment Eiliana states that she has less concerns with her weight as she is gaining and the information provided during nutrition education class was helpful. No further education needed or requested and deferred RD appointment.    Expected Outcome ST: Continue to use information provided to have healthy weight gain. No recognized barriers for discharge.  LT: Continue to weigh self weekly and monitor diet to ensure healthy intake to increase weight as desired.          Psychosocial: Target Goals: Acknowledge presence or absence of significant depression and/or stress, maximize coping skills, provide positive support system. Participant is able to verbalize types and ability to use techniques and skills needed for reducing stress and depression.   Education: Stress, Anxiety, and Depression - Group verbal and visual presentation to define topics covered.  Reviews how body is impacted by stress, anxiety, and depression.  Also discusses healthy ways to reduce stress and to treat/manage anxiety and depression.  Written material provided at class time.   Education: Sleep Hygiene -Provides group verbal and written instruction about how sleep can affect your health.  Define sleep hygiene, discuss sleep cycles and impact of sleep habits. Review good sleep hygiene  tips.    Initial Review & Psychosocial Screening:  Initial Psych Review & Screening - 10/03/23 1426       Initial Review   Current issues with Current Psychotropic Meds;Current Anxiety/Panic      Family Dynamics   Good Support System? Yes    Comments Neenah lives with her son and takes care of her if he needs to. She states she has anxiety at times and does not know where it stems from.      Barriers   Psychosocial barriers to participate in program The patient should benefit from training in stress management and relaxation.;Psychosocial barriers identified (see note)  Screening Interventions   Interventions Provide feedback about the scores to participant;To provide support and resources with identified psychosocial needs;Encouraged to exercise    Expected Outcomes Short Term goal: Utilizing psychosocial counselor, staff and physician to assist with identification of specific Stressors or current issues interfering with healing process. Setting desired goal for each stressor or current issue identified.;Long Term Goal: Stressors or current issues are controlled or eliminated.;Short Term goal: Identification and review with participant of any Quality of Life or Depression concerns found by scoring the questionnaire.;Long Term goal: The participant improves quality of Life and PHQ9 Scores as seen by post scores and/or verbalization of changes          Quality of Life Scores:  Scores of 19 and below usually indicate a poorer quality of life in these areas.  A difference of  2-3 points is a clinically meaningful difference.  A difference of 2-3 points in the total score of the Quality of Life Index has been associated with significant improvement in overall quality of life, self-image, physical symptoms, and general health in studies assessing change in quality of life.  PHQ-9: Review Flowsheet       01/21/2024 10/25/2023 10/09/2023  Depression screen PHQ 2/9  Decreased Interest 0  0 0  Down, Depressed, Hopeless 0 0 2  PHQ - 2 Score 0 0 2  Altered sleeping 0 1 1  Tired, decreased energy 0 1 3  Change in appetite 0 0 0  Feeling bad or failure about yourself  0 0 0  Trouble concentrating 0 1 1  Moving slowly or fidgety/restless 0 0 0  Suicidal thoughts 0 0 0  PHQ-9 Score 0 3  7   Difficult doing work/chores - Somewhat difficult Somewhat difficult    Details       Data saved with a previous flowsheet row definition        Interpretation of Total Score  Total Score Depression Severity:  1-4 = Minimal depression, 5-9 = Mild depression, 10-14 = Moderate depression, 15-19 = Moderately severe depression, 20-27 = Severe depression   Psychosocial Evaluation and Intervention:  Psychosocial Evaluation - 01/21/24 1431       Psychosocial Evaluation & Interventions   Interventions --    Comments --    Expected Outcomes --    Continue Psychosocial Services  --          Psychosocial Re-Evaluation:  Psychosocial Re-Evaluation     Row Name 10/25/23 1432 11/15/23 1424 12/19/23 1426 01/21/24 1434       Psychosocial Re-Evaluation   Current issues with Current Stress Concerns;Current Anxiety/Panic Current Anxiety/Panic Current Anxiety/Panic Current Anxiety/Panic;Current Stress Concerns    Comments Reviewed patient health questionnaire (PHQ-9) with patient for follow up. Previously, patients score indicated signs/symptoms of depression.  Reviewed to see if patient is improving symptom wise while in program.  Score improved and patient states that it is because she has been able to exercise more. Patient states that she has some anxiety. She takes medication for her anxiety. She states that her son has alot of health issues and that he lives with her. Patient states that she does not sleep good at night because of her legs being restless. She does take medicaiton for this. She states no new stress or anxiety aside from missing reletives during the holidays but she is  grateful for the memories she has with them. Haruko lives with her son and he helps take care of her as needed. Anaka states that she  continues to drive and likes to get out of the house for rehab. She states that she continues to take her anxiety medication and that it is working well to manage her stress and anxiety. She also will watch movies to help manage her stress and relax. Terika states she continues to feel safe in her home and is interested in exploring the [employer] for continued outings.    Expected Outcomes Short: Continue to attend [facility] regularly for regular exercise and social engagement. Long: Continue to improve symptoms and manage a positive mental state. Short: attened [facility] to keep stress at a minimum. Long: take medication as prescribed to help mood. Short: attened [facility] to keep stress at a minimum. Long: take medication as prescribed to help mood. ST: No barriers to patient graduating rehab as she continues her medication that is effective for anxiety and stress. LT: Patient will continue to take medications as prescribed and reach out to MD as needed.    Interventions Encouraged to attend Pulmonary Rehabilitation for the exercise Encouraged to attend Pulmonary Rehabilitation for the exercise Encouraged to attend Pulmonary Rehabilitation for the exercise Relaxation education;Stress management education    Continue Psychosocial Services  Follow up required by staff Follow up required by staff Follow up required by staff No Follow up required       Psychosocial Discharge (Final Psychosocial Re-Evaluation):  Psychosocial Re-Evaluation - 01/21/24 1434       Psychosocial Re-Evaluation   Current issues with Current Anxiety/Panic;Current Stress Concerns    Comments Melaney lives with her son and he helps take care of her as needed. Orie states that she continues to drive and likes to get out of the house for rehab. She states that she continues to take her anxiety  medication and that it is working well to manage her stress and anxiety. She also will watch movies to help manage her stress and relax. Maitland states she continues to feel safe in her home and is interested in exploring the [employer] for continued outings.    Expected Outcomes ST: No barriers to patient graduating rehab as she continues her medication that is effective for anxiety and stress. LT: Patient will continue to take medications as prescribed and reach out to MD as needed.    Interventions Relaxation education;Stress management education    Continue Psychosocial Services  No Follow up required          Education: Education Goals: Education classes will be provided on a weekly basis, covering required topics. Participant will state understanding/return demonstration of topics presented.  Learning Barriers/Preferences:  Learning Barriers/Preferences - 10/03/23 1424       Learning Barriers/Preferences   Learning Barriers Hearing    Learning Preferences None          General Pulmonary Education Topics:  Infection Prevention: - Provides verbal and written material to individual with discussion of infection control including proper hand washing and proper equipment cleaning during exercise session. Flowsheet Row Pulmonary Rehab from 01/09/2024 in [facility] Cardiac and Pulmonary Rehab  Date 10/09/23  Educator [facility]  Instruction Review Code 1- Verbalizes Understanding    Falls Prevention: - Provides verbal and written material to individual with discussion of falls prevention and safety. Flowsheet Row Pulmonary Rehab from 01/09/2024 in [facility] Cardiac and Pulmonary Rehab  Date 10/09/23  Educator [facility]  Instruction Review Code 1- Verbalizes Understanding    Chronic Lung Disease Review: - Group verbal instruction with posters, models, PowerPoint presentations and videos,  to review new updates,  new respiratory medications, new advancements in procedures and treatments. Providing  information on websites and 800 numbers for continued self-education. Includes information about supplement oxygen , available portable oxygen  systems, continuous and intermittent flow rates, oxygen  safety, concentrators, and Medicare reimbursement for oxygen . Explanation of Pulmonary Drugs, including class, frequency, complications, importance of spacers, rinsing mouth after steroid MDI's, and proper cleaning methods for nebulizers. Review of basic lung anatomy and physiology related to function, structure, and complications of lung disease. Review of risk factors. Discussion about methods for diagnosing sleep apnea and types of masks and machines for OSA. Includes a review of the use of types of environmental controls: home humidity, furnaces, filters, dust mite/pet prevention, HEPA vacuums. Discussion about weather changes, air quality and the benefits of nasal washing. Instruction on Warning signs, infection symptoms, calling MD promptly, preventive modes, and value of vaccinations. Review of effective airway clearance, coughing and/or vibration techniques. Emphasizing that all should Create an Action Plan. Written material provided at class time. Flowsheet Row Pulmonary Rehab from 01/09/2024 in [facility] Cardiac and Pulmonary Rehab  Education need identified 10/09/23    AED/CPR: - Group verbal and written instruction with the use of models to demonstrate the basic use of the AED with the basic ABC's of resuscitation.    Tests and Procedures:  - Group verbal and visual presentation and models provide information about basic cardiac anatomy and function. Reviews the testing methods done to diagnose heart disease and the outcomes of the test results. Describes the treatment choices: Medical Management, Angioplasty, or Coronary Bypass Surgery for treating various heart conditions including Myocardial Infarction, Angina, Valve Disease, and Cardiac Arrhythmias.  Written material provided at class  time.   Medication Safety: - Group verbal and visual instruction to review commonly prescribed medications for heart and lung disease. Reviews the medication, class of the drug, and side effects. Includes the steps to properly store meds and maintain the prescription regimen.  Written material given at graduation. Flowsheet Row Pulmonary Rehab from 01/09/2024 in [facility] Cardiac and Pulmonary Rehab  Date 11/21/23  Educator lc  Instruction Review Code 1- Verbalizes Understanding    Other: -Provides group and verbal instruction on various topics (see comments)   Knowledge Questionnaire Score:  Knowledge Questionnaire Score - 01/21/24 1414       Knowledge Questionnaire Score   Pre Score 13/18    Post Score 16/18           Core Components/Risk Factors/Patient Goals at Admission:  Personal Goals and Risk Factors at Admission - 10/03/23 1424       Core Components/Risk Factors/Patient Goals on Admission    Weight Management Yes;Weight Gain    Intervention Weight Management: Develop a combined nutrition and exercise program designed to reach desired caloric intake, while maintaining appropriate intake of nutrient and fiber, sodium and fats, and appropriate energy expenditure required for the weight goal.;Weight Management: Provide education and appropriate resources to help participant work on and attain dietary goals.;Weight Management/Obesity: Establish reasonable short term and long term weight goals.    Expected Outcomes Short Term: Continue to assess and modify interventions until short term weight is achieved;Weight Maintenance: Understanding of the daily nutrition guidelines, which includes 25-35% calories from fat, 7% or less cal from saturated fats, less than 200mg  cholesterol, less than 1.5gm of sodium, & 5 or more servings of fruits and vegetables daily;Understanding recommendations for meals to include 15-35% energy as protein, 25-35% energy from fat, 35-60% energy from  carbohydrates, less than 200mg  of dietary cholesterol, 20-35 gm  of total fiber daily;Understanding of distribution of calorie intake throughout the day with the consumption of 4-5 meals/snacks;Weight Gain: Understanding of general recommendations for a high calorie, high protein meal plan that promotes weight gain by distributing calorie intake throughout the day with the consumption for 4-5 meals, snacks, and/or supplements    Improve shortness of breath with ADL's Yes    Intervention Provide education, individualized exercise plan and daily activity instruction to help decrease symptoms of SOB with activities of daily living.    Expected Outcomes Short Term: Improve cardiorespiratory fitness to achieve a reduction of symptoms when performing ADLs;Long Term: Be able to perform more ADLs without symptoms or delay the onset of symptoms    Hypertension Yes    Intervention Provide education on lifestyle modifcations including regular physical activity/exercise, weight management, moderate sodium restriction and increased consumption of fresh fruit, vegetables, and low fat dairy, alcohol moderation, and smoking cessation.;Monitor prescription use compliance.    Expected Outcomes Short Term: Continued assessment and intervention until BP is < 140/47mm HG in hypertensive participants. < 130/16mm HG in hypertensive participants with diabetes, heart failure or chronic kidney disease.;Long Term: Maintenance of blood pressure at goal levels.    Lipids Yes    Intervention Provide education and support for participant on nutrition & aerobic/resistive exercise along with prescribed medications to achieve LDL 70mg , HDL >40mg .    Expected Outcomes Short Term: Participant states understanding of desired cholesterol values and is compliant with medications prescribed. Participant is following exercise prescription and nutrition guidelines.;Long Term: Cholesterol controlled with medications as prescribed, with  individualized exercise RX and with personalized nutrition plan. Value goals: LDL < 70mg , HDL > 40 mg.          Education:Diabetes - Individual verbal and written instruction to review signs/symptoms of diabetes, desired ranges of glucose level fasting, after meals and with exercise. Acknowledge that pre and post exercise glucose checks will be done for 3 sessions at entry of program.   Know Your Numbers and Heart Failure: - Group verbal and visual instruction to discuss disease risk factors for cardiac and pulmonary disease and treatment options.  Reviews associated critical values for Overweight/Obesity, Hypertension, Cholesterol, and Diabetes.  Discusses basics of heart failure: signs/symptoms and treatments.  Introduces Heart Failure Zone chart for action plan for heart failure. Written material provided at class time. Flowsheet Row Pulmonary Rehab from 01/09/2024 in [facility] Cardiac and Pulmonary Rehab  Date 12/05/23  Educator [facility]  Instruction Review Code 1- Verbalizes Understanding    Core Components/Risk Factors/Patient Goals Review:   Goals and Risk Factor Review     Row Name 10/25/23 1425 11/15/23 1419 12/19/23 1422 01/21/24 1425       Core Components/Risk Factors/Patient Goals Review   Personal Goals Review Improve shortness of breath with ADL's Hypertension Weight Management/Obesity;Lipids;Hypertension;Improve shortness of breath with ADL's Improve shortness of breath with ADL's;Hypertension;Lipids;Stress;Other  desires to gain weight    Review Spoke to patient about their shortness of breath and what they can do to improve. Patient has been informed of breathing techniques when starting the program. Patient is informed to tell staff if they have had any med changes and that certain meds they are taking or not taking can be causing shortness of breath. Check patients wrist blood pressure cuff agains our manual pressure. Her cuff was off by 30 points systolic. She states that she will  not use that one and try a different option. Hala reports that she does take all BP and cholesterol meds.  She has not been taking BP at home and states that the one she has is not accurate. She was encouraged to buy a new one is she is able and to start checking BP at home. She reports that her weight has been steady but she continues to want to try to work towards gaining weight. She states that she is comfortable using PLB to help control SOB. Milka reports that she is compliant with her BP, cholesterol, anxiety, and respiratory medications. Kayte was encouraged to reach out to her insurance company as some will provide a BP cuff for home use as she has not replaced her defective one as of yet. Denisha will also be exploring options to purchase or secure a pulse oximeter. Vanilla reports that she is pleased with her weight as she has been gaining a little. She stated that the nutrition education class was helpful and has helped her make better food choices. Abraham deferred a RD discussion/appointment.    Expected Outcomes Short: Attend [facility] regularly to improve shortness of breath with ADL's. Long: maintain independence with ADL's Short: monitor blood pressure at home. Long: check blood pressure at home independently. Short: start monitoring BP at home. Long: control cardiac risk factors. ST: Contact insurance company to determine if BP cuff available/secure a BP cuff for home use; graduating rehab very soon. LT: Monitor BP post graduation of rehab program. Continue to eat a healthy diet to ensure healthy weight gain. Review schedule at [facility] to continue outings and exercise to manage stress and improve mood.       Core Components/Risk Factors/Patient Goals at Discharge (Final Review):   Goals and Risk Factor Review - 01/21/24 1425       Core Components/Risk Factors/Patient Goals Review   Personal Goals Review Improve shortness of breath with ADL's;Hypertension;Lipids;Stress;Other    desires to gain weight   Review Aslan reports that she is compliant with her BP, cholesterol, anxiety, and respiratory medications. Tritia was encouraged to reach out to her insurance company as some will provide a BP cuff for home use as she has not replaced her defective one as of yet. Claribel will also be exploring options to purchase or secure a pulse oximeter. Neta reports that she is pleased with her weight as she has been gaining a little. She stated that the nutrition education class was helpful and has helped her make better food choices. Katarzyna deferred a RD discussion/appointment.    Expected Outcomes ST: Contact insurance company to determine if BP cuff available/secure a BP cuff for home use; graduating rehab very soon. LT: Monitor BP post graduation of rehab program. Continue to eat a healthy diet to ensure healthy weight gain. Review schedule at [employer] to continue outings and exercise to manage stress and improve mood.          ITP Comments:  ITP Comments     Row Name 10/03/23 1428 10/09/23 1512 10/15/23 1412 10/31/23 0945 11/28/23 1255   ITP Comments Virtual Visit completed. Patient informed on EP and RD appointment and 6 Minute walk test. Patient also informed of patient health questionnaires on My Chart. Patient Verbalizes understanding. Visit diagnosis can be found in [facility] 09/13/2023. Completed and gym orientation for pulmonary rehab. Initial ITP created and sent for review to Dr. Fuad Aleskerov, Medical Director. First full day of exercise!  Patient was oriented to gym and equipment including functions, settings, policies, and procedures.  Patient's individual exercise prescription and treatment plan were reviewed.  All starting  workloads were established based on the results of the 6 minute walk test done at initial orientation visit.  The plan for exercise progression was also introduced and progression will be customized based on patient's performance and goals. 30  Day review completed. Medical Director ITP review done; changes made as directed and signed approval by Medical Director. 30 Day review completed. Medical Director ITP review done, changes made as directed, and signed approval by Medical Director.    Row Name 12/26/23 0919 01/23/24 1053         ITP Comments 30 Day review completed. Medical Director ITP review done, changes made as directed, and signed approval by Medical Director. 30 Day review completed. Medical Director ITP review done, changes made as directed, and signed approval by Medical Director.         Comments: 30 Day Review      [1]  Current Outpatient Medications:    acetaminophen  (TYLENOL ) 500 MG tablet, Take 500 mg by mouth every 6 (six) hours as needed for mild pain, moderate pain, fever or headache. , Disp: , Rfl:    albuterol  (PROVENTIL  HFA;VENTOLIN  HFA) 108 (90 Base) MCG/ACT inhaler, Inhale 2 puffs into the lungs every 4 (four) hours as needed for wheezing or shortness of breath., Disp: , Rfl:    albuterol  (PROVENTIL ) (2.5 MG/3ML) 0.083% nebulizer solution, Take 2.5 mg by nebulization every 6 (six) hours as needed for wheezing or shortness of breath., Disp: , Rfl:    ALPRAZolam  (XANAX ) 0.5 MG tablet, Take 0.5 mg by mouth daily as needed. (Patient not taking: Reported on 10/03/2023), Disp: , Rfl:    ALPRAZolam  (XANAX ) 0.5 MG tablet, Take by mouth. (Patient not taking: Reported on 10/03/2023), Disp: , Rfl:    ALPRAZolam  (XANAX ) 0.5 MG tablet, Take 0.5 mg by mouth., Disp: , Rfl:    amLODipine (NORVASC) 2.5 MG tablet, Take 2.5 mg by mouth., Disp: , Rfl:    budesonide -formoterol  (SYMBICORT ) 160-4.5 MCG/ACT inhaler, Inhale 2 puffs into the lungs 2 (two) times daily., Disp: , Rfl:    cyanocobalamin  1000 MCG tablet, Take 1,000 mcg by mouth daily., Disp: , Rfl:    escitalopram  (LEXAPRO ) 10 MG tablet, Take 10 mg by mouth at bedtime. (Patient not taking: Reported on 10/03/2023), Disp: , Rfl: 0   fluticasone  (FLONASE ) 50 MCG/ACT nasal  spray, Place 2 sprays into both nostrils at bedtime., Disp: , Rfl:    furosemide  (LASIX ) 20 MG tablet, Take 20 mg by mouth at bedtime. (Patient not taking: Reported on 10/03/2023), Disp: , Rfl:    gabapentin  (NEURONTIN ) 300 MG capsule, Take 300 mg by mouth at bedtime., Disp: , Rfl:    lisinopril  (PRINIVIL ,ZESTRIL ) 2.5 MG tablet, Take 2.5 mg by mouth at bedtime., Disp: , Rfl:    lisinopril  (ZESTRIL ) 2.5 MG tablet, Take 1 tablet by mouth daily. (Patient not taking: Reported on 10/03/2023), Disp: , Rfl:    loperamide (IMODIUM) 2 MG capsule, Take 2 mg by mouth as needed for diarrhea or loose stools., Disp: , Rfl:    metoprolol  succinate (TOPROL -XL) 25 MG 24 hr tablet, Take 25 mg by mouth daily. (Patient not taking: Reported on 10/03/2023), Disp: , Rfl:    OXYGEN , Inhale 2 L into the lungs continuous., Disp: , Rfl:    pantoprazole (PROTONIX) 40 MG tablet, Take 1 tablet by mouth at bedtime. (Patient not taking: Reported on 10/03/2023), Disp: , Rfl:    pantoprazole (PROTONIX) 40 MG tablet, Take 1 tablet by mouth daily., Disp: , Rfl:    simvastatin  (  ZOCOR ) 20 MG tablet, Take 20 mg by mouth at bedtime. (Patient not taking: Reported on 10/03/2023), Disp: , Rfl:    SPIRIVA  RESPIMAT 2.5 MCG/ACT AERS, Inhale 2 puffs into the lungs every morning. (Patient not taking: Reported on 10/03/2023), Disp: , Rfl: 0   Tiotropium Bromide  Monohydrate (SPIRIVA  RESPIMAT) 2.5 MCG/ACT AERS, Inhale 5 mcg into the lungs., Disp: , Rfl:    traMADol  (ULTRAM ) 50 MG tablet, Take 50 mg by mouth 2 (two) times daily as needed for moderate pain. (Patient not taking: Reported on 10/03/2023), Disp: , Rfl:    traMADol  (ULTRAM ) 50 MG tablet, Take 50 mg by mouth., Disp: , Rfl:    Vitamin D , Ergocalciferol , (DRISDOL ) 50000 units CAPS capsule, Take 50,000 Units by mouth every 7 (seven) days. Pt takes on Saturday., Disp: , Rfl:  [2]  Social History Tobacco Use  Smoking Status Former   Current packs/day: 0.00   Average packs/day: 1.5 packs/day for  52.5 years (78.8 ttl pk-yrs)   Types: Cigarettes   Start date: 05/10/1941   Quit date: 11/09/1993   Years since quitting: 30.2  Smokeless Tobacco Never

## 2024-01-23 NOTE — Progress Notes (Signed)
 Daily Session Note  Patient Details  Name: Tanya Rhodes MRN: [id number] Date of Birth: February 10, 1940 Referring Provider:   Flowsheet Row Pulmonary Rehab from 10/09/2023 in [facility] Cardiac and Pulmonary Rehab  Referring Provider Dr. Lavelle Servant    Encounter Date: 01/23/2024  Check In:  Session Check In - 01/23/24 1358       Check-In   Supervising physician immediately available to respond to emergencies See telemetry face sheet for immediately available ER MD    Location [facility]-Cardiac & Pulmonary Rehab    Staff Present Burnard Davenport RN,BSN,MPA;Laura Cates RN,BSN;Timmy Bubeck Dyane BS, ACSM CEP, Exercise Physiologist;Noah Tickle, BS, Exercise Physiologist    Virtual Visit No    Medication changes reported     No    Fall or balance concerns reported    No    Tobacco Cessation No Change    Warm-up and Cool-down Performed on first and last piece of equipment    Resistance Training Performed Yes    VAD Patient? No    PAD/SET Patient? No      Pain Assessment   Currently in Pain? No/denies             Tobacco Use History[1]  Goals Met:  Proper associated with RPD/PD & O2 Sat Independence with exercise equipment Using PLB without cueing & demonstrates good technique Exercise tolerated well No report of concerns or symptoms today Strength training completed today  Goals Unmet:  Not Applicable  Comments: Pt able to follow exercise prescription today without complaint.  Will continue to monitor for progression.    Dr. Oneil Pinal is Medical Director for [facility] Cardiac Rehabilitation.  Dr. Fuad Aleskerov is Medical Director for [facility] Pulmonary Rehabilitation.    [1]  Social History Tobacco Use  Smoking Status Former   Current packs/day: 0.00   Average packs/day: 1.5 packs/day for 52.5 years (78.8 ttl pk-yrs)   Types: Cigarettes   Start date: 05/10/1941   Quit date: 11/09/1993   Years since quitting: 30.2  Smokeless Tobacco Never

## 2024-01-24 ENCOUNTER — Encounter: Admitting: *Deleted

## 2024-01-24 DIAGNOSIS — J449 Chronic obstructive pulmonary disease, unspecified: Secondary | ICD-10-CM

## 2024-01-24 NOTE — Progress Notes (Signed)
 Daily Session Note  Patient Details  Name: Tanya Rhodes MRN: [id number] Date of Birth: Jul 04, 1940 Referring Provider:   Flowsheet Row Pulmonary Rehab from 10/09/2023 in [facility] Cardiac and Pulmonary Rehab  Referring Provider Dr. Lavelle Servant    Encounter Date: 01/24/2024  Check In:  Session Check In - 01/24/24 1402       Check-In   Supervising physician immediately available to respond to emergencies See telemetry face sheet for immediately available ER MD    Location [facility]-Cardiac & Pulmonary Rehab    Staff Present Othel Durand, RN, BSN, CCRP;Laureen Delores, BS, RRT, CPFT;Joseph [employer], BS, Exercise Physiologist    Virtual Visit No    Medication changes reported     No    Fall or balance concerns reported    No    Warm-up and Cool-down Performed on first and last piece of equipment    Resistance Training Performed Yes    VAD Patient? No    PAD/SET Patient? No      Pain Assessment   Currently in Pain? No/denies             Tobacco Use History[1]  Goals Met:  Proper associated with RPD/PD & O2 Sat Independence with exercise equipment Exercise tolerated well No report of concerns or symptoms today  Goals Unmet:  Not Applicable  Comments: Pt able to follow exercise prescription today without complaint.  Will continue to monitor for progression.    Dr. Oneil Pinal is Medical Director for [facility] Cardiac Rehabilitation.  Dr. Fuad Aleskerov is Medical Director for [facility] Pulmonary Rehabilitation.    [1]  Social History Tobacco Use  Smoking Status Former   Current packs/day: 0.00   Average packs/day: 1.5 packs/day for 52.5 years (78.8 ttl pk-yrs)   Types: Cigarettes   Start date: 05/10/1941   Quit date: 11/09/1993   Years since quitting: 30.2  Smokeless Tobacco Never

## 2024-01-28 ENCOUNTER — Encounter

## 2024-01-30 ENCOUNTER — Encounter

## 2024-01-30 DIAGNOSIS — J449 Chronic obstructive pulmonary disease, unspecified: Secondary | ICD-10-CM | POA: Diagnosis not present

## 2024-01-30 NOTE — Progress Notes (Signed)
 Discharge Summary Patient: Tanya Rhodes DOB: [birth year]  Tanya Rhodes graduated today from  rehab with 36 sessions completed.  Details of the patient's exercise prescription and what Tanya Rhodes needs to do in order to continue the prescription and progress were discussed with patient.  Patient was given a copy of prescription and goals.  Patient verbalized understanding. Tanya Rhodes plans to continue to exercise by walking at home and using hand weights. Tanya Rhodes may also check into the [facility] and the [facility].   6 Minute Walk     Row Name 10/09/23 1513 01/16/24 1407       6 Minute Walk   Phase Initial Discharge    Distance 770 feet 900 feet    Distance % Change -- 17 %    Distance Feet Change -- 130 ft    Walk Time 6 minutes 6 minutes    # of Rest Breaks 0 0    MPH 1.45 1.7    METS 1.61 2    RPE 13 13    Perceived Dyspnea  3 1    VO2 Peak 5.64 6.83    Symptoms No No    Resting HR 91 bpm 80 bpm    Resting BP 146/62 128/80    Resting Oxygen  Saturation  93 % 99 %    Exercise Oxygen  Saturation  during 6 min walk 90 % 93 %    Max Ex. HR 102 bpm 109 bpm    Max Ex. BP 142/68 152/78    2 Minute Post BP 138/62 140/78      Interval HR   1 Minute HR 90 88    2 Minute HR 91 98    3 Minute HR 93 98    4 Minute HR 99 95    5 Minute HR 99 102    6 Minute HR 102 109    2 Minute Post HR 86 91    Interval Heart Rate? Yes Yes      Interval Oxygen    Interval Oxygen ? Yes Yes    Baseline Oxygen  Saturation % 93 % 99 %    1 Minute Oxygen  Saturation % 97 % 99 %    1 Minute Liters of Oxygen  2 L 2 L    2 Minute Oxygen  Saturation % 97 % 97 %    2 Minute Liters of Oxygen  2 L 2 L    3 Minute Oxygen  Saturation % 90 % 96 %    3 Minute Liters of Oxygen  2 L 0 L    4 Minute Oxygen  Saturation % 91 % 94 %    4 Minute Liters of Oxygen  2 L 0 L    5 Minute Oxygen  Saturation % 92 % 93 %    5 Minute Liters of Oxygen  2 L 2 L    6 Minute Oxygen  Saturation % 96 % 93 %    6 Minute Liters of Oxygen  2 L 2 L     2 Minute Post Oxygen  Saturation % 98 % 97 %    2 Minute Post Liters of Oxygen  2 L 2 L

## 2024-01-30 NOTE — Progress Notes (Signed)
 Pulmonary Individual Treatment Plan  Patient Details  Name: Tanya Rhodes MRN: [id number] Date of Birth: 1940/12/06 Referring Provider:   Flowsheet Row Pulmonary Rehab from 10/09/2023 in [facility] Cardiac and Pulmonary Rehab  Referring Provider Dr. Lavelle Servant    Initial Encounter Date:  Flowsheet Row Pulmonary Rehab from 10/09/2023 in [facility] Cardiac and Pulmonary Rehab  Date 10/09/23    Visit Diagnosis: COPD, moderate (HCC)  Patient's Home Medications on Admission: Current Medications[1]  Past Medical History: Past Medical History:  Diagnosis Date   Acute cholecystitis    a.) s/p robot assisted laparoscopic cholecystectomy 09/14/2022   Adenomatous polyp of colon    Allergic rhinitis    Anxiety    a.) on BZO PRN (alprazolam )   Aortic atherosclerosis    Atypical ductal hyperplasia of right breast 07/21/2022   a.) CNB 07/21/2022 --> pathology (+) for ER/PR + ADH   Centrilobular emphysema (HCC)    Chronic systolic CHF (congestive heart failure) (HCC)    a.) TTE 03/31/2015: EF 20-25%, diffuse HK, mod LAE, sev LVE, mild RAE/RVE, mod TR; b.) MV 05/14/2015: EF 32%; c.) TTE 12/12/2016: EF 45%, no RWMAs, norm RVSF, mild MR/TR; d.) TTE 08/24/2022: EF 40%, sep HK, G1DD, norm RVSF, triv AR/TR, mild MR; e.) MV 08/24/2022: EF 49%   Colon polyposis    a.) s/p partial colectomy 2013   COPD (chronic obstructive pulmonary disease) (HCC)    Coronary artery disease involving native coronary artery of native heart without angina pectoris    a.) LHC 07/06/2008: 20% mLAD, 95% pRCA (3.5 x 18 mm Vision BMS); b.) MV 05/14/2015: no ischemia; c.) MV 08/24/2022: small reversible perfusion abnormality in the inferior region consistent with mild inferior ischemia.   Cortical senile cataract    DDD (degenerative disc disease), lumbar    Depression    Dilated cardiomyopathy (HCC)    a.) TTE 03/31/2015: EF 20-25%; b.) MV 05/14/2015: EF 32%; c.) TTE 12/12/2016: EF 45%; d.) TTE 08/24/2022: EF 40%; e.) MV  08/24/2022: EF 49%   Dyspnea    Former smoker    GERD (gastroesophageal reflux disease)    HLD (hyperlipidemia)    Hypertension    IDA (iron  deficiency anemia)    Internal hemorrhoids    LBBB (left bundle branch block)    Levoscoliosis of thoracic spine    Low serum vitamin D     Lumbar radiculitis    Lumbar spondylitis    Lumbar stenosis with neurogenic claudication    Melena    Osteoarthritis    PUD (peptic ulcer disease)    Pulmonary nodule    Restless leg    Status post left rotator cuff repair    Supplemental oxygen  dependent    a.) 2L/Bernice continuous   Unintentional weight loss    Vitamin B12 deficiency     Tobacco Use: Tobacco Use History[2]  Labs: Review Flowsheet       Latest Ref Rng & Units 10/27/2013  Labs for ITP Cardiac and Pulmonary Rehab  Hemoglobin A1c 4.2 - 6.3 % 5.9      Pulmonary Assessment Scores:  Pulmonary Assessment Scores     Row Name 10/09/23 1341 01/16/24 1412 01/21/24 1412     ADL UCSD   ADL Phase -- Exit --   SOB Score total -- -- 71   Rest -- -- 0   Walk -- -- 4   Stairs -- -- 5   Bath -- -- 0   Dress -- -- 0   Shop -- -- 0  CAT Score   CAT Score 20 -- 23     mMRC Score   mMRC Score -- 1 --      UCSD: Self-administered rating of dyspnea associated with activities of daily living (ADLs) 6-point scale (0 = not at all to 5 = maximal or unable to do because of breathlessness)  Scoring Scores range from 0 to 120.  Minimally important difference is 5 units  CAT: CAT can identify the health impairment of COPD patients and is better correlated with disease progression.  CAT has a scoring range of zero to 40. The CAT score is classified into four groups of low (less than 10), medium (10 - 20), high (21-30) and very high (31-40) based on the impact level of disease on health status. A CAT score over 10 suggests significant symptoms.  A worsening CAT score could be explained by an exacerbation, poor medication adherence, poor  inhaler technique, or progression of COPD or comorbid conditions.  CAT MCID is 2 points  mMRC: mMRC (Modified Medical Research Council) Dyspnea Scale is used to assess the degree of baseline functional disability in patients of respiratory disease due to dyspnea. No minimal important difference is established. A decrease in score of 1 point or greater is considered a positive change.   Pulmonary Function Assessment:  Pulmonary Function Assessment - 10/03/23 1423       Pulmonary Function Tests   FEV1% 51 %    FEV1/FVC Ratio 45      Breath   Shortness of Breath Yes;Limiting activity          Exercise Target Goals: Exercise Program Goal: Individual exercise prescription set using results from initial 6 min walk test and THRR while considering  patients activity barriers and safety.   Exercise Prescription Goal: Initial exercise prescription builds to 30-45 minutes a day of aerobic activity, 2-3 days per week.  Home exercise guidelines will be given to patient during program as part of exercise prescription that the participant will acknowledge.  Education: Aerobic Exercise: - Group verbal and visual presentation on the components of exercise prescription. Introduces F.I.T.T principle from ACSM for exercise prescriptions.  Reviews F.I.T.T. principles of aerobic exercise including progression. Written material provided at class time. Flowsheet Row Pulmonary Rehab from 01/09/2024 in [facility] Cardiac and Pulmonary Rehab  Date 10/24/23  Educator mb  Instruction Review Code 1- Verbalizes Understanding    Education: Resistance Exercise: - Group verbal and visual presentation on the components of exercise prescription. Introduces F.I.T.T principle from ACSM for exercise prescriptions  Reviews F.I.T.T. principles of resistance exercise including progression. Written material provided at class time. Flowsheet Row Pulmonary Rehab from 01/09/2024 in [facility] Cardiac and Pulmonary Rehab  Date  12/26/23  Educator kh  Instruction Review Code 1- Verbalizes Understanding     Education: Exercise & Equipment Safety: - Individual verbal instruction and demonstration of equipment use and safety with use of the equipment. Flowsheet Row Pulmonary Rehab from 01/09/2024 in [facility] Cardiac and Pulmonary Rehab  Date 10/09/23  Educator [facility]  Instruction Review Code 1- Verbalizes Understanding    Education: Exercise Physiology & General Exercise Guidelines: - Group verbal and written instruction with models to review the exercise physiology of the cardiovascular system and associated critical values. Provides general exercise guidelines with specific guidelines to those with heart or lung disease.  Flowsheet Row Pulmonary Rehab from 01/09/2024 in [facility] Cardiac and Pulmonary Rehab  Date 12/19/23  Educator kh  Instruction Review Code 1- [employer] Understanding    Education: Flexibility,  Balance, Mind/Body Relaxation: - Group verbal and visual presentation with interactive activity on the components of exercise prescription. Introduces F.I.T.T principle from ACSM for exercise prescriptions. Reviews F.I.T.T. principles of flexibility and balance exercise training including progression. Also discusses the mind body connection.  Reviews various relaxation techniques to help reduce and manage stress (i.e. Deep breathing, progressive muscle relaxation, and visualization). Balance handout provided to take home. Written material provided at class time. Flowsheet Row Pulmonary Rehab from 01/09/2024 in [facility] Cardiac and Pulmonary Rehab  Date 12/26/23  Educator [facility]  Instruction Review Code 1- Verbalizes Understanding    Activity Barriers & Risk Stratification:  Activity Barriers & Cardiac Risk Stratification - 10/09/23 1516       Activity Barriers & Cardiac Risk Stratification   Activity Barriers Shortness of Breath;History of Falls          6 Minute Walk:  6 Minute Walk     Row Name 10/09/23 1513  01/16/24 1407       6 Minute Walk   Phase Initial Discharge    Distance 770 feet 900 feet    Distance % Change -- 17 %    Distance Feet Change -- 130 ft    Walk Time 6 minutes 6 minutes    # of Rest Breaks 0 0    MPH 1.45 1.7    METS 1.61 2    RPE 13 13    Perceived Dyspnea  3 1    VO2 Peak 5.64 6.83    Symptoms No No    Resting HR 91 bpm 80 bpm    Resting BP 146/62 128/80    Resting Oxygen  Saturation  93 % 99 %    Exercise Oxygen  Saturation  during 6 min walk 90 % 93 %    Max Ex. HR 102 bpm 109 bpm    Max Ex. BP 142/68 152/78    2 Minute Post BP 138/62 140/78      Interval HR   1 Minute HR 90 88    2 Minute HR 91 98    3 Minute HR 93 98    4 Minute HR 99 95    5 Minute HR 99 102    6 Minute HR 102 109    2 Minute Post HR 86 91    Interval Heart Rate? Yes Yes      Interval Oxygen    Interval Oxygen ? Yes Yes    Baseline Oxygen  Saturation % 93 % 99 %    1 Minute Oxygen  Saturation % 97 % 99 %    1 Minute Liters of Oxygen  2 L 2 L    2 Minute Oxygen  Saturation % 97 % 97 %    2 Minute Liters of Oxygen  2 L 2 L    3 Minute Oxygen  Saturation % 90 % 96 %    3 Minute Liters of Oxygen  2 L 0 L    4 Minute Oxygen  Saturation % 91 % 94 %    4 Minute Liters of Oxygen  2 L 0 L    5 Minute Oxygen  Saturation % 92 % 93 %    5 Minute Liters of Oxygen  2 L 2 L    6 Minute Oxygen  Saturation % 96 % 93 %    6 Minute Liters of Oxygen  2 L 2 L    2 Minute Post Oxygen  Saturation % 98 % 97 %    2 Minute Post Liters of Oxygen  2 L 2 L  Oxygen  Initial Assessment:  Oxygen  Initial Assessment - 10/03/23 1422       Home Oxygen    Home Oxygen  Device Home Concentrator;Portable Concentrator;E-Tanks    Sleep Oxygen  Prescription Continuous    Liters per minute 2    Home Exercise Oxygen  Prescription Continuous    Liters per minute 2    Home Resting Oxygen  Prescription Continuous    Liters per minute 2    Compliance with Home Oxygen  Use Yes      Initial 6 min Walk   Oxygen  Used Continuous     Liters per minute 2      Program Oxygen  Prescription   Program Oxygen  Prescription Continuous    Liters per minute 2      Intervention   Short Term Goals To learn and exhibit compliance with exercise, home and travel O2 prescription;To learn and understand importance of monitoring SPO2 with pulse oximeter and demonstrate accurate use of the pulse oximeter.;To learn and demonstrate proper pursed lip breathing techniques or other breathing techniques. ;To learn and understand importance of maintaining oxygen  saturations>88%;To learn and demonstrate proper use of respiratory medications    Long  Term Goals Exhibits compliance with exercise, home  and travel O2 prescription;Maintenance of O2 saturations>88%;Compliance with respiratory medication;Demonstrates proper use of MDIs;Exhibits proper breathing techniques, such as pursed lip breathing or other method taught during program session;Verbalizes importance of monitoring SPO2 with pulse oximeter and return demonstration          Oxygen  Re-Evaluation:  Oxygen  Re-Evaluation     Row Name 10/15/23 1413 10/25/23 1423 11/15/23 1406 12/19/23 1550 01/21/24 1417     Program Oxygen  Prescription   Program Oxygen  Prescription -- Continuous;E-Tanks Continuous;E-Tanks Continuous;E-Tanks Continuous;E-Tanks   Liters per minute -- 2 2 2 2      Home Oxygen    Home Oxygen  Device -- Home Concentrator;Portable Concentrator;E-Tanks Home Concentrator;Portable Concentrator;E-Tanks Home Concentrator;Portable Concentrator;E-Tanks Home Concentrator;Portable Concentrator;E-Tanks   Sleep Oxygen  Prescription -- Continuous Continuous Continuous Continuous   Liters per minute -- 2 2 2 2    Home Exercise Oxygen  Prescription -- Continuous Continuous Continuous Continuous   Liters per minute -- 2 2 2 2    Home Resting Oxygen  Prescription -- Continuous Continuous Continuous Continuous   Liters per minute -- 2 2 2 2    Compliance with Home Oxygen  Use -- Yes Yes -- Yes      Goals/Expected Outcomes   Short Term Goals -- To learn and demonstrate proper pursed lip breathing techniques or other breathing techniques.  To learn and exhibit compliance with exercise, home and travel O2 prescription To learn and understand importance of monitoring SPO2 with pulse oximeter and demonstrate accurate use of the pulse oximeter.;To learn and understand importance of maintaining oxygen  saturations>88%;To learn and demonstrate proper pursed lip breathing techniques or other breathing techniques. ;To learn and demonstrate proper use of respiratory medications To learn and understand importance of monitoring SPO2 with pulse oximeter and demonstrate accurate use of the pulse oximeter.;Other  Patient understands how to use a pulse ox but has yet to get one for home use. Patient was encouraged to look on Amazon or elsewhere for an affordable unit as the ones at [facility] she said are cost prohibitive.   Long  Term Goals -- Exhibits proper breathing techniques, such as pursed lip breathing or other method taught during program session Exhibits compliance with exercise, home  and travel O2 prescription Maintenance of O2 saturations>88%;Compliance with respiratory medication;Exhibits proper breathing techniques, such as pursed lip breathing or other method taught during program  session;Verbalizes importance of monitoring SPO2 with pulse oximeter and return demonstration Maintenance of O2 saturations>88%;Compliance with respiratory medication   Comments Reviewed PLB technique with pt.  Talked about how it works and it's importance in maintaining their exercise saturations. Informed patient how to perform the Pursed Lipped breathing technique. Told patient to Inhale through the nose and out the mouth with pursed lips to keep their airways open, help oxygenate them better, practice when at rest or doing strenuous activity. Patient Verbalizes understanding of technique and will work on and be reiterated during  [employer]. Lindsie wanted staff to look at her new portable pulse oximeter. The information on the websit showed 3 liters and informed patient how to work the timer on the machine. Her oxygen  on 2 liters keeps her oxygen  saturation up when she is doing exercise. Informed her that if her oxygen  stays above 88 percent with her portable concentraor on she can use it if she is comfortable. Channell stated that she didn't think she has a pulse oximeter. She was encouraged to get on eif she does not have one and start monitoring Sa02 at home, maintaining it at 88% or above. PLB techniques were reviewed and verbalized understanding of how to use PLB to control SOB. She reports that she takes all respiratory medications. Dorina stated that she does not have a pulse oximeter and was encouraged to purchase one if affordable. Patient was encouraged to contact her health insurance company as sometimes they will help provide monitoring equipment. Henessy stated she will look on Amazon to see if there is one she can afford and purchase it. Neenah also stated that she has a new O2 tank that seems to have better pressure/flow. Overall, SOB has not improved/worsened.   Goals/Expected Outcomes Short: Become more profiecient at using PLB.   Long: Become independent at using PLB. Short: use PLB with exertion. Long: use PLB on exertion proficiently and independently. Short: check and use oxygen  as needed. Long: maintain oxygen  saturation and machine independently. Short: buy a pulse ox and start monitoring at home. Long: maitain oxygen  saturations and become independent with PLB. ST: Jabree will contact insurance company to see if any assistance in securing a pulse ox/will look on Amazon for an affordable pulse ox. No recognizable barriers for upcoming discharge. LT: Maintain oxygen  saturations and continue independence in PLB. Tamyka will reach out to MD as needed as respiratory concerns arise.      Oxygen  Discharge (Final  Oxygen  Re-Evaluation):  Oxygen  Re-Evaluation - 01/21/24 1417       Program Oxygen  Prescription   Program Oxygen  Prescription Continuous;E-Tanks    Liters per minute 2      Home Oxygen    Home Oxygen  Device Home Concentrator;Portable Concentrator;E-Tanks    Sleep Oxygen  Prescription Continuous    Liters per minute 2    Home Exercise Oxygen  Prescription Continuous    Liters per minute 2    Home Resting Oxygen  Prescription Continuous    Liters per minute 2    Compliance with Home Oxygen  Use Yes      Goals/Expected Outcomes   Short Term Goals To learn and understand importance of monitoring SPO2 with pulse oximeter and demonstrate accurate use of the pulse oximeter.;Other   Patient understands how to use a pulse ox but has yet to get one for home use. Patient was encouraged to look on Amazon or elsewhere for an affordable unit as the ones at [facility] she said are cost prohibitive.   Long  Term Goals Maintenance  of O2 saturations>88%;Compliance with respiratory medication    Comments Shatyra stated that she does not have a pulse oximeter and was encouraged to purchase one if affordable. Patient was encouraged to contact her health insurance company as sometimes they will help provide monitoring equipment. Yajaira stated she will look on Amazon to see if there is one she can afford and purchase it. Kesha also stated that she has a new O2 tank that seems to have better pressure/flow. Overall, SOB has not improved/worsened.    Goals/Expected Outcomes ST: Charisse will applied materials company to see if any assistance in securing a pulse ox/will look on Amazon for an affordable pulse ox. No recognizable barriers for upcoming discharge. LT: Maintain oxygen  saturations and continue independence in PLB. Shamicka will reach out to MD as needed as respiratory concerns arise.          Initial Exercise Prescription:  Initial Exercise Prescription - 10/09/23 1500       Date of Initial Exercise RX and  Referring Provider   Date 10/09/23    Referring Provider Dr. Lavelle Servant      Oxygen    Oxygen  Continuous    Liters 2L    Maintain Oxygen  Saturation 88% or higher      Recumbant Bike   Level 2    RPM 50    Watts 25    Minutes 15    METs 1.61      NuStep   Level 2    SPM 80    Minutes 15    METs 1.61      REL-XR   Level 2    Watts 25    Speed 50    Minutes 15    METs 1.61      T5 Nustep   Level 2    SPM 80    Minutes 15    METs 1.61      Biostep-RELP   Level 2    SPM 50    Minutes 15    METs 1.61      Track   Laps 20    Minutes 15    METs 2.09      Prescription Details   Duration Progress to 30 minutes of continuous aerobic without signs/symptoms of physical distress      Intensity   THRR 40-80% of Max Heartrate 109-127    Ratings of Perceived Exertion 11-13    Perceived Dyspnea 0-4      Progression   Progression Continue to progress workloads to maintain intensity without signs/symptoms of physical distress.      Resistance Training   Training Prescription Yes    Weight 1lb    Reps 10-15          Perform Capillary Blood Glucose checks as needed.  Exercise Prescription Changes:   Exercise Prescription Changes     Row Name 10/09/23 1500 10/25/23 0800 11/05/23 1100 11/08/23 1400 11/22/23 1500     Response to Exercise   Blood Pressure (Admit) 146/62 132/62 132/78 -- 118/60   Blood Pressure (Exercise) 142/68 154/68 162/78 -- 152/68   Blood Pressure (Exit) 138/62 132/64 128/64 -- 110/60   Heart Rate (Admit) 91 bpm 86 bpm 83 bpm -- 92 bpm   Heart Rate (Exercise) 102 bpm 101 bpm 109 bpm -- 110 bpm   Heart Rate (Exit) 86 bpm 93 bpm 79 bpm -- 73 bpm   Oxygen  Saturation (Admit) 93 % 98 % 98 % -- 98 %   Oxygen  Saturation (Exercise)  90 % 95 % 93 % -- 93 %   Oxygen  Saturation (Exit) 98 % 98 % 98 % -- 97 %   Rating of Perceived Exertion (Exercise) 13 15 15  -- 15   Perceived Dyspnea (Exercise) 3 3 4  -- 3   Symptoms none none none -- none    Comments results first 2 weeks of exercise -- -- --   Duration -- Progress to 30 minutes of  aerobic without signs/symptoms of physical distress Progress to 30 minutes of  aerobic without signs/symptoms of physical distress -- Progress to 30 minutes of  aerobic without signs/symptoms of physical distress   Intensity -- THRR unchanged THRR unchanged -- THRR unchanged     Progression   Progression -- Continue to progress workloads to maintain intensity without signs/symptoms of physical distress. Continue to progress workloads to maintain intensity without signs/symptoms of physical distress. -- Continue to progress workloads to maintain intensity without signs/symptoms of physical distress.   Average METs -- 2.1 2.33 -- 2.32     Resistance Training   Training Prescription -- Yes Yes -- Yes   Weight -- 1lb 1lb -- 2 lb   Reps -- 10-15 10-15 -- 10-15     Interval Training   Interval Training -- No No -- No     Oxygen    Oxygen  -- Continuous Continuous -- Continuous   Liters -- 2L 2 -- 2     Recumbant Bike   Level -- 1 2 -- 2   Watts -- 13 13 -- 19   Minutes -- 15 15 -- 15   METs -- 2.87 2.86 -- 2.99     NuStep   Level -- 1 2 -- 4   Minutes -- 15 15 -- 15   METs -- -- 3 -- 2.8     T5 Nustep   Level -- 2 1 -- 1   Minutes -- 15 15 -- 15   METs -- 2 1.9 -- 1.8     Biostep-RELP   Level -- 2 1 -- --   Minutes -- 15 15 -- --   METs -- 2 2 -- --     Track   Laps -- 8  Hall 26 -- 26   Minutes -- 15 15 -- 15   METs -- 1.44 2.41 -- 2.41     Home Exercise Plan   Plans to continue exercise at -- -- -- Home (comment)  Kaydon currently is walking around her house as well as using hand weights on days she does not attend the program. Home (comment)  Arletta currently is walking around her house as well as using hand weights on days she does not attend the program.   Frequency -- -- -- Add 2 additional days to program exercise sessions. Add 2 additional days to program exercise  sessions.   Initial Home Exercises Provided -- -- -- 11/08/23 11/08/23     Oxygen    Maintain Oxygen  Saturation -- 88% or higher 88% or higher -- 88% or higher    Row Name 12/10/23 0800 12/19/23 1100 01/07/24 0800 01/23/24 1600       Response to Exercise   Blood Pressure (Admit) 134/70 134/82 118/54 128/68    Blood Pressure (Exit) 110/62 124/60 140/64 128/60    Heart Rate (Admit) 80 bpm 86 bpm 86 bpm 83 bpm    Heart Rate (Exercise) 120 bpm 110 bpm 103 bpm 105 bpm    Heart Rate (Exit) 85 bpm 95 bpm 87 bpm  95 bpm    Oxygen  Saturation (Admit) 98 % 98 % 99 % 95 %    Oxygen  Saturation (Exercise) 92 % 94 % 95 % 93 %    Oxygen  Saturation (Exit) 98 % 97 % 96 % 92 %    Rating of Perceived Exertion (Exercise) 15 15 15 17     Perceived Dyspnea (Exercise) 2 2 2 2     Symptoms none none none none    Duration Progress to 30 minutes of  aerobic without signs/symptoms of physical distress Progress to 30 minutes of  aerobic without signs/symptoms of physical distress Progress to 30 minutes of  aerobic without signs/symptoms of physical distress Progress to 30 minutes of  aerobic without signs/symptoms of physical distress    Intensity THRR unchanged THRR unchanged THRR unchanged THRR unchanged      Progression   Progression Continue to progress workloads to maintain intensity without signs/symptoms of physical distress. Continue to progress workloads to maintain intensity without signs/symptoms of physical distress. Continue to progress workloads to maintain intensity without signs/symptoms of physical distress. Continue to progress workloads to maintain intensity without signs/symptoms of physical distress.    Average METs 2.5 2.46 2.4 2.16      Resistance Training   Training Prescription Yes Yes -- --    Weight 2 lb 2 lb 2 lb 2 lb    Reps 10-15 10-15 10-15 10-15      Interval Training   Interval Training No No No No      Oxygen    Oxygen  Continuous Continuous Continuous Continuous    Liters 2 2 2  2       Recumbant Bike   Level 2 -- 2 --    Watts 16 -- 18 --    Minutes 15 -- 15 --    METs 3.25 -- 3.2 --      NuStep   Level 3 2 2 2     Minutes 15 15 15 15     METs 2.3 2.8 2.8 2.3      T5 Nustep   Level 1 -- 2 2  T6    Minutes 15 -- 15 15    METs 1.9 -- 1.9 2.2      Biostep-RELP   Level -- -- 1 3    Minutes -- -- 15 15    METs -- -- 2 --      Track   Laps 30 30 25 25     Minutes 15 15 15 15     METs 2.63 2.63 2.36 2.36      Home Exercise Plan   Plans to continue exercise at Home (comment)  Marieelena currently is walking around her house as well as using hand weights on days she does not attend the program. Home (comment)  Traniyah currently is walking around her house as well as using hand weights on days she does not attend the program. Home (comment)  Kimbria currently is walking around her house as well as using hand weights on days she does not attend the program. Home (comment)  Keely currently is walking around her house as well as using hand weights on days she does not attend the program.    Frequency Add 2 additional days to program exercise sessions. Add 2 additional days to program exercise sessions. Add 2 additional days to program exercise sessions. Add 2 additional days to program exercise sessions.    Initial Home Exercises Provided 11/08/23 11/08/23 11/08/23 11/08/23      Oxygen    Maintain  Oxygen  Saturation 88% or higher 88% or higher 88% or higher 88% or higher       Exercise Comments:   Exercise Comments     Row Name 10/15/23 1412 01/30/24 1412         Exercise Comments First full day of exercise!  Patient was oriented to gym and equipment including functions, settings, policies, and procedures.  Patient's individual exercise prescription and treatment plan were reviewed.  All starting workloads were established based on the results of the 6 minute walk test done at initial orientation visit.  The plan for exercise progression was also introduced and  progression will be customized based on patient's performance and goals. Ahnesti graduated today from  rehab with 36 sessions completed.  Details of the patient's exercise prescription and what She needs to do in order to continue the prescription and progress were discussed with patient.  Patient was given a copy of prescription and goals.  Patient verbalized understanding. Allisha plans to continue to exercise by walking at home and using hand weights. She may also check into the [facility] and the [facility].         Exercise Goals and Review:   Exercise Goals     Row Name 10/09/23 1520             Exercise Goals   Increase Physical Activity Yes       Intervention Provide advice, education, support and counseling about physical activity/exercise needs.;Develop an individualized exercise prescription for aerobic and resistive training based on initial evaluation findings, risk stratification, comorbidities and participant's personal goals.       Expected Outcomes Short Term: Attend rehab on a regular basis to increase amount of physical activity.;Long Term: Add in home exercise to make exercise part of routine and to increase amount of physical activity.;Long Term: Exercising regularly at least 3-5 days a week.       Increase Strength and Stamina Yes       Intervention Provide advice, education, support and counseling about physical activity/exercise needs.;Develop an individualized exercise prescription for aerobic and resistive training based on initial evaluation findings, risk stratification, comorbidities and participant's personal goals.       Expected Outcomes Short Term: Increase workloads from initial exercise prescription for resistance, speed, and METs.;Short Term: Perform resistance training exercises routinely during rehab and add in resistance training at home;Long Term: Improve cardiorespiratory fitness, muscular endurance and strength as measured by increased METs and  functional capacity ( )       Able to understand and use rate of perceived exertion (RPE) scale Yes       Intervention Provide education and explanation on how to use RPE scale       Expected Outcomes Short Term: Able to use RPE daily in rehab to express subjective intensity level;Long Term:  Able to use RPE to guide intensity level when exercising independently       Able to understand and use Dyspnea scale Yes       Intervention Provide education and explanation on how to use Dyspnea scale       Expected Outcomes Short Term: Able to use Dyspnea scale daily in rehab to express subjective sense of shortness of breath during exertion;Long Term: Able to use Dyspnea scale to guide intensity level when exercising independently       Knowledge and understanding of Target Heart Rate Range (THRR) Yes       Intervention Provide education and explanation of THRR including how the  numbers were predicted and where they are located for reference       Expected Outcomes Short Term: Able to state/look up THRR;Short Term: Able to use daily as guideline for intensity in rehab;Long Term: Able to use THRR to govern intensity when exercising independently       Able to check pulse independently Yes       Intervention Provide education and demonstration on how to check pulse in carotid and radial arteries.;Review the importance of being able to check your own pulse for safety during independent exercise       Expected Outcomes Long Term: Able to check pulse independently and accurately;Short Term: Able to explain why pulse checking is important during independent exercise       Understanding of Exercise Prescription Yes       Intervention Provide education, explanation, and written materials on patient's individual exercise prescription       Expected Outcomes Short Term: Able to explain program exercise prescription;Long Term: Able to explain home exercise prescription to exercise independently          Exercise  Goals Re-Evaluation :  Exercise Goals Re-Evaluation     Row Name 10/15/23 1412 10/25/23 0841 11/05/23 1138 11/08/23 1433 11/22/23 1512     Exercise Goal Re-Evaluation   Exercise Goals Review Increase Physical Activity;Able to understand and use rate of perceived exertion (RPE) scale;Knowledge and understanding of Target Heart Rate Range (THRR);Understanding of Exercise Prescription;Able to check pulse independently;Increase Strength and Stamina;Able to understand and use Dyspnea scale Increase Physical Activity;Increase Strength and Stamina;Understanding of Exercise Prescription Increase Physical Activity;Increase Strength and Stamina;Understanding of Exercise Prescription Increase Physical Activity;Able to understand and use Dyspnea scale;Understanding of Exercise Prescription;Knowledge and understanding of Target Heart Rate Range (THRR);Increase Strength and Stamina;Able to understand and use rate of perceived exertion (RPE) scale;Able to check pulse independently Increase Physical Activity;Understanding of Exercise Prescription;Increase Strength and Stamina   Comments Reviewed RPE and dyspnea scale, THR and program prescription with pt today.  Pt voiced understanding and was given a copy of goals to take home. Luverne is off to a good start in the program, and was able to attend her first few sessions during this review period. During these sessions she was able to use the T5 nustep, and biostep at level 2, as well as walk 8 laps along the hallway. We will continue to monitor her progress in the program. Chenita is doing well in rehab. She was recently able to increase from 8 to 26 track laps in 20 minutes. She was also able to increase to level 2 on both the T4 nustep and recumbent bike. We will continue to monitor her progress in the program. Reviewed home exercise with pt today from 2:00 to 2:15.  Pt plans to continue to use hand weight exercises as well as walking around her house for exercise.   Reviewed THR, pulse, RPE, sign and symptoms, pulse oximetery and when to call 911 or MD.  Also discussed weather considerations and indoor options.  Pt voiced understanding. Andilyn continues to do well in rehab. She increased to level 4 on the T4 nustep and increased to 2lb handweights. She maintained level 2 on the recumbent bike, level 1 on the T5 nustep, and 26 laps on the track. We will continue to monitor her progress in the program.   Expected Outcomes Short: Use RPE daily to regulate intensity.  Long: Follow program prescription in THR. Short: Continue to follow exercise prescription. Long: Continue exercise to improve  strength and stamina. Short: Continue to follow exercise prescription, increase workloads when able. Long: Continue exercise to improve strength and stamina. Short: Continue to exercise at home as well as in the program. Long: Continue exercise plan to improve strength and stamina. Short: Try level 3 on the recumbent bike. Long: Continue exercise to improve strength and stamina.    Row Name 12/10/23 9157 12/10/23 1508 12/19/23 1149 01/07/24 0839 01/21/24 1531     Exercise Goal Re-Evaluation   Exercise Goals Review Increase Physical Activity;Understanding of Exercise Prescription;Increase Strength and Stamina Increase Physical Activity;Understanding of Exercise Prescription;Increase Strength and Stamina Increase Physical Activity;Understanding of Exercise Prescription;Increase Strength and Stamina Increase Physical Activity;Understanding of Exercise Prescription;Increase Strength and Stamina Increase Physical Activity;Increase Strength and Stamina   Comments Tameaka is doing well in rehab. She was able to increase her laps on the track from 26 to 30, and was able to maintain level 2 on the recumbent bike. We will continue to monitor her progress in the program. Siedah is doing well with her home exercise. She is doing weights at home with a little bit of resistance in 2 water  bottles.  She is also walking regularly outside for her home exercise. She states that she has more difficulty with the walking as it starts to get colder and we discussed finding an indoor option so that she can continue to walk. Hanaan continues to do well in rehab. She was able to maintain walking 30 laps on the track. She worked at level 2 on the T4 nustep. We will continue to monitor her progress in the program. Vayda is doing well in rehab. She was recently able to increase from level 1 to 2 on the T5 nustep. She was also able to walk 25 laps on the track. We will continue to monitor her progress in the program. Jilliana will be graduating rehab soon and has 3 visits left to complete. She understands how to monitor her oxygen  and when to take breaks to maximize her exercise sessions. She enjoys rehab and wants to continue getting out on a scheduled basis. She was encouraged to explore opportunities at the [facility] and information was provided.   Expected Outcomes Short: Try to increase to level 2 on the T4 nustep. Long: Continue exercise to improve strength and stamina. Short: Continue home exercise routine and find an indoor walking option. Long: Continue to exercise independently. Short: Increase to level 3 on T4 nustep. Long: Continue to exercise independently. Short: Increase to level 3 on T4 nustep. Long: Continue to exercise independently. ST: Complete remaining sessions of rehab and [facility] LT: Continue exercising and increasing stamina after graduation from rehab.    Row Name 01/23/24 1617             Exercise Goal Re-Evaluation   Exercise Goals Review Increase Physical Activity;Understanding of Exercise Prescription;Increase Strength and Stamina       Comments Myelle continues to do well in rehab. She increased to level 3 on the biostep. She maintained level 2 on the T4 and T6 nustep. She also maintained 25 laps on the track. We will continue to  monitor her progress in the program until graduation.       Expected Outcomes Short: Continue to progressively increase nustep workload. Long: Continue exercise to improve strength and stamina.          Discharge Exercise Prescription (Final Exercise Prescription Changes):  Exercise Prescription Changes - 01/23/24 1600  Response to Exercise   Blood Pressure (Admit) 128/68    Blood Pressure (Exit) 128/60    Heart Rate (Admit) 83 bpm    Heart Rate (Exercise) 105 bpm    Heart Rate (Exit) 95 bpm    Oxygen  Saturation (Admit) 95 %    Oxygen  Saturation (Exercise) 93 %    Oxygen  Saturation (Exit) 92 %    Rating of Perceived Exertion (Exercise) 17    Perceived Dyspnea (Exercise) 2    Symptoms none    Duration Progress to 30 minutes of  aerobic without signs/symptoms of physical distress    Intensity THRR unchanged      Progression   Progression Continue to progress workloads to maintain intensity without signs/symptoms of physical distress.    Average METs 2.16      Resistance Training   Weight 2 lb    Reps 10-15      Interval Training   Interval Training No      Oxygen    Oxygen  Continuous    Liters 2      NuStep   Level 2    Minutes 15    METs 2.3      T5 Nustep   Level 2   T6   Minutes 15    METs 2.2      Biostep-RELP   Level 3    Minutes 15      Track   Laps 25    Minutes 15    METs 2.36      Home Exercise Plan   Plans to continue exercise at Home (comment)   Lexus currently is walking around her house as well as using hand weights on days she does not attend the program.   Frequency Add 2 additional days to program exercise sessions.    Initial Home Exercises Provided 11/08/23      Oxygen    Maintain Oxygen  Saturation 88% or higher          Nutrition:  Target Goals: Understanding of nutrition guidelines, daily intake of sodium 1500mg , cholesterol 200mg , calories 30% from fat and 7% or less from saturated fats, daily to have 5 or more servings  of fruits and vegetables.  Education: Nutrition 1 -Group instruction provided by verbal, written material, interactive activities, discussions, models, and posters to present general guidelines for heart healthy nutrition including macronutrients, label reading, and promoting whole foods over processed counterparts. Education serves as pensions consultant of discussion of heart healthy eating for all. Written material provided at class time. Flowsheet Row Pulmonary Rehab from 01/09/2024 in [facility] Cardiac and Pulmonary Rehab  Date 10/31/23  Educator jg  Instruction Review Code 1- Verbalizes Understanding      Education: Nutrition 2 -Group instruction provided by verbal, written material, interactive activities, discussions, models, and posters to present general guidelines for heart healthy nutrition including sodium, cholesterol, and saturated fat. Providing guidance of habit forming to improve blood pressure, cholesterol, and body weight. Written material provided at class time. Flowsheet Row Pulmonary Rehab from 01/09/2024 in [facility] Cardiac and Pulmonary Rehab  Date 11/07/23  Educator jg  Instruction Review Code 1- Verbalizes Understanding      Biometrics:  Pre Biometrics - 10/09/23 1520       Pre Biometrics   Height 5' 0.6 (1.539 m)    Weight 102 lb 11.2 oz (46.6 kg)    Waist Circumference 29 inches    Hip Circumference 35 inches    Waist to Hip Ratio 0.83 %    BMI (Calculated) 19.67  Single Leg Stand 0 seconds          Post Biometrics - 01/16/24 1412        Post  Biometrics   Height 5' 0.6 (1.539 m)    Weight 107 lb 3.2 oz (48.6 kg)    Waist Circumference 31 inches    Hip Circumference 36 inches    Waist to Hip Ratio 0.86 %    BMI (Calculated) 20.53    Single Leg Stand 1.34 seconds          Nutrition Therapy Plan and Nutrition Goals:  Nutrition Therapy & Goals - 01/21/24 1526       Nutrition Therapy   RD appointment deferred Yes          Nutrition  Assessments:  MEDIFICTS Score Key: >=70 Need to make dietary changes  40-70 Heart Healthy Diet <= 40 Therapeutic Level Cholesterol Diet  Flowsheet Row Pulmonary Rehab from 01/21/2024 in [facility] Cardiac and Pulmonary Rehab  Picture Your Plate Total Score on Admission 54  Picture Your Plate Total Score on Discharge 62   Picture Your Plate Scores: <59 Unhealthy dietary pattern with much room for improvement. 41-50 Dietary pattern unlikely to meet recommendations for good health and room for improvement. 51-60 More healthful dietary pattern, with some room for improvement.  >60 Healthy dietary pattern, although there may be some specific behaviors that could be improved.   Nutrition Goals Re-Evaluation:  Nutrition Goals Re-Evaluation     Row Name 10/25/23 1425 11/15/23 1412 12/19/23 1417 01/21/24 1436       Goals   Comment Patient was informed on why it is important to maintain a balanced diet when dealing with Respiratory issues. Explained that it takes a lot of energy to breath and when they are short of breath often they will need to have a good diet to help keep up with the calories they are expending for breathing. Patient would like to meet the RD. Patient stated that she attended the group nutrition lecture and has the information she needs for now and will defer RD apt. Caitlan states that she has less concerns with her weight as she is gaining and the information provided during nutrition education class was helpful. No further education needed or requested and deferred RD appointment.    Expected Outcome Short: Choose and plan snacks accordingly to patients caloric intake to improve breathing. Long: Maintain a diet independently that meets their caloric intake to aid in daily shortness of breath. Short: meet with RD. Long: adhere to a diet that pertains to her. -- ST: Continue to use information provided to have healthy weight gain. No recognized barriers for discharge.  LT: Continue to  weigh self weekly and monitor diet to ensure healthy intake to increase weight as desired.       Nutrition Goals Discharge (Final Nutrition Goals Re-Evaluation):  Nutrition Goals Re-Evaluation - 01/21/24 1436       Goals   Comment Yassmine states that she has less concerns with her weight as she is gaining and the information provided during nutrition education class was helpful. No further education needed or requested and deferred RD appointment.    Expected Outcome ST: Continue to use information provided to have healthy weight gain. No recognized barriers for discharge.  LT: Continue to weigh self weekly and monitor diet to ensure healthy intake to increase weight as desired.          Psychosocial: Target Goals: Acknowledge presence or absence of significant depression and/or  stress, maximize coping skills, provide positive support system. Participant is able to verbalize types and ability to use techniques and skills needed for reducing stress and depression.   Education: Stress, Anxiety, and Depression - Group verbal and visual presentation to define topics covered.  Reviews how body is impacted by stress, anxiety, and depression.  Also discusses healthy ways to reduce stress and to treat/manage anxiety and depression.  Written material provided at class time.   Education: Sleep Hygiene -Provides group verbal and written instruction about how sleep can affect your health.  Define sleep hygiene, discuss sleep cycles and impact of sleep habits. Review good sleep hygiene tips.    Initial Review & Psychosocial Screening:  Initial Psych Review & Screening - 10/03/23 1426       Initial Review   Current issues with Current Psychotropic Meds;Current Anxiety/Panic      Family Dynamics   Good Support System? Yes    Comments Caeleigh lives with her son and takes care of her if he needs to. She states she has anxiety at times and does not know where it stems from.      Barriers    Psychosocial barriers to participate in program The patient should benefit from training in stress management and relaxation.;Psychosocial barriers identified (see note)      Screening Interventions   Interventions Provide feedback about the scores to participant;To provide support and resources with identified psychosocial needs;Encouraged to exercise    Expected Outcomes Short Term goal: Utilizing psychosocial counselor, staff and physician to assist with identification of specific Stressors or current issues interfering with healing process. Setting desired goal for each stressor or current issue identified.;Long Term Goal: Stressors or current issues are controlled or eliminated.;Short Term goal: Identification and review with participant of any Quality of Life or Depression concerns found by scoring the questionnaire.;Long Term goal: The participant improves quality of Life and PHQ9 Scores as seen by post scores and/or verbalization of changes          Quality of Life Scores:  Scores of 19 and below usually indicate a poorer quality of life in these areas.  A difference of  2-3 points is a clinically meaningful difference.  A difference of 2-3 points in the total score of the Quality of Life Index has been associated with significant improvement in overall quality of life, self-image, physical symptoms, and general health in studies assessing change in quality of life.  PHQ-9: Review Flowsheet       01/21/2024 10/25/2023 10/09/2023  Depression screen PHQ 2/9  Decreased Interest 0 0 0  Down, Depressed, Hopeless 0 0 2  PHQ - 2 Score 0 0 2  Altered sleeping 0 1 1  Tired, decreased energy 0 1 3  Change in appetite 0 0 0  Feeling bad or failure about yourself  0 0 0  Trouble concentrating 0 1 1  Moving slowly or fidgety/restless 0 0 0  Suicidal thoughts 0 0 0  PHQ-9 Score 0 3  7   Difficult doing work/chores - Somewhat difficult Somewhat difficult    Details       Data saved with a  previous flowsheet row definition        Interpretation of Total Score  Total Score Depression Severity:  1-4 = Minimal depression, 5-9 = Mild depression, 10-14 = Moderate depression, 15-19 = Moderately severe depression, 20-27 = Severe depression   Psychosocial Evaluation and Intervention:  Psychosocial Evaluation - 01/21/24 1431  Psychosocial Evaluation & Interventions   Interventions --    Comments --    Expected Outcomes --    Continue Psychosocial Services  --          Psychosocial Re-Evaluation:  Psychosocial Re-Evaluation     Row Name 10/25/23 1432 11/15/23 1424 12/19/23 1426 01/21/24 1434       Psychosocial Re-Evaluation   Current issues with Current Stress Concerns;Current Anxiety/Panic Current Anxiety/Panic Current Anxiety/Panic Current Anxiety/Panic;Current Stress Concerns    Comments Reviewed patient health questionnaire (PHQ-9) with patient for follow up. Previously, patients score indicated signs/symptoms of depression.  Reviewed to see if patient is improving symptom wise while in program.  Score improved and patient states that it is because she has been able to exercise more. Patient states that she has some anxiety. She takes medication for her anxiety. She states that her son has alot of health issues and that he lives with her. Patient states that she does not sleep good at night because of her legs being restless. She does take medicaiton for this. She states no new stress or anxiety aside from missing reletives during the holidays but she is grateful for the memories she has with them. Metha lives with her son and he helps take care of her as needed. Zenia states that she continues to drive and likes to get out of the house for rehab. She states that she continues to take her anxiety medication and that it is working well to manage her stress and anxiety. She also will watch movies to help manage her stress and relax. Milli states she continues to feel  safe in her home and is interested in exploring the [employer] for continued outings.    Expected Outcomes Short: Continue to attend [facility] regularly for regular exercise and social engagement. Long: Continue to improve symptoms and manage a positive mental state. Short: attened [facility] to keep stress at a minimum. Long: take medication as prescribed to help mood. Short: attened [facility] to keep stress at a minimum. Long: take medication as prescribed to help mood. ST: No barriers to patient graduating rehab as she continues her medication that is effective for anxiety and stress. LT: Patient will continue to take medications as prescribed and reach out to MD as needed.    Interventions Encouraged to attend Pulmonary Rehabilitation for the exercise Encouraged to attend Pulmonary Rehabilitation for the exercise Encouraged to attend Pulmonary Rehabilitation for the exercise Relaxation education;Stress management education    Continue Psychosocial Services  Follow up required by staff Follow up required by staff Follow up required by staff No Follow up required       Psychosocial Discharge (Final Psychosocial Re-Evaluation):  Psychosocial Re-Evaluation - 01/21/24 1434       Psychosocial Re-Evaluation   Current issues with Current Anxiety/Panic;Current Stress Concerns    Comments Emillia lives with her son and he helps take care of her as needed. Santasia states that she continues to drive and likes to get out of the house for rehab. She states that she continues to take her anxiety medication and that it is working well to manage her stress and anxiety. She also will watch movies to help manage her stress and relax. Marva states she continues to feel safe in her home and is interested in exploring the [employer] for continued outings.    Expected Outcomes ST: No barriers to patient graduating rehab as she continues her medication that is effective for anxiety and stress. LT: Patient will  continue to take medications as prescribed and reach out to MD as needed.    Interventions Relaxation education;Stress management education    Continue Psychosocial Services  No Follow up required          Education: Education Goals: Education classes will be provided on a weekly basis, covering required topics. Participant will state understanding/return demonstration of topics presented.  Learning Barriers/Preferences:  Learning Barriers/Preferences - 10/03/23 1424       Learning Barriers/Preferences   Learning Barriers Hearing    Learning Preferences None          General Pulmonary Education Topics:  Infection Prevention: - Provides verbal and written material to individual with discussion of infection control including proper hand washing and proper equipment cleaning during exercise session. Flowsheet Row Pulmonary Rehab from 01/09/2024 in [facility] Cardiac and Pulmonary Rehab  Date 10/09/23  Educator [facility]  Instruction Review Code 1- Verbalizes Understanding    Falls Prevention: - Provides verbal and written material to individual with discussion of falls prevention and safety. Flowsheet Row Pulmonary Rehab from 01/09/2024 in [facility] Cardiac and Pulmonary Rehab  Date 10/09/23  Educator [facility]  Instruction Review Code 1- Verbalizes Understanding    Chronic Lung Disease Review: - Group verbal instruction with posters, models, PowerPoint presentations and videos,  to review new updates, new respiratory medications, new advancements in procedures and treatments. Providing information on websites and 800 numbers for continued self-education. Includes information about supplement oxygen , available portable oxygen  systems, continuous and intermittent flow rates, oxygen  safety, concentrators, and Medicare reimbursement for oxygen . Explanation of Pulmonary Drugs, including class, frequency, complications, importance of spacers, rinsing mouth after steroid MDI's, and proper cleaning methods  for nebulizers. Review of basic lung anatomy and physiology related to function, structure, and complications of lung disease. Review of risk factors. Discussion about methods for diagnosing sleep apnea and types of masks and machines for OSA. Includes a review of the use of types of environmental controls: home humidity, furnaces, filters, dust mite/pet prevention, HEPA vacuums. Discussion about weather changes, air quality and the benefits of nasal washing. Instruction on Warning signs, infection symptoms, calling MD promptly, preventive modes, and value of vaccinations. Review of effective airway clearance, coughing and/or vibration techniques. Emphasizing that all should Create an Action Plan. Written material provided at class time. Flowsheet Row Pulmonary Rehab from 01/09/2024 in [facility] Cardiac and Pulmonary Rehab  Education need identified 10/09/23    AED/CPR: - Group verbal and written instruction with the use of models to demonstrate the basic use of the AED with the basic ABC's of resuscitation.    Tests and Procedures:  - Group verbal and visual presentation and models provide information about basic cardiac anatomy and function. Reviews the testing methods done to diagnose heart disease and the outcomes of the test results. Describes the treatment choices: Medical Management, Angioplasty, or Coronary Bypass Surgery for treating various heart conditions including Myocardial Infarction, Angina, Valve Disease, and Cardiac Arrhythmias.  Written material provided at class time.   Medication Safety: - Group verbal and visual instruction to review commonly prescribed medications for heart and lung disease. Reviews the medication, class of the drug, and side effects. Includes the steps to properly store meds and maintain the prescription regimen.  Written material given at graduation. Flowsheet Row Pulmonary Rehab from 01/09/2024 in [facility] Cardiac and Pulmonary Rehab  Date 11/21/23  Educator [facility]   Instruction Review Code 1- Verbalizes Understanding    Other: -Provides group and verbal instruction on various topics (see comments)  Knowledge Questionnaire Score:  Knowledge Questionnaire Score - 01/21/24 1414       Knowledge Questionnaire Score   Pre Score 13/18    Post Score 16/18           Core Components/Risk Factors/Patient Goals at Admission:  Personal Goals and Risk Factors at Admission - 10/03/23 1424       Core Components/Risk Factors/Patient Goals on Admission    Weight Management Yes;Weight Gain    Intervention Weight Management: Develop a combined nutrition and exercise program designed to reach desired caloric intake, while maintaining appropriate intake of nutrient and fiber, sodium and fats, and appropriate energy expenditure required for the weight goal.;Weight Management: Provide education and appropriate resources to help participant work on and attain dietary goals.;Weight Management/Obesity: Establish reasonable short term and long term weight goals.    Expected Outcomes Short Term: Continue to assess and modify interventions until short term weight is achieved;Weight Maintenance: Understanding of the daily nutrition guidelines, which includes 25-35% calories from fat, 7% or less cal from saturated fats, less than 200mg  cholesterol, less than 1.5gm of sodium, & 5 or more servings of fruits and vegetables daily;Understanding recommendations for meals to include 15-35% energy as protein, 25-35% energy from fat, 35-60% energy from carbohydrates, less than 200mg  of dietary cholesterol, 20-35 gm of total fiber daily;Understanding of distribution of calorie intake throughout the day with the consumption of 4-5 meals/snacks;Weight Gain: Understanding of general recommendations for a high calorie, high protein meal plan that promotes weight gain by distributing calorie intake throughout the day with the consumption for 4-5 meals, snacks, and/or supplements    Improve  shortness of breath with ADL's Yes    Intervention Provide education, individualized exercise plan and daily activity instruction to help decrease symptoms of SOB with activities of daily living.    Expected Outcomes Short Term: Improve cardiorespiratory fitness to achieve a reduction of symptoms when performing ADLs;Long Term: Be able to perform more ADLs without symptoms or delay the onset of symptoms    Hypertension Yes    Intervention Provide education on lifestyle modifcations including regular physical activity/exercise, weight management, moderate sodium restriction and increased consumption of fresh fruit, vegetables, and low fat dairy, alcohol moderation, and smoking cessation.;Monitor prescription use compliance.    Expected Outcomes Short Term: Continued assessment and intervention until BP is < 140/25mm HG in hypertensive participants. < 130/42mm HG in hypertensive participants with diabetes, heart failure or chronic kidney disease.;Long Term: Maintenance of blood pressure at goal levels.    Lipids Yes    Intervention Provide education and support for participant on nutrition & aerobic/resistive exercise along with prescribed medications to achieve LDL 70mg , HDL >40mg .    Expected Outcomes Short Term: Participant states understanding of desired cholesterol values and is compliant with medications prescribed. Participant is following exercise prescription and nutrition guidelines.;Long Term: Cholesterol controlled with medications as prescribed, with individualized exercise RX and with personalized nutrition plan. Value goals: LDL < 70mg , HDL > 40 mg.          Education:Diabetes - Individual verbal and written instruction to review signs/symptoms of diabetes, desired ranges of glucose level fasting, after meals and with exercise. Acknowledge that pre and post exercise glucose checks will be done for 3 sessions at entry of program.   Know Your Numbers and Heart Failure: - Group verbal  and visual instruction to discuss disease risk factors for cardiac and pulmonary disease and treatment options.  Reviews associated critical values for Overweight/Obesity, Hypertension, Cholesterol, and Diabetes.  Discusses basics of heart failure: signs/symptoms and treatments.  Introduces Heart Failure Zone chart for action plan for heart failure. Written material provided at class time. Flowsheet Row Pulmonary Rehab from 01/09/2024 in [facility] Cardiac and Pulmonary Rehab  Date 12/05/23  Educator [facility]  Instruction Review Code 1- Verbalizes Understanding    Core Components/Risk Factors/Patient Goals Review:   Goals and Risk Factor Review     Row Name 10/25/23 1425 11/15/23 1419 12/19/23 1422 01/21/24 1425       Core Components/Risk Factors/Patient Goals Review   Personal Goals Review Improve shortness of breath with ADL's Hypertension Weight Management/Obesity;Lipids;Hypertension;Improve shortness of breath with ADL's Improve shortness of breath with ADL's;Hypertension;Lipids;Stress;Other  desires to gain weight    Review Spoke to patient about their shortness of breath and what they can do to improve. Patient has been informed of breathing techniques when starting the program. Patient is informed to tell staff if they have had any med changes and that certain meds they are taking or not taking can be causing shortness of breath. Check patients wrist blood pressure cuff agains our manual pressure. Her cuff was off by 30 points systolic. She states that she will not use that one and try a different option. Lianni reports that she does take all BP and cholesterol meds. She has not been taking BP at home and states that the one she has is not accurate. She was encouraged to buy a new one is she is able and to start checking BP at home. She reports that her weight has been steady but she continues to want to try to work towards gaining weight. She states that she is comfortable using PLB to help control SOB.  Erandy reports that she is compliant with her BP, cholesterol, anxiety, and respiratory medications. Shacoria was encouraged to reach out to her insurance company as some will provide a BP cuff for home use as she has not replaced her defective one as of yet. Pinkey will also be exploring options to purchase or secure a pulse oximeter. Shama reports that she is pleased with her weight as she has been gaining a little. She stated that the nutrition education class was helpful and has helped her make better food choices. Kamilah deferred a RD discussion/appointment.    Expected Outcomes Short: Attend [facility] regularly to improve shortness of breath with ADL's. Long: maintain independence with ADL's Short: monitor blood pressure at home. Long: check blood pressure at home independently. Short: start monitoring BP at home. Long: control cardiac risk factors. ST: Contact insurance company to determine if BP cuff available/secure a BP cuff for home use; graduating rehab very soon. LT: Monitor BP post graduation of rehab program. Continue to eat a healthy diet to ensure healthy weight gain. Review schedule at [facility] to continue outings and exercise to manage stress and improve mood.       Core Components/Risk Factors/Patient Goals at Discharge (Final Review):   Goals and Risk Factor Review - 01/21/24 1425       Core Components/Risk Factors/Patient Goals Review   Personal Goals Review Improve shortness of breath with ADL's;Hypertension;Lipids;Stress;Other   desires to gain weight   Review Katie reports that she is compliant with her BP, cholesterol, anxiety, and respiratory medications. Gethsemane was encouraged to reach out to her insurance company as some will provide a BP cuff for home use as she has not replaced her defective one as of yet. Lavonya will also be exploring options to purchase or secure a  pulse oximeter. Robbie reports that she is pleased with her weight as she has been gaining a  little. She stated that the nutrition education class was helpful and has helped her make better food choices. Lynsi deferred a RD discussion/appointment.    Expected Outcomes ST: Contact insurance company to determine if BP cuff available/secure a BP cuff for home use; graduating rehab very soon. LT: Monitor BP post graduation of rehab program. Continue to eat a healthy diet to ensure healthy weight gain. Review schedule at [facility] to continue outings and exercise to manage stress and improve mood.          ITP Comments:  ITP Comments     Row Name 10/03/23 1428 10/09/23 1512 10/15/23 1412 10/31/23 0945 11/28/23 1255   ITP Comments Virtual Visit completed. Patient informed on EP and RD appointment and 6 Minute walk test. Patient also informed of patient health questionnaires on My Chart. Patient Verbalizes understanding. Visit diagnosis can be found in [facility] 09/13/2023. Completed and gym orientation for pulmonary rehab. Initial ITP created and sent for review to Dr. Fuad Aleskerov, Medical Director. First full day of exercise!  Patient was oriented to gym and equipment including functions, settings, policies, and procedures.  Patient's individual exercise prescription and treatment plan were reviewed.  All starting workloads were established based on the results of the 6 minute walk test done at initial orientation visit.  The plan for exercise progression was also introduced and progression will be customized based on patient's performance and goals. 30 Day review completed. Medical Director ITP review done; changes made as directed and signed approval by Medical Director. 30 Day review completed. Medical Director ITP review done, changes made as directed, and signed approval by Medical Director.    Row Name 12/26/23 0919 01/23/24 1053 01/30/24 1412       ITP Comments 30 Day review completed. Medical Director ITP review done, changes made as directed, and signed approval by Medical  Director. 30 Day review completed. Medical Director ITP review done, changes made as directed, and signed approval by Medical Director. Isobel graduated today from  rehab with 36 sessions completed.  Details of the patient's exercise prescription and what She needs to do in order to continue the prescription and progress were discussed with patient.  Patient was given a copy of prescription and goals.  Patient verbalized understanding. Alvina plans to continue to exercise by walking at home and using hand weights. She may also check into the [facility] and the [facility].        Comments: discharge ITP     [1]  Current Outpatient Medications:    acetaminophen  (TYLENOL ) 500 MG tablet, Take 500 mg by mouth every 6 (six) hours as needed for mild pain, moderate pain, fever or headache. , Disp: , Rfl:    albuterol  (PROVENTIL  HFA;VENTOLIN  HFA) 108 (90 Base) MCG/ACT inhaler, Inhale 2 puffs into the lungs every 4 (four) hours as needed for wheezing or shortness of breath., Disp: , Rfl:    albuterol  (PROVENTIL ) (2.5 MG/3ML) 0.083% nebulizer solution, Take 2.5 mg by nebulization every 6 (six) hours as needed for wheezing or shortness of breath., Disp: , Rfl:    ALPRAZolam  (XANAX ) 0.5 MG tablet, Take 0.5 mg by mouth daily as needed. (Patient not taking: Reported on 10/03/2023), Disp: , Rfl:    ALPRAZolam  (XANAX ) 0.5 MG tablet, Take by mouth. (Patient not taking: Reported on 10/03/2023), Disp: , Rfl:    ALPRAZolam  (XANAX ) 0.5 MG tablet, Take  0.5 mg by mouth., Disp: , Rfl:    amLODipine (NORVASC) 2.5 MG tablet, Take 2.5 mg by mouth., Disp: , Rfl:    budesonide -formoterol  (SYMBICORT ) 160-4.5 MCG/ACT inhaler, Inhale 2 puffs into the lungs 2 (two) times daily., Disp: , Rfl:    cyanocobalamin  1000 MCG tablet, Take 1,000 mcg by mouth daily., Disp: , Rfl:    escitalopram  (LEXAPRO ) 10 MG tablet, Take 10 mg by mouth at bedtime. (Patient not taking: Reported on 10/03/2023), Disp: , Rfl: 0   fluticasone   (FLONASE ) 50 MCG/ACT nasal spray, Place 2 sprays into both nostrils at bedtime., Disp: , Rfl:    furosemide  (LASIX ) 20 MG tablet, Take 20 mg by mouth at bedtime. (Patient not taking: Reported on 10/03/2023), Disp: , Rfl:    gabapentin  (NEURONTIN ) 300 MG capsule, Take 300 mg by mouth at bedtime., Disp: , Rfl:    lisinopril  (PRINIVIL ,ZESTRIL ) 2.5 MG tablet, Take 2.5 mg by mouth at bedtime., Disp: , Rfl:    lisinopril  (ZESTRIL ) 2.5 MG tablet, Take 1 tablet by mouth daily. (Patient not taking: Reported on 10/03/2023), Disp: , Rfl:    loperamide (IMODIUM) 2 MG capsule, Take 2 mg by mouth as needed for diarrhea or loose stools., Disp: , Rfl:    metoprolol  succinate (TOPROL -XL) 25 MG 24 hr tablet, Take 25 mg by mouth daily. (Patient not taking: Reported on 10/03/2023), Disp: , Rfl:    OXYGEN , Inhale 2 L into the lungs continuous., Disp: , Rfl:    pantoprazole (PROTONIX) 40 MG tablet, Take 1 tablet by mouth at bedtime. (Patient not taking: Reported on 10/03/2023), Disp: , Rfl:    pantoprazole (PROTONIX) 40 MG tablet, Take 1 tablet by mouth daily., Disp: , Rfl:    simvastatin  (ZOCOR ) 20 MG tablet, Take 20 mg by mouth at bedtime. (Patient not taking: Reported on 10/03/2023), Disp: , Rfl:    SPIRIVA  RESPIMAT 2.5 MCG/ACT AERS, Inhale 2 puffs into the lungs every morning. (Patient not taking: Reported on 10/03/2023), Disp: , Rfl: 0   Tiotropium Bromide  Monohydrate (SPIRIVA  RESPIMAT) 2.5 MCG/ACT AERS, Inhale 5 mcg into the lungs., Disp: , Rfl:    traMADol  (ULTRAM ) 50 MG tablet, Take 50 mg by mouth 2 (two) times daily as needed for moderate pain. (Patient not taking: Reported on 10/03/2023), Disp: , Rfl:    traMADol  (ULTRAM ) 50 MG tablet, Take 50 mg by mouth., Disp: , Rfl:    Vitamin D , Ergocalciferol , (DRISDOL ) 50000 units CAPS capsule, Take 50,000 Units by mouth every 7 (seven) days. Pt takes on Saturday., Disp: , Rfl:  [2]  Social History Tobacco Use  Smoking Status Former   Current packs/day: 0.00   Average  packs/day: 1.5 packs/day for 52.5 years (78.8 ttl pk-yrs)   Types: Cigarettes   Start date: 05/10/1941   Quit date: 11/09/1993   Years since quitting: 30.2  Smokeless Tobacco Never

## 2024-01-30 NOTE — Progress Notes (Signed)
 Daily Session Note  Patient Details  Name: Tanya Rhodes MRN: [id number] Date of Birth: 20-Sep-1940 Referring Provider:   Flowsheet Row Pulmonary Rehab from 10/09/2023 in [facility] Cardiac and Pulmonary Rehab  Referring Provider Dr. Lavelle Servant    Encounter Date: 01/30/2024  Check In:  Session Check In - 01/30/24 1408       Check-In   Supervising physician immediately available to respond to emergencies See telemetry face sheet for immediately available ER MD    Location [facility]-Cardiac & Pulmonary Rehab    Staff Present Burnard Davenport RN,BSN,MPA;Laura Cates RN,BSN;Joseph Rolinda NORWOOD HARMAN Verlie Laird, [place], Exercise Physiologist;Lanier Millon Dyane BS, ACSM CEP, Exercise Physiologist    Virtual Visit No    Medication changes reported     No    Fall or balance concerns reported    No    Tobacco Cessation No Change    Warm-up and Cool-down Performed on first and last piece of equipment    Resistance Training Performed Yes    VAD Patient? No    PAD/SET Patient? No      Pain Assessment   Currently in Pain? No/denies             Tobacco Use History[1]  Goals Met:  Proper associated with RPD/PD & O2 Sat Independence with exercise equipment Using PLB without cueing & demonstrates good technique Exercise tolerated well No report of concerns or symptoms today Strength training completed today  Goals Unmet:  Not Applicable  Comments:  [name] graduated today from  rehab with 36 sessions completed.  Details of the patient's exercise prescription and what She needs to do in order to continue the prescription and progress were discussed with patient.  Patient was given a copy of prescription and goals.  Patient verbalized understanding. [name] plans to continue to exercise by walking at home and using hand weights. She may also check into the [facility] and the [facility].     Dr. Oneil Pinal is Medical Director for [facility] Cardiac Rehabilitation.  Dr. Fuad Aleskerov is  Medical Director for [facility] Pulmonary Rehabilitation.     [1]  Social History Tobacco Use  Smoking Status Former   Current packs/day: 0.00   Average packs/day: 1.5 packs/day for 52.5 years (78.8 ttl pk-yrs)   Types: Cigarettes   Start date: 05/10/1941   Quit date: 11/09/1993   Years since quitting: 30.2  Smokeless Tobacco Never
